# Patient Record
Sex: Male | Born: 1950
Health system: Southern US, Community
[De-identification: ages and names within clinical notes are randomized; demographics above are authoritative.]

## PROBLEM LIST (undated history)

## (undated) DIAGNOSIS — K219 Gastro-esophageal reflux disease without esophagitis: Secondary | ICD-10-CM

## (undated) DIAGNOSIS — I82409 Acute embolism and thrombosis of unspecified deep veins of unspecified lower extremity: Secondary | ICD-10-CM

## (undated) DIAGNOSIS — D751 Secondary polycythemia: Secondary | ICD-10-CM

## (undated) DIAGNOSIS — D689 Coagulation defect, unspecified: Secondary | ICD-10-CM

## (undated) DIAGNOSIS — D649 Anemia, unspecified: Secondary | ICD-10-CM

## (undated) DIAGNOSIS — G248 Other dystonia: Secondary | ICD-10-CM

## (undated) DIAGNOSIS — D45 Polycythemia vera: Secondary | ICD-10-CM

## (undated) DIAGNOSIS — K255 Chronic or unspecified gastric ulcer with perforation: Secondary | ICD-10-CM

## (undated) DIAGNOSIS — A419 Sepsis, unspecified organism: Secondary | ICD-10-CM

## (undated) DIAGNOSIS — K635 Polyp of colon: Secondary | ICD-10-CM

## (undated) DIAGNOSIS — K631 Perforation of intestine (nontraumatic): Secondary | ICD-10-CM

## (undated) HISTORY — DX: Perforation of intestine (nontraumatic): K63.1

## (undated) HISTORY — PX: HERNIA REPAIR: SHX51

## (undated) HISTORY — DX: Polyp of colon: K63.5

## (undated) HISTORY — DX: Coagulation defect, unspecified: D68.9

## (undated) HISTORY — DX: Gastro-esophageal reflux disease without esophagitis: K21.9

## (undated) HISTORY — DX: Other dystonia: G24.8

## (undated) HISTORY — PX: COLONOSCOPY: SHX174

## (undated) HISTORY — DX: Chronic or unspecified gastric ulcer with perforation: K25.5

## (undated) HISTORY — PX: WISDOM TOOTH EXTRACTION: SHX21

## (undated) HISTORY — DX: Polycythemia vera: D45

## (undated) HISTORY — DX: Sepsis, unspecified organism: A41.9

---

## 1996-08-24 HISTORY — PX: FINGER ARTHROPLASTY: SHX5017

## 1998-01-24 ENCOUNTER — Other Ambulatory Visit: Admission: RE | Admit: 1998-01-24 | Discharge: 1998-01-24 | Payer: Self-pay | Admitting: Specialist

## 2004-07-24 ENCOUNTER — Ambulatory Visit: Payer: Self-pay | Admitting: Hematology & Oncology

## 2004-09-25 ENCOUNTER — Ambulatory Visit: Payer: Self-pay | Admitting: Hematology & Oncology

## 2004-11-27 ENCOUNTER — Ambulatory Visit: Payer: Self-pay | Admitting: Hematology & Oncology

## 2005-04-02 ENCOUNTER — Ambulatory Visit: Payer: Self-pay | Admitting: Hematology & Oncology

## 2005-05-18 ENCOUNTER — Ambulatory Visit: Payer: Self-pay | Admitting: Hematology & Oncology

## 2005-06-19 ENCOUNTER — Ambulatory Visit: Payer: Self-pay | Admitting: Internal Medicine

## 2005-07-14 ENCOUNTER — Encounter (INDEPENDENT_AMBULATORY_CARE_PROVIDER_SITE_OTHER): Payer: Self-pay | Admitting: *Deleted

## 2005-07-14 ENCOUNTER — Ambulatory Visit: Payer: Self-pay | Admitting: Internal Medicine

## 2005-07-21 ENCOUNTER — Ambulatory Visit: Payer: Self-pay | Admitting: Hematology & Oncology

## 2005-08-24 HISTORY — PX: INGUINAL HERNIA REPAIR: SHX194

## 2005-10-14 ENCOUNTER — Ambulatory Visit: Payer: Self-pay | Admitting: Hematology & Oncology

## 2005-12-01 ENCOUNTER — Ambulatory Visit: Payer: Self-pay | Admitting: Hematology & Oncology

## 2005-12-01 LAB — CBC WITH DIFFERENTIAL/PLATELET
BASO%: 0.6 % (ref 0.0–2.0)
Eosinophils Absolute: 0.3 10*3/uL (ref 0.0–0.5)
HCT: 44.3 % (ref 38.7–49.9)
LYMPH%: 8.6 % — ABNORMAL LOW (ref 14.0–48.0)
MONO#: 0.4 10*3/uL (ref 0.1–0.9)
NEUT#: 18.8 10*3/uL — ABNORMAL HIGH (ref 1.5–6.5)
NEUT%: 87.6 % — ABNORMAL HIGH (ref 40.0–75.0)
Platelets: 420 10*3/uL — ABNORMAL HIGH (ref 145–400)
WBC: 21.5 10*3/uL — ABNORMAL HIGH (ref 4.0–10.0)
lymph#: 1.8 10*3/uL (ref 0.9–3.3)

## 2006-01-14 ENCOUNTER — Ambulatory Visit: Payer: Self-pay | Admitting: Hematology & Oncology

## 2006-01-14 LAB — CBC WITH DIFFERENTIAL/PLATELET
Basophils Absolute: 0.1 10*3/uL (ref 0.0–0.1)
Eosinophils Absolute: 0.4 10*3/uL (ref 0.0–0.5)
HCT: 45.7 % (ref 38.7–49.9)
HGB: 14.1 g/dL (ref 13.0–17.1)
LYMPH%: 11.3 % — ABNORMAL LOW (ref 14.0–48.0)
MONO#: 0.4 10*3/uL (ref 0.1–0.9)
NEUT#: 14.3 10*3/uL — ABNORMAL HIGH (ref 1.5–6.5)
Platelets: 401 10*3/uL — ABNORMAL HIGH (ref 145–400)
RBC: 7.39 10*6/uL — ABNORMAL HIGH (ref 4.20–5.71)
WBC: 17.2 10*3/uL — ABNORMAL HIGH (ref 4.0–10.0)

## 2006-01-14 LAB — CHCC SMEAR

## 2006-01-22 LAB — CBC WITH DIFFERENTIAL/PLATELET
BASO%: 0.1 % (ref 0.0–2.0)
EOS%: 2 % (ref 0.0–7.0)
HGB: 14.3 g/dL (ref 13.0–17.1)
MCH: 19 pg — ABNORMAL LOW (ref 28.0–33.4)
MCHC: 30.7 g/dL — ABNORMAL LOW (ref 32.0–35.9)
MONO#: 0.4 10*3/uL (ref 0.1–0.9)
RDW: 20.3 % — ABNORMAL HIGH (ref 11.2–14.6)
WBC: 18 10*3/uL — ABNORMAL HIGH (ref 4.0–10.0)
lymph#: 1.7 10*3/uL (ref 0.9–3.3)

## 2006-01-28 LAB — CBC WITH DIFFERENTIAL/PLATELET
Basophils Absolute: 0.1 10*3/uL (ref 0.0–0.1)
EOS%: 1.7 % (ref 0.0–7.0)
Eosinophils Absolute: 0.4 10*3/uL (ref 0.0–0.5)
HCT: 41.5 % (ref 38.7–49.9)
HGB: 12.9 g/dL — ABNORMAL LOW (ref 13.0–17.1)
MONO#: 0.5 10*3/uL (ref 0.1–0.9)
NEUT#: 17.7 10*3/uL — ABNORMAL HIGH (ref 1.5–6.5)
RDW: 20.2 % — ABNORMAL HIGH (ref 11.2–14.6)
WBC: 20.4 10*3/uL — ABNORMAL HIGH (ref 4.0–10.0)
lymph#: 1.7 10*3/uL (ref 0.9–3.3)

## 2006-04-06 ENCOUNTER — Ambulatory Visit: Payer: Self-pay | Admitting: Hematology & Oncology

## 2006-04-08 LAB — CBC WITH DIFFERENTIAL/PLATELET
BASO%: 0.8 % (ref 0.0–2.0)
EOS%: 2.6 % (ref 0.0–7.0)
LYMPH%: 10.3 % — ABNORMAL LOW (ref 14.0–48.0)
MCH: 17.9 pg — ABNORMAL LOW (ref 28.0–33.4)
MCHC: 28.6 g/dL — ABNORMAL LOW (ref 32.0–35.9)
MCV: 62.8 fL — ABNORMAL LOW (ref 81.6–98.0)
MONO%: 3.9 % (ref 0.0–13.0)
Platelets: 455 10*3/uL — ABNORMAL HIGH (ref 145–400)
RBC: 7.26 10*6/uL — ABNORMAL HIGH (ref 4.20–5.71)
RDW: 15.5 % — ABNORMAL HIGH (ref 11.2–14.6)

## 2006-04-08 LAB — TECHNOLOGIST REVIEW

## 2006-04-16 LAB — CBC WITH DIFFERENTIAL/PLATELET
BASO%: 0.9 % (ref 0.0–2.0)
Basophils Absolute: 0.2 10*3/uL — ABNORMAL HIGH (ref 0.0–0.1)
EOS%: 2.5 % (ref 0.0–7.0)
HGB: 12.6 g/dL — ABNORMAL LOW (ref 13.0–17.1)
MCH: 18.1 pg — ABNORMAL LOW (ref 28.0–33.4)
RDW: 15.4 % — ABNORMAL HIGH (ref 11.2–14.6)
WBC: 19.1 10*3/uL — ABNORMAL HIGH (ref 4.0–10.0)
lymph#: 2.2 10*3/uL (ref 0.9–3.3)

## 2006-04-22 LAB — CBC WITH DIFFERENTIAL/PLATELET
Basophils Absolute: 0.2 10*3/uL — ABNORMAL HIGH (ref 0.0–0.1)
Eosinophils Absolute: 0.5 10*3/uL (ref 0.0–0.5)
HGB: 12 g/dL — ABNORMAL LOW (ref 13.0–17.1)
MCV: 61.7 fL — ABNORMAL LOW (ref 81.6–98.0)
NEUT#: 17.4 10*3/uL — ABNORMAL HIGH (ref 1.5–6.5)
RDW: 15.5 % — ABNORMAL HIGH (ref 11.2–14.6)
lymph#: 2.4 10*3/uL (ref 0.9–3.3)

## 2006-04-29 ENCOUNTER — Ambulatory Visit (HOSPITAL_COMMUNITY): Admission: RE | Admit: 2006-04-29 | Discharge: 2006-04-29 | Payer: Self-pay | Admitting: General Surgery

## 2006-04-30 ENCOUNTER — Encounter: Admission: RE | Admit: 2006-04-30 | Discharge: 2006-04-30 | Payer: Self-pay | Admitting: General Surgery

## 2006-05-20 LAB — CBC WITH DIFFERENTIAL/PLATELET
Basophils Absolute: 0 10*3/uL (ref 0.0–0.1)
Eosinophils Absolute: 0.4 10*3/uL (ref 0.0–0.5)
HGB: 12.2 g/dL — ABNORMAL LOW (ref 13.0–17.1)
LYMPH%: 8.9 % — ABNORMAL LOW (ref 14.0–48.0)
MCV: 60.8 fL — ABNORMAL LOW (ref 81.6–98.0)
MONO%: 2.6 % (ref 0.0–13.0)
NEUT#: 16.4 10*3/uL — ABNORMAL HIGH (ref 1.5–6.5)
Platelets: 465 10*3/uL — ABNORMAL HIGH (ref 145–400)

## 2006-05-20 LAB — FERRITIN: Ferritin: 4 ng/mL — ABNORMAL LOW (ref 22–322)

## 2006-06-23 ENCOUNTER — Ambulatory Visit (HOSPITAL_COMMUNITY): Admission: RE | Admit: 2006-06-23 | Discharge: 2006-06-23 | Payer: Self-pay | Admitting: General Surgery

## 2006-06-29 ENCOUNTER — Ambulatory Visit: Payer: Self-pay | Admitting: Hematology & Oncology

## 2006-07-01 LAB — CBC WITH DIFFERENTIAL/PLATELET
BASO%: 0.9 % (ref 0.0–2.0)
Basophils Absolute: 0.3 10*3/uL — ABNORMAL HIGH (ref 0.0–0.1)
EOS%: 2.3 % (ref 0.0–7.0)
Eosinophils Absolute: 0.6 10*3/uL — ABNORMAL HIGH (ref 0.0–0.5)
HCT: 45.4 % (ref 38.7–49.9)
HGB: 13.1 g/dL (ref 13.0–17.1)
LYMPH%: 8.6 % — ABNORMAL LOW (ref 14.0–48.0)
MCH: 17.7 pg — ABNORMAL LOW (ref 28.0–33.4)
MCHC: 28.8 g/dL — ABNORMAL LOW (ref 32.0–35.9)
MCV: 61.3 fL — ABNORMAL LOW (ref 81.6–98.0)
MONO#: 0.9 10*3/uL (ref 0.1–0.9)
MONO%: 3.1 % (ref 0.0–13.0)
NEUT#: 23.5 10*3/uL — ABNORMAL HIGH (ref 1.5–6.5)
NEUT%: 85.2 % — ABNORMAL HIGH (ref 40.0–75.0)
Platelets: 656 10*3/uL — ABNORMAL HIGH (ref 145–400)
RBC: 7.4 10*6/uL — ABNORMAL HIGH (ref 4.20–5.71)
RDW: 15.6 % — ABNORMAL HIGH (ref 11.2–14.6)
WBC: 27.6 10*3/uL — ABNORMAL HIGH (ref 4.0–10.0)
lymph#: 2.4 10*3/uL (ref 0.9–3.3)

## 2006-08-12 LAB — CBC WITH DIFFERENTIAL/PLATELET
Basophils Absolute: 0 10*3/uL (ref 0.0–0.1)
EOS%: 1.9 % (ref 0.0–7.0)
Eosinophils Absolute: 0.4 10*3/uL (ref 0.0–0.5)
HCT: 43.1 % (ref 38.7–49.9)
HGB: 12.8 g/dL — ABNORMAL LOW (ref 13.0–17.1)
MCH: 18.1 pg — ABNORMAL LOW (ref 28.0–33.4)
MCV: 60.9 fL — ABNORMAL LOW (ref 81.6–98.0)
MONO%: 2.3 % (ref 0.0–13.0)
NEUT#: 16.2 10*3/uL — ABNORMAL HIGH (ref 1.5–6.5)
NEUT%: 85.3 % — ABNORMAL HIGH (ref 40.0–75.0)
RDW: 20.2 % — ABNORMAL HIGH (ref 11.2–14.6)

## 2006-09-20 ENCOUNTER — Ambulatory Visit: Payer: Self-pay | Admitting: Hematology & Oncology

## 2006-09-23 LAB — CBC WITH DIFFERENTIAL/PLATELET
Basophils Absolute: 0 10*3/uL (ref 0.0–0.1)
EOS%: 1.4 % (ref 0.0–7.0)
Eosinophils Absolute: 0.2 10*3/uL (ref 0.0–0.5)
HCT: 43.4 % (ref 38.7–49.9)
HGB: 13.3 g/dL (ref 13.0–17.1)
MCH: 18.9 pg — ABNORMAL LOW (ref 28.0–33.4)
MCV: 61.5 fL — ABNORMAL LOW (ref 81.6–98.0)
NEUT#: 15.6 10*3/uL — ABNORMAL HIGH (ref 1.5–6.5)
NEUT%: 85.9 % — ABNORMAL HIGH (ref 40.0–75.0)
RDW: 19.9 % — ABNORMAL HIGH (ref 11.2–14.6)
lymph#: 1.6 10*3/uL (ref 0.9–3.3)

## 2006-09-23 LAB — CHCC SMEAR

## 2006-10-28 LAB — CBC WITH DIFFERENTIAL/PLATELET
Basophils Absolute: 0.1 10*3/uL (ref 0.0–0.1)
Eosinophils Absolute: 0.9 10*3/uL — ABNORMAL HIGH (ref 0.0–0.5)
HCT: 42.7 % (ref 38.7–49.9)
LYMPH%: 9.3 % — ABNORMAL LOW (ref 14.0–48.0)
MCV: 61.4 fL — ABNORMAL LOW (ref 81.6–98.0)
MONO#: 0.5 10*3/uL (ref 0.1–0.9)
MONO%: 2.5 % (ref 0.0–13.0)
NEUT#: 16.6 10*3/uL — ABNORMAL HIGH (ref 1.5–6.5)
NEUT%: 83.1 % — ABNORMAL HIGH (ref 40.0–75.0)
Platelets: 455 10*3/uL — ABNORMAL HIGH (ref 145–400)
WBC: 20 10*3/uL — ABNORMAL HIGH (ref 4.0–10.0)

## 2006-12-06 ENCOUNTER — Ambulatory Visit: Payer: Self-pay | Admitting: Hematology & Oncology

## 2006-12-09 LAB — CBC WITH DIFFERENTIAL/PLATELET
Basophils Absolute: 0 10*3/uL (ref 0.0–0.1)
HCT: 44.4 % (ref 38.7–49.9)
HGB: 14 g/dL (ref 13.0–17.1)
MCH: 19.4 pg — ABNORMAL LOW (ref 28.0–33.4)
MONO#: 0.6 10*3/uL (ref 0.1–0.9)
NEUT%: 85.8 % — ABNORMAL HIGH (ref 40.0–75.0)
WBC: 20.7 10*3/uL — ABNORMAL HIGH (ref 4.0–10.0)
lymph#: 1.9 10*3/uL (ref 0.9–3.3)

## 2007-01-14 ENCOUNTER — Ambulatory Visit: Payer: Self-pay | Admitting: Hematology & Oncology

## 2007-01-26 LAB — CBC WITH DIFFERENTIAL/PLATELET
Basophils Absolute: 0 10*3/uL (ref 0.0–0.1)
EOS%: 2.5 % (ref 0.0–7.0)
HCT: 47.8 % (ref 38.7–49.9)
HGB: 14.8 g/dL (ref 13.0–17.1)
LYMPH%: 9.4 % — ABNORMAL LOW (ref 14.0–48.0)
MCH: 19.3 pg — ABNORMAL LOW (ref 28.0–33.4)
MCV: 62.5 fL — ABNORMAL LOW (ref 81.6–98.0)
MONO%: 3.4 % (ref 0.0–13.0)
NEUT%: 84.5 % — ABNORMAL HIGH (ref 40.0–75.0)

## 2007-01-28 LAB — CBC WITH DIFFERENTIAL/PLATELET
BASO%: 0.3 % (ref 0.0–2.0)
Eosinophils Absolute: 0.5 10*3/uL (ref 0.0–0.5)
LYMPH%: 10.3 % — ABNORMAL LOW (ref 14.0–48.0)
MCHC: 31.4 g/dL — ABNORMAL LOW (ref 32.0–35.9)
MCV: 62.1 fL — ABNORMAL LOW (ref 81.6–98.0)
MONO%: 1 % (ref 0.0–13.0)
Platelets: 397 10*3/uL (ref 145–400)
RBC: 7.67 10*6/uL — ABNORMAL HIGH (ref 4.20–5.71)

## 2007-02-02 LAB — CBC WITH DIFFERENTIAL/PLATELET
BASO%: 0.4 % (ref 0.0–2.0)
EOS%: 3.2 % (ref 0.0–7.0)
LYMPH%: 8.6 % — ABNORMAL LOW (ref 14.0–48.0)
MCHC: 31 g/dL — ABNORMAL LOW (ref 32.0–35.9)
MONO#: 0.4 10*3/uL (ref 0.1–0.9)
MONO%: 2 % (ref 0.0–13.0)
Platelets: 402 10*3/uL — ABNORMAL HIGH (ref 145–400)
RBC: 6.83 10*6/uL — ABNORMAL HIGH (ref 4.20–5.71)
WBC: 17.9 10*3/uL — ABNORMAL HIGH (ref 4.0–10.0)

## 2007-03-11 ENCOUNTER — Ambulatory Visit: Payer: Self-pay | Admitting: Hematology & Oncology

## 2007-03-16 LAB — CBC WITH DIFFERENTIAL/PLATELET
BASO%: 0.9 % (ref 0.0–2.0)
HCT: 46.7 % (ref 38.7–49.9)
MCHC: 28.2 g/dL — ABNORMAL LOW (ref 32.0–35.9)
MONO#: 0.7 10*3/uL (ref 0.1–0.9)
RBC: 7.2 10*6/uL — ABNORMAL HIGH (ref 4.20–5.71)
RDW: 13.9 % (ref 11.2–14.6)
WBC: 21.1 10*3/uL — ABNORMAL HIGH (ref 4.0–10.0)
lymph#: 2 10*3/uL (ref 0.9–3.3)

## 2007-03-16 LAB — TECHNOLOGIST REVIEW

## 2007-03-31 LAB — CBC WITH DIFFERENTIAL/PLATELET
BASO%: 0.3 % (ref 0.0–2.0)
Basophils Absolute: 0.1 10e3/uL (ref 0.0–0.1)
EOS%: 2.7 % (ref 0.0–7.0)
Eosinophils Absolute: 0.5 10e3/uL (ref 0.0–0.5)
HCT: 44 % (ref 38.7–49.9)
HGB: 13.4 g/dL (ref 13.0–17.1)
LYMPH%: 10.4 % — ABNORMAL LOW (ref 14.0–48.0)
MCH: 19 pg — ABNORMAL LOW (ref 28.0–33.4)
MCHC: 30.5 g/dL — ABNORMAL LOW (ref 32.0–35.9)
MCV: 62.2 fL — ABNORMAL LOW (ref 81.6–98.0)
MONO#: 0.5 10e3/uL (ref 0.1–0.9)
MONO%: 2.6 % (ref 0.0–13.0)
NEUT#: 16.5 10e3/uL — ABNORMAL HIGH (ref 1.5–6.5)
NEUT%: 84 % — ABNORMAL HIGH (ref 40.0–75.0)
Platelets: 419 10e3/uL — ABNORMAL HIGH (ref 145–400)
RBC: 7.08 10e6/uL — ABNORMAL HIGH (ref 4.20–5.71)
RDW: 19.2 % — ABNORMAL HIGH (ref 11.2–14.6)
WBC: 19.6 10e3/uL — ABNORMAL HIGH (ref 4.0–10.0)
lymph#: 2 10e3/uL (ref 0.9–3.3)

## 2007-04-27 ENCOUNTER — Ambulatory Visit: Payer: Self-pay | Admitting: Hematology & Oncology

## 2007-04-27 LAB — CBC WITH DIFFERENTIAL/PLATELET
BASO%: 0.6 % (ref 0.0–2.0)
Basophils Absolute: 0.1 10*3/uL (ref 0.0–0.1)
Eosinophils Absolute: 0.4 10*3/uL (ref 0.0–0.5)
HCT: 44.8 % (ref 38.7–49.9)
HGB: 14 g/dL (ref 13.0–17.1)
LYMPH%: 8.2 % — ABNORMAL LOW (ref 14.0–48.0)
MCHC: 31.3 g/dL — ABNORMAL LOW (ref 32.0–35.9)
MONO#: 0.5 10*3/uL (ref 0.1–0.9)
NEUT#: 16.8 10*3/uL — ABNORMAL HIGH (ref 1.5–6.5)
NEUT%: 86.7 % — ABNORMAL HIGH (ref 40.0–75.0)
Platelets: 385 10*3/uL (ref 145–400)
WBC: 19.3 10*3/uL — ABNORMAL HIGH (ref 4.0–10.0)
lymph#: 1.6 10*3/uL (ref 0.9–3.3)

## 2007-06-06 LAB — CBC WITH DIFFERENTIAL/PLATELET
BASO%: 0.3 % (ref 0.0–2.0)
Basophils Absolute: 0.1 10*3/uL (ref 0.0–0.1)
EOS%: 1.9 % (ref 0.0–7.0)
HCT: 45.6 % (ref 38.7–49.9)
HGB: 14.2 g/dL (ref 13.0–17.1)
LYMPH%: 9 % — ABNORMAL LOW (ref 14.0–48.0)
MCH: 19.2 pg — ABNORMAL LOW (ref 28.0–33.4)
MCHC: 31.1 g/dL — ABNORMAL LOW (ref 32.0–35.9)
MCV: 61.7 fL — ABNORMAL LOW (ref 81.6–98.0)
NEUT%: 86.3 % — ABNORMAL HIGH (ref 40.0–75.0)
Platelets: 416 10*3/uL — ABNORMAL HIGH (ref 145–400)
lymph#: 1.9 10*3/uL (ref 0.9–3.3)

## 2007-06-06 LAB — FERRITIN: Ferritin: 7 ng/mL — ABNORMAL LOW (ref 22–322)

## 2007-07-15 ENCOUNTER — Ambulatory Visit: Payer: Self-pay | Admitting: Hematology & Oncology

## 2007-07-19 LAB — CBC WITH DIFFERENTIAL/PLATELET
EOS%: 2.3 % (ref 0.0–7.0)
Eosinophils Absolute: 0.5 10*3/uL (ref 0.0–0.5)
MCH: 19.1 pg — ABNORMAL LOW (ref 28.0–33.4)
MCV: 61.6 fL — ABNORMAL LOW (ref 81.6–98.0)
MONO%: 2.8 % (ref 0.0–13.0)
NEUT#: 18 10*3/uL — ABNORMAL HIGH (ref 1.5–6.5)
RBC: 7.55 10*6/uL — ABNORMAL HIGH (ref 4.20–5.71)
RDW: 20 % — ABNORMAL HIGH (ref 11.2–14.6)

## 2007-08-11 LAB — CBC WITH DIFFERENTIAL/PLATELET
Eosinophils Absolute: 0.5 10*3/uL (ref 0.0–0.5)
MONO#: 0.6 10*3/uL (ref 0.1–0.9)
NEUT#: 16.3 10*3/uL — ABNORMAL HIGH (ref 1.5–6.5)
RBC: 6.86 10*6/uL — ABNORMAL HIGH (ref 4.20–5.71)
RDW: 14.2 % (ref 11.2–14.6)
WBC: 19.4 10*3/uL — ABNORMAL HIGH (ref 4.0–10.0)
lymph#: 1.9 10*3/uL (ref 0.9–3.3)

## 2007-08-11 LAB — CHCC SMEAR

## 2007-08-25 HISTORY — PX: INGUINAL HERNIA REPAIR: SHX194

## 2007-09-05 ENCOUNTER — Ambulatory Visit: Payer: Self-pay | Admitting: Hematology & Oncology

## 2007-09-07 LAB — CBC WITH DIFFERENTIAL/PLATELET
BASO%: 0.3 % (ref 0.0–2.0)
Basophils Absolute: 0.1 10*3/uL (ref 0.0–0.1)
HCT: 45 % (ref 38.7–49.9)
HGB: 12.7 g/dL — ABNORMAL LOW (ref 13.0–17.1)
MCHC: 28.2 g/dL — ABNORMAL LOW (ref 32.0–35.9)
MONO#: 0.8 10*3/uL (ref 0.1–0.9)
NEUT%: 83.7 % — ABNORMAL HIGH (ref 40.0–75.0)
WBC: 21.3 10*3/uL — ABNORMAL HIGH (ref 4.0–10.0)
lymph#: 2.2 10*3/uL (ref 0.9–3.3)

## 2007-09-07 LAB — CHCC SMEAR

## 2007-10-27 ENCOUNTER — Ambulatory Visit: Payer: Self-pay | Admitting: Hematology & Oncology

## 2007-10-31 LAB — CBC WITH DIFFERENTIAL/PLATELET
Basophils Absolute: 0.1 10*3/uL (ref 0.0–0.1)
Eosinophils Absolute: 0.4 10*3/uL (ref 0.0–0.5)
HCT: 39.5 % (ref 38.7–49.9)
HGB: 11.8 g/dL — ABNORMAL LOW (ref 13.0–17.1)
MONO#: 0.4 10*3/uL (ref 0.1–0.9)
NEUT#: 18.3 10*3/uL — ABNORMAL HIGH (ref 1.5–6.5)
NEUT%: 85.5 % — ABNORMAL HIGH (ref 40.0–75.0)
RDW: 16.6 % — ABNORMAL HIGH (ref 11.2–14.6)
WBC: 21.4 10*3/uL — ABNORMAL HIGH (ref 4.0–10.0)
lymph#: 2.1 10*3/uL (ref 0.9–3.3)

## 2007-12-12 ENCOUNTER — Ambulatory Visit: Payer: Self-pay | Admitting: Hematology & Oncology

## 2007-12-14 LAB — CBC WITH DIFFERENTIAL/PLATELET
BASO%: 0.1 % (ref 0.0–2.0)
Eosinophils Absolute: 0.3 10*3/uL (ref 0.0–0.5)
LYMPH%: 8.6 % — ABNORMAL LOW (ref 14.0–48.0)
MONO#: 0.5 10*3/uL (ref 0.1–0.9)
NEUT#: 16.6 10*3/uL — ABNORMAL HIGH (ref 1.5–6.5)
Platelets: 402 10*3/uL — ABNORMAL HIGH (ref 145–400)
RBC: 6.54 10*6/uL — ABNORMAL HIGH (ref 4.20–5.71)
RDW: 21.1 % — ABNORMAL HIGH (ref 11.2–14.6)
WBC: 19.1 10*3/uL — ABNORMAL HIGH (ref 4.0–10.0)
lymph#: 1.7 10*3/uL (ref 0.9–3.3)

## 2007-12-14 LAB — CHCC SMEAR

## 2008-02-09 ENCOUNTER — Ambulatory Visit: Payer: Self-pay | Admitting: Hematology & Oncology

## 2008-02-14 LAB — CBC WITH DIFFERENTIAL/PLATELET
Basophils Absolute: 0.1 10*3/uL (ref 0.0–0.1)
Eosinophils Absolute: 0.3 10*3/uL (ref 0.0–0.5)
HGB: 12.5 g/dL — ABNORMAL LOW (ref 13.0–17.1)
LYMPH%: 8.1 % — ABNORMAL LOW (ref 14.0–48.0)
MCV: 59.6 fL — ABNORMAL LOW (ref 81.6–98.0)
MONO%: 3 % (ref 0.0–13.0)
NEUT#: 17 10*3/uL — ABNORMAL HIGH (ref 1.5–6.5)
Platelets: 367 10*3/uL (ref 145–400)
RBC: 6.81 10*6/uL — ABNORMAL HIGH (ref 4.20–5.71)

## 2008-04-06 ENCOUNTER — Ambulatory Visit: Payer: Self-pay | Admitting: Hematology & Oncology

## 2008-04-11 LAB — CBC WITH DIFFERENTIAL (CANCER CENTER ONLY)
BASO%: 0.8 % (ref 0.0–2.0)
LYMPH%: 10.7 % — ABNORMAL LOW (ref 14.0–48.0)
MCH: 18.5 pg — ABNORMAL LOW (ref 28.0–33.4)
MCV: 60 fL — ABNORMAL LOW (ref 82–98)
MONO#: 0.9 10*3/uL (ref 0.1–0.9)
MONO%: 4.1 % (ref 0.0–13.0)
Platelets: 349 10*3/uL (ref 145–400)
RDW: 18.7 % — ABNORMAL HIGH (ref 10.5–14.6)
WBC: 20.6 10*3/uL — ABNORMAL HIGH (ref 4.0–10.0)

## 2008-06-25 ENCOUNTER — Ambulatory Visit: Payer: Self-pay | Admitting: Hematology & Oncology

## 2008-06-27 LAB — CBC WITH DIFFERENTIAL/PLATELET
BASO%: 0.9 % (ref 0.0–2.0)
HCT: 44.9 % (ref 38.7–49.9)
LYMPH%: 9.6 % — ABNORMAL LOW (ref 14.0–48.0)
MCHC: 29.7 g/dL — ABNORMAL LOW (ref 32.0–35.9)
MCV: 61.2 fL — ABNORMAL LOW (ref 81.6–98.0)
MONO#: 0.7 10*3/uL (ref 0.1–0.9)
NEUT%: 84.2 % — ABNORMAL HIGH (ref 40.0–75.0)
Platelets: 374 10*3/uL (ref 145–400)
WBC: 20.3 10*3/uL — ABNORMAL HIGH (ref 4.0–10.0)

## 2008-07-11 ENCOUNTER — Ambulatory Visit: Payer: Self-pay | Admitting: Hematology & Oncology

## 2008-09-12 ENCOUNTER — Ambulatory Visit: Payer: Self-pay | Admitting: Hematology & Oncology

## 2008-09-13 LAB — CBC WITH DIFFERENTIAL (CANCER CENTER ONLY)
EOS%: 2.6 % (ref 0.0–7.0)
Eosinophils Absolute: 0.4 10*3/uL (ref 0.0–0.5)
LYMPH%: 11.1 % — ABNORMAL LOW (ref 14.0–48.0)
MCH: 18.5 pg — ABNORMAL LOW (ref 28.0–33.4)
MCHC: 30.6 g/dL — ABNORMAL LOW (ref 32.0–35.9)
MCV: 60 fL — ABNORMAL LOW (ref 82–98)
MONO%: 4.1 % (ref 0.0–13.0)
Platelets: 333 10*3/uL (ref 145–400)
RBC: 6.77 10*6/uL — ABNORMAL HIGH (ref 4.20–5.70)

## 2008-09-13 LAB — TECHNOLOGIST REVIEW CHCC SATELLITE

## 2008-11-07 ENCOUNTER — Ambulatory Visit: Payer: Self-pay | Admitting: Hematology & Oncology

## 2008-11-07 LAB — CBC WITH DIFFERENTIAL (CANCER CENTER ONLY)
BASO#: 0.2 10*3/uL (ref 0.0–0.2)
EOS%: 3.4 % (ref 0.0–7.0)
Eosinophils Absolute: 0.6 10*3/uL — ABNORMAL HIGH (ref 0.0–0.5)
HCT: 47 % (ref 38.7–49.9)
HGB: 13.8 g/dL (ref 13.0–17.1)
LYMPH#: 1.6 10*3/uL (ref 0.9–3.3)
MCHC: 29.3 g/dL — ABNORMAL LOW (ref 32.0–35.9)
MONO#: 0.6 10*3/uL (ref 0.1–0.9)
NEUT%: 83.6 % — ABNORMAL HIGH (ref 40.0–80.0)
RBC: 7.41 10*6/uL — ABNORMAL HIGH (ref 4.20–5.70)

## 2008-11-07 LAB — TECHNOLOGIST REVIEW CHCC SATELLITE

## 2009-01-11 ENCOUNTER — Ambulatory Visit: Payer: Self-pay | Admitting: Hematology & Oncology

## 2009-01-16 LAB — CBC WITH DIFFERENTIAL (CANCER CENTER ONLY)
MCV: 62 fL — ABNORMAL LOW (ref 82–98)
Platelets: 321 10*3/uL (ref 145–400)
RBC: 6.87 10*6/uL — ABNORMAL HIGH (ref 4.20–5.70)
WBC: 19.7 10*3/uL — ABNORMAL HIGH (ref 4.0–10.0)

## 2009-01-16 LAB — MANUAL DIFFERENTIAL (CHCC SATELLITE)
ALC: 2.4 10*3/uL (ref 0.9–3.3)
BASO: 1 % (ref 0–2)
Eos: 1 % (ref 0–7)
LYMPH: 12 % — ABNORMAL LOW (ref 14–48)
SEG: 83 % — ABNORMAL HIGH (ref 40–75)

## 2009-01-16 LAB — FERRITIN: Ferritin: 6 ng/mL — ABNORMAL LOW (ref 22–322)

## 2009-03-08 ENCOUNTER — Ambulatory Visit: Payer: Self-pay | Admitting: Hematology & Oncology

## 2009-03-15 LAB — CBC WITH DIFFERENTIAL (CANCER CENTER ONLY)
BASO%: 0.8 % (ref 0.0–2.0)
Eosinophils Absolute: 0.4 10*3/uL (ref 0.0–0.5)
MONO#: 0.6 10*3/uL (ref 0.1–0.9)
MONO%: 5.2 % (ref 0.0–13.0)
NEUT#: 9 10*3/uL — ABNORMAL HIGH (ref 1.5–6.5)
Platelets: 298 10*3/uL (ref 145–400)
RBC: 7.15 10*6/uL — ABNORMAL HIGH (ref 4.20–5.70)
WBC: 12 10*3/uL — ABNORMAL HIGH (ref 4.0–10.0)

## 2009-05-10 ENCOUNTER — Ambulatory Visit: Payer: Self-pay | Admitting: Hematology & Oncology

## 2009-05-13 LAB — CBC WITH DIFFERENTIAL (CANCER CENTER ONLY)
EOS%: 2.4 % (ref 0.0–7.0)
MCH: 19.3 pg — ABNORMAL LOW (ref 28.0–33.4)
MCHC: 31 g/dL — ABNORMAL LOW (ref 32.0–35.9)
MONO%: 4.2 % (ref 0.0–13.0)
NEUT#: 14 10*3/uL — ABNORMAL HIGH (ref 1.5–6.5)
Platelets: 292 10*3/uL (ref 145–400)
RDW: 18.4 % — ABNORMAL HIGH (ref 10.5–14.6)

## 2009-05-13 LAB — CHCC SATELLITE - SMEAR

## 2009-07-05 ENCOUNTER — Ambulatory Visit: Payer: Self-pay | Admitting: Hematology & Oncology

## 2009-07-08 LAB — CBC WITH DIFFERENTIAL (CANCER CENTER ONLY)
BASO#: 0.2 10*3/uL (ref 0.0–0.2)
EOS%: 2.8 % (ref 0.0–7.0)
HCT: 42.9 % (ref 38.7–49.9)
HGB: 13.5 g/dL (ref 13.0–17.1)
MCH: 19.5 pg — ABNORMAL LOW (ref 28.0–33.4)
MCHC: 31.5 g/dL — ABNORMAL LOW (ref 32.0–35.9)
MONO%: 4 % (ref 0.0–13.0)
NEUT#: 13.8 10*3/uL — ABNORMAL HIGH (ref 1.5–6.5)
NEUT%: 81.3 % — ABNORMAL HIGH (ref 40.0–80.0)

## 2009-07-08 LAB — TECHNOLOGIST REVIEW CHCC SATELLITE

## 2009-09-06 ENCOUNTER — Ambulatory Visit: Payer: Self-pay | Admitting: Hematology & Oncology

## 2009-09-09 LAB — CBC WITH DIFFERENTIAL (CANCER CENTER ONLY)
BASO#: 0.1 10*3/uL (ref 0.0–0.2)
BASO%: 0.7 % (ref 0.0–2.0)
EOS%: 2.4 % (ref 0.0–7.0)
Eosinophils Absolute: 0.4 10*3/uL (ref 0.0–0.5)
HCT: 49.7 % (ref 38.7–49.9)
HGB: 15.1 g/dL (ref 13.0–17.1)
LYMPH#: 1.7 10*3/uL (ref 0.9–3.3)
LYMPH%: 10.3 % — ABNORMAL LOW (ref 14.0–48.0)
MCH: 19.6 pg — ABNORMAL LOW (ref 28.0–33.4)
MCHC: 30.4 g/dL — ABNORMAL LOW (ref 32.0–35.9)
MCV: 64 fL — ABNORMAL LOW (ref 82–98)
MONO#: 0.5 10*3/uL (ref 0.1–0.9)
MONO%: 3.3 % (ref 0.0–13.0)
NEUT#: 13.5 10*3/uL — ABNORMAL HIGH (ref 1.5–6.5)
NEUT%: 83.3 % — ABNORMAL HIGH (ref 40.0–80.0)
Platelets: 330 10*3/uL (ref 145–400)
RBC: 7.71 10*6/uL — ABNORMAL HIGH (ref 4.20–5.70)
RDW: 17.9 % — ABNORMAL HIGH (ref 10.5–14.6)
WBC: 16.2 10*3/uL — ABNORMAL HIGH (ref 4.0–10.0)

## 2009-09-09 LAB — CHCC SATELLITE - SMEAR

## 2009-09-09 LAB — TECHNOLOGIST REVIEW CHCC SATELLITE

## 2009-09-13 LAB — FERRITIN: Ferritin: 12 ng/mL — ABNORMAL LOW (ref 22–322)

## 2009-09-13 LAB — JAK2 GENOTYPR: JAK2 GenotypR: DETECTED

## 2009-11-21 ENCOUNTER — Ambulatory Visit: Payer: Self-pay | Admitting: Hematology & Oncology

## 2009-11-22 LAB — FERRITIN: Ferritin: 10 ng/mL — ABNORMAL LOW (ref 22–322)

## 2009-11-22 LAB — CBC WITH DIFFERENTIAL (CANCER CENTER ONLY)
BASO#: 0.1 10*3/uL (ref 0.0–0.2)
Eosinophils Absolute: 0.4 10*3/uL (ref 0.0–0.5)
HCT: 49.1 % (ref 38.7–49.9)
HGB: 14.6 g/dL (ref 13.0–17.1)
LYMPH#: 1.7 10*3/uL (ref 0.9–3.3)
MCH: 19.3 pg — ABNORMAL LOW (ref 28.0–33.4)
NEUT#: 13.9 10*3/uL — ABNORMAL HIGH (ref 1.5–6.5)
NEUT%: 83.1 % — ABNORMAL HIGH (ref 40.0–80.0)
RBC: 7.58 10*6/uL — ABNORMAL HIGH (ref 4.20–5.70)

## 2009-11-22 LAB — TECHNOLOGIST REVIEW CHCC SATELLITE

## 2010-01-02 ENCOUNTER — Ambulatory Visit: Payer: Self-pay | Admitting: Hematology & Oncology

## 2010-01-03 LAB — FERRITIN: Ferritin: 21 ng/mL — ABNORMAL LOW (ref 22–322)

## 2010-01-03 LAB — CBC WITH DIFFERENTIAL (CANCER CENTER ONLY)
BASO#: 0.2 10*3/uL (ref 0.0–0.2)
Eosinophils Absolute: 0.6 10*3/uL — ABNORMAL HIGH (ref 0.0–0.5)
HCT: 49.6 % (ref 38.7–49.9)
HGB: 14.9 g/dL (ref 13.0–17.1)
MCH: 19.5 pg — ABNORMAL LOW (ref 28.0–33.4)
MCHC: 30 g/dL — ABNORMAL LOW (ref 32.0–35.9)
MONO%: 4 % (ref 0.0–13.0)
NEUT#: 16.2 10*3/uL — ABNORMAL HIGH (ref 1.5–6.5)
NEUT%: 83 % — ABNORMAL HIGH (ref 40.0–80.0)
RBC: 7.62 10*6/uL — ABNORMAL HIGH (ref 4.20–5.70)

## 2010-01-03 LAB — TECHNOLOGIST REVIEW CHCC SATELLITE

## 2010-02-13 ENCOUNTER — Ambulatory Visit: Payer: Self-pay | Admitting: Hematology & Oncology

## 2010-02-14 LAB — CBC WITH DIFFERENTIAL (CANCER CENTER ONLY)
BASO#: 0.2 10*3/uL (ref 0.0–0.2)
Eosinophils Absolute: 0.5 10*3/uL (ref 0.0–0.5)
LYMPH%: 9.6 % — ABNORMAL LOW (ref 14.0–48.0)
MCH: 20.1 pg — ABNORMAL LOW (ref 28.0–33.4)
MCHC: 30.6 g/dL — ABNORMAL LOW (ref 32.0–35.9)
MCV: 66 fL — ABNORMAL LOW (ref 82–98)
MONO%: 4.2 % (ref 0.0–13.0)
Platelets: 325 10*3/uL (ref 145–400)
RBC: 7.53 10*6/uL — ABNORMAL HIGH (ref 4.20–5.70)

## 2010-02-14 LAB — CHCC SATELLITE - SMEAR

## 2010-02-14 LAB — TECHNOLOGIST REVIEW CHCC SATELLITE

## 2010-04-03 ENCOUNTER — Ambulatory Visit: Payer: Self-pay | Admitting: Hematology & Oncology

## 2010-04-04 LAB — CBC WITH DIFFERENTIAL (CANCER CENTER ONLY)
EOS%: 2.5 % (ref 0.0–7.0)
Eosinophils Absolute: 0.4 10*3/uL (ref 0.0–0.5)
LYMPH%: 10.3 % — ABNORMAL LOW (ref 14.0–48.0)
MCH: 20 pg — ABNORMAL LOW (ref 28.0–33.4)
MCHC: 30.4 g/dL — ABNORMAL LOW (ref 32.0–35.9)
MCV: 66 fL — ABNORMAL LOW (ref 82–98)
MONO%: 4 % (ref 0.0–13.0)
Platelets: 319 10*3/uL (ref 145–400)
RBC: 7.35 10*6/uL — ABNORMAL HIGH (ref 4.20–5.70)
RDW: 17.2 % — ABNORMAL HIGH (ref 10.5–14.6)

## 2010-04-04 LAB — CHCC SATELLITE - SMEAR

## 2010-05-22 ENCOUNTER — Ambulatory Visit: Payer: Self-pay | Admitting: Hematology & Oncology

## 2010-05-23 LAB — CBC WITH DIFFERENTIAL (CANCER CENTER ONLY)
BASO#: 0.1 10*3/uL (ref 0.0–0.2)
BASO%: 0.8 % (ref 0.0–2.0)
EOS%: 2.8 % (ref 0.0–7.0)
HCT: 44 % (ref 38.7–49.9)
HGB: 13.3 g/dL (ref 13.0–17.1)
LYMPH%: 11 % — ABNORMAL LOW (ref 14.0–48.0)
MCH: 19.5 pg — ABNORMAL LOW (ref 28.0–33.4)
MCHC: 30.4 g/dL — ABNORMAL LOW (ref 32.0–35.9)
MCV: 64 fL — ABNORMAL LOW (ref 82–98)
MONO%: 4.6 % (ref 0.0–13.0)
NEUT%: 80.8 % — ABNORMAL HIGH (ref 40.0–80.0)
RDW: 16.1 % — ABNORMAL HIGH (ref 10.5–14.6)

## 2010-06-05 ENCOUNTER — Encounter: Payer: Self-pay | Admitting: Internal Medicine

## 2010-06-27 ENCOUNTER — Ambulatory Visit: Payer: Self-pay | Admitting: Hematology & Oncology

## 2010-06-27 LAB — CBC WITH DIFFERENTIAL (CANCER CENTER ONLY)
BASO#: 0.1 10*3/uL (ref 0.0–0.2)
Eosinophils Absolute: 0.4 10*3/uL (ref 0.0–0.5)
HGB: 14.1 g/dL (ref 13.0–17.1)
LYMPH#: 1.7 10*3/uL (ref 0.9–3.3)
MONO#: 0.8 10*3/uL (ref 0.1–0.9)
NEUT#: 14 10*3/uL — ABNORMAL HIGH (ref 1.5–6.5)
RBC: 7.26 10*6/uL — ABNORMAL HIGH (ref 4.20–5.70)

## 2010-06-27 LAB — TECHNOLOGIST REVIEW CHCC SATELLITE

## 2010-06-27 LAB — CHCC SATELLITE - SMEAR

## 2010-06-27 LAB — FERRITIN: Ferritin: 12 ng/mL — ABNORMAL LOW (ref 22–322)

## 2010-06-30 LAB — CBC WITH DIFFERENTIAL (CANCER CENTER ONLY)
BASO%: 0.9 % (ref 0.0–2.0)
EOS%: 2.3 % (ref 0.0–7.0)
LYMPH#: 1.8 10*3/uL (ref 0.9–3.3)
MCHC: 29.6 g/dL — ABNORMAL LOW (ref 32.0–35.9)
NEUT#: 15.7 10*3/uL — ABNORMAL HIGH (ref 1.5–6.5)
NEUT%: 82.9 % — ABNORMAL HIGH (ref 40.0–80.0)
RDW: 17.9 % — ABNORMAL HIGH (ref 10.5–14.6)

## 2010-08-29 ENCOUNTER — Ambulatory Visit: Payer: Self-pay | Admitting: Hematology & Oncology

## 2010-09-01 LAB — CBC WITH DIFFERENTIAL (CANCER CENTER ONLY)
BASO#: 0.2 10*3/uL (ref 0.0–0.2)
BASO%: 1 % (ref 0.0–2.0)
EOS%: 2.5 % (ref 0.0–7.0)
Eosinophils Absolute: 0.5 10*3/uL (ref 0.0–0.5)
HCT: 46.8 % (ref 38.7–49.9)
HGB: 13.9 g/dL (ref 13.0–17.1)
LYMPH#: 1.9 10*3/uL (ref 0.9–3.3)
LYMPH%: 9 % — ABNORMAL LOW (ref 14.0–48.0)
MCH: 19.2 pg — ABNORMAL LOW (ref 28.0–33.4)
MCHC: 29.8 g/dL — ABNORMAL LOW (ref 32.0–35.9)
MCV: 64 fL — ABNORMAL LOW (ref 82–98)
MONO#: 0.8 10*3/uL (ref 0.1–0.9)
MONO%: 3.9 % (ref 0.0–13.0)
NEUT#: 17.4 10*3/uL — ABNORMAL HIGH (ref 1.5–6.5)
NEUT%: 83.6 % — ABNORMAL HIGH (ref 40.0–80.0)
Platelets: 349 10*3/uL (ref 145–400)
RBC: 7.27 10*6/uL — ABNORMAL HIGH (ref 4.20–5.70)
RDW: 16.7 % — ABNORMAL HIGH (ref 10.5–14.6)
WBC: 20.9 10*3/uL — ABNORMAL HIGH (ref 4.0–10.0)

## 2010-09-01 LAB — CHCC SATELLITE - SMEAR

## 2010-09-01 LAB — TECHNOLOGIST REVIEW CHCC SATELLITE

## 2010-09-01 LAB — FERRITIN: Ferritin: 10 ng/mL — ABNORMAL LOW (ref 22–322)

## 2010-09-23 NOTE — Letter (Signed)
Summary: Colonoscopy Letter  [facility]  [address] [place], [place] [address]   Phone: [phone]  Fax: [phone]      June 05, 2010 MRN: [id number]   DURWIN DAVISSON [address] [place], [place]  [address]   Dear Mr. Hackmann,   According to your medical record, it is time for you to schedule a Colonoscopy. The American Cancer Society recommends this procedure as a method to detect early colon cancer. Patients with a family history of colon cancer, or a personal history of colon polyps or inflammatory bowel disease are at increased risk.  This letter has been generated based on the recommendations made at the time of your procedure. If you feel that in your particular situation this may no longer apply, please contact our office.  Please call our office at [phone] to schedule this appointment or to update your records at your earliest convenience.  Thank you for cooperating with [place] to provide you with the very best care possible.   Sincerely,  Wilhemina Bonito. Marina Goodell, M.D.  [facility] [phone]

## 2010-10-13 ENCOUNTER — Other Ambulatory Visit: Payer: Self-pay | Admitting: Hematology & Oncology

## 2010-10-13 ENCOUNTER — Encounter (HOSPITAL_BASED_OUTPATIENT_CLINIC_OR_DEPARTMENT_OTHER): Payer: BC Managed Care – PPO | Admitting: Hematology & Oncology

## 2010-10-13 DIAGNOSIS — D45 Polycythemia vera: Secondary | ICD-10-CM

## 2010-10-13 LAB — CBC WITH DIFFERENTIAL (CANCER CENTER ONLY)
BASO#: 0.2 10*3/uL (ref 0.0–0.2)
EOS%: 2.3 % (ref 0.0–7.0)
Eosinophils Absolute: 0.5 10*3/uL (ref 0.0–0.5)
HGB: 14.1 g/dL (ref 13.0–17.1)
LYMPH%: 9.3 % — ABNORMAL LOW (ref 14.0–48.0)
MCH: 19.6 pg — ABNORMAL LOW (ref 28.0–33.4)
MCHC: 30.3 g/dL — ABNORMAL LOW (ref 32.0–35.9)
MCV: 65 fL — ABNORMAL LOW (ref 82–98)
MONO%: 4.8 % (ref 0.0–13.0)
NEUT#: 17.5 10*3/uL — ABNORMAL HIGH (ref 1.5–6.5)
RBC: 7.18 10*6/uL — ABNORMAL HIGH (ref 4.20–5.70)

## 2010-10-16 ENCOUNTER — Encounter (HOSPITAL_BASED_OUTPATIENT_CLINIC_OR_DEPARTMENT_OTHER): Payer: BC Managed Care – PPO | Admitting: Hematology & Oncology

## 2010-10-16 ENCOUNTER — Other Ambulatory Visit: Payer: Self-pay | Admitting: Hematology & Oncology

## 2010-10-16 DIAGNOSIS — D45 Polycythemia vera: Secondary | ICD-10-CM

## 2010-10-16 LAB — CBC WITH DIFFERENTIAL (CANCER CENTER ONLY)
BASO%: 0.7 % (ref 0.0–2.0)
EOS%: 2.2 % (ref 0.0–7.0)
LYMPH%: 8.9 % — ABNORMAL LOW (ref 14.0–48.0)
MCV: 65 fL — ABNORMAL LOW (ref 82–98)
MONO#: 0.8 10*3/uL (ref 0.1–0.9)
MONO%: 4.3 % (ref 0.0–13.0)
Platelets: 335 10*3/uL (ref 145–400)
RDW: 17.1 % — ABNORMAL HIGH (ref 10.5–14.6)
WBC: 19.3 10*3/uL — ABNORMAL HIGH (ref 4.0–10.0)

## 2010-11-24 ENCOUNTER — Other Ambulatory Visit: Payer: Self-pay | Admitting: Hematology & Oncology

## 2010-11-24 ENCOUNTER — Encounter (HOSPITAL_BASED_OUTPATIENT_CLINIC_OR_DEPARTMENT_OTHER): Payer: BC Managed Care – PPO | Admitting: Hematology & Oncology

## 2010-11-24 DIAGNOSIS — Z7982 Long term (current) use of aspirin: Secondary | ICD-10-CM

## 2010-11-24 DIAGNOSIS — D45 Polycythemia vera: Secondary | ICD-10-CM

## 2010-11-24 LAB — MANUAL DIFFERENTIAL (CHCC SATELLITE)
ALC: 2.1 10*3/uL (ref 0.9–3.3)
ANC (CHCC HP manual diff): 14.6 10*3/uL — ABNORMAL HIGH (ref 1.5–6.5)
PLT EST ~~LOC~~: ADEQUATE

## 2010-11-24 LAB — CBC WITH DIFFERENTIAL (CANCER CENTER ONLY)
HGB: 12.7 g/dL — ABNORMAL LOW (ref 13.0–17.1)
MCHC: 30.8 g/dL — ABNORMAL LOW (ref 32.0–35.9)
Platelets: 369 10*3/uL (ref 145–400)
RDW: 21.4 % — ABNORMAL HIGH (ref 11.1–15.7)

## 2010-11-24 LAB — CHCC SATELLITE - SMEAR

## 2010-11-24 LAB — FERRITIN: Ferritin: 9 ng/mL — ABNORMAL LOW (ref 22–322)

## 2011-01-09 NOTE — Op Note (Signed)
NAME:  Benjamin [name], Benjamin [name] NO.:  [id number]   MEDICAL RECORD NO.:  [id number]          PATIENT TYPE:  AMB   LOCATION:  DAY                          FACILITY:  [facility]   PHYSICIAN:  Ollen Gross. Vernell Morgans, M.D. DATE OF BIRTH:  Nov 28, 1950   DATE OF PROCEDURE:  06/23/2006  DATE OF DISCHARGE:                               OPERATIVE REPORT   PREOPERATIVE DIAGNOSIS:  Left inguinal hernia.   POSTOPERATIVE DIAGNOSIS:  Left indirect inguinal hernia.   PROCEDURE:  Left inguinal hernia repair with mesh.   SURGEON:  Ollen Gross. Carolynne Edouard, M.D.   ANESTHESIA:  General via LMA.   PROCEDURE:  After informed consent was obtained, the patient was brought  to the operating room and placed in the supine position on the operating  room table.  After adequate induction of general anesthesia, the  patient's left abdomen and groin were prepped with Betadine and draped  in usual sterile manner.  The left groin was then infiltrated 0.25%  Marcaine with epinephrine.  A small incision was made from the edge of  the pubic tubercle on the left towards the anterior superior iliac spine  for a distance of about 5-6 cm.  This incision was carried down through  the skin and subcutaneous tissue sharply with electrocautery.  Small  bridging vein was encountered, it was clamped with hemostats, divided  and ligated 3-0 silk ties.  The rest of the dissection was carried  sharply through the subcutaneous tissue with electrocautery until the  fascia of the external oblique was encountered.  The fascia of the  external oblique was opened along its fibers towards apex of the  external ring with a 15 blade knife and Metzenbaum scissors.  A  Weitlaner retractor was then deployed.  Blunt dissection was carried out  of the cord structures at the edge of the pubic tubercle until the cord  could be surrounded between two fingers.  A 1/2-inch Penrose drain was  then placed around the cord structures for retraction purposes.   The  cord was gently skeletonized by a combination of blunt hemostat  dissection and sharp dissection with the electrocautery.  A hernia sac  was identified and was carefully dissected in similar manner away from  the rest of the cord structures.  The hernia sac was then opened.  There  were no visceral contents within the sac.  The sac was then ligated near  its base with a 2-0 silk suture ligature.  The distal sac was excised  and the stump of the sac was allowed to retract back beneath the floor  of the canal.  The floor the canal was then reapproximated with  interrupted #1 Vicryl stitches.  The tails on the Vicryl stitch next to  the cord were then left long.  A piece of 3 x 6 ULTRAPRO mesh was chosen  and cut to fit.  The mesh was sewed inferiorly to the shelving edge of  the inguinal ligament with a running 2-0 Prolene stitch.  The portion of  the mesh that approximated the edge of the cord was then identified and  the tails of the previous Vicryl stitch were brought through the mesh at  this point and tied.  Tails were cut in the mesh lateral to this and the  tails were wrapped around the cord structures superiorly.  The mesh was  sewed to the muscular aponeurotic strength layer of the transversalis  with interrupted 2-0 Prolene vertical mattress stitches and the tails of  mesh were anchored lateral to the cord to the shelving edge of the  inguinal ligament with interrupted 2-0 Prolene stitch.  Once this was  accomplished, the mesh was in good position without any tension.  The  wound was irrigated with copious amounts of saline.  The fascia of the  external oblique was then reapproximated with a running 2-0 Vicryl  stitch.  Prior to this, during the dissection, the ilioinguinal nerve  was identified along with the cord and was clamped both proximally and  distally, divided and ligated with 3-0 silk ties.  The wound was then  infiltrated with the rest of 0.25% Marcaine.  The  subcutaneous fascia  was then closed with running 3-0 Vicryl stitch and the skin was closed a  running 4-0 Monocryl  subcuticular stitch.  Benzoin, Steri-Strips and sterile dressings were  applied.  The patient tolerated the procedure well.  At the end of the  case, all sponge, needle and instrument counts were correct.  The  patient was then awakened and taken to recovery in stable condition.      Ollen Gross. Vernell Morgans, M.D.  Electronically Signed     PST/MEDQ  D:  06/24/2006  T:  06/24/2006  Job:  [id number]

## 2011-01-14 ENCOUNTER — Encounter (HOSPITAL_BASED_OUTPATIENT_CLINIC_OR_DEPARTMENT_OTHER): Payer: BC Managed Care – PPO | Admitting: Hematology & Oncology

## 2011-01-14 ENCOUNTER — Other Ambulatory Visit: Payer: Self-pay | Admitting: Hematology & Oncology

## 2011-01-14 DIAGNOSIS — D45 Polycythemia vera: Secondary | ICD-10-CM

## 2011-01-14 LAB — CBC WITH DIFFERENTIAL (CANCER CENTER ONLY)
Eosinophils Absolute: 0.3 10*3/uL (ref 0.0–0.5)
HGB: 13.5 g/dL (ref 13.0–17.1)
MCV: 63 fL — ABNORMAL LOW (ref 82–98)
MONO#: 0.7 10*3/uL (ref 0.1–0.9)
NEUT#: 17.6 10*3/uL — ABNORMAL HIGH (ref 1.5–6.5)
Platelets: 362 10*3/uL (ref 145–400)
RBC: 7.09 10*6/uL — ABNORMAL HIGH (ref 4.20–5.70)
WBC: 20.2 10*3/uL — ABNORMAL HIGH (ref 4.0–10.0)

## 2011-01-14 LAB — CHCC SATELLITE - SMEAR

## 2011-01-14 LAB — TECHNOLOGIST REVIEW CHCC SATELLITE

## 2011-01-15 LAB — RETICULOCYTES (CHCC)
ABS Retic: 155.1 10*3/uL (ref 19.0–186.0)
RBC.: 7.05 MIL/uL — ABNORMAL HIGH (ref 4.22–5.81)

## 2011-02-18 ENCOUNTER — Other Ambulatory Visit: Payer: Self-pay | Admitting: Hematology & Oncology

## 2011-02-18 ENCOUNTER — Encounter (HOSPITAL_BASED_OUTPATIENT_CLINIC_OR_DEPARTMENT_OTHER): Payer: BC Managed Care – PPO | Admitting: Hematology & Oncology

## 2011-02-18 DIAGNOSIS — D45 Polycythemia vera: Secondary | ICD-10-CM

## 2011-02-18 DIAGNOSIS — Z7982 Long term (current) use of aspirin: Secondary | ICD-10-CM

## 2011-02-18 LAB — CBC WITH DIFFERENTIAL (CANCER CENTER ONLY)
Eosinophils Absolute: 0.3 10*3/uL (ref 0.0–0.5)
HGB: 14.1 g/dL (ref 13.0–17.1)
MCV: 63 fL — ABNORMAL LOW (ref 82–98)
MONO#: 0.8 10*3/uL (ref 0.1–0.9)
NEUT#: 18.6 10*3/uL — ABNORMAL HIGH (ref 1.5–6.5)
Platelets: 372 10*3/uL (ref 145–400)
RBC: 7.12 10*6/uL — ABNORMAL HIGH (ref 4.20–5.70)
WBC: 21.6 10*3/uL — ABNORMAL HIGH (ref 4.0–10.0)

## 2011-02-18 LAB — CHCC SATELLITE - SMEAR

## 2011-02-18 LAB — TECHNOLOGIST REVIEW CHCC SATELLITE

## 2011-04-03 ENCOUNTER — Other Ambulatory Visit: Payer: Self-pay | Admitting: Hematology & Oncology

## 2011-04-03 ENCOUNTER — Encounter (HOSPITAL_BASED_OUTPATIENT_CLINIC_OR_DEPARTMENT_OTHER): Payer: BC Managed Care – PPO | Admitting: Hematology & Oncology

## 2011-04-03 DIAGNOSIS — Z7982 Long term (current) use of aspirin: Secondary | ICD-10-CM

## 2011-04-03 DIAGNOSIS — D45 Polycythemia vera: Secondary | ICD-10-CM

## 2011-04-03 LAB — CBC WITH DIFFERENTIAL (CANCER CENTER ONLY)
BASO#: 0.1 10*3/uL (ref 0.0–0.2)
EOS%: 1.4 % (ref 0.0–7.0)
HCT: 45.3 % (ref 38.7–49.9)
HGB: 14.1 g/dL (ref 13.0–17.1)
LYMPH#: 1.7 10*3/uL (ref 0.9–3.3)
MCH: 19.9 pg — ABNORMAL LOW (ref 28.0–33.4)
MCHC: 31.1 g/dL — ABNORMAL LOW (ref 32.0–35.9)
NEUT%: 86.8 % — ABNORMAL HIGH (ref 40.0–80.0)

## 2011-04-03 LAB — LACTATE DEHYDROGENASE: LDH: 465 U/L — ABNORMAL HIGH (ref 94–250)

## 2011-05-15 ENCOUNTER — Encounter (HOSPITAL_BASED_OUTPATIENT_CLINIC_OR_DEPARTMENT_OTHER): Payer: BC Managed Care – PPO | Admitting: Hematology & Oncology

## 2011-05-15 ENCOUNTER — Other Ambulatory Visit: Payer: Self-pay | Admitting: Hematology & Oncology

## 2011-05-15 DIAGNOSIS — D45 Polycythemia vera: Secondary | ICD-10-CM

## 2011-05-15 LAB — RETICULOCYTES (CHCC): Retic Ct Pct: 2.4 % — ABNORMAL HIGH (ref 0.4–2.3)

## 2011-05-15 LAB — CHCC SATELLITE - SMEAR

## 2011-05-15 LAB — CBC WITH DIFFERENTIAL (CANCER CENTER ONLY)
BASO%: 0.5 % (ref 0.0–2.0)
LYMPH#: 1.2 10*3/uL (ref 0.9–3.3)
MONO#: 0.7 10*3/uL (ref 0.1–0.9)
Platelets: 359 10*3/uL (ref 145–400)
RDW: 22.8 % — ABNORMAL HIGH (ref 11.1–15.7)
WBC: 21.2 10*3/uL — ABNORMAL HIGH (ref 4.0–10.0)

## 2011-05-15 LAB — FERRITIN: Ferritin: 10 ng/mL — ABNORMAL LOW (ref 22–322)

## 2011-05-18 ENCOUNTER — Encounter (HOSPITAL_BASED_OUTPATIENT_CLINIC_OR_DEPARTMENT_OTHER): Payer: BC Managed Care – PPO | Admitting: Hematology & Oncology

## 2011-05-18 DIAGNOSIS — D45 Polycythemia vera: Secondary | ICD-10-CM

## 2011-07-20 ENCOUNTER — Telehealth: Payer: Self-pay | Admitting: *Deleted

## 2011-07-20 NOTE — Telephone Encounter (Signed)
Pt called scheduled 12-26 appointment from missed 06-24-11

## 2011-08-19 ENCOUNTER — Ambulatory Visit (HOSPITAL_BASED_OUTPATIENT_CLINIC_OR_DEPARTMENT_OTHER): Payer: BC Managed Care – PPO

## 2011-08-19 ENCOUNTER — Encounter: Payer: Self-pay | Admitting: Hematology & Oncology

## 2011-08-19 ENCOUNTER — Other Ambulatory Visit: Payer: Self-pay | Admitting: Hematology & Oncology

## 2011-08-19 ENCOUNTER — Ambulatory Visit (HOSPITAL_BASED_OUTPATIENT_CLINIC_OR_DEPARTMENT_OTHER): Payer: BC Managed Care – PPO | Admitting: Lab

## 2011-08-19 ENCOUNTER — Ambulatory Visit (HOSPITAL_BASED_OUTPATIENT_CLINIC_OR_DEPARTMENT_OTHER): Payer: BC Managed Care – PPO | Admitting: Hematology & Oncology

## 2011-08-19 VITALS — BP 135/71 | HR 82 | Temp 96.1°F | Ht 73.0 in | Wt 176.0 lb

## 2011-08-19 DIAGNOSIS — D45 Polycythemia vera: Secondary | ICD-10-CM

## 2011-08-19 DIAGNOSIS — Z7982 Long term (current) use of aspirin: Secondary | ICD-10-CM

## 2011-08-19 HISTORY — DX: Polycythemia vera: D45

## 2011-08-19 LAB — CBC WITH DIFFERENTIAL (CANCER CENTER ONLY)
BASO%: 0.4 % (ref 0.0–2.0)
EOS%: 1.2 % (ref 0.0–7.0)
HCT: 48 % (ref 38.7–49.9)
LYMPH#: 1 10*3/uL (ref 0.9–3.3)
LYMPH%: 5.6 % — ABNORMAL LOW (ref 14.0–48.0)
MCHC: 30.2 g/dL — ABNORMAL LOW (ref 32.0–35.9)
MCV: 67 fL — ABNORMAL LOW (ref 82–98)
MONO#: 0.5 10*3/uL (ref 0.1–0.9)
NEUT%: 90.1 % — ABNORMAL HIGH (ref 40.0–80.0)
Platelets: 316 10*3/uL (ref 145–400)
RDW: 23.3 % — ABNORMAL HIGH (ref 11.1–15.7)
WBC: 18.3 10*3/uL — ABNORMAL HIGH (ref 4.0–10.0)

## 2011-08-19 LAB — CHCC SATELLITE - SMEAR

## 2011-08-19 NOTE — Progress Notes (Signed)
This office note has been dictated.

## 2011-08-19 NOTE — Progress Notes (Signed)
CC:   [name], M.D.  DIAGNOSIS:  Polycythemia vera.  CURRENT THERAPY: 1. Phlebotomy to maintain hematocrit of less than 45%. 2. Aspirin 81 mg p.o. daily.  INTERIM HISTORY:  Mr. Hlavaty comes in for his followup.  He is doing fairly well.  He now is on testosterone cream.  Dr. Jacky Kindle put him on some testosterone cream as Mr. Sam was having some issues with respect to libido and just a low level of energy.  Since he started the testosterone cream, he is feeling better.  Mr. Vassell has had no problems with headaches.  He has had no rashes. He has had no nausea or vomiting.  There has been no change in bowel or bladder habits.  He had a nice Christmas and Thanksgiving.  His last phlebotomy was back in September.  We have made him iron deficient.  When we last saw him, his ferritin was 10.  PHYSICAL EXAMINATION:  General:  This is a well-developed, well- nourished white gentleman in no obvious distress.  Vital Signs: Temperature 96.1, pulse 82, respiratory rate 20, blood pressure 155/71. Weight is 176.  Head/Neck:  Exam shows a normocephalic and atraumatic skull.  There are no ocular or oral lesions.  He has no facial plethora. There is no adenopathy in the neck.  Thyroid is not palpable.  Lungs: Clear bilaterally.  Cardiac:  Regular rate and rhythm with a normal S1 and S2.  There are no murmurs, rubs or bruits.  Abdomen:  Soft with good bowel sounds.  There is no palpable abdominal mass.  There is no fluid wave.  There is no palpable hepatosplenomegaly.  Back:  No tenderness over the spine, ribs or hips.  Extremities:  No clubbing, cyanosis or edema.  Neurologic: Exam shows no focal neurologic deficits.  LABORATORY STUDIES:  White cell count 18, hemoglobin 14.5, hematocrit 48, platelet count 316.  MCV is 67.  IMPRESSION:  Mr. Marney is a 60 year old gentleman with polycythemia vera.  He is JAK2 POSITIVE.  We will go ahead and phlebotomize him today.  I think this  is reasonable.  I suppose that the testosterone cream that he is on could be somewhat stimulating his marrow.  However, he is feeling better overall with the cream, and quality of life is very important for him, so we just may need to be more aggressive with phlebotomizing him and getting him in more often.  I want to see him back in a month's time.  I want to make sure that we do maintain an aggressive posture with his phlebotomy program.    ______________________________ Josph Macho, M.D. PRE/MEDQ  D:  08/19/2011  T:  08/19/2011  Job:  [id number]

## 2011-08-20 NOTE — Progress Notes (Signed)
Records faxed to [facility] as requested.

## 2011-09-21 ENCOUNTER — Other Ambulatory Visit (HOSPITAL_BASED_OUTPATIENT_CLINIC_OR_DEPARTMENT_OTHER): Payer: BC Managed Care – PPO | Admitting: Lab

## 2011-09-21 ENCOUNTER — Ambulatory Visit (HOSPITAL_BASED_OUTPATIENT_CLINIC_OR_DEPARTMENT_OTHER): Payer: BC Managed Care – PPO | Admitting: Hematology & Oncology

## 2011-09-21 DIAGNOSIS — D45 Polycythemia vera: Secondary | ICD-10-CM

## 2011-09-21 LAB — CBC WITH DIFFERENTIAL (CANCER CENTER ONLY)
BASO#: 0.1 10*3/uL (ref 0.0–0.2)
EOS%: 1.3 % (ref 0.0–7.0)
Eosinophils Absolute: 0.2 10*3/uL (ref 0.0–0.5)
HGB: 13.1 g/dL (ref 13.0–17.1)
LYMPH%: 6.6 % — ABNORMAL LOW (ref 14.0–48.0)
MCH: 20 pg — ABNORMAL LOW (ref 28.0–33.4)
MCHC: 30.3 g/dL — ABNORMAL LOW (ref 32.0–35.9)
MCV: 66 fL — ABNORMAL LOW (ref 82–98)
MONO%: 3.4 % (ref 0.0–13.0)
NEUT%: 88.2 % — ABNORMAL HIGH (ref 40.0–80.0)
RBC: 6.56 10*6/uL — ABNORMAL HIGH (ref 4.20–5.70)

## 2011-09-21 NOTE — Progress Notes (Signed)
This office note has been dictated.

## 2011-09-22 NOTE — Progress Notes (Signed)
DIAGNOSIS:  Polycythemia vera-JAK2 positive.  CURRENT THERAPY: 1. Phlebotomy to maintain hematocrit less than 45%. 2. Aspirin 81 mg p.o. daily.  INTERIM HISTORY:  Benjamin [name] comes in for his followup.  He is doing well.  He continues on his testosterone replacement.  He says he feels a little better with this.  When we last saw him in late December, his hematocrit was 48.  He did get phlebotomized the 1st week in January.  He is having a problem with headaches.  He has no nausea or vomiting. He has no abdominal pain.  There has been no change in bowel or bladder habits.  We certainly have kept him iron deficient.  His last ferritin was 12 back in late December.  PHYSICAL EXAM:  General: This is a well-developed, well-nourished, white gentleman in no obvious distress.  Vital signs: 97.2, pulse 71, respiratory 18, blood pressure 120/70, and weight is 181.  Head and neck exam shows a normocephalic, atraumatic skull.  There are no ocular or oral lesions.  No palpable cervical or supraclavicular lymph nodes. Lungs are clear bilaterally.  Cardiac examination:  Regular rhythm with a normal S1 and S2.  There are no murmurs, rubs, or bruits.  Abdominal exam: Soft with good bowel sounds.  There is no palpable abdominal mass. There is no fluid wave.  There is no palpable hepatosplenomegaly. Back exam:  Shows no tenderness over the spine, ribs, or hips.  Extremities: Shows no clubbing, cyanosis or edema.  Neurological exam: Shows no focal neurological deficits.  LABORATORY STUDIES:  Show a white cell count of 16.8, hemoglobin 13.1, hematocrit  43.3, and platelet count 303.  MCV is 66.  IMPRESSION:  Benjamin [name] is a 61 year old gentleman with polycythemia vera.  We phlebotomized him to keep his hematocrit below 45%.  PLAN:  We will plan to get him back in another 6 weeks.  ADDENDUM:  I am glad to see that his on testosterone replacement has not affected his blood count as of yet.   Hopefully, it will not affect his hemoglobin and cause erythrocytosis.   ______________________________ Benjamin [name], M.D. PRE/MEDQ  D:  09/21/2011  T:  09/22/2011  Job:  [id number]

## 2011-11-06 ENCOUNTER — Other Ambulatory Visit (HOSPITAL_BASED_OUTPATIENT_CLINIC_OR_DEPARTMENT_OTHER): Payer: BC Managed Care – PPO | Admitting: Lab

## 2011-11-06 ENCOUNTER — Ambulatory Visit (HOSPITAL_BASED_OUTPATIENT_CLINIC_OR_DEPARTMENT_OTHER): Payer: BC Managed Care – PPO | Admitting: Hematology & Oncology

## 2011-11-06 VITALS — BP 144/77 | HR 86 | Temp 97.0°F | Ht 73.0 in | Wt 181.0 lb

## 2011-11-06 DIAGNOSIS — D45 Polycythemia vera: Secondary | ICD-10-CM

## 2011-11-06 LAB — TECHNOLOGIST REVIEW CHCC SATELLITE

## 2011-11-06 LAB — CBC WITH DIFFERENTIAL (CANCER CENTER ONLY)
BASO#: 0.1 10*3/uL (ref 0.0–0.2)
BASO%: 0.4 % (ref 0.0–2.0)
Eosinophils Absolute: 0.1 10*3/uL (ref 0.0–0.5)
HCT: 44.6 % (ref 38.7–49.9)
HGB: 13.6 g/dL (ref 13.0–17.1)
LYMPH#: 0.9 10*3/uL (ref 0.9–3.3)
LYMPH%: 4.6 % — ABNORMAL LOW (ref 14.0–48.0)
MCV: 66 fL — ABNORMAL LOW (ref 82–98)
MONO#: 0.6 10*3/uL (ref 0.1–0.9)
NEUT%: 91.2 % — ABNORMAL HIGH (ref 40.0–80.0)
RBC: 6.81 10*6/uL — ABNORMAL HIGH (ref 4.20–5.70)
RDW: 22.1 % — ABNORMAL HIGH (ref 11.1–15.7)
WBC: 19.1 10*3/uL — ABNORMAL HIGH (ref 4.0–10.0)

## 2011-11-06 NOTE — Progress Notes (Signed)
This office note has been dictated.

## 2011-11-09 NOTE — Progress Notes (Signed)
CC:   [name], M.D.  DIAGNOSIS:  Polycythemia vera, JAK2 positive.  CURRENT THERAPY: 1. Phlebotomy to maintain hematocrit less than 45%. 2. Aspirin 81 mg p.o. daily.  INTERVAL HISTORY:  [name] comes in for his followup.  We last saw him back in January.  Since then, he has been doing quite well.  He typically gets phlebotomized every 3-4 months.  He and his wife went up to [place] for an anniversary trip.  They had a great time.  He is having no issues health-wise.  There is no nausea or vomiting.  He has had no headache.  He has had no change in bowel or bladder habits. He has not noticed any problems with leg swelling.  There have been no rashes.  His last ferritin back in December was 12.  PHYSICAL EXAMINATION:  General Appearance:  This is a well-developed, well-nourished, white gentleman in no obvious distress.  Vital Signs: 97, pulse 86, respiratory rate 16, blood pressure 144/77.  Weight is 181.  Head and Neck Exam:  A normocephalic, atraumatic skull.  There are no ocular or oral lesions.  There are no palpable cervical or supraclavicular lymph nodes.  He has no conjunctival inflammation. Lungs:  Clear bilaterally.  Cardiac Exam:  Regular rate and rhythm with a normal S1 and S2.  There are no murmurs, rubs, or bruits.  Abdominal Exam:  Soft with good bowel sounds.  There is no palpable abdominal mass.  There is no fluid wave.  There is no palpable hepatosplenomegaly. Back Exam:  No tenderness over the spine, ribs, or hips.  Extremities: No clubbing, cyanosis, or edema.  Neurological Exam:  No focal neurological deficits.  LABORATORY STUDIES:  White cell count is 19, hemoglobin 13.6, hematocrit 44.6, platelet count 319.  MCV is 66.  IMPRESSION:  [name] is a 61 year old gentleman with polycythemia vera.  He is JAK2 positive.  He gets phlebotomized every couple months or so.  I will go ahead and plan to get him back in another month.  By then, he  likely will need to be phlebotomized.    ______________________________ Josph Macho, M.D. PRE/MEDQ  D:  11/06/2011  T:  11/06/2011  Job:  [id number]

## 2011-12-11 ENCOUNTER — Other Ambulatory Visit (HOSPITAL_BASED_OUTPATIENT_CLINIC_OR_DEPARTMENT_OTHER): Payer: BC Managed Care – PPO | Admitting: Lab

## 2011-12-11 ENCOUNTER — Ambulatory Visit (HOSPITAL_BASED_OUTPATIENT_CLINIC_OR_DEPARTMENT_OTHER): Payer: BC Managed Care – PPO | Admitting: Hematology & Oncology

## 2011-12-11 ENCOUNTER — Ambulatory Visit (HOSPITAL_BASED_OUTPATIENT_CLINIC_OR_DEPARTMENT_OTHER): Payer: BC Managed Care – PPO

## 2011-12-11 VITALS — BP 142/80 | HR 74 | Temp 97.9°F | Ht 73.0 in | Wt 178.0 lb

## 2011-12-11 VITALS — BP 136/68 | HR 81 | Temp 97.1°F

## 2011-12-11 DIAGNOSIS — D45 Polycythemia vera: Secondary | ICD-10-CM

## 2011-12-11 DIAGNOSIS — N529 Male erectile dysfunction, unspecified: Secondary | ICD-10-CM

## 2011-12-11 LAB — CBC WITH DIFFERENTIAL (CANCER CENTER ONLY)
BASO%: 0.6 % (ref 0.0–2.0)
Eosinophils Absolute: 0.2 10*3/uL (ref 0.0–0.5)
HCT: 46.5 % (ref 38.7–49.9)
HGB: 14.2 g/dL (ref 13.0–17.1)
LYMPH#: 1.1 10*3/uL (ref 0.9–3.3)
LYMPH%: 6.1 % — ABNORMAL LOW (ref 14.0–48.0)
MCV: 66 fL — ABNORMAL LOW (ref 82–98)
MONO#: 0.6 10*3/uL (ref 0.1–0.9)
NEUT%: 89 % — ABNORMAL HIGH (ref 40.0–80.0)
RBC: 7 10*6/uL — ABNORMAL HIGH (ref 4.20–5.70)
RDW: 22.6 % — ABNORMAL HIGH (ref 11.1–15.7)
WBC: 17.4 10*3/uL — ABNORMAL HIGH (ref 4.0–10.0)

## 2011-12-11 LAB — TECHNOLOGIST REVIEW CHCC SATELLITE

## 2011-12-11 MED ORDER — TADALAFIL 10 MG PO TABS: 10.0000 mg | ORAL_TABLET | Freq: Every day | ORAL | Status: DC | PRN

## 2011-12-11 NOTE — Progress Notes (Signed)
This office note has been dictated.

## 2011-12-11 NOTE — Patient Instructions (Signed)
Therapeutic Phlebotomy Therapeutic phlebotomy is the controlled removal of blood from your body for the purpose of treating a medical condition. It is similar to donating blood. Usually, about a pint (470 mL) of blood is removed. The average adult has 9 to 12 pints (4.3 to 5.7 L) of blood. Therapeutic phlebotomy may be used to treat the following medical conditions:  Hemochromatosis. This is a condition in which there is too much iron in the blood.   Polycythemia vera. This is a condition in which there are too many red cells in the blood.   Porphyria cutanea tarda. This is a disease usually passed from one generation to the next (inherited). It is a condition in which an important part of hemoglobin is not made properly. This results in the build up of abnormal amounts of porphyrins in the body.   Sickle cell disease. This is an inherited disease. It is a condition in which the red blood cells form an abnormal crescent shape rather than a round shape.  LET YOUR CAREGIVER KNOW ABOUT:  Allergies.   Medicines taken including herbs, eyedrops, over-the-counter medicines, and creams.   Use of steroids (by mouth or creams).   Previous problems with anesthetics or numbing medicine.   History of blood clots.   History of bleeding or blood problems.   Previous surgery.   Possibility of pregnancy, if this applies.  RISKS AND COMPLICATIONS This is a simple and safe procedure. Problems are unlikely. However, problems can occur and may include:  Nausea or lightheadedness.   Low blood pressure.   Soreness, bleeding, swelling, or bruising at the needle insertion site.   Infection.  BEFORE THE PROCEDURE  This is a procedure that can be done as an outpatient. Confirm the time that you need to arrive for your procedure. Confirm whether there is a need to fast or withhold any medications. It is helpful to wear clothing with sleeves that can be raised above the elbow. A blood sample may be done  to determine the amount of red blood cells or iron in your blood. Plan ahead of time to have someone drive you home after the procedure. PROCEDURE The entire procedure from preparation through recovery takes about 1 hour. The actual collection takes about 10 to 15 minutes.  A needle will be inserted into your vein.   Tubing and a collection bag will be attached to that needle.   Blood will flow through the needle and tubing into the collection bag.   You may be asked to open and close your hand slowly and continuously during the entire collection.   Once the specified amount of blood has been removed from your body, the collection bag and tubing will be clamped.   The needle will be removed.   Pressure will be held on the site of the needle insertion to stop the bleeding. Then a bandage will be placed over the needle insertion site.  AFTER THE PROCEDURE  Your recovery will be assessed and monitored. If there are no problems, as an outpatient, you should be able to go home shortly after the procedure.  Document Released: 01/12/2011 Document Revised: 07/30/2011 Document Reviewed: 01/12/2011 [employer] Patient Information 2012 [employer]. 

## 2011-12-11 NOTE — Progress Notes (Signed)
Benjamin [name] presents today for phlebotomy per MD orders. Phlebotomy procedure started at 1242 and ended at 1252 Approximately removed. Patient observed for 30 minutes after procedure without any incident. Patient tolerated procedure well. IV needle removed intact. Teola Bradley, [name]

## 2011-12-14 NOTE — Progress Notes (Signed)
CC:   Benjamin [name], M.D.  DIAGNOSIS:  Polycythemia vera, JAK2 positive.  CURRENT THERAPY: 1. Phlebotomy to maintain hematocrit less than 45%. 2. Aspirin 81 mg p.o. daily.  INTERIM HISTORY:  Benjamin [name] comes in for followup.  He is doing okay. He has really done well with phlebotomies.  He has not been phlebotomized since December.  His last iron showed a ferritin of 11.  He is still having issues with his libido.  He says testosterone is not helping him.  He is wondering if he could get one of the ED medications. I do not think that would be a problem.  I will go ahead and prescribe some Cialis for him.  He has had no headache.  There has been no double vision or blurred vision.  He has had no leg swelling.  There have been no rashes.  PHYSICAL EXAMINATION:  General:  This is a well-developed, well- nourished white gentleman in no obvious distress.  Vital signs:  97.9, pulse 74, respiratory rate 20, blood pressure 142/80.  Weight is 178. Head and neck:  Exam shows a normocephalic, atraumatic skull.  There are no ocular or oral lesions.  There are no palpable cervical or supraclavicular lymph nodes.  Lungs:  Clear bilaterally.  Cardiac: Regular rate and rhythm with a normal S1, S2.  There are no murmurs, rubs or bruits.  Abdomen:  Soft with good bowel sounds.  There is no palpable abdominal mass.  There is no fluid wave.  There is no palpable hepatosplenomegaly.  Back:  No tenderness over the spine, ribs or hips. Extremities:  Shows no clubbing, cyanosis or edema.  LABORATORY STUDIES:  White cell count is 17.4, hemoglobin 14.2, hematocrit 46.5, platelet count 283.  IMPRESSION:  Benjamin [name] is a 61 year old gentleman with polycythemia vera.  Again, he is JAK2 positive.  We will go ahead and phlebotomize him today.  He has really done quite well being this is I think 4 months.  We will plan to get him back to see me in about 6 weeks or so.  I do not see that we need any  blood work in-between visits.   ______________________________ Benjamin [name], M.D. PRE/MEDQ  D:  12/11/2011  T:  12/11/2011  Job:  [id number]

## 2012-01-22 ENCOUNTER — Ambulatory Visit (HOSPITAL_BASED_OUTPATIENT_CLINIC_OR_DEPARTMENT_OTHER): Payer: BC Managed Care – PPO | Admitting: Hematology & Oncology

## 2012-01-22 ENCOUNTER — Other Ambulatory Visit (HOSPITAL_BASED_OUTPATIENT_CLINIC_OR_DEPARTMENT_OTHER): Payer: BC Managed Care – PPO | Admitting: Lab

## 2012-01-22 VITALS — BP 123/69 | HR 71 | Temp 97.6°F | Ht 72.0 in | Wt 179.0 lb

## 2012-01-22 DIAGNOSIS — D45 Polycythemia vera: Secondary | ICD-10-CM

## 2012-01-22 DIAGNOSIS — D509 Iron deficiency anemia, unspecified: Secondary | ICD-10-CM

## 2012-01-22 LAB — CBC WITH DIFFERENTIAL (CANCER CENTER ONLY)
BASO%: 0.5 % (ref 0.0–2.0)
EOS%: 1.1 % (ref 0.0–7.0)
HCT: 43 % (ref 38.7–49.9)
LYMPH#: 1.1 10*3/uL (ref 0.9–3.3)
LYMPH%: 7.3 % — ABNORMAL LOW (ref 14.0–48.0)
MCHC: 30.2 g/dL — ABNORMAL LOW (ref 32.0–35.9)
MCV: 67 fL — ABNORMAL LOW (ref 82–98)
MONO#: 0.5 10*3/uL (ref 0.1–0.9)
NEUT%: 88.1 % — ABNORMAL HIGH (ref 40.0–80.0)
Platelets: 277 10*3/uL (ref 145–400)
RDW: 21.6 % — ABNORMAL HIGH (ref 11.1–15.7)
WBC: 14.9 10*3/uL — ABNORMAL HIGH (ref 4.0–10.0)

## 2012-01-22 LAB — TECHNOLOGIST REVIEW CHCC SATELLITE

## 2012-01-22 LAB — CHCC SATELLITE - SMEAR

## 2012-01-22 NOTE — Progress Notes (Signed)
This office note has been dictated.

## 2012-01-25 NOTE — Progress Notes (Signed)
CC:   [name], M.D.  DIAGNOSIS:  Polycythemia vera, JAK2 positive.  CURRENT THERAPY: 1. Phlebotomy to maintain hematocrit less than 45%. 2. Aspirin 81 mg p.o. daily.  INTERIM HISTORY:  Mr. Bodkin comes in for his followup.  He is doing fairly well.  He I think was phlebotomized back in April.  His hemoglobin was 46.5.  He is on Cialis right now.  He feels like the Cialis might actually be helping his polycythemia.  I told him I was not aware of any obvious studies that looked at Cialis but one never knows secondary effects from these medications.  He clearly is iron deficient.  His last ferritin was 11 back in March.  He has had no fatigue.  He has had no weakness.  He has had no nausea, vomiting.  He has had no pruritus.  He has had no leg swelling.  He does have rosacea on his face.  I think he takes doxycycline for this.  This does seem to help a little bit.  PHYSICAL EXAMINATION:  General:  This is a well-developed, well- nourished white gentleman in no obvious distress.  Vital signs:  Show temperature of 97.6, pulse 71, respiratory rate 18, blood pressure 123/69.  Weight is 179.  Head and neck:  Exam shows a normocephalic, atraumatic skull.  There are no ocular or oral lesions.  There are no palpable cervical or supraclavicular lymph nodes.  Lungs:  Clear bilaterally.  Cardiac:  Regular rate and rhythm with a normal S1, S2. There are no murmurs, rubs or bruits.  Abdomen:  Soft with good bowel sounds.  There is no palpable abdominal mass.  There is no fluid wave. There is no palpable hepatosplenomegaly.  Back:  No tenderness over the spine, ribs or hips.  Extremities:  Shows no clubbing, cyanosis or edema.  Neurological:  Shows no focal neurological deficits.  LABORATORY STUDIES:  White cell count is 14.9, hemoglobin 15, hematocrit 43, platelet count 277.  MCV is 67.  IMPRESSION:  Mr. Hauk is a 61 year old gentleman with polycythemia vera.  He is on aspirin.   We are being aggressive with his phlebotomy program.  We will plan to get him back to see [place] now in 2 months.  I do not feel that we need any lab work in-between visits.    ______________________________ Josph Macho, M.D. PRE/MEDQ  D:  01/22/2012  T:  01/22/2012  Job:  [id number]

## 2012-03-23 ENCOUNTER — Ambulatory Visit: Payer: BC Managed Care – PPO

## 2012-03-23 ENCOUNTER — Other Ambulatory Visit (HOSPITAL_BASED_OUTPATIENT_CLINIC_OR_DEPARTMENT_OTHER): Payer: BC Managed Care – PPO | Admitting: Lab

## 2012-03-23 ENCOUNTER — Ambulatory Visit (HOSPITAL_BASED_OUTPATIENT_CLINIC_OR_DEPARTMENT_OTHER): Payer: BC Managed Care – PPO | Admitting: Hematology & Oncology

## 2012-03-23 VITALS — BP 127/70 | HR 76 | Temp 97.1°F | Ht 72.0 in | Wt 180.0 lb

## 2012-03-23 DIAGNOSIS — D45 Polycythemia vera: Secondary | ICD-10-CM

## 2012-03-23 DIAGNOSIS — D509 Iron deficiency anemia, unspecified: Secondary | ICD-10-CM

## 2012-03-23 LAB — CBC WITH DIFFERENTIAL (CANCER CENTER ONLY)
BASO#: 0.1 10*3/uL (ref 0.0–0.2)
BASO%: 0.6 % (ref 0.0–2.0)
EOS%: 1.4 % (ref 0.0–7.0)
HGB: 14.3 g/dL (ref 13.0–17.1)
LYMPH#: 1.3 10*3/uL (ref 0.9–3.3)
MCH: 20.4 pg — ABNORMAL LOW (ref 28.0–33.4)
MCHC: 30.6 g/dL — ABNORMAL LOW (ref 32.0–35.9)
MONO%: 3.2 % (ref 0.0–13.0)
NEUT#: 17.6 10*3/uL — ABNORMAL HIGH (ref 1.5–6.5)
Platelets: 278 10*3/uL (ref 145–400)
RDW: 22.7 % — ABNORMAL HIGH (ref 11.1–15.7)

## 2012-03-23 LAB — TECHNOLOGIST REVIEW CHCC SATELLITE

## 2012-03-23 LAB — FERRITIN: Ferritin: 8 ng/mL — ABNORMAL LOW (ref 22–322)

## 2012-03-23 NOTE — Progress Notes (Signed)
Seen by Dr. Myna Hidalgo, for phlebotomy next week per patient's request. Benjamin [name]

## 2012-03-23 NOTE — Progress Notes (Signed)
This office note has been dictated.

## 2012-03-24 NOTE — Progress Notes (Signed)
CC:   Benjamin [name], M.D.  DIAGNOSIS:  Polycythemia vera, JAK2 positive  CURRENT THERAPY: 1. Phlebotomy to maintain hematocrit less than 45%. 2. Aspirin 81 mg p.o. daily.  INTERVAL HISTORY:  Benjamin [name] comes in for his followup.  He is doing okay.  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  He has had no problems with nausea or vomiting.  He has had no headache. He has had no cough or shortness of breath.  He has had no leg swelling. He  has not noticed any kind of rashes.  We are definitely keeping him iron deficient.  His last ferritin was 8 back in late May.  PHYSICAL EXAMINATION:  This is a well-developed, well-nourished white gentleman in no obvious distress.  Vital signs:  Temperature of 97.1, pulse 76, respiratory rate 20, blood pressure 127/70.  Weight is 180. Head and neck:  Normocephalic, atraumatic skull.  There are no ocular or oral lesions.  There are no palpable cervical or supraclavicular lymph nodes.  Lungs:  Clear bilaterally.  Cardiac:  Regular rate and rhythm with a normal S1 and S2.  There are no murmurs, rubs or bruits. Abdomen:  Soft with good bowel sounds.  There is no palpable abdominal mass.  There is no fluid wave.  There is no palpable hepatosplenomegaly. Back:  No tenderness over the spine, ribs, or hips.  Extremities:  No clubbing, cyanosis or edema.  Neurological:  Shows no focal neurological deficits.  LABORATORY STUDIES:  White cell count is 19.9, hemoglobin 14.3 and 46.8, platelet count 278.  MCV is 67.  IMPRESSION:  Benjamin [name] is a 61 year old gentleman with polycythemia vera.  So far, we have not had any complications with this.  We are being aggressive in trying to keep his hematocrit below 45%.  Will go ahead and get this set up now.  He wants to have the phlebotomy next week.  We will set this up for the 8th of August.  I will plan see him back myself in another 6 weeks or so.  Typically, he needs to be  phlebotomized every 3 months.   ______________________________ Josph Macho, M.D. PRE/MEDQ  D:  03/23/2012  T:  03/24/2012  Job:  [id number]

## 2012-03-31 ENCOUNTER — Ambulatory Visit (HOSPITAL_BASED_OUTPATIENT_CLINIC_OR_DEPARTMENT_OTHER): Payer: BC Managed Care – PPO

## 2012-03-31 VITALS — BP 126/70 | HR 71 | Temp 97.1°F

## 2012-03-31 DIAGNOSIS — D45 Polycythemia vera: Secondary | ICD-10-CM

## 2012-03-31 NOTE — Progress Notes (Signed)
Benjamin [name] presents today for phlebotomy per MD orders. Phlebotomy procedure started at 1340 and ended at1350  500 cc removed. Patient tolerated procedure well. IV needle removed intact.

## 2012-03-31 NOTE — Patient Instructions (Signed)
Therapeutic Phlebotomy Therapeutic phlebotomy is the controlled removal of blood from your body for the purpose of treating a medical condition. It is similar to donating blood. Usually, about a pint (470 mL) of blood is removed. The average adult has 9 to 12 pints (4.3 to 5.7 L) of blood. Therapeutic phlebotomy may be used to treat the following medical conditions:  Hemochromatosis. This is a condition in which there is too much iron in the blood.   Polycythemia vera. This is a condition in which there are too many red cells in the blood.   Porphyria cutanea tarda. This is a disease usually passed from one generation to the next (inherited). It is a condition in which an important part of hemoglobin is not made properly. This results in the build up of abnormal amounts of porphyrins in the body.   Sickle cell disease. This is an inherited disease. It is a condition in which the red blood cells form an abnormal crescent shape rather than a round shape.  LET YOUR CAREGIVER KNOW ABOUT:  Allergies.   Medicines taken including herbs, eyedrops, over-the-counter medicines, and creams.   Use of steroids (by mouth or creams).   Previous problems with anesthetics or numbing medicine.   History of blood clots.   History of bleeding or blood problems.   Previous surgery.   Possibility of pregnancy, if this applies.  RISKS AND COMPLICATIONS This is a simple and safe procedure. Problems are unlikely. However, problems can occur and may include:  Nausea or lightheadedness.   Low blood pressure.   Soreness, bleeding, swelling, or bruising at the needle insertion site.   Infection.  BEFORE THE PROCEDURE  This is a procedure that can be done as an outpatient. Confirm the time that you need to arrive for your procedure. Confirm whether there is a need to fast or withhold any medications. It is helpful to wear clothing with sleeves that can be raised above the elbow. A blood sample may be done  to determine the amount of red blood cells or iron in your blood. Plan ahead of time to have someone drive you home after the procedure. PROCEDURE The entire procedure from preparation through recovery takes about 1 hour. The actual collection takes about 10 to 15 minutes.  A needle will be inserted into your vein.   Tubing and a collection bag will be attached to that needle.   Blood will flow through the needle and tubing into the collection bag.   You may be asked to open and close your hand slowly and continuously during the entire collection.   Once the specified amount of blood has been removed from your body, the collection bag and tubing will be clamped.   The needle will be removed.   Pressure will be held on the site of the needle insertion to stop the bleeding. Then a bandage will be placed over the needle insertion site.  AFTER THE PROCEDURE  Your recovery will be assessed and monitored. If there are no problems, as an outpatient, you should be able to go home shortly after the procedure.  Document Released: 01/12/2011 Document Revised: 07/30/2011 Document Reviewed: 01/12/2011 [employer] Patient Information 2012 [employer]. 

## 2012-05-03 NOTE — Progress Notes (Deleted)
Benjamin [name] presents today for phlebotomy per MD orders. Phlebotomy procedure started at 1300 and ended at 1310. 500 ml removed. Patient observed for 30 minutes after procedure without any incident. Patient tolerated procedure well. IV needle removed intact.

## 2012-05-13 ENCOUNTER — Encounter: Payer: Self-pay | Admitting: Internal Medicine

## 2012-05-16 ENCOUNTER — Encounter: Payer: Self-pay | Admitting: Internal Medicine

## 2012-06-02 ENCOUNTER — Ambulatory Visit (HOSPITAL_BASED_OUTPATIENT_CLINIC_OR_DEPARTMENT_OTHER): Payer: BC Managed Care – PPO | Admitting: Hematology & Oncology

## 2012-06-02 ENCOUNTER — Other Ambulatory Visit (HOSPITAL_BASED_OUTPATIENT_CLINIC_OR_DEPARTMENT_OTHER): Payer: BC Managed Care – PPO | Admitting: Lab

## 2012-06-02 VITALS — BP 128/59 | HR 73 | Temp 97.2°F | Resp 73 | Ht 72.0 in | Wt 175.0 lb

## 2012-06-02 DIAGNOSIS — D45 Polycythemia vera: Secondary | ICD-10-CM

## 2012-06-02 LAB — CBC WITH DIFFERENTIAL (CANCER CENTER ONLY)
BASO#: 0.1 10*3/uL (ref 0.0–0.2)
Eosinophils Absolute: 0.2 10*3/uL (ref 0.0–0.5)
HCT: 45.2 % (ref 38.7–49.9)
HGB: 13.8 g/dL (ref 13.0–17.1)
LYMPH#: 0.9 10*3/uL (ref 0.9–3.3)
LYMPH%: 5.9 % — ABNORMAL LOW (ref 14.0–48.0)
MCV: 67 fL — ABNORMAL LOW (ref 82–98)
MONO#: 0.4 10*3/uL (ref 0.1–0.9)
NEUT%: 89.4 % — ABNORMAL HIGH (ref 40.0–80.0)
WBC: 14.4 10*3/uL — ABNORMAL HIGH (ref 4.0–10.0)

## 2012-06-02 NOTE — Progress Notes (Signed)
This office note has been dictated.

## 2012-06-02 NOTE — Patient Instructions (Signed)
Call idf problems

## 2012-06-03 NOTE — Progress Notes (Signed)
CC:   Benjamin [name], M.D.  DIAGNOSIS:  Polycythemia vera, JAK2 positive.  CURRENT THERAPY: 1. Phlebotomy to maintain hematocrit less than 45%. 2. Aspirin 81 mg p.o. daily.  INTERIM HISTORY:  Benjamin [name] comes in for his followup.  He is really doing well.  He has had no problems since we last saw him.  I think he was last phlebotomized back in July.  He has had no issues with iron. His ferritin has been quite low.  His ferritin back in July was 8.  He has had some problems with rosacea.  He is on minocycline for this.  He has had no headache.  There has been no pain in his hands or feet. He has had no rashes, outside of the rosacea.  PHYSICAL EXAMINATION:  This is a well-developed, well-nourished white gentleman in no obvious distress.  Vital signs:  Temperature of 97.2, pulse 73, respiratory rate 18, blood pressure 128/59.  Weight is 175. Head and neck:  Normocephalic, atraumatic skull.  There are no ocular or oral lesions.  There are no palpable cervical or supraclavicular lymph nodes.  Lungs:  Clear bilaterally.  Cardiac:  Regular rate and rhythm with a normal S1 and S2.  There are no murmurs, rubs or bruits. Abdomen:  Soft with good bowel sounds.  There is no palpable abdominal mass.  There is no palpable hepatosplenomegaly.  Back:  No tenderness over the spine, ribs, or hips.  Neurologic:  No focal neurological deficits.  LABORATORY STUDIES:  White cell count is 14.4, hemoglobin 13.8, hematocrit 45, platelet count 251.  MCV is 67.  IMPRESSION:  Benjamin [name] is a 61 year old gentleman with polycythemia vera.  He has actually done quite well.  He has had no complications from this.  We will go ahead and plan to get him back in about a month.  By then, I think we probably will need to phlebotomize him and then get him through the holidays.    ______________________________ Josph Macho, M.D. PRE/MEDQ  D:  06/02/2012  T:  06/03/2012  Job:  [id number]

## 2012-07-07 ENCOUNTER — Ambulatory Visit: Payer: BC Managed Care – PPO

## 2012-07-07 ENCOUNTER — Other Ambulatory Visit (HOSPITAL_BASED_OUTPATIENT_CLINIC_OR_DEPARTMENT_OTHER): Payer: BC Managed Care – PPO | Admitting: Lab

## 2012-07-07 ENCOUNTER — Ambulatory Visit (HOSPITAL_BASED_OUTPATIENT_CLINIC_OR_DEPARTMENT_OTHER): Payer: BC Managed Care – PPO | Admitting: Hematology & Oncology

## 2012-07-07 VITALS — BP 132/62 | HR 72 | Temp 97.7°F | Resp 18 | Ht 72.0 in | Wt 175.0 lb

## 2012-07-07 DIAGNOSIS — L719 Rosacea, unspecified: Secondary | ICD-10-CM

## 2012-07-07 DIAGNOSIS — D45 Polycythemia vera: Secondary | ICD-10-CM

## 2012-07-07 LAB — CBC WITH DIFFERENTIAL (CANCER CENTER ONLY)
BASO#: 0.1 10*3/uL (ref 0.0–0.2)
EOS%: 1.4 % (ref 0.0–7.0)
HCT: 48 % (ref 38.7–49.9)
HGB: 14.5 g/dL (ref 13.0–17.1)
LYMPH#: 1.2 10*3/uL (ref 0.9–3.3)
LYMPH%: 6.7 % — ABNORMAL LOW (ref 14.0–48.0)
MCH: 20.1 pg — ABNORMAL LOW (ref 28.0–33.4)
MCHC: 30.2 g/dL — ABNORMAL LOW (ref 32.0–35.9)
MCV: 67 fL — ABNORMAL LOW (ref 82–98)
MONO%: 3.5 % (ref 0.0–13.0)
NEUT%: 87.7 % — ABNORMAL HIGH (ref 40.0–80.0)

## 2012-07-07 LAB — FERRITIN: Ferritin: 10 ng/mL — ABNORMAL LOW (ref 22–322)

## 2012-07-07 NOTE — Progress Notes (Signed)
Patient didn't have time to get phlebotomy today so rescheduled for tuesday

## 2012-07-07 NOTE — Patient Instructions (Signed)
Therapeutic Phlebotomy Therapeutic phlebotomy is the controlled removal of blood from your body for the purpose of treating a medical condition. It is similar to donating blood. Usually, about a pint (470 mL) of blood is removed. The average adult has 9 to 12 pints (4.3 to 5.7 L) of blood. Therapeutic phlebotomy may be used to treat the following medical conditions:  Hemochromatosis. This is a condition in which there is too much iron in the blood.  Polycythemia vera. This is a condition in which there are too many red cells in the blood.  Porphyria cutanea tarda. This is a disease usually passed from one generation to the next (inherited). It is a condition in which an important part of hemoglobin is not made properly. This results in the build up of abnormal amounts of porphyrins in the body.  Sickle cell disease. This is an inherited disease. It is a condition in which the red blood cells form an abnormal crescent shape rather than a round shape. LET YOUR CAREGIVER KNOW ABOUT:  Allergies.  Medicines taken including herbs, eyedrops, over-the-counter medicines, and creams.  Use of steroids (by mouth or creams).  Previous problems with anesthetics or numbing medicine.  History of blood clots.  History of bleeding or blood problems.  Previous surgery.  Possibility of pregnancy, if this applies. RISKS AND COMPLICATIONS This is a simple and safe procedure. Problems are unlikely. However, problems can occur and may include:  Nausea or lightheadedness.  Low blood pressure.  Soreness, bleeding, swelling, or bruising at the needle insertion site.  Infection. BEFORE THE PROCEDURE  This is a procedure that can be done as an outpatient. Confirm the time that you need to arrive for your procedure. Confirm whether there is a need to fast or withhold any medications. It is helpful to wear clothing with sleeves that can be raised above the elbow. A blood sample may be done to determine the  amount of red blood cells or iron in your blood. Plan ahead of time to have someone drive you home after the procedure. PROCEDURE The entire procedure from preparation through recovery takes about 1 hour. The actual collection takes about 10 to 15 minutes.  A needle will be inserted into your vein.  Tubing and a collection bag will be attached to that needle.  Blood will flow through the needle and tubing into the collection bag.  You may be asked to open and close your hand slowly and continuously during the entire collection.  Once the specified amount of blood has been removed from your body, the collection bag and tubing will be clamped.  The needle will be removed.  Pressure will be held on the site of the needle insertion to stop the bleeding. Then a bandage will be placed over the needle insertion site. AFTER THE PROCEDURE  Your recovery will be assessed and monitored. If there are no problems, as an outpatient, you should be able to go home shortly after the procedure.  Document Released: 01/12/2011 Document Revised: 11/02/2011 Document Reviewed: 01/12/2011 [employer] Patient Information 2013 [employer].  

## 2012-07-07 NOTE — Progress Notes (Signed)
This office note has been dictated.

## 2012-07-08 NOTE — Progress Notes (Signed)
CC:   Benjamin [name], M.D.  DIAGNOSIS:  Polycythemia, JAK2 positive.  CURRENT THERAPY: 1. Phlebotomy to maintain hematocrit less than 45%. 2. Aspirin 81 mg p.o. daily.  INTERIM HISTORY:  Benjamin [name] comes in for his first followup.  He is doing well.  Unfortunately, his father passed away a couple of weeks ago.  He had a heart attack.  He was 61 years old.  Benjamin [name] has been doing well overall.  His last phlebotomy was back in July.  His ferritin has been kept nice and low with phlebotomies.  His last ferritin was 8 back in July.  There has been no headache.  There has been no itching.  He does have rosacea on his face.  He puts on a topical cream for this.  There has been no change in bowel or bladder habits.  He has had no rashes outside of the rosacea.  PHYSICAL EXAMINATION:  This is a well-developed, well-nourished, white gentleman in no obvious distress.  Vital Signs:  Temperature of 97.7, pulse 72, respiratory rate 18, blood pressure 132/62, weight 175.  Head and Neck:  Normocephalic, atraumatic skull.  There are no ocular or oral lesions.  There are no palpable cervical or supraclavicular lymph nodes. Lungs:  Clear to percussion and auscultation bilaterally.  Cardiac: Regular rate and rhythm with a normal S1 and S2.  There are no murmurs, rubs or bruits.  Abdomen:  Soft with good bowel sounds.  There is no palpable abdominal mass.  There is no palpable hepatosplenomegaly. Extremities:  No clubbing, cyanosis or edema.  Neurological:  No focal neurological deficits.  LABORATORY STUDIES:  White cell count is 18.3, hemoglobin 14.5, hematocrit 48, platelet count 273.  MCV is 67.  IMPRESSION:  Benjamin [name] is a 61 year old gentleman with polycythemia vera.  Again, he is JAK2 positive.  We are doing a good job in maintaining his hematocrit less than 45%, and preventing complications.  We will go ahead and phlebotomize him next week.  I think we can get him through the  holidays.  I will see him back in January 2014.    ______________________________ Josph Macho, M.D. PRE/MEDQ  D:  07/07/2012  T:  07/08/2012  Job:  [id number]

## 2012-07-12 ENCOUNTER — Ambulatory Visit (HOSPITAL_BASED_OUTPATIENT_CLINIC_OR_DEPARTMENT_OTHER): Payer: BC Managed Care – PPO

## 2012-07-12 VITALS — BP 125/69 | HR 83 | Temp 97.0°F | Resp 18

## 2012-07-12 DIAGNOSIS — D45 Polycythemia vera: Secondary | ICD-10-CM

## 2012-07-12 NOTE — Progress Notes (Signed)
Benjamin [name] presents today for phlebotomy per MD orders. Phlebotomy procedure started at 1425 and ended at 1442. 500 ml removed. Patient observed for 30 minutes after procedure without any incident. Patient tolerated procedure well. IV needle removed intact.

## 2012-07-12 NOTE — Patient Instructions (Signed)
Therapeutic Phlebotomy Therapeutic phlebotomy is the controlled removal of blood from your body for the purpose of treating a medical condition. It is similar to donating blood. Usually, about a pint (470 mL) of blood is removed. The average adult has 9 to 12 pints (4.3 to 5.7 L) of blood. Therapeutic phlebotomy may be used to treat the following medical conditions:  Hemochromatosis. This is a condition in which there is too much iron in the blood.  Polycythemia vera. This is a condition in which there are too many red cells in the blood.  Porphyria cutanea tarda. This is a disease usually passed from one generation to the next (inherited). It is a condition in which an important part of hemoglobin is not made properly. This results in the build up of abnormal amounts of porphyrins in the body.  Sickle cell disease. This is an inherited disease. It is a condition in which the red blood cells form an abnormal crescent shape rather than a round shape. LET YOUR CAREGIVER KNOW ABOUT:  Allergies.  Medicines taken including herbs, eyedrops, over-the-counter medicines, and creams.  Use of steroids (by mouth or creams).  Previous problems with anesthetics or numbing medicine.  History of blood clots.  History of bleeding or blood problems.  Previous surgery.  Possibility of pregnancy, if this applies. RISKS AND COMPLICATIONS This is a simple and safe procedure. Problems are unlikely. However, problems can occur and may include:  Nausea or lightheadedness.  Low blood pressure.  Soreness, bleeding, swelling, or bruising at the needle insertion site.  Infection. BEFORE THE PROCEDURE  This is a procedure that can be done as an outpatient. Confirm the time that you need to arrive for your procedure. Confirm whether there is a need to fast or withhold any medications. It is helpful to wear clothing with sleeves that can be raised above the elbow. A blood sample may be done to determine the  amount of red blood cells or iron in your blood. Plan ahead of time to have someone drive you home after the procedure. PROCEDURE The entire procedure from preparation through recovery takes about 1 hour. The actual collection takes about 10 to 15 minutes.  A needle will be inserted into your vein.  Tubing and a collection bag will be attached to that needle.  Blood will flow through the needle and tubing into the collection bag.  You may be asked to open and close your hand slowly and continuously during the entire collection.  Once the specified amount of blood has been removed from your body, the collection bag and tubing will be clamped.  The needle will be removed.  Pressure will be held on the site of the needle insertion to stop the bleeding. Then a bandage will be placed over the needle insertion site. AFTER THE PROCEDURE  Your recovery will be assessed and monitored. If there are no problems, as an outpatient, you should be able to go home shortly after the procedure.  Document Released: 01/12/2011 Document Revised: 11/02/2011 Document Reviewed: 01/12/2011 [employer] Patient Information 2013 [employer].  

## 2012-09-06 ENCOUNTER — Ambulatory Visit: Payer: BC Managed Care – PPO

## 2012-09-06 ENCOUNTER — Ambulatory Visit (HOSPITAL_BASED_OUTPATIENT_CLINIC_OR_DEPARTMENT_OTHER): Payer: BC Managed Care – PPO | Admitting: Hematology & Oncology

## 2012-09-06 ENCOUNTER — Other Ambulatory Visit (HOSPITAL_BASED_OUTPATIENT_CLINIC_OR_DEPARTMENT_OTHER): Payer: BC Managed Care – PPO | Admitting: Lab

## 2012-09-06 VITALS — BP 123/61 | HR 71 | Temp 97.5°F | Resp 18 | Ht 70.0 in | Wt 175.0 lb

## 2012-09-06 DIAGNOSIS — D45 Polycythemia vera: Secondary | ICD-10-CM

## 2012-09-06 LAB — CBC WITH DIFFERENTIAL (CANCER CENTER ONLY)
EOS%: 1.1 % (ref 0.0–7.0)
MCH: 20.1 pg — ABNORMAL LOW (ref 28.0–33.4)
MCHC: 29.9 g/dL — ABNORMAL LOW (ref 32.0–35.9)
MONO%: 2.7 % (ref 0.0–13.0)
NEUT#: 15.4 10*3/uL — ABNORMAL HIGH (ref 1.5–6.5)
Platelets: 262 10*3/uL (ref 145–400)
RBC: 7.06 10*6/uL — ABNORMAL HIGH (ref 4.20–5.70)

## 2012-09-06 LAB — FERRITIN: Ferritin: 10 ng/mL — ABNORMAL LOW (ref 22–322)

## 2012-09-06 NOTE — Progress Notes (Signed)
Pt did not want to do phlebotomy today. Dr. Myna Hidalgo aware of the situation.

## 2012-09-06 NOTE — Progress Notes (Signed)
This office note has been dictated.

## 2012-09-07 NOTE — Progress Notes (Signed)
CC:   [name], M.D.  DIAGNOSIS:  Polycythemia vera-JAK2 positive.  CURRENT THERAPY: 1. Phlebotomy to maintain hematocrit less than 45%. 2. Aspirin 81 mg p.o. daily.  INTERIM HISTORY:  Mr. Gassman comes in for followup.  He is doing okay. Unfortunately, his mom had a furnace break last week when it was 5 degrees.  He had to go pick her up and have her come stay with him.  He is going back today to take her back to her house.  His father passed away I think back in 09-Jun-2023.  They are getting through this.  They did okay over the holidays.  He is trying to stay busy.  He plays in a band.  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  He has had no headache.  He has had no pruritus.  There has been no change in bowel or bladder habits.  He has had no leg swelling.  There have been no arthralgias or myalgias.  He does have rosacea.  He uses a cream for this.  PHYSICAL EXAMINATION:  General:  This is a well-developed, well- nourished white gentleman in no obvious distress.  Vital signs: Temperature of 97.5, pulse 71, respiratory rate 18, blood pressure 123/61.  Weight is 175.  Head and neck:  Normocephalic, atraumatic skull.  There are no ocular or oral lesions.  There are no palpable cervical or supraclavicular lymph nodes.  Lungs:  Clear bilaterally. Cardiac:  Regular rate and rhythm with a normal S1 and S2.  There are no murmurs, rubs, or bruits.  Abdomen:  Soft with good bowel sounds.  There is no palpable abdominal mass.  There is no fluid wave.  There is no palpable hepatosplenomegaly.  Back:  No tenderness over the spine, ribs, or hips.  Extremities:  No clubbing, cyanosis, or edema.  Skin:  Does show the rosacea on his face.  LABORATORY STUDIES:  White cell count is 17.4, hemoglobin 14.2, hematocrit 47.5, platelet count 262.  IMPRESSION:  Mr. Yom is a 62 year old gentleman with polycythemia. Of note, his last ferritin back in November was 10.  He  does need to be phlebotomized.  Again I am going to try to be aggressive in keeping his hematocrit below 45%.  He will come back later on this week or next week for his phlebotomy.  I will get him back in 6 weeks' time.    ______________________________ Josph Macho, M.D. PRE/MEDQ  D:  09/06/2012  T:  09/07/2012  Job:  [id number]

## 2012-09-13 ENCOUNTER — Ambulatory Visit (HOSPITAL_BASED_OUTPATIENT_CLINIC_OR_DEPARTMENT_OTHER): Payer: BC Managed Care – PPO

## 2012-09-13 VITALS — BP 134/71 | HR 80 | Temp 97.6°F | Resp 18 | Ht 70.0 in | Wt 175.0 lb

## 2012-09-13 DIAGNOSIS — D45 Polycythemia vera: Secondary | ICD-10-CM

## 2012-09-13 NOTE — Progress Notes (Signed)
Benjamin [name] presents today for phlebotomy per MD orders. Phlebotomy procedure started at 1345 and ended at 1400. 500 ml  removed. Patient observed for 30 minutes after procedure without any incident. Patient tolerated procedure well. IV needle removed intact.

## 2012-09-13 NOTE — Patient Instructions (Signed)

## 2012-10-14 ENCOUNTER — Telehealth: Payer: Self-pay | Admitting: Hematology & Oncology

## 2012-10-14 NOTE — Telephone Encounter (Signed)
Patient called and cx 10/18/12 apt and resch for 10/31/12

## 2012-10-18 ENCOUNTER — Other Ambulatory Visit: Payer: BC Managed Care – PPO | Admitting: Lab

## 2012-10-18 ENCOUNTER — Ambulatory Visit: Payer: BC Managed Care – PPO | Admitting: Medical

## 2012-10-31 ENCOUNTER — Ambulatory Visit (HOSPITAL_BASED_OUTPATIENT_CLINIC_OR_DEPARTMENT_OTHER): Payer: BC Managed Care – PPO | Admitting: Medical

## 2012-10-31 ENCOUNTER — Ambulatory Visit (HOSPITAL_BASED_OUTPATIENT_CLINIC_OR_DEPARTMENT_OTHER): Payer: BC Managed Care – PPO

## 2012-10-31 ENCOUNTER — Other Ambulatory Visit (HOSPITAL_BASED_OUTPATIENT_CLINIC_OR_DEPARTMENT_OTHER): Payer: BC Managed Care – PPO | Admitting: Lab

## 2012-10-31 VITALS — BP 127/64 | HR 81 | Resp 18

## 2012-10-31 VITALS — BP 126/57 | HR 81 | Temp 97.4°F | Resp 18 | Ht 70.0 in | Wt 177.0 lb

## 2012-10-31 DIAGNOSIS — D45 Polycythemia vera: Secondary | ICD-10-CM

## 2012-10-31 LAB — CBC WITH DIFFERENTIAL (CANCER CENTER ONLY)
Eosinophils Absolute: 0.1 10*3/uL (ref 0.0–0.5)
MCH: 20.2 pg — ABNORMAL LOW (ref 28.0–33.4)
MONO#: 0.6 10*3/uL (ref 0.1–0.9)
MONO%: 3.7 % (ref 0.0–13.0)
NEUT#: 15.1 10*3/uL — ABNORMAL HIGH (ref 1.5–6.5)
Platelets: 336 10*3/uL (ref 145–400)
RBC: 6.87 10*6/uL — ABNORMAL HIGH (ref 4.20–5.70)
WBC: 16.9 10*3/uL — ABNORMAL HIGH (ref 4.0–10.0)

## 2012-10-31 NOTE — Progress Notes (Signed)
e

## 2012-10-31 NOTE — Progress Notes (Signed)
Benjamin [name] presents today for phlebotomy per MD orders. Phlebotomy procedure started at 1450 and ended at 1500. 500 ml removed. Patient observed for 30 minutes after procedure without any incident. Patient tolerated procedure well. IV needle removed intact.

## 2012-10-31 NOTE — Progress Notes (Signed)
DIAGNOSIS:  Polycythemia vera-JAK2 positive.  CURRENT THERAPY: 1. Phlebotomy to maintain hematocrit less than 45%. 2. Aspirin 81 mg p.o. daily.  INTERIM HISTORY: [name] presents today for an office followup visit.  Overall, he, reports, that he feels great.  He does not report any new problems.  She's not reporting any headaches, pruritus or visual changes.  He remains on an aspirin daily.  We like to keep his hematocrit below 45%.  His hematocrit is 46.  Today, as, such, we will go ahead and phlebotomize him.  He, reports, a good appetite.  He denies any nausea, vomiting, diarrhea, constipation, chest pain, shortness breath, or cough.  He denies any fevers, chills, or night sweats.  He denies any type of lower leg swelling.  He denies any odynophagia or dysphasia.  He's not reported any arthralgias or myalgias.  Of note, he does have rosacea and uses a cream for this.  Review of Systems: Constitutional:Negative for malaise/fatigue, fever, chills, weight loss, diaphoresis, activity change, appetite change, and unexpected weight change.  HEENT: Negative for double vision, blurred vision, visual loss, ear pain, tinnitus, congestion, rhinorrhea, epistaxis sore throat or sinus disease, oral pain/lesion, tongue soreness Respiratory: Negative for cough, chest tightness, shortness of breath, wheezing and stridor.  Cardiovascular: Negative for chest pain, palpitations, leg swelling, orthopnea, PND, DOE or claudication Gastrointestinal: Negative for nausea, vomiting, abdominal pain, diarrhea, constipation, blood in stool, melena, hematochezia, abdominal distention, anal bleeding, rectal pain, anorexia and hematemesis.  Genitourinary: Negative for dysuria, frequency, hematuria,  Musculoskeletal: Negative for myalgias, back pain, joint swelling, arthralgias and gait problem.  Skin: Negative for rash, color change, pallor and wound.  Neurological:. Negative for dizziness/light-headedness, tremors,  seizures, syncope, facial asymmetry, speech difficulty, weakness, numbness, headaches and paresthesias.  Hematological: Negative for adenopathy. Does not bruise/bleed easily.  Psychiatric/Behavioral:  Negative for depression, no loss of interest in normal activity or change in sleep pattern.   Physical Exam: This is a pleasant, 62 year old, well-developed, well-nourished, white gentleman, in no obvious distress Vitals: Temperature 97.4 degrees, pulse 81, respirations 18, blood pressure 126/57, weight 177 pounds HEENT reveals a normocephalic, atraumatic skull, no scleral icterus, no oral lesions  Neck is supple without any cervical or supraclavicular adenopathy.  Lungs are clear to auscultation bilaterally. There are no wheezes, rales or rhonci Cardiac is regular rate and rhythm with a normal S1 and S2. There are no murmurs, rubs, or bruits.  Abdomen is soft with good bowel sounds, there is no palpable mass. There is no palpable hepatosplenomegaly. There is no palpable fluid wave.  Musculoskeletal no tenderness of the spine, ribs, or hips.  Extremities there are no clubbing, cyanosis, or edema.  Skin no petechia, purpura or ecchymosis Neurologic is nonfocal.  Laboratory Data: White count 16.9, hemoglobin 13.9, hematocrit 46.0, platelets 336,000  Current Outpatient Prescriptions on File Prior to Visit  Medication Sig Dispense Refill  . Ascorbic Acid (VITAMIN C) 1000 MG tablet Take 1,000 mg by mouth daily.       Marland Kitchen aspirin 325 MG tablet Take 325 mg by mouth daily.        Marland Kitchen CIALIS 10 MG tablet Take 5 mg by mouth as needed.       Marland Kitchen METROGEL 1 % gel 1 application daily.       . minocycline (MINOCIN,DYNACIN) 100 MG capsule Take by mouth 2 (two) times daily.       . NON FORMULARY as needed. Horse Chestnut tab      . Omega-3 Fatty Acids (FISH  OIL) 1000 MG CAPS Take by mouth every morning.      . Sulfacetamide Sodium 10 % SUSP Apply topically every morning.        No current facility-administered  medications on file prior to visit.   Assessment/Plan: This is a pleasant, 62 year old, white, with the following issues:  #1.  Polycythemia vera.  We like to keep his hematocrit below 45%.  His hematocrit is 46%, today.  We will go ahead and phlebotomize him.  He will remain on an aspirin.  #2.  Followup.  We'll follow back up with Benjamin [name] in 6 weeks, but before then should there be questions or concerns.

## 2012-10-31 NOTE — Patient Instructions (Signed)

## 2012-11-01 LAB — IRON AND TIBC
Iron: 14 ug/dL — ABNORMAL LOW (ref 42–165)
UIBC: 310 ug/dL (ref 125–400)

## 2012-11-01 LAB — FERRITIN: Ferritin: 10 ng/mL — ABNORMAL LOW (ref 22–322)

## 2012-12-12 ENCOUNTER — Other Ambulatory Visit (HOSPITAL_BASED_OUTPATIENT_CLINIC_OR_DEPARTMENT_OTHER): Payer: BC Managed Care – PPO | Admitting: Lab

## 2012-12-12 ENCOUNTER — Ambulatory Visit: Payer: BC Managed Care – PPO

## 2012-12-12 ENCOUNTER — Ambulatory Visit (HOSPITAL_BASED_OUTPATIENT_CLINIC_OR_DEPARTMENT_OTHER): Payer: BC Managed Care – PPO | Admitting: Hematology & Oncology

## 2012-12-12 VITALS — BP 123/74 | HR 74 | Temp 97.6°F | Resp 18 | Wt 173.0 lb

## 2012-12-12 DIAGNOSIS — D45 Polycythemia vera: Secondary | ICD-10-CM

## 2012-12-12 LAB — CBC WITH DIFFERENTIAL (CANCER CENTER ONLY)
BASO#: 0.1 10*3/uL (ref 0.0–0.2)
EOS%: 0.9 % (ref 0.0–7.0)
HGB: 12.4 g/dL — ABNORMAL LOW (ref 13.0–17.1)
LYMPH%: 6.5 % — ABNORMAL LOW (ref 14.0–48.0)
MCH: 19.8 pg — ABNORMAL LOW (ref 28.0–33.4)
MCHC: 29.8 g/dL — ABNORMAL LOW (ref 32.0–35.9)
MCV: 67 fL — ABNORMAL LOW (ref 82–98)
MONO%: 3.5 % (ref 0.0–13.0)
NEUT#: 18.1 10*3/uL — ABNORMAL HIGH (ref 1.5–6.5)
NEUT%: 88.5 % — ABNORMAL HIGH (ref 40.0–80.0)

## 2012-12-12 LAB — IRON AND TIBC
TIBC: 363 ug/dL (ref 215–435)
UIBC: 342 ug/dL (ref 125–400)

## 2012-12-12 LAB — FERRITIN: Ferritin: 4 ng/mL — ABNORMAL LOW (ref 22–322)

## 2012-12-12 NOTE — Progress Notes (Signed)
No phlebotomy today per Dr Ennever 

## 2012-12-12 NOTE — Progress Notes (Signed)
This office note has been dictated.

## 2012-12-13 NOTE — Progress Notes (Signed)
CC:   Benjamin [name], M.D.  DIAGNOSIS:  Polycythemia vera, JAK2 positive.  CURRENT THERAPY: 1. Phlebotomy to maintain hematocrit of less than 45%. 2. Aspirin 81 mg p.o. daily.  INTERIM HISTORY:  Benjamin [name] comes in for his followup.  He is doing quite well.  He was phlebotomized back in March.  At that point in time, his hematocrit was 46%.  He clearly is iron deficient.  His ferritin back in March was 10, with an iron saturation of 4%.  He has had no headache.  There has been no pain in his hands or feet. He has had no pruritus.  PHYSICAL EXAMINATION:  General:  This is a well-developed, well- nourished white gentleman in no obvious distress.  Vital signs: Temperature 97.6, pulse 74, respiratory rate 18, blood pressure 123/74. Weight is 173.  Head and neck:  Normocephalic, atraumatic skull.  There are no ocular or oral lesions.  There are no palpable cervical or supraclavicular lymph nodes.  Lungs:  Clear bilaterally.  Cardiac: Regular rate and rhythm with a normal S1 and S2.  There are no murmurs, rubs, or bruits.  Abdomen:  Soft with good bowel sounds.  There is no palpable abdominal mass.  There is no fluid wave.  There is no palpable hepatosplenomegaly.  Extremities:  Show no clubbing, cyanosis or edema. Skin:  No rashes, ecchymosis, or petechia.  LABORATORY STUDIES:  White cell count is 20.5, hemoglobin 12.4, hematocrit 41.6, platelet count 327.  MCV is 67.  IMPRESSION:  Benjamin [name] is a 62 year old gentleman with polycythemia vera.  Again, we will phlebotomize him and maintain his hematocrit below 45%.  His hematocrit is nice and low today.  Hopefully, this will hold him on for a while.  We will plan to get him back in 2 months.  I think this will be appropriate for him.    ______________________________ Benjamin [name], M.D. PRE/MEDQ  D:  12/12/2012  T:  12/13/2012  Job:  [id number]

## 2013-02-10 ENCOUNTER — Other Ambulatory Visit (HOSPITAL_BASED_OUTPATIENT_CLINIC_OR_DEPARTMENT_OTHER): Payer: BC Managed Care – PPO | Admitting: Lab

## 2013-02-10 ENCOUNTER — Ambulatory Visit (HOSPITAL_BASED_OUTPATIENT_CLINIC_OR_DEPARTMENT_OTHER): Payer: BC Managed Care – PPO | Admitting: Medical

## 2013-02-10 VITALS — BP 127/68 | HR 71 | Temp 97.5°F | Resp 18 | Ht 72.0 in | Wt 173.0 lb

## 2013-02-10 DIAGNOSIS — D45 Polycythemia vera: Secondary | ICD-10-CM

## 2013-02-10 LAB — CBC WITH DIFFERENTIAL (CANCER CENTER ONLY)
Eosinophils Absolute: 0.2 10*3/uL (ref 0.0–0.5)
HCT: 44.5 % (ref 38.7–49.9)
LYMPH%: 6.5 % — ABNORMAL LOW (ref 14.0–48.0)
MCH: 20.3 pg — ABNORMAL LOW (ref 28.0–33.4)
MCV: 68 fL — ABNORMAL LOW (ref 82–98)
MONO#: 0.8 10*3/uL (ref 0.1–0.9)
MONO%: 4 % (ref 0.0–13.0)
NEUT%: 88.1 % — ABNORMAL HIGH (ref 40.0–80.0)
Platelets: 277 10*3/uL (ref 145–400)
RBC: 6.56 10*6/uL — ABNORMAL HIGH (ref 4.20–5.70)

## 2013-02-10 LAB — CHCC SATELLITE - SMEAR

## 2013-02-10 NOTE — Progress Notes (Signed)
DIAGNOSIS:  Polycythemia vera, JAK2 positive.  CURRENT THERAPY: 1. Phlebotomy to maintain hematocrit of less than 45%. 2. Aspirin 81 mg p.o. daily.  INTERIM HISTORY:  Benjamin [name] presents today for an office followup visit.  Overall he reports that he is doing quite well.  He's not reporting any new problems.  We like to keep his hematocrit below 45%.  His last ferritin back in April was 4.  His iron was 21 with 6% saturation.  He is clearly iron deficient.  He's not reporting any headaches he does not report any pain in his hands or feet.  He does have some pruritus after a hot shower.  He has a good appetite.  He denies any nausea, vomiting, diarrhea or constipation.  He denies any chest pain, shortness of breath or cough.  He denies any fevers, chills or night sweats.  He denies any abdominal pain.  He denies any obvious or abnormal bleeding he denies any lower leg swelling.  He denies any headaches, visual changes or rashes.  Review of Systems: Constitutional:Negative for malaise/fatigue, fever, chills, weight loss, diaphoresis, activity change, appetite change, and unexpected weight change.  HEENT: Negative for double vision, blurred vision, visual loss, ear pain, tinnitus, congestion, rhinorrhea, epistaxis sore throat or sinus disease, oral pain/lesion, tongue soreness Respiratory: Negative for cough, chest tightness, shortness of breath, wheezing and stridor.  Cardiovascular: Negative for chest pain, palpitations, leg swelling, orthopnea, PND, DOE or claudication Gastrointestinal: Negative for nausea, vomiting, abdominal pain, diarrhea, constipation, blood in stool, melena, hematochezia, abdominal distention, anal bleeding, rectal pain, anorexia and hematemesis.  Genitourinary: Negative for dysuria, frequency, hematuria,  Musculoskeletal: Negative for myalgias, back pain, joint swelling, arthralgias and gait problem.  Skin: Negative for rash, color change, pallor and wound.  Neurological:.  Negative for dizziness/light-headedness, tremors, seizures, syncope, facial asymmetry, speech difficulty, weakness, numbness, headaches and paresthesias.  Hematological: Negative for adenopathy. Does not bruise/bleed easily.  Psychiatric/Behavioral:  Negative for depression, no loss of interest in normal activity or change in sleep pattern.   Physical Exam: This is a pleasant 62 year old white gentleman who is well-developed well-nourished in no obvious distress Vitals: Temperature 97.5 degrees pulse 71 respirations 18 blood pressure 127/68 weight 173 pounds HEENT reveals a normocephalic, atraumatic skull, no scleral icterus, no oral lesions  Neck is supple without any cervical or supraclavicular adenopathy.  Lungs are clear to auscultation bilaterally. There are no wheezes, rales or rhonci Cardiac is regular rate and rhythm with a normal S1 and S2. There are no murmurs, rubs, or bruits.  Abdomen is soft with good bowel sounds, there is no palpable mass. There is no palpable hepatosplenomegaly. There is no palpable fluid wave.  Musculoskeletal no tenderness of the spine, ribs, or hips.  Extremities there are no clubbing, cyanosis, or edema.  Skin no petechia, purpura or ecchymosis Neurologic is nonfocal.  Laboratory Data: White count 20.1 hemoglobin 13.3 hematocrit 44.5 platelets 277,000  Current Outpatient Prescriptions on File Prior to Visit  Medication Sig Dispense Refill  . Ascorbic Acid (VITAMIN C) 1000 MG tablet Take 1,000 mg by mouth daily.       Marland Kitchen aspirin 325 MG tablet Take 325 mg by mouth daily.        Marland Kitchen CIALIS 10 MG tablet Take 5 mg by mouth as needed.       Marland Kitchen METROGEL 1 % gel 1 application daily.       . minocycline (MINOCIN,DYNACIN) 100 MG capsule Take by mouth 2 (two) times daily.       Marland Kitchen  NON FORMULARY as needed. Horse Chestnut tab      . Omega-3 Fatty Acids (FISH OIL) 1000 MG CAPS Take by mouth every morning.      . Sulfacetamide Sodium 10 % SUSP Apply topically every  morning.       . Vitamin E 400 UNITS TABS Take by mouth every morning.       No current facility-administered medications on file prior to visit.   Assessment/Plan: This is a pleasant 62 year old white gentleman with the following issues:  #1.  Polycythemia vera.  His hematocrit is 44%.  We will hold off on a phlebotomy today.  #2.  Followup.  We will follow back up with [name] in 2 months but before then should there be questions or concerns.

## 2013-04-10 ENCOUNTER — Ambulatory Visit (HOSPITAL_BASED_OUTPATIENT_CLINIC_OR_DEPARTMENT_OTHER): Payer: BC Managed Care – PPO | Admitting: Hematology & Oncology

## 2013-04-10 ENCOUNTER — Other Ambulatory Visit (HOSPITAL_BASED_OUTPATIENT_CLINIC_OR_DEPARTMENT_OTHER): Payer: BC Managed Care – PPO | Admitting: Lab

## 2013-04-10 ENCOUNTER — Ambulatory Visit: Payer: BC Managed Care – PPO

## 2013-04-10 VITALS — BP 134/62 | HR 74 | Temp 98.1°F | Resp 18 | Ht 72.0 in | Wt 172.0 lb

## 2013-04-10 DIAGNOSIS — N529 Male erectile dysfunction, unspecified: Secondary | ICD-10-CM

## 2013-04-10 DIAGNOSIS — D45 Polycythemia vera: Secondary | ICD-10-CM

## 2013-04-10 LAB — CBC WITH DIFFERENTIAL (CANCER CENTER ONLY)
BASO#: 0.1 10*3/uL (ref 0.0–0.2)
EOS%: 1.1 % (ref 0.0–7.0)
Eosinophils Absolute: 0.2 10*3/uL (ref 0.0–0.5)
HCT: 46.8 % (ref 38.7–49.9)
HGB: 14.2 g/dL (ref 13.0–17.1)
LYMPH%: 5.9 % — ABNORMAL LOW (ref 14.0–48.0)
MCH: 20.9 pg — ABNORMAL LOW (ref 28.0–33.4)
MCHC: 30.3 g/dL — ABNORMAL LOW (ref 32.0–35.9)
MCV: 69 fL — ABNORMAL LOW (ref 82–98)
MONO%: 3.3 % (ref 0.0–13.0)
NEUT#: 15.8 10*3/uL — ABNORMAL HIGH (ref 1.5–6.5)
NEUT%: 89.2 % — ABNORMAL HIGH (ref 40.0–80.0)
RBC: 6.79 10*6/uL — ABNORMAL HIGH (ref 4.20–5.70)

## 2013-04-10 LAB — CHCC SATELLITE - SMEAR

## 2013-04-10 MED ORDER — TADALAFIL 10 MG PO TABS: 5.0000 mg | ORAL_TABLET | ORAL | Status: DC | PRN

## 2013-04-10 NOTE — Progress Notes (Signed)
Will rescheduled per Dr. Myna Hidalgo

## 2013-04-10 NOTE — Progress Notes (Signed)
This office note has been dictated.

## 2013-04-11 NOTE — Progress Notes (Signed)
CC:   Benjamin [name], M.D.  DIAGNOSIS:  Polycythemia vera, JAK2 positive.  CURRENT THERAPY: 1. Phlebotomy to maintain hematocrit less than 45%. 2. Aspirin 81 mg p.o. daily.  INTERIM HISTORY:  Benjamin [name] comes in for followup.  He is doing quite well.  He has had no problems since we last saw him.  He did have a little bit of a sore throat last week.  I did look at his throat, and it looked like he had an aphthous ulcer on the soft palate.  This seems to be improving.  He clearly is iron deficient.  When we last checked his ferritin back in April, the ferritin was 4.  He has had no headaches.  He has had no nausea or vomiting.  There has been no fever, sweats or chills.  There has been no pain in his hands or feet.  PHYSICAL EXAMINATION:  General:  This is a well-developed, well- nourished white gentleman in no obvious distress.  Vital signs: Temperature of 98.1, pulse 74, respiratory rate 18, blood pressure 134/62.  Weight is 172.  Head and neck:  Normocephalic, atraumatic skull.  There are no ocular or oral lesions.  There are no palpable cervical or supraclavicular lymph nodes.  Lungs:  Clear bilaterally. Cardiac:  Regular rate and rhythm with a normal S1 and S2.  There are no murmurs, rubs or bruits.  Abdomen:  Soft.  He has good bowel sounds. There is no fluid wave.  There is no palpable hepatosplenomegaly. Extremities:  No clubbing, cyanosis or edema.  Neurological:  No focal neurological deficits.  LABORATORY STUDIES:  White cell count is 17.7, hemoglobin 14.5, hematocrit 46.8, platelet count 275.  MCV is 69.  IMPRESSION:  Benjamin [name] is a 62 year old gentleman with polycythemia. We will go ahead and phlebotomize him.  We will do it next week.  This will be according to his schedule.  I will plan to get him back to see me in another 2 months.    ______________________________ Benjamin [name], M.D. PRE/MEDQ  D:  04/10/2013  T:  04/11/2013  Job:  [id number]

## 2013-04-14 ENCOUNTER — Telehealth: Payer: Self-pay | Admitting: Hematology & Oncology

## 2013-04-14 NOTE — Telephone Encounter (Signed)
Pt moved 8-25 phlebotomy to 8-28

## 2013-04-20 ENCOUNTER — Ambulatory Visit (HOSPITAL_BASED_OUTPATIENT_CLINIC_OR_DEPARTMENT_OTHER): Payer: BC Managed Care – PPO

## 2013-04-20 VITALS — BP 120/64 | HR 79 | Temp 97.2°F | Resp 20

## 2013-04-20 DIAGNOSIS — D45 Polycythemia vera: Secondary | ICD-10-CM

## 2013-04-20 NOTE — Patient Instructions (Signed)

## 2013-04-20 NOTE — Progress Notes (Signed)
Benjamin [name] presents today for phlebotomy per MD orders. Phlebotomy procedure started at 1425 and ended at 1432. Approximately 500 mls removed. Patient observed for 30 minutes after procedure without any incident. Patient tolerated procedure well. IV needle removed intact.

## 2013-06-19 ENCOUNTER — Other Ambulatory Visit (HOSPITAL_BASED_OUTPATIENT_CLINIC_OR_DEPARTMENT_OTHER): Payer: BC Managed Care – PPO | Admitting: Lab

## 2013-06-19 ENCOUNTER — Ambulatory Visit (HOSPITAL_BASED_OUTPATIENT_CLINIC_OR_DEPARTMENT_OTHER): Payer: BC Managed Care – PPO | Admitting: Hematology & Oncology

## 2013-06-19 VITALS — BP 136/61 | HR 74 | Temp 98.1°F | Resp 18 | Ht 72.0 in | Wt 173.0 lb

## 2013-06-19 DIAGNOSIS — N529 Male erectile dysfunction, unspecified: Secondary | ICD-10-CM

## 2013-06-19 DIAGNOSIS — D45 Polycythemia vera: Secondary | ICD-10-CM

## 2013-06-19 LAB — CBC WITH DIFFERENTIAL (CANCER CENTER ONLY)
BASO%: 0.5 % (ref 0.0–2.0)
EOS%: 1 % (ref 0.0–7.0)
LYMPH#: 1.1 10*3/uL (ref 0.9–3.3)
MCHC: 29.9 g/dL — ABNORMAL LOW (ref 32.0–35.9)
NEUT#: 17.1 10*3/uL — ABNORMAL HIGH (ref 1.5–6.5)
Platelets: 275 10*3/uL (ref 145–400)
RDW: 22.3 % — ABNORMAL HIGH (ref 11.1–15.7)
WBC: 19.2 10*3/uL — ABNORMAL HIGH (ref 4.0–10.0)

## 2013-06-19 LAB — IRON AND TIBC CHCC
Iron: 27 ug/dL — ABNORMAL LOW (ref 42–163)
TIBC: 365 ug/dL (ref 202–409)

## 2013-06-19 LAB — FERRITIN CHCC: Ferritin: 11 ng/ml — ABNORMAL LOW (ref 22–316)

## 2013-06-19 NOTE — Progress Notes (Signed)
This office note has been dictated.

## 2013-06-20 NOTE — Progress Notes (Signed)
CC:   [name], M.D.  DIAGNOSIS:  Polycythemia vera - JAK2 positive.  CURRENT THERAPY: 1. Phlebotomy to maintain hematocrit less than 45%. 2. Aspirin 81 mg p.o. daily.  INTERIM HISTORY:  [name] comes in for his followup.  We last saw him back in August.  We phlebotomized him at that point in time.  His ferritin was down to 4 going back to April.  He feels well.  He is having no problems with headaches.  There is no blurred vision.  He has no pain in his hands or feet.  There has been no leg swelling.  He has had no rashes.  He has had no change in medications.  He does have the rosacea, which he uses some topical gels with.  PHYSICAL EXAMINATION:  General:  This is a well-developed, well- nourished, white gentleman in no obvious distress.  Vital signs: Temperature 98.1, pulse 74, respiratory rate 18, blood pressure 136/61. Weight is 173 pounds.  Head and neck:  Normocephalic, atraumatic skull. There are no ocular or oral lesions.  He has no conjunctival inflammation.  There is no facial plethora.  There is no adenopathy in the neck.  Lungs:  Clear bilaterally.  Cardiac:  Regular rate and rhythm with a normal S1 and S2.  There are no murmurs, rubs or bruits. Abdomen:  Soft.  He has good bowel sounds.  There is no fluid wave. There is no palpable abdominal mass.  There is no palpable hepatosplenomegaly.  Extremities:  Show no clubbing, cyanosis or edema. He has good range of motion of his joints.  He has good muscle strength in upper and lower extremities.  Neurological:  Shows no focal neurological deficits.  Skin:  Shows no ruddy complexion of the skin. He does have some rosacea changes on his face.  LABORATORY STUDIES:  White cell count is 19.2, hemoglobin 14, hematocrit 47, platelet count 275.  MCV is 68.  IMPRESSION:  [name] is a nice 62 year old gentleman with polycythemia vera.  Again, he is JAK2 positive.  We are controlling his blood counts pretty  well.  White cell count does tend to go up and down a little bit.  We will go ahead and phlebotomize him next week.  I think we can probably get him through the holidays if we do phlebotomize him next week.   ______________________________ Josph Macho, M.D. PRE/MEDQ  D:  06/19/2013  T:  06/20/2013  Job:  [id number]

## 2013-06-30 ENCOUNTER — Ambulatory Visit (HOSPITAL_BASED_OUTPATIENT_CLINIC_OR_DEPARTMENT_OTHER): Payer: BC Managed Care – PPO

## 2013-06-30 VITALS — BP 143/70 | HR 76 | Temp 96.7°F | Resp 16

## 2013-06-30 DIAGNOSIS — D45 Polycythemia vera: Secondary | ICD-10-CM

## 2013-06-30 NOTE — Progress Notes (Signed)
Benjamin [name] presents today for phlebotomy per MD orders. Phlebotomy procedure started at 1525 and ended at 1535. 500 ml removed. Patient observed for 30 minutes after procedure without any incident. Patient tolerated procedure well. IV needle removed intact.

## 2013-08-03 ENCOUNTER — Encounter (HOSPITAL_BASED_OUTPATIENT_CLINIC_OR_DEPARTMENT_OTHER): Payer: Self-pay | Admitting: *Deleted

## 2013-08-03 NOTE — Progress Notes (Signed)
Hx polycythemia-has freq phlebotomies

## 2013-08-07 ENCOUNTER — Other Ambulatory Visit: Payer: Self-pay | Admitting: Orthopedic Surgery

## 2013-08-07 NOTE — H&P (Signed)
  Benjamin [name] is an 62 y.o. male.   Chief Complaint: c/o progressive contracture left hand HPI:. 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 He now has a 65 degree flexion contracture of the left small finger PIP joint with a large ulnar palmar cord. He has no MP contractures. He has a strong family history of Dupuytren's palmar fibromatosis. He has a brother who was treated at [facility] with needle aponeurotomy and has had a significant recurrence of his fibromatosis.     Past Medical History  Diagnosis Date  . P. vera 08/19/2011  . Polycythemia   . Anemia   . DVT (deep venous thrombosis)     hx -lt leg-dx polycythemia    Past Surgical History  Procedure Laterality Date  . Wisdom tooth extraction    . Finger arthroplasty  1998    rt index  . Inguinal hernia repair  2007    left  . Inguinal hernia repair  2009    right  . Colonoscopy      History reviewed. No pertinent family history. Social History:  reports that he has never smoked. He does not have any smokeless tobacco history on file. He reports that he drinks alcohol. He reports that he does not use illicit drugs.  Allergies: No Known Allergies  No prescriptions prior to admission    No results found for this or any previous visit (from the past 48 hour(s)).  No results found.   Pertinent items are noted in HPI.  Height 6' (1.829 m), weight 78.472 kg (173 lb).  General appearance: alert Head: Normocephalic, without obvious abnormality Neck: supple, symmetrical, trachea midline Resp: clear to auscultation bilaterally Cardio: regular rate and rhythm GI: normal findings: bowel sounds normal Extremities: He has no Heberden's or Bouchard's nodes. He has a 60  degree fixed flexion contracture of his left small finger PIP joint and rests at 65 degrees flexion. He can further flex beyond 95 degrees. He can close his fingers tight to the palm. He has compensatory hyperextension of the MP joint that allows his palm to be flat on the table. He has a thick nodule at the lateral fascial sheath and a retrovascular cord. He does not have fixed extension contracture of the DIP joint.  X-ray of his finger 4 views demonstrates minor degenerative change at the IP joints. His PIP is congruous. Pulses: 2+ and symmetric Skin: normal Neurologic: Grossly normal    Assessment/Plan Impression: Dupuytren's contracture left hand involving the palm and small finger.  Plan: To the OR for excision Dupuytren's contracture left hand.The procedure, risks,benefits and post-op course were discussed with the patient at length and they were in agreement with the plan.  DASNOIT,Benjamin [name] 08/07/2013, 3:27 PM   H&P documentation: 08/08/2013  -History and Physical Reviewed  -Patient has been re-examined  -No change in the plan of care  Benjamin Forster, MD

## 2013-08-08 ENCOUNTER — Ambulatory Visit (HOSPITAL_BASED_OUTPATIENT_CLINIC_OR_DEPARTMENT_OTHER)
Admission: RE | Admit: 2013-08-08 | Discharge: 2013-08-08 | Disposition: A | Discharge status: Home / Self Care | Payer: BC Managed Care – PPO | Source: Ambulatory Visit | Attending: Orthopedic Surgery | Admitting: Orthopedic Surgery

## 2013-08-08 ENCOUNTER — Encounter (HOSPITAL_BASED_OUTPATIENT_CLINIC_OR_DEPARTMENT_OTHER): Payer: Self-pay | Admitting: Certified Registered"

## 2013-08-08 ENCOUNTER — Ambulatory Visit (HOSPITAL_BASED_OUTPATIENT_CLINIC_OR_DEPARTMENT_OTHER): Payer: BC Managed Care – PPO | Admitting: Anesthesiology

## 2013-08-08 ENCOUNTER — Encounter (HOSPITAL_BASED_OUTPATIENT_CLINIC_OR_DEPARTMENT_OTHER)
Admission: RE | Disposition: A | Discharge status: Home / Self Care | Payer: Self-pay | Source: Ambulatory Visit | Attending: Orthopedic Surgery

## 2013-08-08 ENCOUNTER — Encounter (HOSPITAL_BASED_OUTPATIENT_CLINIC_OR_DEPARTMENT_OTHER): Payer: BC Managed Care – PPO | Admitting: Anesthesiology

## 2013-08-08 DIAGNOSIS — M720 Palmar fascial fibromatosis [Dupuytren]: Secondary | ICD-10-CM | POA: Insufficient documentation

## 2013-08-08 DIAGNOSIS — D45 Polycythemia vera: Secondary | ICD-10-CM | POA: Insufficient documentation

## 2013-08-08 DIAGNOSIS — Z86718 Personal history of other venous thrombosis and embolism: Secondary | ICD-10-CM | POA: Insufficient documentation

## 2013-08-08 DIAGNOSIS — D649 Anemia, unspecified: Secondary | ICD-10-CM | POA: Insufficient documentation

## 2013-08-08 HISTORY — DX: Acute embolism and thrombosis of unspecified deep veins of unspecified lower extremity: I82.409

## 2013-08-08 HISTORY — DX: Anemia, unspecified: D64.9

## 2013-08-08 HISTORY — DX: Secondary polycythemia: D75.1

## 2013-08-08 HISTORY — PX: DUPUYTREN CONTRACTURE RELEASE: SHX1478

## 2013-08-08 SURGERY — RELEASE, DUPUYTREN CONTRACTURE
Anesthesia: Regional | Site: Hand | Laterality: Left

## 2013-08-08 MED ORDER — PROPOFOL 10 MG/ML IV BOLUS
INTRAVENOUS | Status: DC | PRN
  Administered 2013-08-08: 150 mg via INTRAVENOUS

## 2013-08-08 MED ORDER — LIDOCAINE HCL (CARDIAC) 20 MG/ML IV SOLN
INTRAVENOUS | Status: DC | PRN
  Administered 2013-08-08: 50 mg via INTRAVENOUS

## 2013-08-08 MED ORDER — MIDAZOLAM HCL 2 MG/2ML IJ SOLN
INTRAMUSCULAR | Status: AC
  Filled 2013-08-08: qty 2

## 2013-08-08 MED ORDER — HYDROMORPHONE HCL PF 1 MG/ML IJ SOLN: 0.2500 mg | INTRAMUSCULAR | Status: DC | PRN

## 2013-08-08 MED ORDER — CHLORHEXIDINE GLUCONATE 4 % EX LIQD: 60.0000 mL | Freq: Once | CUTANEOUS | Status: DC

## 2013-08-08 MED ORDER — ROPIVACAINE HCL 5 MG/ML IJ SOLN
INTRAMUSCULAR | Status: DC | PRN
  Administered 2013-08-08: 40 mL via PERINEURAL

## 2013-08-08 MED ORDER — FENTANYL CITRATE 0.05 MG/ML IJ SOLN
INTRAMUSCULAR | Status: DC | PRN
  Administered 2013-08-08: 50 ug via INTRAVENOUS

## 2013-08-08 MED ORDER — OXYCODONE-ACETAMINOPHEN 5-325 MG PO TABS: 1.0000 | ORAL_TABLET | ORAL | Status: DC | PRN

## 2013-08-08 MED ORDER — DEXAMETHASONE SODIUM PHOSPHATE 10 MG/ML IJ SOLN
INTRAMUSCULAR | Status: DC | PRN
  Administered 2013-08-08: 10 mg via INTRAVENOUS

## 2013-08-08 MED ORDER — MIDAZOLAM HCL 2 MG/2ML IJ SOLN
1.0000 mg | INTRAMUSCULAR | Status: DC | PRN
  Administered 2013-08-08: 2 mg via INTRAVENOUS

## 2013-08-08 MED ORDER — CEFAZOLIN SODIUM-DEXTROSE 2-3 GM-% IV SOLR: 2.0000 g | Freq: Once | INTRAVENOUS | Status: DC

## 2013-08-08 MED ORDER — OXYCODONE HCL 5 MG PO TABS: 5.0000 mg | ORAL_TABLET | Freq: Once | ORAL | Status: DC | PRN

## 2013-08-08 MED ORDER — FENTANYL CITRATE 0.05 MG/ML IJ SOLN
50.0000 ug | INTRAMUSCULAR | Status: DC | PRN
  Administered 2013-08-08: 100 ug via INTRAVENOUS

## 2013-08-08 MED ORDER — SILVER SULFADIAZINE 1 % EX CREA
TOPICAL_CREAM | CUTANEOUS | Status: AC
  Filled 2013-08-08: qty 85

## 2013-08-08 MED ORDER — CEPHALEXIN 500 MG PO CAPS: 500.0000 mg | ORAL_CAPSULE | Freq: Three times a day (TID) | ORAL | Status: DC

## 2013-08-08 MED ORDER — SILVER SULFADIAZINE 1 % EX CREA
TOPICAL_CREAM | CUTANEOUS | Status: DC | PRN
  Administered 2013-08-08: 1 via TOPICAL

## 2013-08-08 MED ORDER — FENTANYL CITRATE 0.05 MG/ML IJ SOLN
INTRAMUSCULAR | Status: AC
  Filled 2013-08-08: qty 2

## 2013-08-08 MED ORDER — LACTATED RINGERS IV SOLN
INTRAVENOUS | Status: DC
  Administered 2013-08-08 (×2): via INTRAVENOUS

## 2013-08-08 MED ORDER — OXYCODONE HCL 5 MG/5ML PO SOLN
5.0000 mg | Freq: Once | ORAL | Status: DC | PRN
Start: 2013-08-08 — End: 2013-08-08

## 2013-08-08 MED ORDER — ONDANSETRON HCL 4 MG/2ML IJ SOLN
INTRAMUSCULAR | Status: DC | PRN
  Administered 2013-08-08: 4 mg via INTRAVENOUS

## 2013-08-08 SURGICAL SUPPLY — 51 items
BANDAGE ADH SHEER 1  50/CT (GAUZE/BANDAGES/DRESSINGS) IMPLANT
BANDAGE ELASTIC 3 VELCRO ST LF (GAUZE/BANDAGES/DRESSINGS) IMPLANT
BLADE MINI RND TIP GREEN BEAV (BLADE) ×2 IMPLANT
BLADE SURG 15 STRL LF DISP TIS (BLADE) ×1 IMPLANT
BLADE SURG 15 STRL SS (BLADE) ×2
BNDG CMPR 9X4 STRL LF SNTH (GAUZE/BANDAGES/DRESSINGS) ×1
BNDG COHESIVE 3X5 TAN STRL LF (GAUZE/BANDAGES/DRESSINGS) IMPLANT
BNDG CONFORM 3 STRL LF (GAUZE/BANDAGES/DRESSINGS) IMPLANT
BNDG ESMARK 4X9 LF (GAUZE/BANDAGES/DRESSINGS) ×2 IMPLANT
BNDG GAUZE ELAST 4 BULKY (GAUZE/BANDAGES/DRESSINGS) ×4 IMPLANT
BRUSH SCRUB EZ PLAIN DRY (MISCELLANEOUS) ×2 IMPLANT
CORDS BIPOLAR (ELECTRODE) ×2 IMPLANT
COVER MAYO STAND STRL (DRAPES) ×2 IMPLANT
COVER TABLE BACK 60X90 (DRAPES) ×2 IMPLANT
CUFF TOURNIQUET SINGLE 18IN (TOURNIQUET CUFF) ×1 IMPLANT
DECANTER SPIKE VIAL GLASS SM (MISCELLANEOUS) IMPLANT
DRAPE EXTREMITY T 121X128X90 (DRAPE) ×2 IMPLANT
DRAPE SURG 17X23 STRL (DRAPES) ×2 IMPLANT
DRSG EMULSION OIL 3X3 NADH (GAUZE/BANDAGES/DRESSINGS) ×2 IMPLANT
GLOVE BIOGEL M STRL SZ7.5 (GLOVE) ×2 IMPLANT
GLOVE BIOGEL PI IND STRL 7.0 (GLOVE) IMPLANT
GLOVE BIOGEL PI INDICATOR 7.0 (GLOVE) ×2
GLOVE ECLIPSE 6.5 STRL STRAW (GLOVE) ×1 IMPLANT
GLOVE ORTHO TXT STRL SZ7.5 (GLOVE) ×2 IMPLANT
GOWN BRE IMP PREV XXLGXLNG (GOWN DISPOSABLE) ×4 IMPLANT
GOWN PREVENTION PLUS XLARGE (GOWN DISPOSABLE) ×2 IMPLANT
LOOP VESSEL MAXI BLUE (MISCELLANEOUS) IMPLANT
NDL HYPO 25X1 1.5 SAFETY (NEEDLE) IMPLANT
NEEDLE 27GAX1X1/2 (NEEDLE) IMPLANT
NEEDLE HYPO 25X1 1.5 SAFETY (NEEDLE) IMPLANT
NS IRRIG 1000ML POUR BTL (IV SOLUTION) ×2 IMPLANT
PACK BASIN DAY SURGERY FS (CUSTOM PROCEDURE TRAY) ×2 IMPLANT
PAD CAST 3X4 CTTN HI CHSV (CAST SUPPLIES) ×1 IMPLANT
PADDING CAST ABS 4INX4YD NS (CAST SUPPLIES)
PADDING CAST ABS COTTON 4X4 ST (CAST SUPPLIES) IMPLANT
PADDING CAST COTTON 3X4 STRL (CAST SUPPLIES) ×2
SPLINT PLASTER CAST XFAST 3X15 (CAST SUPPLIES) ×8 IMPLANT
SPLINT PLASTER XTRA FASTSET 3X (CAST SUPPLIES) ×8
SPONGE GAUZE 4X4 12PLY (GAUZE/BANDAGES/DRESSINGS) ×2 IMPLANT
STOCKINETTE 4X48 STRL (DRAPES) ×2 IMPLANT
STRIP CLOSURE SKIN 1/2X4 (GAUZE/BANDAGES/DRESSINGS) IMPLANT
SUT ETHILON 5 0 P 3 18 (SUTURE) ×2
SUT NYLON ETHILON 5-0 P-3 1X18 (SUTURE) ×2 IMPLANT
SUT SILK 4 0 PS 2 (SUTURE) ×2 IMPLANT
SUT VIC AB 4-0 P2 18 (SUTURE) IMPLANT
SYR 3ML 23GX1 SAFETY (SYRINGE) IMPLANT
SYR BULB 3OZ (MISCELLANEOUS) ×2 IMPLANT
SYR CONTROL 10ML LL (SYRINGE) IMPLANT
TOWEL OR 17X24 6PK STRL BLUE (TOWEL DISPOSABLE) ×4 IMPLANT
TRAY DSU PREP LF (CUSTOM PROCEDURE TRAY) ×2 IMPLANT
UNDERPAD 30X30 INCONTINENT (UNDERPADS AND DIAPERS) ×2 IMPLANT

## 2013-08-08 NOTE — Anesthesia Postprocedure Evaluation (Signed)
  Anesthesia Post-op Note  Patient: Benjamin [name]  Procedure(s) Performed: Procedure(s): DUPUYTREN CONTRACTURE RELEASE LEFT HAND (Left)  Patient Location: PACU  Anesthesia Type:General and block  Level of Consciousness: awake and alert   Airway and Oxygen Therapy: Patient Spontanous Breathing  Post-op Pain: none  Post-op Assessment: Post-op Vital signs reviewed, Patient's Cardiovascular Status Stable and Respiratory Function Stable  Post-op Vital Signs: Reviewed  Filed Vitals:   08/08/13 1700  BP: 129/70  Pulse: 83  Temp:   Resp: 17    Complications: No apparent anesthesia complications

## 2013-08-08 NOTE — Anesthesia Preprocedure Evaluation (Addendum)
Anesthesia Evaluation  Patient identified by MRN, date of birth, ID band Patient awake    Reviewed: Allergy & Precautions, H&P , NPO status , Patient's Chart, lab work & pertinent test results  Airway Mallampati: I TM Distance: >3 FB Neck ROM: Full    Dental no notable dental hx. (+) Teeth Intact and Dental Advisory Given   Pulmonary neg pulmonary ROS,  breath sounds clear to auscultation  Pulmonary exam normal       Cardiovascular negative cardio ROS  Rhythm:Regular Rate:Normal     Neuro/Psych negative neurological ROS  negative psych ROS   GI/Hepatic negative GI ROS, Neg liver ROS,   Endo/Other  negative endocrine ROS  Renal/GU negative Renal ROS  negative genitourinary   Musculoskeletal   Abdominal   Peds  Hematology negative hematology ROS (+) Blood dyscrasia, anemia , Polycythemia vera   Anesthesia Other Findings   Reproductive/Obstetrics negative OB ROS                          Anesthesia Physical Anesthesia Plan  ASA: II  Anesthesia Plan: General and Regional   Post-op Pain Management:    Induction: Intravenous  Airway Management Planned: LMA  Additional Equipment:   Intra-op Plan:   Post-operative Plan: Extubation in OR  Informed Consent: I have reviewed the patients History and Physical, chart, labs and discussed the procedure including the risks, benefits and alternatives for the proposed anesthesia with the patient or authorized representative who has indicated his/her understanding and acceptance.   Dental advisory given  Plan Discussed with: CRNA and Surgeon  Anesthesia Plan Comments:         Anesthesia Quick Evaluation

## 2013-08-08 NOTE — Transfer of Care (Signed)
Immediate Anesthesia Transfer of Care Note  Patient: Benjamin [name]  Procedure(s) Performed: Procedure(s): DUPUYTREN CONTRACTURE RELEASE LEFT HAND (Left)  Patient Location: PACU  Anesthesia Type:General and GA combined with regional for post-op pain  Level of Consciousness: sedated  Airway & Oxygen Therapy: Patient Spontanous Breathing and Patient connected to face mask oxygen  Post-op Assessment: Report given to PACU RN and Post -op Vital signs reviewed and stable  Post vital signs: Reviewed and stable  Complications: No apparent anesthesia complications

## 2013-08-08 NOTE — Brief Op Note (Signed)
08/08/2013  4:59 PM  PATIENT:  Benjamin [name]  62 y.o. male  PRE-OPERATIVE DIAGNOSIS:  LEFT DUPUYTREN'S   POST-OPERATIVE DIAGNOSIS:  left dupuytrens  PROCEDURE:  Procedure(s): DUPUYTREN CONTRACTURE RELEASE LEFT HAND (Left)  SURGEON:  Surgeon(s) and Role:    * [name]., MD - Primary  PHYSICIAN ASSISTANT:   ASSISTANTS: Mallory Shirk.A-C   ANESTHESIA:   general  EBL:  Total I/O In: 1300 [I.V.:1300] Out: -   BLOOD ADMINISTERED:none  DRAINS: none   LOCAL MEDICATIONS USED:  Ropivacaine plexus block  SPECIMEN:  Biopsy / Limited Resection  DISPOSITION OF SPECIMEN:  PATHOLOGY  COUNTS:  YES  TOURNIQUET:   Total Tourniquet Time Documented: Upper Arm (Left) - 57 minutes Total: Upper Arm (Left) - 57 minutes   DICTATION: .Other Dictation: Dictation Number [id number]  PLAN OF CARE: Discharge to home after PACU  PATIENT DISPOSITION:  PACU - hemodynamically stable.   Delay start of Pharmacological VTE agent (>24hrs) due to surgical blood loss or risk of bleeding: not applicable

## 2013-08-08 NOTE — Anesthesia Procedure Notes (Addendum)
Anesthesia Regional Block:  Supraclavicular block  Pre-Anesthetic Checklist: ,, timeout performed, Correct Patient, Correct Site, Correct Laterality, Correct Procedure, Correct Position, site marked, Risks and benefits discussed, pre-op evaluation, post-op pain management  Laterality: Left  Prep: Maximum Sterile Barrier Precautions used and chloraprep       Needles:  Injection technique: Single-shot  Needle Type: Echogenic Stimulator Needle     Needle Length: 5cm 5 cm Needle Gauge: 22 and 22 G    Additional Needles:  Procedures: ultrasound guided (picture in chart) Supraclavicular block Narrative:  Start time: 08/08/2013 1:20 PM End time: 08/08/2013 1:28 PM Injection made incrementally with aspirations every 5 mL. Anesthesiologist: Fitzgerald,MD  Additional Notes: 2% Lidocaine skin wheel. Intercostobrachial block with 5cc of 0.5% Ropivicaine plain.   Procedure Name: LMA Insertion Date/Time: 08/08/2013 3:41 PM Performed by: Valentin Benney Pre-anesthesia Checklist: Patient identified, Emergency Drugs available, Suction available and Patient being monitored Patient Re-evaluated:Patient Re-evaluated prior to inductionOxygen Delivery Method: Circle System Utilized Preoxygenation: Pre-oxygenation with 100% oxygen Intubation Type: IV induction Ventilation: Mask ventilation without difficulty LMA: LMA inserted LMA Size: 4.0 Number of attempts: 1 Airway Equipment and Method: bite block Placement Confirmation: positive ETCO2 Tube secured with: Tape Dental Injury: Teeth and Oropharynx as per pre-operative assessment

## 2013-08-08 NOTE — Op Note (Signed)
[id number] 

## 2013-08-08 NOTE — Progress Notes (Signed)
Assisted Dr. Fitzgerald with left, ultrasound guided, supraclavicular block. Side rails up, monitors on throughout procedure. See vital signs in flow sheet. Tolerated Procedure well. 

## 2013-08-09 NOTE — Op Note (Signed)
NAME:  Benjamin, [name] NO.:  [id number]  MEDICAL RECORD NO.:  [id number]  LOCATION:                                 FACILITY:  PHYSICIAN:  Katy Fitch. Fahd Galea, M.D.      DATE OF BIRTH:  DATE OF PROCEDURE:  08/08/2013 DATE OF DISCHARGE:                              OPERATIVE REPORT   PREOPERATIVE DIAGNOSIS:  Dupuytren's palmar fibromatosis, left hand involving small finger, proximal middle phalangeal segments with a very large lateral fascial sheet and abductors digiti minimi fascial sheet with extension to the pretendinous fibers in the palm.  POSTOPERATIVE DIAGNOSIS:  Dupuytren's palmar fibromatosis, left hand involving small finger, proximal middle phalangeal segments with a very large lateral fascial sheet and abductors digiti minimi fascial sheet with extension to the pretendinous fibers in the palm.  OPERATION:  Resection of complex Dupuytren's palmar fibromatosis from left small finger and palm.  OPERATING SURGEON:  Katy Fitch. Ashara Lounsbury, MD.  ASSISTANT:  Marveen Reeks Dasnoit, PA-C  ANESTHESIA:  General by LMA supplemented by ropivacaine plexus block for perioperative comfort, supervised anesthesiologist, Dr. Sampson Goon.  INDICATIONS:  Benjamin [name] is a 62 year old gentleman referred through the courtesy of Dr. Erenest Rasher for management of a Dupuytren's palmar fibromatosis.  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  He enjoys playing string instruments and was having quite a bit of difficulty due to progressive flexion contracture developing in the left small finger PIP joint.  He presented for consult.  He was noted to have a very thick nodule/cord on the ulnar aspect of his small finger.  We recommended that he consider excision.  After detailed informed consent during which we advised him that he will get more palmar fibromatosis over time due  to the fact this is a genetically driven predicament and he does have a strong family history, we proceeded this time.  He understands he will likely develop disease in the right hand over time and will have more disease in the left hand requiring possible future intervention.  Preoperatively, he was interviewed by Dr. Sampson Goon who recommended general anesthesia by LMA technique supplemented by a plexus block.  This was placed in the holding area without complication leading to excellent anesthesia of the left arm.  PROCEDURE:  Benjamin [name] was brought to room 2 of the [facility] and placed in supine position on the operating table.  His left arm and hand had been marked as the proper surgical site in the holding area with a marking pen per the surgical site identification protocol.  In room 2 under Dr. Jarrett Ables direct supervision, general anesthesia by LMA technique was induced.  The left hand and arm were prepped with Betadine soap and solution, sterilely draped.  A pneumatic tourniquet was applied to the proximal left brachium.  Following exsanguination of left arm with Esmarch bandage, arterial tourniquet was inflated to 220 mmHg.  Following routine surgical time- out, we planned Brunner zigzag incision extending from the DIP flexion crease to the distal palmar crease.  The skin flaps were elevated sharply and extensive disease was noted along the entire  ulnar aspect of the finger.  Extremely meticulous dissection relieved all of the fascial cords to the dermis along the ulnar aspect of the small finger followed by identification of a very large more than 12 mm wide nodule on the ulnar aspect of the small finger proximal phalanx displaced in the neurovascular structures to the midline.  There was extensive involvement of the periosteum of the middle phalanx, and extreme involvement of the artery and nerve at the level of the middle phalanx.  There was a  large central cord distally.  After meticulous excision of all pathologic fascia, we could hyperextend the MP joint 60 degrees and the PIP joint extended to 0 degrees.  We obtained hemostasis with bipolar cautery and saline.  Thereafter, skin flaps were advanced with the V-Y advancement flaps to decrease the palmar dorsal diameter of the finger and to prevent collection of fluid in any dead space.  The wounds were then repaired with simple suture of 5-0 nylon.  They were dressed with Silvadene, Adaptic, sterile gauze, sterile Kerlix, sterile Webril, and a volar plaster splint maintaining the MP joint and PIP joint of a small finger in full extension.  Upon release of the tourniquet, Benjamin [name] had immediate capillary refill to all fingers and the thumb.  There were no apparent complications.  He was awakened from his general anesthesia and transferred to the recovery room with stable signs.    [name], M.D.    RVS/MEDQ  D:  08/08/2013  T:  08/09/2013  Job:  [id number]  cc:   Benjamin [name], M.D.

## 2013-08-10 ENCOUNTER — Encounter (HOSPITAL_BASED_OUTPATIENT_CLINIC_OR_DEPARTMENT_OTHER): Payer: Self-pay | Admitting: Orthopedic Surgery

## 2013-09-18 ENCOUNTER — Ambulatory Visit (HOSPITAL_BASED_OUTPATIENT_CLINIC_OR_DEPARTMENT_OTHER): Payer: BC Managed Care – PPO | Admitting: Hematology & Oncology

## 2013-09-18 ENCOUNTER — Encounter: Payer: Self-pay | Admitting: Hematology & Oncology

## 2013-09-18 ENCOUNTER — Other Ambulatory Visit (HOSPITAL_BASED_OUTPATIENT_CLINIC_OR_DEPARTMENT_OTHER): Payer: BC Managed Care – PPO | Admitting: Lab

## 2013-09-18 VITALS — BP 137/64 | HR 69 | Temp 97.6°F | Resp 18 | Ht 75.0 in | Wt 179.0 lb

## 2013-09-18 DIAGNOSIS — D45 Polycythemia vera: Secondary | ICD-10-CM

## 2013-09-18 LAB — CBC WITH DIFFERENTIAL (CANCER CENTER ONLY)
BASO#: 0.1 10*3/uL (ref 0.0–0.2)
BASO%: 0.7 % (ref 0.0–2.0)
EOS ABS: 0.2 10*3/uL (ref 0.0–0.5)
EOS%: 0.8 % (ref 0.0–7.0)
HCT: 45.2 % (ref 38.7–49.9)
HEMOGLOBIN: 13.2 g/dL (ref 13.0–17.1)
LYMPH#: 1.1 10*3/uL (ref 0.9–3.3)
LYMPH%: 6.1 % — ABNORMAL LOW (ref 14.0–48.0)
MCH: 19.9 pg — AB (ref 28.0–33.4)
MCHC: 29.2 g/dL — AB (ref 32.0–35.9)
MCV: 68 fL — AB (ref 82–98)
MONO#: 0.6 10*3/uL (ref 0.1–0.9)
MONO%: 3.3 % (ref 0.0–13.0)
NEUT#: 15.9 10*3/uL — ABNORMAL HIGH (ref 1.5–6.5)
NEUT%: 89.1 % — ABNORMAL HIGH (ref 40.0–80.0)
Platelets: 286 10*3/uL (ref 145–400)
RBC: 6.63 10*6/uL — AB (ref 4.20–5.70)
RDW: 22.4 % — ABNORMAL HIGH (ref 11.1–15.7)
WBC: 17.8 10*3/uL — ABNORMAL HIGH (ref 4.0–10.0)

## 2013-09-18 LAB — IRON AND TIBC CHCC
%SAT: 8 % — AB (ref 20–55)
IRON: 25 ug/dL — AB (ref 42–163)
TIBC: 328 ug/dL (ref 202–409)
UIBC: 302 ug/dL (ref 117–376)

## 2013-09-18 LAB — CHCC SATELLITE - SMEAR

## 2013-09-18 LAB — FERRITIN CHCC: Ferritin: 10 ng/ml — ABNORMAL LOW (ref 22–316)

## 2013-09-18 NOTE — Progress Notes (Signed)
This office note has been dictated.

## 2013-09-19 NOTE — Progress Notes (Signed)
CC:   Benjamin [name], M.D.  DIAGNOSIS:  Polycythemia vera -- JAK2 positive.  CURRENT THERAPY: 1. Phlebotomy to maintain hematocrit below 45%. 2. Aspirin 81 mg p.o. daily.  INTERIM HISTORY:  Benjamin [name] comes in for followup.  We last saw him back in October.  He has done quite well.  He was phlebotomized back in early November.  We have made him iron deficient.  When we last saw him in October, his ferritin was 11.  He has done well.  He did have surgery on his right 5th digit.  He had a Dupuytren contracture.  This was taken care of.  He is doing much better.  He is now back to playing the guitar.  He has had no problems with cough or shortness of breath.  He has had no headache.  There has been no leg swelling.  He has had no rashes.  There has been no change in bowel or bladder habits.  PHYSICAL EXAMINATION:  General:  This is a well-developed, well- nourished white gentleman in no obvious distress.  Vital Signs: Temperature of 97.6, pulse 69, respiratory rate 18, blood pressure 137/64, weight is 179 pounds.  Head and Neck:  Normocephalic, atraumatic skull.  There are no ocular or oral lesions.  There are no palpable cervical or supraclavicular lymph nodes.  Lungs:  Clear bilaterally. Cardiac:  Regular rate and rhythm with a normal S1, S2.  There are no murmurs, rubs, or bruits.  Abdomen:  Soft.  He has good bowel sounds. There is no fluid wave.  There is no palpable abdominal mass.  There is no palpable hepatosplenomegaly.  Back:  No tenderness over the spine, ribs, or hips.  Extremities:  Show no clubbing, cyanosis, or edema.  He does have the surgical changes on the right fifth digit.  There is a little swelling of the right fifth digit.  LABORATORY STUDIES:  White cell count is 17.8, hemoglobin 13.2, hematocrit 45.2, platelet count 286.  MCV is 68.  IMPRESSION:  Benjamin [name] is a nice 63 year old gentleman with polycythemia vera.  He is JAK2 positive.  We are doing  well to control his hemoglobin with phlebotomies.  I know the FDA just approved Jakafi for polycythemia.  I do not see that we need to go down that road right now.  I think we can probably hold on phlebotomize him right now.  I want to get him back in 2 months, at which point we probably will have to phlebotomize him.    ______________________________ Volanda Napoleon, M.D. PRE/MEDQ  D:  09/18/2013  T:  09/19/2013  Job:  [id number]

## 2013-11-13 ENCOUNTER — Other Ambulatory Visit (HOSPITAL_BASED_OUTPATIENT_CLINIC_OR_DEPARTMENT_OTHER): Payer: BC Managed Care – PPO | Admitting: Lab

## 2013-11-13 ENCOUNTER — Encounter: Payer: Self-pay | Admitting: Hematology & Oncology

## 2013-11-13 ENCOUNTER — Ambulatory Visit (HOSPITAL_BASED_OUTPATIENT_CLINIC_OR_DEPARTMENT_OTHER): Payer: BC Managed Care – PPO | Admitting: Hematology & Oncology

## 2013-11-13 ENCOUNTER — Other Ambulatory Visit: Payer: Self-pay | Admitting: Nurse Practitioner

## 2013-11-13 VITALS — BP 142/56 | HR 75 | Temp 97.6°F | Resp 18 | Ht 74.0 in | Wt 175.0 lb

## 2013-11-13 DIAGNOSIS — D45 Polycythemia vera: Secondary | ICD-10-CM

## 2013-11-13 DIAGNOSIS — N529 Male erectile dysfunction, unspecified: Secondary | ICD-10-CM

## 2013-11-13 LAB — CBC WITH DIFFERENTIAL (CANCER CENTER ONLY)
BASO#: 0.1 10*3/uL (ref 0.0–0.2)
BASO%: 0.6 % (ref 0.0–2.0)
EOS%: 0.7 % (ref 0.0–7.0)
Eosinophils Absolute: 0.1 10*3/uL (ref 0.0–0.5)
HEMATOCRIT: 46.1 % (ref 38.7–49.9)
HGB: 13.8 g/dL (ref 13.0–17.1)
LYMPH#: 1 10*3/uL (ref 0.9–3.3)
LYMPH%: 6.1 % — ABNORMAL LOW (ref 14.0–48.0)
MCH: 20.7 pg — ABNORMAL LOW (ref 28.0–33.4)
MCHC: 29.9 g/dL — AB (ref 32.0–35.9)
MCV: 69 fL — ABNORMAL LOW (ref 82–98)
MONO#: 0.4 10*3/uL (ref 0.1–0.9)
MONO%: 2.5 % (ref 0.0–13.0)
NEUT%: 90.1 % — ABNORMAL HIGH (ref 40.0–80.0)
NEUTROS ABS: 14.2 10*3/uL — AB (ref 1.5–6.5)
Platelets: 256 10*3/uL (ref 145–400)
RBC: 6.67 10*6/uL — AB (ref 4.20–5.70)
RDW: 22.3 % — ABNORMAL HIGH (ref 11.1–15.7)
WBC: 15.7 10*3/uL — ABNORMAL HIGH (ref 4.0–10.0)

## 2013-11-13 LAB — CHCC SATELLITE - SMEAR

## 2013-11-13 MED ORDER — SILDENAFIL CITRATE 50 MG PO TABS: 50.0000 mg | ORAL_TABLET | Freq: Every day | ORAL | Status: DC | PRN

## 2013-11-13 NOTE — Addendum Note (Signed)
Addended by: Burney Gauze R on: 11/13/2013 03:26 PM   Modules accepted: Orders, Medications

## 2013-11-13 NOTE — Progress Notes (Signed)
CC:   Burnard Bunting, M.D.  DIAGNOSIS:  Polycythemia vera -- JAK2 positive.  CURRENT THERAPY: 1. Phlebotomy to maintain hematocrit below 45%. 2. Aspirin 81 mg p.o. daily.  INTERIM HISTORY:  Mr. [name] comes in for followup.  We last saw him back in January.  He has done quite well.  He was phlebotomized back in early November.  We have made him iron deficient.  When we last saw him in January, his ferritin was 10.  He has done well.  He did have surgery on his right 5th digit.  He had a Dupuytren contracture.  This was taken care of.  He is doing much better.  He is now back to playing the guitar.  He has had no problems with cough or shortness of breath.  He has had no headache.  There has been no leg swelling.  He has had no rashes.  There has been no change in bowel or bladder habits.  PHYSICAL EXAMINATION:  General:  This is a well-developed, well- nourished white gentleman in no obvious distress.  Vital Signs: Temperature of 98.2, pulse 74, respiratory rate 18, blood pressure 142/56, weight is 175 pounds.  Head and Neck:  Normocephalic, atraumatic skull.  There are no ocular or oral lesions.  There are no palpable cervical or supraclavicular lymph nodes.  Lungs:  Clear bilaterally. Cardiac:  Regular rate and rhythm with a normal S1, S2.  There are no murmurs, rubs, or bruits.  Abdomen:  Soft.  He has good bowel sounds. There is no fluid wave.  There is no palpable abdominal mass.  There is no palpable hepatosplenomegaly.  Back:  No tenderness over the spine, ribs, or hips.  Extremities:  Show no clubbing, cyanosis, or edema.  He does have the surgical changes on the right fifth digit.  There is a little swelling of the right fifth digit.  LABORATORY STUDIES:  White cell count is 15.7, hemoglobin 13. 8 hematocrit 46.1, platelet count 256.Marland Kitchen  MCV is 69.  IMPRESSION:  Mr. Word is a nice 63 year old gentleman with polycythemia vera.  He is JAK2 positive.  We are doing  well to control his hemoglobin with phlebotomies.  I know the FDA just approved Jakafi for polycythemia.  I do not see that we need to go down that road right now.  I think we can probably phlebotomize him right now. I want to make sure that we be aggressive and keep his hematocrit below 45. I let see that his white cell count is coming down. This often is a better indicator for thrombotic episodes with polycythemia.  I want to get him back in 2 months, at which point we hopefully can follow him and not have  to phlebotomize him.    _

## 2013-11-14 LAB — IRON AND TIBC CHCC
%SAT: 9 % — AB (ref 20–55)
IRON: 28 ug/dL — AB (ref 42–163)
TIBC: 329 ug/dL (ref 202–409)
UIBC: 301 ug/dL (ref 117–376)

## 2013-11-14 LAB — FERRITIN CHCC: Ferritin: 14 ng/ml — ABNORMAL LOW (ref 22–316)

## 2013-11-17 ENCOUNTER — Ambulatory Visit (HOSPITAL_BASED_OUTPATIENT_CLINIC_OR_DEPARTMENT_OTHER): Payer: BC Managed Care – PPO

## 2013-11-17 DIAGNOSIS — D45 Polycythemia vera: Secondary | ICD-10-CM

## 2013-11-17 NOTE — Patient Instructions (Signed)

## 2013-11-17 NOTE — Progress Notes (Signed)
Benjamin [name] presents today for phlebotomy per MD orders. Phlebotomy procedure started at 1415 and ended at 1430. 540mL removed. Patient observed for 30 minutes after procedure without any incident. Patient tolerated procedure well. IV needle removed intact.

## 2014-01-12 ENCOUNTER — Encounter: Payer: Self-pay | Admitting: Hematology & Oncology

## 2014-01-12 ENCOUNTER — Other Ambulatory Visit (HOSPITAL_BASED_OUTPATIENT_CLINIC_OR_DEPARTMENT_OTHER): Payer: BC Managed Care – PPO | Admitting: Lab

## 2014-01-12 ENCOUNTER — Ambulatory Visit (HOSPITAL_BASED_OUTPATIENT_CLINIC_OR_DEPARTMENT_OTHER): Payer: BC Managed Care – PPO | Admitting: Hematology & Oncology

## 2014-01-12 VITALS — BP 123/62 | HR 78 | Temp 97.8°F | Resp 18 | Ht 74.0 in | Wt 179.0 lb

## 2014-01-12 DIAGNOSIS — D45 Polycythemia vera: Secondary | ICD-10-CM

## 2014-01-12 DIAGNOSIS — N529 Male erectile dysfunction, unspecified: Secondary | ICD-10-CM

## 2014-01-12 LAB — CBC WITH DIFFERENTIAL (CANCER CENTER ONLY)
BASO#: 0.1 10*3/uL (ref 0.0–0.2)
BASO%: 0.5 % (ref 0.0–2.0)
EOS%: 1 % (ref 0.0–7.0)
Eosinophils Absolute: 0.2 10*3/uL (ref 0.0–0.5)
HEMATOCRIT: 43.9 % (ref 38.7–49.9)
HGB: 13.2 g/dL (ref 13.0–17.1)
LYMPH#: 1.2 10*3/uL (ref 0.9–3.3)
LYMPH%: 6.8 % — AB (ref 14.0–48.0)
MCH: 20.5 pg — ABNORMAL LOW (ref 28.0–33.4)
MCHC: 30.1 g/dL — AB (ref 32.0–35.9)
MCV: 68 fL — AB (ref 82–98)
MONO#: 0.5 10*3/uL (ref 0.1–0.9)
MONO%: 3.1 % (ref 0.0–13.0)
NEUT#: 14.9 10*3/uL — ABNORMAL HIGH (ref 1.5–6.5)
NEUT%: 88.6 % — ABNORMAL HIGH (ref 40.0–80.0)
PLATELETS: 288 10*3/uL (ref 145–400)
RBC: 6.43 10*6/uL — ABNORMAL HIGH (ref 4.20–5.70)
RDW: 21.4 % — AB (ref 11.1–15.7)
WBC: 16.8 10*3/uL — ABNORMAL HIGH (ref 4.0–10.0)

## 2014-01-12 LAB — FERRITIN CHCC: Ferritin: 12 ng/ml — ABNORMAL LOW (ref 22–316)

## 2014-01-12 LAB — TECHNOLOGIST REVIEW CHCC SATELLITE

## 2014-01-12 LAB — IRON AND TIBC CHCC
%SAT: 7 % — ABNORMAL LOW (ref 20–55)
Iron: 24 ug/dL — ABNORMAL LOW (ref 42–163)
TIBC: 329 ug/dL (ref 202–409)
UIBC: 305 ug/dL (ref 117–376)

## 2014-01-12 LAB — CHCC SATELLITE - SMEAR

## 2014-01-12 MED ORDER — SILDENAFIL CITRATE 25 MG PO TABS: ORAL_TABLET | ORAL | Status: DC

## 2014-01-12 NOTE — Progress Notes (Signed)
Hematology and Oncology Follow Up Visit  Benjamin [name] [id number] 04/30/1951 63 y.o. 01/12/2014   Principle Diagnosis:  Polycythemia vera -- JAK2 positive.  Current Therapy:   Phlebotomy to maintain hematocrit below 45%. 2. Aspirin 81 mg p.o. daily.     Interim History:  [name] is back for followup. He's done very well. So far, his been no palpitations.  He typically is phlebotomized every 2 months or so.  He's had no headache. He's had no cough or shortness of breath. There's been no abdominal pain. He had no nausea vomiting. There's been no change in bowel or bladder habits. He's had no rashes. He's had no fever.  We are making him iron deficient.  Medications: Current outpatient prescriptions:aspirin 325 MG tablet, Take 325 mg by mouth daily.  , Disp: , Rfl: ;  cholecalciferol (VITAMIN D) 1000 UNITS tablet, Take 1,000 Units by mouth daily., Disp: , Rfl: ;  Magnesium 400 MG CAPS, Take by mouth every morning., Disp: , Rfl: ;  METROGEL 1 % gel, 1 application daily. , Disp: , Rfl: ;  minocycline (MINOCIN,DYNACIN) 100 MG capsule, Take by mouth 2 (two) times daily. , Disp: , Rfl:  NON FORMULARY, as needed. Horse Chestnut tab, Disp: , Rfl: ;  Omega-3 Fatty Acids (FISH OIL) 1000 MG CAPS, Take by mouth every morning., Disp: , Rfl: ;  sildenafil (VIAGRA) 25 MG tablet, Take 2-5 tablets as needed for sexual activity, Disp: 50 tablet, Rfl: 0;  Sulfacetamide Sodium 10 % SUSP, Apply topically every morning. , Disp: , Rfl: ;  Vitamin E 400 UNITS TABS, Take by mouth every morning., Disp: , Rfl:   Allergies: No Known Allergies  Past Medical History, Surgical history, Social history, and Family History were reviewed and updated.  Review of Systems: As above  Physical Exam:  height is 6\' 2"  (1.88 m) and weight is 179 lb (81.194 kg). His oral temperature is 97.8 F (36.6 C). His blood pressure is 123/62 and his pulse is 78. His respiration is 18.   Well-developed and well-nourished white  woman. Head and neck exam shows no ocular or oral lesions. He has no palpable cervical or supraclavicular lymph nodes. There is no conjunctival inflammation. Neck is supple with no adenopathy. Lungs are clear. Cardiac exam regular rate and rhythm with no murmurs rubs or bruits. Abdomen is soft. Has good bowel sounds. There is no fluid wave. There is no palpable liver or spleen tip. Back exam no tenderness over the spine ribs or hips. Extremities shows no clubbing cyanosis or edema. Neurological exam shows no focal neurological deficits. Skin exam shows some actinic keratoses.  Lab Results  Component Value Date   WBC 16.8* 01/12/2014   HGB 13.2 01/12/2014   HCT 43.9 01/12/2014   MCV 68* 01/12/2014   PLT 288 01/12/2014     Chemistry   No results found for this basename: NA,  K,  CL,  CO2,  BUN,  CREATININE,  GLU   No results found for this basename: CALCIUM,  ALKPHOS,  AST,  ALT,  BILITOT         Impression and Plan: Benjamin [name] is 63 year old abdomen with polycythemia. He does not need be phlebotomized today. He's done very well. We are watching his white cell count closely. He did have a much more pronounced leukocytosis. It has been much more pronounced previously. It does tend to go up and down.  I will go ahead and plan to have come back now in about  6 weeks. I think this probably would be a good time for him to come back.   Volanda Napoleon, MD 5/22/20152:17 PM

## 2014-02-21 ENCOUNTER — Encounter: Payer: Self-pay | Admitting: Hematology & Oncology

## 2014-02-21 ENCOUNTER — Other Ambulatory Visit (HOSPITAL_BASED_OUTPATIENT_CLINIC_OR_DEPARTMENT_OTHER): Payer: BC Managed Care – PPO | Admitting: Lab

## 2014-02-21 ENCOUNTER — Ambulatory Visit (HOSPITAL_BASED_OUTPATIENT_CLINIC_OR_DEPARTMENT_OTHER): Payer: BC Managed Care – PPO | Admitting: Hematology & Oncology

## 2014-02-21 VITALS — BP 138/52 | HR 78 | Temp 97.6°F | Resp 78 | Ht 74.0 in | Wt 178.0 lb

## 2014-02-21 DIAGNOSIS — D45 Polycythemia vera: Secondary | ICD-10-CM

## 2014-02-21 LAB — CBC WITH DIFFERENTIAL (CANCER CENTER ONLY)
BASO#: 0.1 10*3/uL (ref 0.0–0.2)
BASO%: 0.8 % (ref 0.0–2.0)
EOS%: 1.1 % (ref 0.0–7.0)
Eosinophils Absolute: 0.2 10*3/uL (ref 0.0–0.5)
HEMATOCRIT: 46.5 % (ref 38.7–49.9)
HEMOGLOBIN: 13.9 g/dL (ref 13.0–17.1)
LYMPH#: 1 10*3/uL (ref 0.9–3.3)
LYMPH%: 5.6 % — ABNORMAL LOW (ref 14.0–48.0)
MCH: 20.4 pg — ABNORMAL LOW (ref 28.0–33.4)
MCHC: 29.9 g/dL — ABNORMAL LOW (ref 32.0–35.9)
MCV: 68 fL — ABNORMAL LOW (ref 82–98)
MONO#: 0.6 10*3/uL (ref 0.1–0.9)
MONO%: 3 % (ref 0.0–13.0)
NEUT#: 16.5 10*3/uL — ABNORMAL HIGH (ref 1.5–6.5)
NEUT%: 89.5 % — AB (ref 40.0–80.0)
Platelets: 257 10*3/uL (ref 145–400)
RBC: 6.83 10*6/uL — AB (ref 4.20–5.70)
RDW: 22.1 % — ABNORMAL HIGH (ref 11.1–15.7)
WBC: 18.5 10*3/uL — AB (ref 4.0–10.0)

## 2014-02-21 LAB — TECHNOLOGIST REVIEW CHCC SATELLITE

## 2014-02-21 NOTE — Progress Notes (Signed)
Hematology and Oncology Follow Up Visit  Benjamin [name] [id number] 09-01-1950 63 y.o. 02/21/2014   Principle Diagnosis:  Polycythemia vera -- JAK2 positive.  Current Therapy:   Phlebotomy to maintain hematocrit below 45%. 2. Aspirin 81 mg p.o. daily.     Interim History:  [name] is back for followup. He's done very well. So far, his been no palpitations.  He typically is phlebotomized every 2 months or so.  He's had no headache. He's had no cough or shortness of breath. There's been no abdominal pain. He had no nausea vomiting. There's been no change in bowel or bladder habits. He's had no rashes. He's had no fever.  We are making him iron deficient.  Medications: Current outpatient prescriptions:aspirin 325 MG tablet, Take 325 mg by mouth daily.  , Disp: , Rfl: ;  cholecalciferol (VITAMIN D) 1000 UNITS tablet, Take 1,000 Units by mouth daily., Disp: , Rfl: ;  Magnesium 400 MG CAPS, Take by mouth every morning., Disp: , Rfl: ;  METROGEL 1 % gel, 1 application daily. , Disp: , Rfl: ;  minocycline (MINOCIN,DYNACIN) 100 MG capsule, Take by mouth 2 (two) times daily. , Disp: , Rfl:  NON FORMULARY, as needed. Horse Chestnut tab, Disp: , Rfl: ;  Omega-3 Fatty Acids (FISH OIL) 1000 MG CAPS, Take by mouth every morning., Disp: , Rfl: ;  sildenafil (VIAGRA) 25 MG tablet, Take 2-5 tablets as needed for sexual activity, Disp: 50 tablet, Rfl: 0;  Sulfacetamide Sodium 10 % SUSP, Apply topically every morning. , Disp: , Rfl: ;  Vitamin E 400 UNITS TABS, Take by mouth every morning., Disp: , Rfl:   Allergies: No Known Allergies  Past Medical History, Surgical history, Social history, and Family History were reviewed and updated.  Review of Systems: As above  Physical Exam:  height is 6\' 2"  (1.88 m) and weight is 178 lb (80.74 kg). His oral temperature is 97.6 F (36.4 C). His blood pressure is 138/52 and his pulse is 78. His respiration is 78.   Well-developed and well-nourished white  woman. Head and neck exam shows no ocular or oral lesions. He has no palpable cervical or supraclavicular lymph nodes. There is no conjunctival inflammation. Neck is supple with no adenopathy. Lungs are clear. Cardiac exam regular rate and rhythm with no murmurs rubs or bruits. Abdomen is soft. Has good bowel sounds. There is no fluid wave. There is no palpable liver or spleen tip. Back exam no tenderness over the spine ribs or hips. Extremities shows no clubbing cyanosis or edema. Neurological exam shows no focal neurological deficits. Skin exam shows some actinic keratoses.  Lab Results  Component Value Date   WBC 18.5* 02/21/2014   HGB 13.9 02/21/2014   HCT 46.5 02/21/2014   MCV 68* 02/21/2014   PLT 257 02/21/2014     Chemistry   No results found for this basename: NA,  K,  CL,  CO2,  BUN,  CREATININE,  GLU   No results found for this basename: CALCIUM,  ALKPHOS,  AST,  ALT,  BILITOT         Impression and Plan: Benjamin [name] is 63 year old white gentleman with polycythemia. This is JAK2 positive.  We will go ahead and set him up for a phlebotomy. I we will go ahead and get this done next week. It's about time. His been about 4 months.  His white cell count is upper lobe at. We will continue to watch this. He really has not been  on any therapy for the polycythemia. I still do not think we have to do anything for this.  I will go ahead and plan to have come back now in about 6 weeks. I think this probably would be a good time for him to come back.   Volanda Napoleon, MD 7/1/20154:05 PM

## 2014-02-26 ENCOUNTER — Ambulatory Visit (HOSPITAL_BASED_OUTPATIENT_CLINIC_OR_DEPARTMENT_OTHER): Payer: BC Managed Care – PPO

## 2014-02-26 VITALS — BP 121/74 | HR 73 | Temp 97.0°F | Resp 16

## 2014-02-26 DIAGNOSIS — D45 Polycythemia vera: Secondary | ICD-10-CM

## 2014-02-26 NOTE — Progress Notes (Signed)
Benjamin [name] presents today for phlebotomy per MD orders. Phlebotomy procedure started at 1325 and ended at 1330. Line clotted. 100 mL removed. (Left arm) Phlebotomy procedure started at 1335 and ended at 1340. (Right arm) 472mL removed. Patient observed for 30 minutes after procedure without any incident. Patient tolerated procedure well. IV needle removed intact.

## 2014-02-26 NOTE — Patient Instructions (Signed)
Therapeutic Phlebotomy Care After Refer to this sheet in the next few weeks. These instructions provide you with information on caring for yourself after your procedure. Your caregiver may also give you more specific instructions. Your treatment has been planned according to current medical practices, but problems sometimes occur. Call your caregiver if you have any problems or questions after your procedure. HOME CARE INSTRUCTIONS Most people can go back to their normal activities right away. Before you leave, be sure to ask if there is anything you should or should not do. In general, it would be wise to:  Keep the bandage dry. You can remove the bandage after about 5 hours.  Eat well-balanced meals for the next 24 hours.  Drink enough fluids to keep your urine clear or pale yellow.  Avoid drinking alcohol minimally until after eating.  Avoid smoking for at least 30 minutes after the procedure.  Avoid strenous physical activity or heavy lifting or pulling for about 5 hours after the procedure.  Athletes should avoid strenous exercise for 12 hours or more.  Change positions slowly for the remainder of the day to prevent lightheadedness or fainting.  If you feel lightheaded, lie down until the feeling subsides.  If you have bleeding from the needle insertion site, elevate your arm and press firmly on the site until the bleeding stops.  If bruising or bleeding appears under the skin, apply ice to the area for 15 to 20 minutes, 3 to 4 times per day. Put the ice in a plastic bag and place a towel between the bag of ice and your skin. Do this while you are awake for the first 24 hours. The ice packs can be stopped before 24 hours if the swelling goes away. If swelling persists after 24 hours, a warm, moist washcloth can be applied to the area for 15 to 20 minutes, 3 to 4 times per day. The warm, moist treatments can be stopped when the swelling goes away.  It is important to continue further  therapeutic phlebotomy as directed by your caregiver. SEEK MEDICAL CARE IF:  There is bleeding or fluid leaking from the needle insertion site.  The needle insertion site becomes swollen, red, or sore.  You feel lightheaded, dizzy or nauseated, and the feeling does not go away.  You notice new bruising at the needle insertion site.  You feel more weak or tired than normal.  You develop a fever. SEEK IMMEDIATE MEDICAL CARE IF:   There is increased bleeding, pain, or swelling from the needle insertion site.  You have severe nausea or vomiting.  You have chest pain.  You have trouble breathing. MAKE SURE YOU:  Understand these instructions.  Will watch your condition.  Will get help right away if you are not doing well or get worse. Document Released: 01/12/2011 Document Revised: 11/02/2011 Document Reviewed: 01/12/2011 Exit[employer] Patient Information 2015 [employer]. This information is not intended to replace advice given to you by your health care provider. Make sure you discuss any questions you have with your health care provider.  

## 2014-04-04 ENCOUNTER — Ambulatory Visit (HOSPITAL_BASED_OUTPATIENT_CLINIC_OR_DEPARTMENT_OTHER): Payer: BC Managed Care – PPO | Admitting: Hematology & Oncology

## 2014-04-04 ENCOUNTER — Other Ambulatory Visit (HOSPITAL_BASED_OUTPATIENT_CLINIC_OR_DEPARTMENT_OTHER): Payer: BC Managed Care – PPO | Admitting: Lab

## 2014-04-04 ENCOUNTER — Encounter: Payer: Self-pay | Admitting: Hematology & Oncology

## 2014-04-04 VITALS — BP 128/67 | HR 76 | Temp 98.2°F | Resp 76 | Ht 74.0 in | Wt 178.0 lb

## 2014-04-04 DIAGNOSIS — D45 Polycythemia vera: Secondary | ICD-10-CM

## 2014-04-04 LAB — CBC WITH DIFFERENTIAL (CANCER CENTER ONLY)
BASO#: 0.2 10*3/uL (ref 0.0–0.2)
BASO%: 0.8 % (ref 0.0–2.0)
EOS ABS: 0.2 10*3/uL (ref 0.0–0.5)
EOS%: 0.8 % (ref 0.0–7.0)
HCT: 42.7 % (ref 38.7–49.9)
HGB: 12.9 g/dL — ABNORMAL LOW (ref 13.0–17.1)
LYMPH#: 1.5 10*3/uL (ref 0.9–3.3)
LYMPH%: 7.7 % — ABNORMAL LOW (ref 14.0–48.0)
MCH: 20.7 pg — AB (ref 28.0–33.4)
MCHC: 30.2 g/dL — ABNORMAL LOW (ref 32.0–35.9)
MCV: 69 fL — AB (ref 82–98)
MONO#: 0.6 10*3/uL (ref 0.1–0.9)
MONO%: 3.4 % (ref 0.0–13.0)
NEUT%: 87.3 % — ABNORMAL HIGH (ref 40.0–80.0)
NEUTROS ABS: 16.7 10*3/uL — AB (ref 1.5–6.5)
Platelets: 286 10*3/uL (ref 145–400)
RBC: 6.23 10*6/uL — AB (ref 4.20–5.70)
RDW: 21.6 % — ABNORMAL HIGH (ref 11.1–15.7)
WBC: 19.1 10*3/uL — ABNORMAL HIGH (ref 4.0–10.0)

## 2014-04-04 LAB — COMPREHENSIVE METABOLIC PANEL
ALBUMIN: 4.1 g/dL (ref 3.5–5.2)
ALK PHOS: 691 U/L — AB (ref 39–117)
ALT: 28 U/L (ref 0–53)
AST: 29 U/L (ref 0–37)
BUN: 18 mg/dL (ref 6–23)
CALCIUM: 8.6 mg/dL (ref 8.4–10.5)
CO2: 22 mEq/L (ref 19–32)
Chloride: 103 mEq/L (ref 96–112)
Creatinine, Ser: 1.07 mg/dL (ref 0.50–1.35)
Glucose, Bld: 73 mg/dL (ref 70–99)
POTASSIUM: 4.4 meq/L (ref 3.5–5.3)
Sodium: 136 mEq/L (ref 135–145)
Total Bilirubin: 1 mg/dL (ref 0.2–1.2)
Total Protein: 6 g/dL (ref 6.0–8.3)

## 2014-04-04 LAB — TECHNOLOGIST REVIEW CHCC SATELLITE

## 2014-04-04 LAB — FERRITIN: FERRITIN: 11 ng/mL — AB (ref 22–322)

## 2014-04-04 LAB — IRON AND TIBC
%SAT: 7 % — AB (ref 20–55)
IRON: 26 ug/dL — AB (ref 42–165)
TIBC: 373 ug/dL (ref 215–435)
UIBC: 347 ug/dL (ref 125–400)

## 2014-04-04 LAB — LACTATE DEHYDROGENASE: LDH: 490 U/L — AB (ref 94–250)

## 2014-04-05 NOTE — Progress Notes (Signed)
Hematology and Oncology Follow Up Visit  Benjamin [name] [id number] Jan 03, 1951 63 y.o. 04/05/2014   Principle Diagnosis:  Polycythemia vera -- JAK2 positive.  Current Therapy:   1. Phlebotomy to maintain hematocrit below 45%. 2. Aspirin 81 mg p.o. daily.     Interim History:  Benjamin [name] is back for followup. He is doing well. He's had no complaints. The last phlebotomize him back in July.  He has had no headache. He's had no rashes. He says that he sometimes itches after a shower. There's been no change in bowel or bladder habits. He's had no cough. He's had no fever. There's been no chest pain.  Medications: Current outpatient prescriptions:aspirin 325 MG tablet, Take 325 mg by mouth daily.  , Disp: , Rfl: ;  cholecalciferol (VITAMIN D) 1000 UNITS tablet, Take 1,000 Units by mouth daily., Disp: , Rfl: ;  Magnesium 400 MG CAPS, Take by mouth every morning., Disp: , Rfl: ;  NON FORMULARY, as needed. Horse Chestnut tab, Disp: , Rfl: ;  NON FORMULARY, Apply topically every morning. MOTOQ-- IT IS A CO-Q 10 CREAM THAT HE USES FOR ROSACEA., Disp: , Rfl:  Omega-3 Fatty Acids (FISH OIL) 1000 MG CAPS, Take by mouth every morning., Disp: , Rfl: ;  sildenafil (VIAGRA) 25 MG tablet, Take 2-5 tablets as needed for sexual activity, Disp: 50 tablet, Rfl: 0;  Sulfacetamide Sodium 10 % SUSP, Apply topically every morning. , Disp: , Rfl: ;  Vitamin E 400 UNITS TABS, Take by mouth every morning., Disp: , Rfl:   Allergies: No Known Allergies  Past Medical History, Surgical history, Social history, and Family History were reviewed and updated.  Review of Systems: As above  Physical Exam:  height is 6\' 2"  (1.88 m) and weight is 178 lb (80.74 kg). His oral temperature is 98.2 F (36.8 C). His blood pressure is 128/67 and his pulse is 76. His respiration is 76.   Well-developed and well-nourished white done. Head and neck exam shows no ocular or oral lesions. He has no palpable cervical or  supra-clavicular this. Lungs are clear. Cardiac exam regular in rhythm. Abdomen soft. Has good bowel sounds. There is no fluid wave. There is no palpable liver or spleen tip. Extremities shows no clubbing cyanosis or edema. Has good range motion of his joints. Skin exam no rashes, ecchymosis or petechia. Neurological exam is nonfocal.  Lab Results  Component Value Date   WBC 19.1* 04/04/2014   HGB 12.9* 04/04/2014   HCT 42.7 04/04/2014   MCV 69* 04/04/2014   PLT 286 04/04/2014     Chemistry      Component Value Date/Time   NA 136 04/04/2014 1520   K 4.4 04/04/2014 1520   CL 103 04/04/2014 1520   CO2 22 04/04/2014 1520   BUN 18 04/04/2014 1520   CREATININE 1.07 04/04/2014 1520      Component Value Date/Time   CALCIUM 8.6 04/04/2014 1520   ALKPHOS 691* 04/04/2014 1520   AST 29 04/04/2014 1520   ALT 28 04/04/2014 1520   BILITOT 1.0 04/04/2014 1520         Impression and Plan: Benjamin [name] is 63 year old done with polycythemia vera. He does not need to be phlebotomized this visit. He generally gets phlebotomized every 2 or 3 months.  He clearly is iron deficient.  I'll plan to get him back in about 2 months time.    Volanda Napoleon, MD 8/13/20156:09 AM

## 2014-06-20 ENCOUNTER — Encounter: Payer: Self-pay | Admitting: Hematology & Oncology

## 2014-06-20 ENCOUNTER — Other Ambulatory Visit (HOSPITAL_BASED_OUTPATIENT_CLINIC_OR_DEPARTMENT_OTHER): Payer: PRIVATE HEALTH INSURANCE | Admitting: Lab

## 2014-06-20 ENCOUNTER — Ambulatory Visit (HOSPITAL_BASED_OUTPATIENT_CLINIC_OR_DEPARTMENT_OTHER): Payer: PRIVATE HEALTH INSURANCE | Admitting: Hematology & Oncology

## 2014-06-20 VITALS — BP 132/70 | HR 75 | Temp 97.7°F | Resp 18 | Ht 74.0 in | Wt 177.0 lb

## 2014-06-20 DIAGNOSIS — D45 Polycythemia vera: Secondary | ICD-10-CM

## 2014-06-20 LAB — CBC WITH DIFFERENTIAL (CANCER CENTER ONLY)
BASO#: 0.1 10*3/uL (ref 0.0–0.2)
BASO%: 0.6 % (ref 0.0–2.0)
EOS%: 1 % (ref 0.0–7.0)
Eosinophils Absolute: 0.2 10*3/uL (ref 0.0–0.5)
HCT: 46.2 % (ref 38.7–49.9)
HEMOGLOBIN: 14.1 g/dL (ref 13.0–17.1)
LYMPH#: 1 10*3/uL (ref 0.9–3.3)
LYMPH%: 5.9 % — ABNORMAL LOW (ref 14.0–48.0)
MCH: 21.2 pg — ABNORMAL LOW (ref 28.0–33.4)
MCHC: 30.5 g/dL — ABNORMAL LOW (ref 32.0–35.9)
MCV: 70 fL — ABNORMAL LOW (ref 82–98)
MONO#: 0.6 10*3/uL (ref 0.1–0.9)
MONO%: 3.6 % (ref 0.0–13.0)
NEUT%: 88.9 % — ABNORMAL HIGH (ref 40.0–80.0)
NEUTROS ABS: 14.4 10*3/uL — AB (ref 1.5–6.5)
Platelets: 229 10*3/uL (ref 145–400)
RBC: 6.65 10*6/uL — ABNORMAL HIGH (ref 4.20–5.70)
RDW: 21.8 % — ABNORMAL HIGH (ref 11.1–15.7)
WBC: 16.2 10*3/uL — ABNORMAL HIGH (ref 4.0–10.0)

## 2014-06-20 LAB — COMPREHENSIVE METABOLIC PANEL
ALT: 49 U/L (ref 0–53)
AST: 32 U/L (ref 0–37)
Albumin: 4 g/dL (ref 3.5–5.2)
Alkaline Phosphatase: 750 U/L — ABNORMAL HIGH (ref 39–117)
BILIRUBIN TOTAL: 0.6 mg/dL (ref 0.2–1.2)
BUN: 13 mg/dL (ref 6–23)
CO2: 26 mEq/L (ref 19–32)
CREATININE: 1.2 mg/dL (ref 0.50–1.35)
Calcium: 8.9 mg/dL (ref 8.4–10.5)
Chloride: 104 mEq/L (ref 96–112)
Glucose, Bld: 87 mg/dL (ref 70–99)
Potassium: 4.7 mEq/L (ref 3.5–5.3)
Sodium: 140 mEq/L (ref 135–145)
Total Protein: 6 g/dL (ref 6.0–8.3)

## 2014-06-20 LAB — CHCC SATELLITE - SMEAR

## 2014-06-20 NOTE — Progress Notes (Signed)
Hematology and Oncology Follow Up Visit  Benjamin [name] [id number] [birth year] 63 y.o. 06/20/2014   Principle Diagnosis:  Polycythemia vera -- JAK2 positive.  Current Therapy:   1. Phlebotomy to maintain hematocrit below 45%. 2. Aspirin 81 mg p.o. daily.     Interim History:  Mr.  Benjamin [name] is back for followup. He is doing well. He's had no complaints. We last phlebotomized him back in July.  Back in August, his last ferritin was 11.  He has a party that he is planning that this weekend. 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  He has rosacea. He found that Bactrim has helped most of all. He is on a probiotic. I have no problems with this.  He has had no headache. He's had no rashes. He says that he sometimes itches after a shower. There's been no change in bowel or bladder habits. He's had no cough. He's had no fever. There's been no chest pain.  His appetite has been good. He's had no nausea or vomiting.  Medications: Current outpatient prescriptions:aspirin 325 MG tablet, Take 325 mg by mouth daily.  , Disp: , Rfl: ;  cholecalciferol (VITAMIN D) 1000 UNITS tablet, Take 1,000 Units by mouth daily., Disp: , Rfl: ;  Magnesium 400 MG CAPS, Take by mouth every morning., Disp: , Rfl: ;  NON FORMULARY, as needed. Horse Chestnut tab, Disp: , Rfl: ;  NON FORMULARY, Apply topically every morning. MOTOQ-- IT IS A CO-Q 10 CREAM THAT HE USES FOR ROSACEA., Disp: , Rfl:  Omega-3 Fatty Acids (FISH OIL) 1000 MG CAPS, Take by mouth every morning., Disp: , Rfl: ;  sildenafil (VIAGRA) 25 MG tablet, Take 2-5 tablets as needed for sexual activity, Disp: 50 tablet, Rfl: 0;  sulfamethoxazole-trimethoprim (BACTRIM DS) 800-160 MG per tablet, Take 1 tablet by mouth 2 (two) times daily., Disp: , Rfl: ;  Vitamin E 400 UNITS TABS, Take by mouth every morning., Disp: , Rfl:   Allergies: No Known Allergies  Past Medical History, Surgical history, Social history, and Family  History were reviewed and updated.  Review of Systems: As above  Physical Exam:  height is 6\' 2"  (1.88 m) and weight is 177 lb (80.287 kg). His oral temperature is 97.7 F (36.5 C). His blood pressure is 132/70 and his pulse is 75. His respiration is 18.   Well-developed and well-nourished white done. Head and neck exam shows no ocular or oral lesions. He has no palpable cervical or supra-clavicular this. Lungs are clear. Cardiac exam regular in rhythm. Abdomen soft. Has good bowel sounds. There is no fluid wave. There is no palpable liver or spleen tip. Extremities shows no clubbing cyanosis or edema. Has good range motion of his joints. Skin exam no rashes, ecchymosis or petechia. Neurological exam is nonfocal.  Lab Results  Component Value Date   WBC 16.2* 06/20/2014   HGB 14.1 06/20/2014   HCT 46.2 06/20/2014   MCV 70* 06/20/2014   PLT 229 06/20/2014     Chemistry      Component Value Date/Time   NA 136 04/04/2014 1520   K 4.4 04/04/2014 1520   CL 103 04/04/2014 1520   CO2 22 04/04/2014 1520   BUN 18 04/04/2014 1520   CREATININE 1.07 04/04/2014 1520      Component Value Date/Time   CALCIUM 8.6 04/04/2014 1520   ALKPHOS 691* 04/04/2014 1520   AST 29 04/04/2014 1520   ALT 28 04/04/2014 1520  BILITOT 1.0 04/04/2014 1520         Impression and Plan: [name] is 63 year old done with polycythemia vera. He does  need to be phlebotomized this visit. I will set him up for next week. He is in very well. He generally gets phlebotomized every 2 or 3 months.  He clearly is iron deficient. He is not affected by this.  I'll plan to get him back in about 2 months time.    Volanda Napoleon, MD 10/28/20151:01 PM

## 2014-06-25 ENCOUNTER — Telehealth: Payer: Self-pay | Admitting: Hematology & Oncology

## 2014-06-25 ENCOUNTER — Ambulatory Visit (HOSPITAL_BASED_OUTPATIENT_CLINIC_OR_DEPARTMENT_OTHER): Payer: PRIVATE HEALTH INSURANCE

## 2014-06-25 DIAGNOSIS — D45 Polycythemia vera: Secondary | ICD-10-CM

## 2014-06-25 NOTE — Telephone Encounter (Signed)
Pt called to change appt time from 12p to 3ish for phlebotomy.

## 2014-06-25 NOTE — Progress Notes (Signed)
Benjamin [name] presents today for phlebotomy per MD orders. Phlebotomy procedure started at 1540 and ended at 1545. 548mL removed. Patient observed for 30 minutes after procedure without any incident. Patient tolerated procedure well. IV needle removed intact.

## 2014-06-25 NOTE — Patient Instructions (Signed)

## 2014-08-03 ENCOUNTER — Encounter: Payer: Self-pay | Admitting: Internal Medicine

## 2014-08-20 ENCOUNTER — Encounter: Payer: Self-pay | Admitting: Hematology & Oncology

## 2014-08-20 ENCOUNTER — Ambulatory Visit (HOSPITAL_BASED_OUTPATIENT_CLINIC_OR_DEPARTMENT_OTHER): Payer: PRIVATE HEALTH INSURANCE | Admitting: Hematology & Oncology

## 2014-08-20 ENCOUNTER — Other Ambulatory Visit (HOSPITAL_BASED_OUTPATIENT_CLINIC_OR_DEPARTMENT_OTHER): Payer: PRIVATE HEALTH INSURANCE | Admitting: Lab

## 2014-08-20 VITALS — BP 127/57 | HR 79 | Temp 98.0°F | Resp 18 | Ht 72.0 in | Wt 175.0 lb

## 2014-08-20 DIAGNOSIS — R945 Abnormal results of liver function studies: Secondary | ICD-10-CM

## 2014-08-20 DIAGNOSIS — D45 Polycythemia vera: Secondary | ICD-10-CM

## 2014-08-20 DIAGNOSIS — R7989 Other specified abnormal findings of blood chemistry: Secondary | ICD-10-CM

## 2014-08-20 DIAGNOSIS — E038 Other specified hypothyroidism: Secondary | ICD-10-CM

## 2014-08-20 LAB — FERRITIN CHCC: Ferritin: 15 ng/ml — ABNORMAL LOW (ref 22–316)

## 2014-08-20 LAB — CMP (CANCER CENTER ONLY)
ALK PHOS: 501 U/L — AB (ref 26–84)
ALT: 20 U/L (ref 10–47)
AST: 25 U/L (ref 11–38)
Albumin: 3.3 g/dL (ref 3.3–5.5)
BILIRUBIN TOTAL: 0.7 mg/dL (ref 0.20–1.60)
BUN, Bld: 13 mg/dL (ref 7–22)
CHLORIDE: 103 meq/L (ref 98–108)
CO2: 28 mEq/L (ref 18–33)
CREATININE: 1.3 mg/dL — AB (ref 0.6–1.2)
Calcium: 9 mg/dL (ref 8.0–10.3)
GLUCOSE: 188 mg/dL — AB (ref 73–118)
Potassium: 4 mEq/L (ref 3.3–4.7)
Sodium: 142 mEq/L (ref 128–145)
TOTAL PROTEIN: 6 g/dL — AB (ref 6.4–8.1)

## 2014-08-20 LAB — CBC WITH DIFFERENTIAL (CANCER CENTER ONLY)
BASO#: 0.1 10*3/uL (ref 0.0–0.2)
BASO%: 0.7 % (ref 0.0–2.0)
EOS%: 1.3 % (ref 0.0–7.0)
Eosinophils Absolute: 0.2 10*3/uL (ref 0.0–0.5)
HCT: 42.2 % (ref 38.7–49.9)
HGB: 12.4 g/dL — ABNORMAL LOW (ref 13.0–17.1)
LYMPH#: 1 10*3/uL (ref 0.9–3.3)
LYMPH%: 7.9 % — ABNORMAL LOW (ref 14.0–48.0)
MCH: 21.3 pg — ABNORMAL LOW (ref 28.0–33.4)
MCHC: 29.4 g/dL — ABNORMAL LOW (ref 32.0–35.9)
MCV: 72 fL — AB (ref 82–98)
MONO#: 0.4 10*3/uL (ref 0.1–0.9)
MONO%: 2.9 % (ref 0.0–13.0)
NEUT#: 10.6 10*3/uL — ABNORMAL HIGH (ref 1.5–6.5)
NEUT%: 87.2 % — ABNORMAL HIGH (ref 40.0–80.0)
Platelets: 241 10*3/uL (ref 145–400)
RBC: 5.83 10*6/uL — ABNORMAL HIGH (ref 4.20–5.70)
RDW: 20.3 % — AB (ref 11.1–15.7)
WBC: 12.2 10*3/uL — AB (ref 4.0–10.0)

## 2014-08-20 LAB — IRON AND TIBC CHCC
%SAT: 6 % — AB (ref 20–55)
Iron: 20 ug/dL — ABNORMAL LOW (ref 42–163)
TIBC: 331 ug/dL (ref 202–409)
UIBC: 311 ug/dL (ref 117–376)

## 2014-08-20 LAB — TECHNOLOGIST REVIEW CHCC SATELLITE

## 2014-08-20 NOTE — Addendum Note (Signed)
Addended by: Burney Gauze R on: 08/20/2014 11:11 AM   Modules accepted: Orders

## 2014-08-20 NOTE — Progress Notes (Signed)
Hematology and Oncology Follow Up Visit  Benjamin [name] [id number] October 01, 1950 63 y.o. 06/20/2014   Principle Diagnosis:  Polycythemia vera -- JAK2 positive.  Current Therapy:   1. Phlebotomy to maintain hematocrit below 45%. 2. Aspirin 81 mg p.o. daily.     Interim History:  Benjamin [name] is back for followup. He is doing well. He's had no complaints. We last phlebotomized him back in November.  Back in October his last ferritin was 11.  He had a good Thanksgiving and Christmas. 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  He has rosacea. He found that Bactrim has helped most of all. He is on a probiotic. I have no problems with this.  He had labwork done by his family doctor. His alkaline phosphatase has been elevated. This probably is from the polycythemia. However, I think an ultrasound of the abdomen would not be about idea to rule out steatohepatitis.   His appetite has been good. He's had no nausea or vomiting.  Medications: Current outpatient prescriptions:aspirin 325 MG tablet, Take 325 mg by mouth daily.  , Disp: , Rfl: ;  cholecalciferol (VITAMIN D) 1000 UNITS tablet, Take 1,000 Units by mouth daily., Disp: , Rfl: ;  Magnesium 400 MG CAPS, Take by mouth every morning., Disp: , Rfl: ;  NON FORMULARY, as needed. Horse Chestnut tab, Disp: , Rfl: ;  NON FORMULARY, Apply topically every morning. MOTOQ-- IT IS A CO-Q 10 CREAM THAT HE USES FOR ROSACEA., Disp: , Rfl:  Omega-3 Fatty Acids (FISH OIL) 1000 MG CAPS, Take by mouth every morning., Disp: , Rfl: ;  sildenafil (VIAGRA) 25 MG tablet, Take 2-5 tablets as needed for sexual activity, Disp: 50 tablet, Rfl: 0;  sulfamethoxazole-trimethoprim (BACTRIM DS) 800-160 MG per tablet, Take 1 tablet by mouth 2 (two) times daily., Disp: , Rfl: ;  Vitamin E 400 UNITS TABS, Take by mouth every morning., Disp: , Rfl:   Allergies: No Known Allergies  Past Medical History, Surgical history, Social history, and Family History were reviewed and  updated.  Review of Systems: As above  Physical Exam:  height is 6\' 2"  (1.88 m) and weight is 177 lb (80.287 kg). His oral temperature is 97.7 F (36.5 C). His blood pressure is 132/70 and his pulse is 75. His respiration is 18.   Well-developed and well-nourished white done. Head and neck exam shows no ocular or oral lesions. He has no palpable cervical or supra-clavicular this. Lungs are clear. Cardiac exam regular rate and rhythm. There are no murmurs, rubs or bruits Abdomen soft. Has good bowel sounds. There is no fluid wave. There is no palpable liver or spleen tip. Extremities shows no clubbing cyanosis or edema. Has good range motion of his joints. Skin exam no rashes, ecchymosis or petechia. Neurological exam is nonfocal.  Lab Results  Component Value Date   WBC 16.2* 06/20/2014   HGB 14.1 06/20/2014   HCT 46.2 06/20/2014   MCV 70* 06/20/2014   PLT 229 06/20/2014     Chemistry      Component Value Date/Time   NA 136 04/04/2014 1520   K 4.4 04/04/2014 1520   CL 103 04/04/2014 1520   CO2 22 04/04/2014 1520   BUN 18 04/04/2014 1520   CREATININE 1.07 04/04/2014 1520      Component Value Date/Time   CALCIUM 8.6 04/04/2014 1520   ALKPHOS 691* 04/04/2014 1520   AST 29 04/04/2014 1520   ALT 28 04/04/2014 1520   BILITOT 1.0 04/04/2014 1520  Impression and Plan: Benjamin [name] is 63 year old white male with polycythemia vera. He does not need to be phlebotomized this visit.   I will set him up for an ultrasound this week.   He has done very well this year.Marland Kitchen He generally gets phlebotomized every 2 or 3 months.  He clearly is iron deficient. He is not affected by this.  I'll plan to get him back in about 2 months time.    Volanda Napoleon, MD 10/28/20151:01 PM

## 2014-08-22 ENCOUNTER — Ambulatory Visit (HOSPITAL_COMMUNITY)
Admission: RE | Admit: 2014-08-22 | Discharge: 2014-08-22 | Disposition: A | Discharge status: Home / Self Care | Payer: PRIVATE HEALTH INSURANCE | Source: Ambulatory Visit | Attending: Hematology & Oncology | Admitting: Hematology & Oncology

## 2014-08-22 DIAGNOSIS — R161 Splenomegaly, not elsewhere classified: Secondary | ICD-10-CM | POA: Diagnosis not present

## 2014-08-22 DIAGNOSIS — R7989 Other specified abnormal findings of blood chemistry: Secondary | ICD-10-CM | POA: Insufficient documentation

## 2014-08-22 DIAGNOSIS — D45 Polycythemia vera: Secondary | ICD-10-CM | POA: Insufficient documentation

## 2014-08-22 DIAGNOSIS — R945 Abnormal results of liver function studies: Secondary | ICD-10-CM

## 2014-08-23 ENCOUNTER — Telehealth: Payer: Self-pay | Admitting: Nurse Practitioner

## 2014-08-23 NOTE — Telephone Encounter (Addendum)
-----   Message from Volanda Napoleon, MD sent at 08/22/2014  5:17 PM EST ----- Please call and let him know that the liver looks fine. Spleen is a little bit enlarged on but this is consistent with his polycythemia. Pete  LVM on pt's personal machine. Instructed him to contact us with any further questions or concerns.

## 2014-09-28 ENCOUNTER — Ambulatory Visit (AMBULATORY_SURGERY_CENTER): Payer: Self-pay | Admitting: *Deleted

## 2014-09-28 VITALS — Ht 72.0 in | Wt 175.0 lb

## 2014-09-28 DIAGNOSIS — Z86010 Personal history of colonic polyps: Secondary | ICD-10-CM

## 2014-09-28 MED ORDER — MOVIPREP 100 G PO SOLR: 1.0000 | Freq: Once | ORAL | Status: DC

## 2014-09-28 NOTE — Progress Notes (Signed)
No egg or soy allergy. No anesthesia problems.  No home O2.  No diet meds.  Pt refused emmi.

## 2014-10-12 ENCOUNTER — Encounter: Payer: Self-pay | Admitting: Internal Medicine

## 2014-10-12 ENCOUNTER — Ambulatory Visit (AMBULATORY_SURGERY_CENTER): Payer: Medicare Other | Admitting: Internal Medicine

## 2014-10-12 VITALS — BP 117/67 | HR 60 | Temp 97.0°F | Resp 26 | Ht 72.0 in | Wt 175.0 lb

## 2014-10-12 DIAGNOSIS — D123 Benign neoplasm of transverse colon: Secondary | ICD-10-CM

## 2014-10-12 DIAGNOSIS — D120 Benign neoplasm of cecum: Secondary | ICD-10-CM

## 2014-10-12 DIAGNOSIS — D124 Benign neoplasm of descending colon: Secondary | ICD-10-CM

## 2014-10-12 DIAGNOSIS — Z86010 Personal history of colonic polyps: Secondary | ICD-10-CM

## 2014-10-12 DIAGNOSIS — D122 Benign neoplasm of ascending colon: Secondary | ICD-10-CM

## 2014-10-12 DIAGNOSIS — D126 Benign neoplasm of colon, unspecified: Secondary | ICD-10-CM

## 2014-10-12 MED ORDER — SODIUM CHLORIDE 0.9 % IV SOLN: 500.0000 mL | INTRAVENOUS | Status: DC

## 2014-10-12 NOTE — Progress Notes (Signed)
Report to PACU, RN, vss, BBS= Clear.  

## 2014-10-12 NOTE — Progress Notes (Signed)
Called to room to assist during endoscopic procedure.  Patient ID and intended procedure confirmed with present staff. Received instructions for my participation in the procedure from the performing physician.  

## 2014-10-12 NOTE — Patient Instructions (Signed)
Discharge instructions given. Handouts on polyps. Resume previous medications. YOU HAD AN ENDOSCOPIC PROCEDURE TODAY AT [facility]: Refer to the procedure report that was given to you for any specific questions about what was found during the examination.  If the procedure report does not answer your questions, please call your gastroenterologist to clarify.  If you requested that your care partner not be given the details of your procedure findings, then the procedure report has been included in a sealed envelope for you to review at your convenience later.  YOU SHOULD EXPECT: Some feelings of bloating in the abdomen. Passage of more gas than usual.  Walking can help get rid of the air that was put into your GI tract during the procedure and reduce the bloating. If you had a lower endoscopy (such as a colonoscopy or flexible sigmoidoscopy) you may notice spotting of blood in your stool or on the toilet paper. If you underwent a bowel prep for your procedure, then you may not have a normal bowel movement for a few days.  DIET: Your first meal following the procedure should be a light meal and then it is ok to progress to your normal diet.  A half-sandwich or bowl of soup is an example of a good first meal.  Heavy or fried foods are harder to digest and may make you feel nauseous or bloated.  Likewise meals heavy in dairy and vegetables can cause extra gas to form and this can also increase the bloating.  Drink plenty of fluids but you should avoid alcoholic beverages for 24 hours.  ACTIVITY: Your care partner should take you home directly after the procedure.  You should plan to take it easy, moving slowly for the rest of the day.  You can resume normal activity the day after the procedure however you should NOT DRIVE or use heavy machinery for 24 hours (because of the sedation medicines used during the test).    SYMPTOMS TO REPORT IMMEDIATELY: A gastroenterologist can be reached at any  hour.  During normal business hours, 8:30 AM to 5:00 PM Monday through Friday, call [phone].  After hours and on weekends, please call the GI answering service at [phone] who will take a message and have the physician on call contact you.   Following lower endoscopy (colonoscopy or flexible sigmoidoscopy):  Excessive amounts of blood in the stool  Significant tenderness or worsening of abdominal pains  Swelling of the abdomen that is new, acute  Fever of 100F or higher  FOLLOW UP: If any biopsies were taken you will be contacted by phone or by letter within the next 1-3 weeks.  Call your gastroenterologist if you have not heard about the biopsies in 3 weeks.  Our staff will call the home number listed on your records the next business day following your procedure to check on you and address any questions or concerns that you may have at that time regarding the information given to you following your procedure. This is a courtesy call and so if there is no answer at the home number and we have not heard from you through the emergency physician on call, we will assume that you have returned to your regular daily activities without incident.  SIGNATURES/CONFIDENTIALITY: You and/or your care partner have signed paperwork which will be entered into your electronic medical record.  These signatures attest to the fact that that the information above on your After Visit Summary has been reviewed and is  understood.  Full responsibility of the confidentiality of this discharge information lies with you and/or your care-partner. 

## 2014-10-12 NOTE — Op Note (Signed)
[facility]  [address] [place], [id number]   COLONOSCOPY PROCEDURE REPORT  PATIENT: Benjamin [name], Benjamin [name]  MR#: [id number] BIRTHDATE: [birth year] , 63  yrs. old GENDER: male ENDOSCOPIST: Eustace Quail, MD REFERRED CB:[id number] Program Recall PROCEDURE DATE:  10/12/2014 PROCEDURE:   Colonoscopy with snare polypectomy x 12 . Extended time (>30 min) and service (multiple polyps) First Screening Colonoscopy - Avg.  risk and is 50 yrs.  old or older - No.  Prior Negative Screening - Now for repeat screening. N/A  History of Adenoma - Now for follow-up colonoscopy & has been > or = to 3 yrs.  Yes hx of adenoma.  Has been 3 or more years since last colonoscopy.  Polyps Removed Today? Yes. ASA CLASS:   Class II INDICATIONS:follow up of adenomatous colonic polyp(s).  Index exam 06-2005 w/ TAs - overdue for follow up MEDICATIONS: Monitored anesthesia care and Propofol 400 mg IV  DESCRIPTION OF PROCEDURE:   After the risks benefits and alternatives of the procedure were thoroughly explained, informed consent was obtained.  The digital rectal exam revealed no abnormalities of the rectum.   The LB [id number]  endoscope was introduced through the anus and advanced to the cecum, which was identified by both the appendix and ileocecal valve. No adverse events experienced.   The quality of the prep was excellent, using MoviPrep  The instrument was then slowly withdrawn as the colon was fully examined.  COLON FINDINGS: Twelve polyps ranging from 4 to 30mm in size were found in the transverse (1), descending (4),  cecum (3), and  the ascending colon (4).  A polypectomy was performed with a cold snare.  The resection was complete, the polyp tissue was completely retrieved and sent to histology.   The examination was otherwise normal.  Retroflexed views revealed internal hemorrhoids. The time to cecum=4 min 33 sec.  Withdrawal time=29 min 01 sec.  The scope was withdrawn  and the procedure completed. COMPLICATIONS: There were no immediate complications.  ENDOSCOPIC IMPRESSION: 1.   Twelve polyps were found in the colon; polypectomy was performed with a cold snare 2.   The examination was otherwise normal  RECOMMENDATIONS: 1. Repeat Colonoscopy in 1 year.  eSigned:  Eustace Quail, MD 10/12/2014 10:08 AM   cc: Burnard Bunting, MD and The Patient   PATIENT NAME:  Benjamin [name], Benjamin [name] MR#: [id number]

## 2014-10-15 ENCOUNTER — Telehealth: Payer: Self-pay | Admitting: *Deleted

## 2014-10-15 NOTE — Telephone Encounter (Signed)
  Follow up Call-  Call back number 10/12/2014  Post procedure Call Back phone  # [phone]  Permission to leave phone message Yes     No answer, left message on machine.

## 2014-10-23 ENCOUNTER — Encounter: Payer: Self-pay | Admitting: Internal Medicine

## 2014-10-24 ENCOUNTER — Other Ambulatory Visit (HOSPITAL_BASED_OUTPATIENT_CLINIC_OR_DEPARTMENT_OTHER): Payer: Medicare Other | Admitting: Lab

## 2014-10-24 ENCOUNTER — Ambulatory Visit (HOSPITAL_BASED_OUTPATIENT_CLINIC_OR_DEPARTMENT_OTHER): Payer: Medicare Other | Admitting: Hematology & Oncology

## 2014-10-24 ENCOUNTER — Ambulatory Visit (HOSPITAL_BASED_OUTPATIENT_CLINIC_OR_DEPARTMENT_OTHER): Payer: Medicare Other

## 2014-10-24 ENCOUNTER — Encounter: Payer: Self-pay | Admitting: Hematology & Oncology

## 2014-10-24 VITALS — BP 136/62 | HR 79 | Temp 97.6°F | Resp 18 | Ht 72.0 in | Wt 171.0 lb

## 2014-10-24 DIAGNOSIS — D45 Polycythemia vera: Secondary | ICD-10-CM

## 2014-10-24 DIAGNOSIS — R7989 Other specified abnormal findings of blood chemistry: Secondary | ICD-10-CM

## 2014-10-24 DIAGNOSIS — E038 Other specified hypothyroidism: Secondary | ICD-10-CM

## 2014-10-24 DIAGNOSIS — R945 Abnormal results of liver function studies: Secondary | ICD-10-CM

## 2014-10-24 LAB — CMP (CANCER CENTER ONLY)
ALK PHOS: 734 U/L — AB (ref 26–84)
ALT(SGPT): 21 U/L (ref 10–47)
AST: 26 U/L (ref 11–38)
Albumin: 3.7 g/dL (ref 3.3–5.5)
BILIRUBIN TOTAL: 0.9 mg/dL (ref 0.20–1.60)
BUN, Bld: 17 mg/dL (ref 7–22)
CO2: 30 mEq/L (ref 18–33)
Calcium: 9.3 mg/dL (ref 8.0–10.3)
Chloride: 102 mEq/L (ref 98–108)
Creat: 1.7 mg/dl — ABNORMAL HIGH (ref 0.6–1.2)
GLUCOSE: 102 mg/dL (ref 73–118)
POTASSIUM: 4.5 meq/L (ref 3.3–4.7)
Sodium: 142 mEq/L (ref 128–145)
TOTAL PROTEIN: 6.6 g/dL (ref 6.4–8.1)

## 2014-10-24 LAB — CBC WITH DIFFERENTIAL (CANCER CENTER ONLY)
BASO#: 0.1 10*3/uL (ref 0.0–0.2)
BASO%: 0.6 % (ref 0.0–2.0)
EOS ABS: 0.1 10*3/uL (ref 0.0–0.5)
EOS%: 0.8 % (ref 0.0–7.0)
HCT: 47.5 % (ref 38.7–49.9)
HGB: 14.2 g/dL (ref 13.0–17.1)
LYMPH#: 1 10*3/uL (ref 0.9–3.3)
LYMPH%: 5.8 % — AB (ref 14.0–48.0)
MCH: 20.7 pg — AB (ref 28.0–33.4)
MCHC: 29.9 g/dL — ABNORMAL LOW (ref 32.0–35.9)
MCV: 69 fL — AB (ref 82–98)
MONO#: 0.6 10*3/uL (ref 0.1–0.9)
MONO%: 3.3 % (ref 0.0–13.0)
NEUT#: 15.1 10*3/uL — ABNORMAL HIGH (ref 1.5–6.5)
NEUT%: 89.5 % — ABNORMAL HIGH (ref 40.0–80.0)
Platelets: 272 10*3/uL (ref 145–400)
RBC: 6.85 10*6/uL — ABNORMAL HIGH (ref 4.20–5.70)
RDW: 22 % — ABNORMAL HIGH (ref 11.1–15.7)
WBC: 16.9 10*3/uL — ABNORMAL HIGH (ref 4.0–10.0)

## 2014-10-24 LAB — TSH: TSH: 3.792 u[IU]/mL (ref 0.350–4.500)

## 2014-10-24 NOTE — Progress Notes (Signed)
Hematology and Oncology Follow Up Visit  Benjamin [name] [id number] 1950/11/29 64 y.o. 06/20/2014   Principle Diagnosis:  Polycythemia vera -- JAK2 positive.  Current Therapy:   1. Phlebotomy to maintain hematocrit below 45%. 2. Aspirin 81 mg p.o. daily.     Interim History:  Benjamin [name] is back for followup. He is doing well. He's had no complaints. We last phlebotomized him back in November.  We did go ahead and get an abdominal ultrasound on him back in December.. This did show that he had some splenomegaly. His spleen was 18.7 cm. I really cannot feel his spleen on exam. His liver looked okay.  He got through the holidays okay. He is again ready for the springtime. 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  He's had no abdominal pain. He's had no leg swelling. He's had no rashes. There's been no change in bowel or bladder habits.  He's had no cough. There's been no shortness of breath. He's had no fever.  He did have a pneumonia vaccine.  His appetite has been good. He's had no nausea or vomiting.  Medications: Current outpatient prescriptions:aspirin 325 MG tablet, Take 325 mg by mouth daily.  , Disp: , Rfl: ;  cholecalciferol (VITAMIN D) 1000 UNITS tablet, Take 1,000 Units by mouth daily., Disp: , Rfl: ;  Magnesium 400 MG CAPS, Take by mouth every morning., Disp: , Rfl: ;  NON FORMULARY, as needed. Horse Chestnut tab, Disp: , Rfl: ;  NON FORMULARY, Apply topically every morning. MOTOQ-- IT IS A CO-Q 10 CREAM THAT HE USES FOR ROSACEA., Disp: , Rfl:  Omega-3 Fatty Acids (FISH OIL) 1000 MG CAPS, Take by mouth every morning., Disp: , Rfl: ;  sildenafil (VIAGRA) 25 MG tablet, Take 2-5 tablets as needed for sexual activity, Disp: 50 tablet, Rfl: 0;  sulfamethoxazole-trimethoprim (BACTRIM DS) 800-160 MG per tablet, Take 1 tablet by mouth 2 (two) times daily., Disp: , Rfl: ;  Vitamin E 400 UNITS TABS, Take by mouth every morning., Disp: , Rfl:    Allergies: No Known Allergies  Past Medical History, Surgical history, Social history, and Family History were reviewed and updated.  Review of Systems: As above  Physical Exam:  height is 6\' 2"  (1.88 m) and weight is 177 lb (80.287 kg). His oral temperature is 97.7 F (36.5 C). His blood pressure is 132/70 and his pulse is 75. His respiration is 18.   Well-developed and well-nourished white done. Head and neck exam shows no ocular or oral lesions. He has no palpable cervical or supra-clavicular this. Lungs are clear. Cardiac exam regular rate and rhythm. There are no murmurs, rubs or bruits Abdomen is soft. He has good bowel sounds. There is no fluid wave. There is no palpable liver or spleen tip. Extremities shows no clubbing cyanosis or edema. Has good range motion of his joints. Skin exam no rashes, ecchymosis or petechia. Neurological exam is nonfocal.  Lab Results  Component Value Date   WBC 16.2* 06/20/2014   HGB 14.1 06/20/2014   HCT 46.2 06/20/2014   MCV 70* 06/20/2014   PLT 229 06/20/2014     Chemistry      Component Value Date/Time   NA 136 04/04/2014 1520   K 4.4 04/04/2014 1520   CL 103 04/04/2014 1520   CO2 22 04/04/2014 1520   BUN 18 04/04/2014 1520   CREATININE 1.07 04/04/2014 1520      Component Value Date/Time  CALCIUM 8.6 04/04/2014 1520   ALKPHOS 691* 04/04/2014 1520   AST 29 04/04/2014 1520   ALT 28 04/04/2014 1520   BILITOT 1.0 04/04/2014 1520         Impression and Plan: Benjamin [name] is 64 year old white male with polycythemia vera. He needs to be phlebotomized this visit.   I will set him up for an ultrasound this week.   Again, we will phlebotomize him today.  He typically gets phlebotomized every 2 or 3 months.  I will plan to see him back in May.    Volanda Napoleon, MD 10/28/20151:01 PM

## 2014-10-24 NOTE — Progress Notes (Signed)
Benjamin [name] presents today for phlebotomy per MD orders. Phlebotomy procedure started at 1510 and ended at 1520. 500 grams removed. Patient observed for 30 minutes after procedure without any incident. Patient tolerated procedure well. IV needle removed intact.

## 2014-10-24 NOTE — Patient Instructions (Signed)

## 2014-10-25 ENCOUNTER — Telehealth: Payer: Self-pay | Admitting: *Deleted

## 2014-10-25 NOTE — Telephone Encounter (Signed)
-----   Message from Volanda Napoleon, MD sent at 10/25/2014  7:43 AM EST ----- Call - thyroid level is ok!!  pete

## 2014-12-14 ENCOUNTER — Ambulatory Visit (INDEPENDENT_AMBULATORY_CARE_PROVIDER_SITE_OTHER): Payer: Medicare Other | Admitting: Family Medicine

## 2014-12-14 ENCOUNTER — Ambulatory Visit (HOSPITAL_BASED_OUTPATIENT_CLINIC_OR_DEPARTMENT_OTHER)
Admission: RE | Admit: 2014-12-14 | Discharge: 2014-12-14 | Disposition: A | Discharge status: Home / Self Care | Payer: Medicare Other | Source: Ambulatory Visit | Attending: Family Medicine | Admitting: Family Medicine

## 2014-12-14 VITALS — BP 128/72 | HR 77 | Temp 97.4°F | Resp 20 | Ht 73.0 in | Wt 164.4 lb

## 2014-12-14 DIAGNOSIS — R1011 Right upper quadrant pain: Secondary | ICD-10-CM

## 2014-12-14 DIAGNOSIS — R161 Splenomegaly, not elsewhere classified: Secondary | ICD-10-CM | POA: Diagnosis not present

## 2014-12-14 DIAGNOSIS — D72829 Elevated white blood cell count, unspecified: Secondary | ICD-10-CM | POA: Insufficient documentation

## 2014-12-14 DIAGNOSIS — R112 Nausea with vomiting, unspecified: Secondary | ICD-10-CM | POA: Diagnosis not present

## 2014-12-14 LAB — POCT CBC
Granulocyte percent: 92.7 % — AB (ref 37–80)
HCT, POC: 47.7 % (ref 43.5–53.7)
Hemoglobin: 14.1 g/dL (ref 14.1–18.1)
Lymph, poc: 1.7 (ref 0.6–3.4)
MCH, POC: 20.1 pg — AB (ref 27–31.2)
MCHC: 29.6 g/dL — AB (ref 31.8–35.4)
MCV: 67.8 fL — AB (ref 80–97)
MID (cbc): 1.1 — AB (ref 0–0.9)
MPV: 8.7 fL (ref 0–99.8)
POC Granulocyte: 34.8 — AB (ref 2–6.9)
POC LYMPH PERCENT: 4.4 % — AB (ref 10–50)
POC MID %: 2.9 % (ref 0–12)
Platelet Count, POC: 372 K/uL (ref 142–424)
RBC: 7.03 M/uL — AB (ref 4.69–6.13)
RDW, POC: 21.2 %
WBC: 37.5 K/uL — AB (ref 4.6–10.2)

## 2014-12-14 LAB — COMPREHENSIVE METABOLIC PANEL WITH GFR
ALT: 26 U/L (ref 0–53)
AST: 35 U/L (ref 0–37)
Albumin: 4 g/dL (ref 3.5–5.2)
Alkaline Phosphatase: 623 U/L — ABNORMAL HIGH (ref 39–117)
BUN: 26 mg/dL — ABNORMAL HIGH (ref 6–23)
CO2: 26 meq/L (ref 19–32)
Calcium: 8.7 mg/dL (ref 8.4–10.5)
Chloride: 93 meq/L — ABNORMAL LOW (ref 96–112)
Creat: 1.13 mg/dL (ref 0.50–1.35)
Glucose, Bld: 108 mg/dL — ABNORMAL HIGH (ref 70–99)
Potassium: 5.1 meq/L (ref 3.5–5.3)
Sodium: 130 meq/L — ABNORMAL LOW (ref 135–145)
Total Bilirubin: 1.5 mg/dL — ABNORMAL HIGH (ref 0.2–1.2)
Total Protein: 6 g/dL (ref 6.0–8.3)

## 2014-12-14 MED ORDER — ONDANSETRON 4 MG PO TBDP
8.0000 mg | ORAL_TABLET | Freq: Once | ORAL | Status: AC
  Administered 2014-12-14: 8 mg via ORAL

## 2014-12-14 MED ORDER — ONDANSETRON HCL 8 MG PO TABS: 8.0000 mg | ORAL_TABLET | Freq: Three times a day (TID) | ORAL | Status: DC | PRN

## 2014-12-14 NOTE — Patient Instructions (Addendum)
Please go and get your ultrasound now at the [facility].  Register as an outpatient at the ER.  I will give you a call with the results- stay at the hospital until we talk just in case Assuming your gallbladder looks ok you can go home, use zofran as needed for nausea.    [facility] ?   Address: [address], [place], [place] [address]  [phone]

## 2014-12-14 NOTE — Progress Notes (Addendum)
[facility] [address] [place] [address] [phone]  Date:  12/14/2014   Name:  Benjamin [name]   DOB:  11-09-1950   MRN:  [id number]  PCP:  Geoffery Lyons, MD    Chief Complaint: Emesis; Diarrhea; Rectal Bleeding; Nausea; and Abdominal Pain   History of Present Illness:  Benjamin [name] is a 64 y.o. very pleasant male patient who presents with the following:  Here today as a new pt.  History of polycythemia vera followed by Dr. Marin Olp.  He thinks that he may have had food poisioning.  He made dinner with a group of friends on Wednesday of this week and several people got sick.   Today is Friday- his sx started late Wednesday/early Thursday with frequent vomiting. He also had diarrhea.  The vomiting was pretty constant for about 4 hours, and a couple of times yesterday.  None today.  Diarrhea is now resolved as well.  He did have a stool this am.  No blood in his vomit.  He did have a little red blood in his stool yesterday but thinks it was likely from frequent stools/ trauma  He has felt nauseated since, and has not felt much like eating.  He does not think that he had a fever at all. He is able to drink water, and did have some coffee today. He tried to eat an apple yesterday and vomitited, did keep down a small amount of food today.   He also notes a RUQ pain that was present yesterday, worse today.  This is what brought him in.  He takes aspirin daily for his p vera.   He gives blood a couple of times a year to keep his rbc levels under control  His P vera was discovered due to DVT many years ago  OW he is generally in good health  Very recent normal colonosocpy.    He has high alk phos at baseline No history of abdominal operations  He did have an orange at 11 am. He has had some water during the day- just drank a little water right now.    Patient Active Problem List   Diagnosis Date Noted  . P. vera 08/19/2011    Past Medical  History  Diagnosis Date  . P. vera 08/19/2011  . Polycythemia   . Anemia   . DVT (deep venous thrombosis)     hx -lt leg-dx polycythemia  . Focal dystonia   . Clotting disorder     Past Surgical History  Procedure Laterality Date  . Wisdom tooth extraction    . Finger arthroplasty  1998    rt index  . Inguinal hernia repair  2007    left  . Inguinal hernia repair  2009    right  . Colonoscopy    . Dupuytren contracture release Left 08/08/2013    Procedure: DUPUYTREN CONTRACTURE RELEASE LEFT HAND;  Surgeon: Cammie Sickle., MD;  Location: [facility];  Service: Orthopedics;  Laterality: Left;  . Colonoscopy    . Hernia repair      History  Substance Use Topics  . Smoking status: Never Smoker   . Smokeless tobacco: Never Used     Comment: never used tobacco  . Alcohol Use: 0.0 oz/week    0 Standard drinks or equivalent per week     Comment: almost daily wine    Family History  Problem Relation Age of Onset  . Colon  cancer Neg Hx   . Heart disease Father   . Hyperlipidemia Father   . Hypertension Father   . Diabetes Father   . Diabetes Brother   . Hyperlipidemia Brother     Allergies  Allergen Reactions  . Keflex [Cephalexin] Hives    Medication list has been reviewed and updated.  Current Outpatient Prescriptions on File Prior to Visit  Medication Sig Dispense Refill  . aspirin 325 MG tablet Take 325 mg by mouth daily.      . cholecalciferol (VITAMIN D) 1000 UNITS tablet Take 1,000 Units by mouth daily.    . Magnesium 400 MG CAPS Take by mouth every morning.    . NON FORMULARY as needed. Horse Chestnut tab    . sulfamethoxazole-trimethoprim (BACTRIM DS) 800-160 MG per tablet Take 1 tablet by mouth 2 (two) times daily.    . NON FORMULARY Apply topically every morning. MITOQ-- IT IS A CO-Q 10 CREAM THAT HE USES FOR ROSACEA.    . Omega-3 Fatty Acids (FISH OIL) 1000 MG CAPS Take by mouth every morning.    . sildenafil (VIAGRA) 25 MG tablet  Take 2-5 tablets as needed for sexual activity (Patient not taking: Reported on 12/14/2014) 50 tablet 0  . Vitamin E 400 UNITS TABS Take by mouth every morning.     No current facility-administered medications on file prior to visit.    Review of Systems:  As per HPI- otherwise negative.   Physical Examination: Filed Vitals:   12/14/14 1531  BP: 128/72  Pulse: 77  Temp: 97.4 F (36.3 C)  Resp: 20   Filed Vitals:   12/14/14 1531  Height: _0  (1.854 m)  Weight: 164 lb 6.4 oz (74.571 kg)   Body mass index is 21.69 kg/(m^2). Ideal Body Weight: Weight in (lb) to have BMI = 25: 189.1  GEN: WDWN, NAD, Non-toxic, A & O x 3, looks well HEENT: Atraumatic, Normocephalic. Neck supple. No masses, No LAD. Ears and Nose: No external deformity. CV: RRR, No M/G/R. No JVD. No thrill. No extra heart sounds. PULM: CTA B, no wheezes, crackles, rhonchi. No retractions. No resp. distress. No accessory muscle use. ABD: S, ND, +BS. No rebound. No HSM. He notes minimal, non-specific RUQ tenderness.  Neg murphy's sign.  He does have some tenderness in his right upper posterior ribs.  No rash to suggest chingles EXTR: No c/c/e NEURO Normal gait.  PSYCH: Normally interactive. Conversant. Not depressed or anxious appearing.  Calm demeanor.   Given zofran 53m po once; this did help.  Also seemed to improve his abd pain  Results for orders placed or performed in visit on 12/14/14  Comprehensive metabolic panel  Result Value Ref Range   Sodium 130 (L) 135 - 145 mEq/L   Potassium 5.1 3.5 - 5.3 mEq/L   Chloride 93 (L) 96 - 112 mEq/L   CO2 26 19 - 32 mEq/L   Glucose, Bld 108 (H) 70 - 99 mg/dL   BUN 26 (H) 6 - 23 mg/dL   Creat 1.13 0.50 - 1.35 mg/dL   Total Bilirubin 1.5 (H) 0.2 - 1.2 mg/dL   Alkaline Phosphatase 623 (H) 39 - 117 U/L   AST 35 0 - 37 U/L   ALT 26 0 - 53 U/L   Total Protein 6.0 6.0 - 8.3 g/dL   Albumin 4.0 3.5 - 5.2 g/dL   Calcium 8.7 8.4 - 10.5 mg/dL  POCT CBC  Result Value Ref  Range   WBC 37.5 (A) 4.6 -  10.2 K/uL   Lymph, poc 1.7 0.6 - 3.4   POC LYMPH PERCENT 4.4 (A) 10 - 50 %L   MID (cbc) 1.1 (A) 0 - 0.9   POC MID % 2.9 0 - 12 %M   POC Granulocyte 34.8 (A) 2 - 6.9   Granulocyte percent 92.7 (A) 37 - 80 %G   RBC 7.03 (A) 4.69 - 6.13 M/uL   Hemoglobin 14.1 14.1 - 18.1 g/dL   HCT, POC 47.7 43.5 - 53.7 %   MCV 67.8 (A) 80 - 97 fL   MCH, POC 20.1 (A) 27 - 31.2 pg   MCHC 29.6 (A) 31.8 - 35.4 g/dL   RDW, POC 21.2 %   Platelet Count, POC 372 142 - 424 K/uL   MPV 8.7 0 - 99.8 fL    Received CBC as above.  Called and discussed with Dr. Marin Olp who was on call for hematology.  With this pt's condition, the leukocytosis above may be all reactive.  It does not represent a hematologic emergency Assessment and Plan: Nausea and vomiting, vomiting of unspecified type - Plan: POCT CBC, Comprehensive metabolic panel, ondansetron (ZOFRAN-ODT) disintegrating tablet 8 mg, ondansetron (ZOFRAN) 8 MG tablet  RUQ pain - Plan: POCT CBC, Comprehensive metabolic panel, US Abdomen Complete, CANCELED: US Abdomen Limited RUQ  Discussed with pt in detail.  Certainly his sx may all be due to either viral or food borne illness, and his trunk pain may be related to muscular strain from vomiting.  His WBC count is not normal at baseline, but it is significantly higher now.  Given his RUQ pain would like to do an [place] to r/o cholecystitis.  He is ok with this plan, send to [facility] for [place].  Received result as below.    Signed Lamar Blinks, MD  Received his [place] report:  ULTRASOUND ABDOMEN COMPLETE  COMPARISON: Ultrasound 08/22/2014  FINDINGS: Gallbladder: Negative for gallstones. Gallbladder wall diffusely thickened measuring 6 mm. Negative sonographic Murphy sign. Small amount of pericholecystic fluid. Common bile duct: Diameter: 4.2 mm  Liver: No focal lesion identified. Within normal limits in parenchymal echogenicity. IVC: No abnormality visualized.  Pancreas: Not well  visualized Spleen: Splenomegaly. Splenic length 15.1 cm. Splenic volume 1,431 31 cc  Right Kidney: Length: 10.4 cm. Echogenicity within normal limits. No mass or hydronephrosis visualized. Left Kidney: Length: 9.4 cm. 13 mm left renal cyst. Echogenicity within normal limits. No mass or hydronephrosis visualized. Abdominal aorta: No aneurysm visualized. Other findings: None.  IMPRESSION: Gallbladder wall thickening without gallstones. This could represent acalculous cholecystitis however the patient is not tender over the gallbladder. Gallbladder edema without infection could also have this appearance. Gallbladder wall was normal on the prior ultrasound of 08/22/2014.  Splenomegaly  Discussed with general surgeon on call.  At this time he does not definitely need cholecystectomy unless his LFTs are significantly high.  I called [facility] to have his CMP changed to stat.  Called pt but had to [facility] (cell).  Will contact him pending LFTs   Na 130, cl 93, BUN 26, bili 1.5, creat 1.13- received stat labs.  AST and ALT ok,  His alk phos is high but this is baseline.   Will call pt in the am to check on him  Called pt a few times 4/23- LMOM that his LFTs look ok, as long as he is feeling better we can follow-up his gallbladder sub acutely.  He did call me back and LM that he was indeed feeling better.  Will send letter to him with further details

## 2014-12-16 ENCOUNTER — Encounter: Payer: Self-pay | Admitting: Family Medicine

## 2014-12-19 ENCOUNTER — Ambulatory Visit: Payer: Medicare Other

## 2014-12-19 ENCOUNTER — Ambulatory Visit (HOSPITAL_BASED_OUTPATIENT_CLINIC_OR_DEPARTMENT_OTHER): Payer: Medicare Other | Admitting: Hematology & Oncology

## 2014-12-19 ENCOUNTER — Other Ambulatory Visit (HOSPITAL_BASED_OUTPATIENT_CLINIC_OR_DEPARTMENT_OTHER): Payer: Medicare Other

## 2014-12-19 ENCOUNTER — Encounter: Payer: Self-pay | Admitting: Hematology & Oncology

## 2014-12-19 VITALS — BP 119/55 | HR 74 | Temp 97.6°F | Resp 18 | Ht 71.0 in | Wt 161.0 lb

## 2014-12-19 DIAGNOSIS — D45 Polycythemia vera: Secondary | ICD-10-CM | POA: Diagnosis not present

## 2014-12-19 LAB — CBC WITH DIFFERENTIAL (CANCER CENTER ONLY)
BASO#: 0.1 10*3/uL (ref 0.0–0.2)
BASO%: 0.6 % (ref 0.0–2.0)
EOS%: 1.1 % (ref 0.0–7.0)
Eosinophils Absolute: 0.2 10*3/uL (ref 0.0–0.5)
HCT: 42.3 % (ref 38.7–49.9)
HGB: 12.6 g/dL — ABNORMAL LOW (ref 13.0–17.1)
LYMPH#: 1.3 10*3/uL (ref 0.9–3.3)
LYMPH%: 6 % — ABNORMAL LOW (ref 14.0–48.0)
MCH: 21 pg — AB (ref 28.0–33.4)
MCHC: 29.8 g/dL — ABNORMAL LOW (ref 32.0–35.9)
MCV: 71 fL — AB (ref 82–98)
MONO#: 0.6 10*3/uL (ref 0.1–0.9)
MONO%: 2.6 % (ref 0.0–13.0)
NEUT%: 89.7 % — ABNORMAL HIGH (ref 40.0–80.0)
NEUTROS ABS: 19.2 10*3/uL — AB (ref 1.5–6.5)
RBC: 6 10*6/uL — ABNORMAL HIGH (ref 4.20–5.70)
RDW: 22.4 % — ABNORMAL HIGH (ref 11.1–15.7)
WBC: 21.4 10*3/uL — AB (ref 4.0–10.0)

## 2014-12-19 LAB — CMP (CANCER CENTER ONLY)
ALT(SGPT): 24 U/L (ref 10–47)
AST: 27 U/L (ref 11–38)
Albumin: 3.3 g/dL (ref 3.3–5.5)
Alkaline Phosphatase: 664 U/L — ABNORMAL HIGH (ref 26–84)
BUN: 14 mg/dL (ref 7–22)
CHLORIDE: 100 meq/L (ref 98–108)
CO2: 30 mEq/L (ref 18–33)
CREATININE: 1.2 mg/dL (ref 0.6–1.2)
Calcium: 8.7 mg/dL (ref 8.0–10.3)
Glucose, Bld: 102 mg/dL (ref 73–118)
Potassium: 4.5 mEq/L (ref 3.3–4.7)
Sodium: 139 mEq/L (ref 128–145)
Total Bilirubin: 0.9 mg/dl (ref 0.20–1.60)
Total Protein: 5.9 g/dL — ABNORMAL LOW (ref 6.4–8.1)

## 2014-12-19 LAB — CHCC SATELLITE - SMEAR

## 2014-12-19 NOTE — Progress Notes (Signed)
Hematology and Oncology Follow Up Visit  Benjamin [name] [id number] [birth year] 64 y.o. 06/20/2014   Principle Diagnosis:  Polycythemia vera -- JAK2 positive.  Current Therapy:   1. Phlebotomy to maintain hematocrit below 45%. 2. Aspirin 81 mg p.o. daily.     Interim History:  Benjamin [name] is back for followup. He is recovering from food poisoning. He got this about a week ago. He thinks it may be some that he opened up that was canned. He said that there was some pear preserves that were canned and possible this may have been the source. He was sick for about a day or so.  His doctor call me say that his white cell count was quite high. I reassured her that this was just a natural reaction given is hyperactive bone marrow from the polycythemia.  Otherwise, he seems to be doing okay. He has some events that he will be playing at coming up soon.  He's had no bleeding. He's had no rashes.  He had the diarrhea with the food poisoning. This is better.  Overall, his performance status is ECOG 1.  Medications: Current outpatient prescriptions:aspirin 325 MG tablet, Take 325 mg by mouth daily.  , Disp: , Rfl: ;  cholecalciferol (VITAMIN D) 1000 UNITS tablet, Take 1,000 Units by mouth daily., Disp: , Rfl: ;  Magnesium 400 MG CAPS, Take by mouth every morning., Disp: , Rfl: ;  NON FORMULARY, as needed. Horse Chestnut tab, Disp: , Rfl: ;  NON FORMULARY, Apply topically every morning. MOTOQ-- IT IS A CO-Q 10 CREAM THAT HE USES FOR ROSACEA., Disp: , Rfl:  Omega-3 Fatty Acids (FISH OIL) 1000 MG CAPS, Take by mouth every morning., Disp: , Rfl: ;  sildenafil (VIAGRA) 25 MG tablet, Take 2-5 tablets as needed for sexual activity, Disp: 50 tablet, Rfl: 0;  sulfamethoxazole-trimethoprim (BACTRIM DS) 800-160 MG per tablet, Take 1 tablet by mouth 2 (two) times daily., Disp: , Rfl: ;  Vitamin E 400 UNITS TABS, Take by mouth every morning., Disp: , Rfl:   Allergies: No Known Allergies  Past Medical History,  Surgical history, Social history, and Family History were reviewed and updated.  Review of Systems: As above  Physical Exam:  height is 6\' 2"  (1.88 m) and weight is 177 lb (80.287 kg). His oral temperature is 97.7 F (36.5 C). His blood pressure is 132/70 and his pulse is 75. His respiration is 18.   Well-developed and well-nourished white done. Head and neck exam shows no ocular or oral lesions. He has no palpable cervical or supra-clavicular this. Lungs are clear. Cardiac exam regular rate and rhythm. There are no murmurs, rubs or bruits Abdomen is soft. He has good bowel sounds. There is no fluid wave. There is no palpable liver or spleen tip. Extremities shows no clubbing cyanosis or edema. Has good range motion of his joints. Skin exam no rashes, ecchymosis or petechia. Neurological exam is nonfocal.  Lab Results  Component Value Date   WBC 16.2* 06/20/2014   HGB 14.1 06/20/2014   HCT 46.2 06/20/2014   MCV 70* 06/20/2014   PLT 229 06/20/2014     Chemistry      Component Value Date/Time   NA 136 04/04/2014 1520   K 4.4 04/04/2014 1520   CL 103 04/04/2014 1520   CO2 22 04/04/2014 1520   BUN 18 04/04/2014 1520   CREATININE 1.07 04/04/2014 1520      Component Value Date/Time   CALCIUM 8.6 04/04/2014 1520  ALKPHOS 691* 04/04/2014 1520   AST 29 04/04/2014 1520   ALT 28 04/04/2014 1520   BILITOT 1.0 04/04/2014 1520         Impression and Plan: Benjamin [name] is 64 year old white male with polycythemia vera. His blood counts look okay. He does not need to be phlebotomized. His white cell count has come back to his baseline level.  I think we by let him go 3 months now. I think we get them back after July 4 weekend. I just don't think that his hemoglobin will go up that quickly.Volanda Napoleon, MD 10/28/20151:01 PM

## 2014-12-20 LAB — FERRITIN CHCC: Ferritin: 51 ng/ml (ref 22–316)

## 2014-12-20 LAB — IRON AND TIBC CHCC
%SAT: 5 % — AB (ref 20–55)
Iron: 18 ug/dL — ABNORMAL LOW (ref 42–163)
TIBC: 339 ug/dL (ref 202–409)
UIBC: 321 ug/dL (ref 117–376)

## 2014-12-28 ENCOUNTER — Other Ambulatory Visit: Payer: Self-pay

## 2015-02-27 ENCOUNTER — Ambulatory Visit: Payer: Medicare Other

## 2015-02-27 ENCOUNTER — Other Ambulatory Visit (HOSPITAL_BASED_OUTPATIENT_CLINIC_OR_DEPARTMENT_OTHER): Payer: Medicare Other

## 2015-02-27 ENCOUNTER — Encounter: Payer: Self-pay | Admitting: Hematology & Oncology

## 2015-02-27 ENCOUNTER — Ambulatory Visit (HOSPITAL_BASED_OUTPATIENT_CLINIC_OR_DEPARTMENT_OTHER): Payer: Medicare Other | Admitting: Hematology & Oncology

## 2015-02-27 VITALS — BP 122/65 | HR 74 | Temp 97.4°F | Resp 18 | Ht 70.0 in | Wt 167.0 lb

## 2015-02-27 DIAGNOSIS — D45 Polycythemia vera: Secondary | ICD-10-CM | POA: Diagnosis not present

## 2015-02-27 LAB — CBC WITH DIFFERENTIAL (CANCER CENTER ONLY)
BASO#: 0.1 10*3/uL (ref 0.0–0.2)
BASO%: 0.7 % (ref 0.0–2.0)
EOS%: 0.8 % (ref 0.0–7.0)
Eosinophils Absolute: 0.2 10*3/uL (ref 0.0–0.5)
HCT: 43.7 % (ref 38.7–49.9)
HGB: 13.1 g/dL (ref 13.0–17.1)
LYMPH#: 1.3 10*3/uL (ref 0.9–3.3)
LYMPH%: 7.3 % — ABNORMAL LOW (ref 14.0–48.0)
MCH: 20.8 pg — AB (ref 28.0–33.4)
MCHC: 30 g/dL — AB (ref 32.0–35.9)
MCV: 69 fL — ABNORMAL LOW (ref 82–98)
MONO#: 0.6 10*3/uL (ref 0.1–0.9)
MONO%: 3.3 % (ref 0.0–13.0)
NEUT%: 87.9 % — ABNORMAL HIGH (ref 40.0–80.0)
NEUTROS ABS: 15.5 10*3/uL — AB (ref 1.5–6.5)
Platelets: 275 10*3/uL (ref 145–400)
RBC: 6.31 10*6/uL — AB (ref 4.20–5.70)
RDW: 21.6 % — ABNORMAL HIGH (ref 11.1–15.7)
WBC: 17.7 10*3/uL — AB (ref 4.0–10.0)

## 2015-02-27 LAB — RETICULOCYTES (CHCC)
ABS Retic: 173.6 10*3/uL (ref 19.0–186.0)
RBC.: 6.43 MIL/uL — ABNORMAL HIGH (ref 4.22–5.81)
RETIC CT PCT: 2.7 % — AB (ref 0.4–2.3)

## 2015-02-27 LAB — CHCC SATELLITE - SMEAR

## 2015-02-27 LAB — LACTATE DEHYDROGENASE: LDH: 521 U/L — ABNORMAL HIGH (ref 94–250)

## 2015-02-27 NOTE — Progress Notes (Signed)
No phlebotomy today per dr. ennever 

## 2015-02-27 NOTE — Progress Notes (Signed)
Hematology and Oncology Follow Up Visit  Benjamin [name] [id number] [birth year] 64 y.o. 06/20/2014   Principle Diagnosis:  Polycythemia vera -- JAK2 positive.  Current Therapy:   1. Phlebotomy to maintain hematocrit below 45%. 2. Aspirin 81 mg p.o. daily.     Interim History:  Benjamin [name] is back for followup. He is looking good. He has had no proms over the summer so far. He had a good July 4 weekend.  He's had no headache. There's been no pain in his hands or feet. He's had no nausea or vomiting. He had a bout of food poisoning back in the spring time.  He has had no rashes. He does have rosacea but this does not appear to be all that bad right now. Per graph he's had no cough or shortness of breath. He's had no fever. He's had no mouth sores.. He's had no dysphagia.   Overall, his performance status is ECOG 1.  Medications: Current outpatient prescriptions:aspirin 325 MG tablet, Take 325 mg by mouth daily.  , Disp: , Rfl: ;  cholecalciferol (VITAMIN D) 1000 UNITS tablet, Take 1,000 Units by mouth daily., Disp: , Rfl: ;  Magnesium 400 MG CAPS, Take by mouth every morning., Disp: , Rfl: ;  NON FORMULARY, as needed. Horse Chestnut tab, Disp: , Rfl: ;  NON FORMULARY, Apply topically every morning. MOTOQ-- IT IS A CO-Q 10 CREAM THAT HE USES FOR ROSACEA., Disp: , Rfl:  Omega-3 Fatty Acids (FISH OIL) 1000 MG CAPS, Take by mouth every morning., Disp: , Rfl: ;  sildenafil (VIAGRA) 25 MG tablet, Take 2-5 tablets as needed for sexual activity, Disp: 50 tablet, Rfl: 0;  sulfamethoxazole-trimethoprim (BACTRIM DS) 800-160 MG per tablet, Take 1 tablet by mouth 2 (two) times daily., Disp: , Rfl: ;  Vitamin E 400 UNITS TABS, Take by mouth every morning., Disp: , Rfl:   Allergies: No Known Allergies  Past Medical History, Surgical history, Social history, and Family History were reviewed and updated.  Review of Systems: As above  Physical Exam:  height is 6\' 2"  (1.88 m) and weight is 177 lb  (80.287 kg). His oral temperature is 97.7 F (36.5 C). His blood pressure is 132/70 and his pulse is 75. His respiration is 18.   Well-developed and well-nourished white male.  Head and neck exam shows no ocular or oral lesions. He has no palpable cervical or supra-clavicular lymph nodes. Lungs are clear. Cardiac exam regular rate and rhythm. There are no murmurs, rubs or bruits.  Abdomen is soft. He has good bowel sounds. There is no fluid wave. There is no palpable liver or spleen tip. Extremities shows no clubbing cyanosis or edema. Has good range motion of his joints. Skin exam no rashes, ecchymosis or petechia. He does have rosacea on his face. Neurological exam is nonfocal.  Lab Results  Component Value Date   WBC 16.2* 06/20/2014   HGB 14.1 06/20/2014   HCT 46.2 06/20/2014   MCV 70* 06/20/2014   PLT 229 06/20/2014     Chemistry      Component Value Date/Time   NA 136 04/04/2014 1520   K 4.4 04/04/2014 1520   CL 103 04/04/2014 1520   CO2 22 04/04/2014 1520   BUN 18 04/04/2014 1520   CREATININE 1.07 04/04/2014 1520      Component Value Date/Time   CALCIUM 8.6 04/04/2014 1520   ALKPHOS 691* 04/04/2014 1520   AST 29 04/04/2014 1520   ALT 28 04/04/2014 1520  BILITOT 1.0 04/04/2014 1520         Impression and Plan: Benjamin [name] is 64 year old white male with polycythemia vera. His blood counts look okay. He does not need to be phlebotomized. His white cell count has come back to his baseline level.  I think we can plan to get him back in 3 more months. I think this is a good duration for follow-up.    Volanda Napoleon, MD 10/28/20151:01 PM

## 2015-02-28 LAB — FERRITIN CHCC: FERRITIN: 16 ng/mL — AB (ref 22–316)

## 2015-02-28 LAB — IRON AND TIBC CHCC
%SAT: 6 % — ABNORMAL LOW (ref 20–55)
Iron: 20 ug/dL — ABNORMAL LOW (ref 42–163)
TIBC: 358 ug/dL (ref 202–409)
UIBC: 338 ug/dL (ref 117–376)

## 2015-05-30 ENCOUNTER — Ambulatory Visit: Payer: Medicare Other | Admitting: Hematology & Oncology

## 2015-05-30 ENCOUNTER — Other Ambulatory Visit: Payer: Medicare Other

## 2015-07-25 ENCOUNTER — Ambulatory Visit (HOSPITAL_BASED_OUTPATIENT_CLINIC_OR_DEPARTMENT_OTHER): Payer: Medicare Other | Admitting: Hematology & Oncology

## 2015-07-25 ENCOUNTER — Other Ambulatory Visit (HOSPITAL_BASED_OUTPATIENT_CLINIC_OR_DEPARTMENT_OTHER): Payer: Medicare Other

## 2015-07-25 VITALS — BP 117/102 | HR 78 | Temp 97.7°F | Resp 18

## 2015-07-25 DIAGNOSIS — D45 Polycythemia vera: Secondary | ICD-10-CM | POA: Diagnosis not present

## 2015-07-25 LAB — CBC WITH DIFFERENTIAL (CANCER CENTER ONLY)
BASO#: 0.1 10*3/uL (ref 0.0–0.2)
BASO%: 0.7 % (ref 0.0–2.0)
EOS%: 1.2 % (ref 0.0–7.0)
Eosinophils Absolute: 0.2 10*3/uL (ref 0.0–0.5)
HEMATOCRIT: 46.8 % (ref 38.7–49.9)
HGB: 13.9 g/dL (ref 13.0–17.1)
LYMPH#: 1.3 10*3/uL (ref 0.9–3.3)
LYMPH%: 6.9 % — ABNORMAL LOW (ref 14.0–48.0)
MCH: 20.7 pg — ABNORMAL LOW (ref 28.0–33.4)
MCHC: 29.7 g/dL — ABNORMAL LOW (ref 32.0–35.9)
MCV: 70 fL — ABNORMAL LOW (ref 82–98)
MONO#: 0.7 10*3/uL (ref 0.1–0.9)
MONO%: 3.5 % (ref 0.0–13.0)
NEUT#: 16.9 10*3/uL — ABNORMAL HIGH (ref 1.5–6.5)
NEUT%: 87.7 % — ABNORMAL HIGH (ref 40.0–80.0)
Platelets: 268 10*3/uL (ref 145–400)
RBC: 6.73 10*6/uL — ABNORMAL HIGH (ref 4.20–5.70)
RDW: 23 % — ABNORMAL HIGH (ref 11.1–15.7)
WBC: 19.3 10*3/uL — ABNORMAL HIGH (ref 4.0–10.0)

## 2015-07-25 LAB — COMPREHENSIVE METABOLIC PANEL
ALT: 57 U/L — AB (ref 9–46)
AST: 39 U/L — ABNORMAL HIGH (ref 10–35)
Albumin: 3.8 g/dL (ref 3.6–5.1)
Alkaline Phosphatase: 767 U/L — ABNORMAL HIGH (ref 40–115)
BUN: 19 mg/dL (ref 7–25)
CO2: 27 mmol/L (ref 20–31)
Calcium: 8.8 mg/dL (ref 8.6–10.3)
Chloride: 104 mmol/L (ref 98–110)
Creatinine, Ser: 1.09 mg/dL (ref 0.70–1.25)
Glucose, Bld: 90 mg/dL (ref 65–99)
Potassium: 4.6 mmol/L (ref 3.5–5.3)
Sodium: 139 mmol/L (ref 135–146)
Total Bilirubin: 0.9 mg/dL (ref 0.2–1.2)
Total Protein: 5.9 g/dL — ABNORMAL LOW (ref 6.1–8.1)

## 2015-07-25 LAB — TECHNOLOGIST REVIEW CHCC SATELLITE

## 2015-07-25 NOTE — Progress Notes (Signed)
Hematology and Oncology Follow Up Visit  Benjamin [name] [id number] 11/20/1950 64 y.o. 06/20/2014   Principle Diagnosis:  Polycythemia vera -- JAK2 positive.  Current Therapy:   1. Phlebotomy to maintain hematocrit below 45%. 2. Aspirin 81 mg p.o. daily.     Interim History:  Mr.  Benjamin [name] is back for followup. He missed his last appointment with his. The last one was back in July.  He's had a good summer. He's had a good fall. 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  Had a good Thanksgiving.  He's been eating well.  He goes to see his family doctor in a couple weeks for his annual checkup.  He's had no headache. He's had no cough. He's had no fever. He's had no rashes. He's had no leg swelling.  Overall, his performance status is ECOG 1.  Medications: Current outpatient prescriptions:aspirin 325 MG tablet, Take 325 mg by mouth daily.  , Disp: , Rfl: ;  cholecalciferol (VITAMIN D) 1000 UNITS tablet, Take 1,000 Units by mouth daily., Disp: , Rfl: ;  Magnesium 400 MG CAPS, Take by mouth every morning., Disp: , Rfl: ;  NON FORMULARY, as needed. Horse Chestnut tab, Disp: , Rfl: ;  NON FORMULARY, Apply topically every morning. MOTOQ-- IT IS A CO-Q 10 CREAM THAT HE USES FOR ROSACEA., Disp: , Rfl:  Omega-3 Fatty Acids (FISH OIL) 1000 MG CAPS, Take by mouth every morning., Disp: , Rfl: ;  sildenafil (VIAGRA) 25 MG tablet, Take 2-5 tablets as needed for sexual activity, Disp: 50 tablet, Rfl: 0;  sulfamethoxazole-trimethoprim (BACTRIM DS) 800-160 MG per tablet, Take 1 tablet by mouth 2 (two) times daily., Disp: , Rfl: ;  Vitamin E 400 UNITS TABS, Take by mouth every morning., Disp: , Rfl:   Allergies: No Known Allergies  Past Medical History, Surgical history, Social history, and Family History were reviewed and updated.  Review of Systems: As above  Physical Exam:  height is 6\' 2"  (1.88 m) and weight is 177 lb (80.287 kg). His oral temperature is 97.7 F (36.5 C).  His blood pressure is 132/70 and his pulse is 75. His respiration is 18.   Well-developed and well-nourished white male.  Head and neck exam shows no ocular or oral lesions. He has no palpable cervical or supra-clavicular lymph nodes. Lungs are clear. Cardiac exam regular rate and rhythm. There are no murmurs, rubs or bruits.  Abdomen is soft. He has good bowel sounds. There is no fluid wave. There is no palpable liver or spleen tip. Extremities shows no clubbing cyanosis or edema. Has good range motion of his joints. Skin exam no rashes, ecchymosis or petechia. He does have rosacea on his face. Neurological exam is nonfocal.  Lab Results  Component Value Date   WBC 16.2* 06/20/2014   HGB 14.1 06/20/2014   HCT 46.2 06/20/2014   MCV 70* 06/20/2014   PLT 229 06/20/2014     Chemistry      Component Value Date/Time   NA 136 04/04/2014 1520   K 4.4 04/04/2014 1520   CL 103 04/04/2014 1520   CO2 22 04/04/2014 1520   BUN 18 04/04/2014 1520   CREATININE 1.07 04/04/2014 1520      Component Value Date/Time   CALCIUM 8.6 04/04/2014 1520   ALKPHOS 691* 04/04/2014 1520   AST 29 04/04/2014 1520   ALT 28 04/04/2014 1520   BILITOT 1.0 04/04/2014 1520         Impression and Plan:  [name] is 64 year old white male with polycythemia vera. It has been a while since we last saw him. As such, it is no surprise that his blood count is up a little bit. We will go ahead and plan to phlebotomize him. He will be phlebotomized next week.  I did recheck his blood pressure. I got 118/78. I this is much more reflective of what his blood pressure typically is.  We will plan to he him back now in 3 months.   duration for follow-up.    Volanda Napoleon, MD 10/28/20151:01 PM

## 2015-07-26 LAB — IRON AND TIBC CHCC
%SAT: 6 % — AB (ref 20–55)
IRON: 20 ug/dL — AB (ref 42–163)
TIBC: 338 ug/dL (ref 202–409)
UIBC: 318 ug/dL (ref 117–376)

## 2015-07-26 LAB — FERRITIN CHCC: Ferritin: 22 ng/ml (ref 22–316)

## 2015-07-31 ENCOUNTER — Ambulatory Visit (HOSPITAL_BASED_OUTPATIENT_CLINIC_OR_DEPARTMENT_OTHER): Payer: Medicare Other

## 2015-07-31 VITALS — BP 130/54 | HR 93 | Temp 97.5°F | Resp 20

## 2015-07-31 DIAGNOSIS — D45 Polycythemia vera: Secondary | ICD-10-CM

## 2015-07-31 NOTE — Patient Instructions (Signed)
Therapeutic Phlebotomy, Care After  Refer to this sheet in the next few weeks. These instructions provide you with information about caring for yourself after your procedure. Your health care provider may also give you more specific instructions. Your treatment has been planned according to current medical practices, but problems sometimes occur. Call your health care provider if you have any problems or questions after your procedure.  WHAT TO EXPECT AFTER THE PROCEDURE  After your procedure, it is common to have:   Light-headedness or dizziness. You may feel faint.   Nausea.   Tiredness.  HOME CARE INSTRUCTIONS  Activities   Return to your normal activities as directed by your health care provider. Most people can go back to their normal activities right away.   Avoid strenuous physical activity and heavy lifting or pulling for about 5 hours after the procedure. Do not lift anything that is heavier than 10 lb (4.5 kg).   Athletes should avoid strenuous exercise for at least 12 hours.   Change positions slowly for the remainder of the day. This will help to prevent light-headedness or fainting.   If you feel light-headed, lie down until the feeling goes away.  Eating and Drinking   Be sure to eat well-balanced meals for the next 24 hours.   Drink enough fluid to keep your urine clear or pale yellow.   Avoid drinking alcohol on the day that you had the procedure.  Care of the Needle Insertion Site   Keep your bandage dry. You can remove the bandage after about 5 hours or as directed by your health care provider.   If you have bleeding from the needle insertion site, elevate your arm and press firmly on the site until the bleeding stops.   If you have bruising at the site, apply ice to the area:   Put ice in a plastic bag.   Place a towel between your skin and the bag.   Leave the ice on for 20 minutes, 2-3 times a day for the first 24 hours.   If the swelling does not go away after 24 hours, apply  a warm, moist washcloth to the area for 20 minutes, 2-3 times a day.  General Instructions   Avoid smoking for at least 30 minutes after the procedure.   Keep all follow-up visits as directed by your health care provider. It is important to continue with further therapeutic phlebotomy treatments as directed.  SEEK MEDICAL CARE IF:   You have redness, swelling, or pain at the needle insertion site.   You have fluid, blood, or pus coming from the needle insertion site.   You feel light-headed, dizzy, or nauseated, and the feeling does not go away.   You notice new bruising at the needle insertion site.   You feel weaker than normal.   You have a fever or chills.  SEEK IMMEDIATE MEDICAL CARE IF:   You have severe nausea or vomiting.   You have chest pain.   You have trouble breathing.    This information is not intended to replace advice given to you by your health care provider. Make sure you discuss any questions you have with your health care provider.    Document Released: 01/12/2011 Document Revised: 12/25/2014 Document Reviewed: 08/06/2014  Elsevier Interactive Patient Education 2016 [employer]

## 2015-07-31 NOTE — Progress Notes (Signed)
Benjamin [name] presents today for phlebotomy per MD orders. Phlebotomy procedure started at 1525 and ended at 1530. 500 ml removed. Patient observed for 30 minutes after procedure without any incident. Patient tolerated procedure well. IV needle removed intact.

## 2015-10-23 ENCOUNTER — Telehealth: Payer: Self-pay | Admitting: *Deleted

## 2015-10-23 NOTE — Telephone Encounter (Signed)
Returned patient phone message. Left message on answering machine to reschedule appt

## 2015-10-25 ENCOUNTER — Ambulatory Visit: Payer: Medicare Other | Admitting: Hematology & Oncology

## 2015-10-25 ENCOUNTER — Other Ambulatory Visit: Payer: Medicare Other

## 2015-10-28 ENCOUNTER — Ambulatory Visit (HOSPITAL_BASED_OUTPATIENT_CLINIC_OR_DEPARTMENT_OTHER): Payer: Medicare Other | Admitting: Hematology & Oncology

## 2015-10-28 ENCOUNTER — Encounter: Payer: Self-pay | Admitting: Hematology & Oncology

## 2015-10-28 ENCOUNTER — Other Ambulatory Visit (HOSPITAL_BASED_OUTPATIENT_CLINIC_OR_DEPARTMENT_OTHER): Payer: Medicare Other

## 2015-10-28 VITALS — BP 134/60 | HR 68 | Temp 97.7°F | Resp 16 | Ht 70.0 in | Wt 167.0 lb

## 2015-10-28 DIAGNOSIS — D45 Polycythemia vera: Secondary | ICD-10-CM

## 2015-10-28 LAB — CBC WITH DIFFERENTIAL (CANCER CENTER ONLY)
BASO#: 0.1 10*3/uL (ref 0.0–0.2)
BASO%: 0.7 % (ref 0.0–2.0)
EOS ABS: 0.2 10*3/uL (ref 0.0–0.5)
EOS%: 0.9 % (ref 0.0–7.0)
HCT: 44.8 % (ref 38.7–49.9)
HGB: 13.4 g/dL (ref 13.0–17.1)
LYMPH#: 1.3 10*3/uL (ref 0.9–3.3)
LYMPH%: 6.8 % — AB (ref 14.0–48.0)
MCH: 20.4 pg — ABNORMAL LOW (ref 28.0–33.4)
MCHC: 29.9 g/dL — AB (ref 32.0–35.9)
MCV: 68 fL — ABNORMAL LOW (ref 82–98)
MONO#: 0.5 10*3/uL (ref 0.1–0.9)
MONO%: 2.7 % (ref 0.0–13.0)
NEUT#: 17 10*3/uL — ABNORMAL HIGH (ref 1.5–6.5)
NEUT%: 88.9 % — ABNORMAL HIGH (ref 40.0–80.0)
RBC: 6.58 10*6/uL — ABNORMAL HIGH (ref 4.20–5.70)
RDW: 21.9 % — ABNORMAL HIGH (ref 11.1–15.7)
WBC: 19.2 10*3/uL — AB (ref 4.0–10.0)

## 2015-10-28 LAB — TECHNOLOGIST REVIEW CHCC SATELLITE

## 2015-10-28 LAB — CHCC SATELLITE - SMEAR

## 2015-10-28 NOTE — Progress Notes (Signed)
Hematology and Oncology Follow Up Visit  Benjamin [name] [id number] 1951/07/09 65 y.o. 06/20/2014   Principle Diagnosis:  Polycythemia vera -- JAK2 positive.  Current Therapy:   1. Phlebotomy to maintain hematocrit below 45%. 2. Aspirin 81 mg p.o. daily.     Interim History:  [name] is back for followup. He looks great. We last saw him back in December. He was phlebotomized at that time.  He and his wife she's got back from [place]. They were on the [facility]. He had a great time. He wore his sunscreen.  Otherwise, he is doing quite well. He's had no problems with cough. He's had no shortness of breath. He's had no nausea or vomiting. He's had no diarrhea. He's had no joint issues.  We checked his iron studies back in December.  His ferritin was 22 with an iron saturation of 6%.   Overall, his performance status is ECOG 0.  Medications: Current outpatient prescriptions:aspirin 325 MG tablet, Take 325 mg by mouth daily.  , Disp: , Rfl: ;  cholecalciferol (VITAMIN D) 1000 UNITS tablet, Take 1,000 Units by mouth daily., Disp: , Rfl: ;  Magnesium 400 MG CAPS, Take by mouth every morning., Disp: , Rfl: ;  NON FORMULARY, as needed. Horse Chestnut tab, Disp: , Rfl: ;  NON FORMULARY, Apply topically every morning. MOTOQ-- IT IS A CO-Q 10 CREAM THAT HE USES FOR ROSACEA., Disp: , Rfl:  Omega-3 Fatty Acids (FISH OIL) 1000 MG CAPS, Take by mouth every morning., Disp: , Rfl: ;  sildenafil (VIAGRA) 25 MG tablet, Take 2-5 tablets as needed for sexual activity, Disp: 50 tablet, Rfl: 0;  sulfamethoxazole-trimethoprim (BACTRIM DS) 800-160 MG per tablet, Take 1 tablet by mouth 2 (two) times daily., Disp: , Rfl: ;  Vitamin E 400 UNITS TABS, Take by mouth every morning., Disp: , Rfl:   Allergies: No Known Allergies  Past Medical History, Surgical history, Social history, and Family History were reviewed and updated.  Review of Systems: As above  Physical Exam:  height is 6\' 2"  (1.88 m) and  weight is 177 lb (80.287 kg). His oral temperature is 97.7 F (36.5 C). His blood pressure is 132/70 and his pulse is 75. His respiration is 18.   Well-developed and well-nourished white male.  Head and neck exam shows no ocular or oral lesions. He has no palpable cervical or supra-clavicular lymph nodes. Lungs are clear. Cardiac exam regular rate and rhythm. There are no murmurs, rubs or bruits.  Abdomen is soft. He has good bowel sounds. There is no fluid wave. There is no palpable liver or spleen tip. Extremities shows no clubbing cyanosis or edema. Has good range motion of his joints. Skin exam no rashes, ecchymosis or petechia. He does have rosacea on his face. Neurological exam is nonfocal.  Lab Results  Component Value Date   WBC 16.2* 06/20/2014   HGB 14.1 06/20/2014   HCT 46.2 06/20/2014   MCV 70* 06/20/2014   PLT 229 06/20/2014     Chemistry      Component Value Date/Time   NA 136 04/04/2014 1520   K 4.4 04/04/2014 1520   CL 103 04/04/2014 1520   CO2 22 04/04/2014 1520   BUN 18 04/04/2014 1520   CREATININE 1.07 04/04/2014 1520      Component Value Date/Time   CALCIUM 8.6 04/04/2014 1520   ALKPHOS 691* 04/04/2014 1520   AST 29 04/04/2014 1520   ALT 28 04/04/2014 1520   BILITOT 1.0 04/04/2014  1520         Impression and Plan: Mr. [name] is 65 year old white male with polycythemia vera.   He is doing quite well. I'm glad that he and his wife had a great time down in [place] on vacation.  He does not need to be phlebotomized. I'm sure that when we see him back, we probably will have to phlebotomize him.  I'll plan to get him back in about 2 months.   Volanda Napoleon, MD 10/28/20151:01 PM

## 2015-10-29 ENCOUNTER — Encounter: Payer: Self-pay | Admitting: Internal Medicine

## 2015-11-28 ENCOUNTER — Encounter: Payer: Self-pay | Admitting: Internal Medicine

## 2015-12-09 DIAGNOSIS — M720 Palmar fascial fibromatosis [Dupuytren]: Secondary | ICD-10-CM | POA: Insufficient documentation

## 2015-12-14 ENCOUNTER — Emergency Department (HOSPITAL_COMMUNITY): Payer: Medicare Other

## 2015-12-14 ENCOUNTER — Inpatient Hospital Stay (HOSPITAL_COMMUNITY)
Admission: EM | Admit: 2015-12-14 | Discharge: 2015-12-20 | DRG: 330 | Disposition: A | Discharge status: Home Health | Payer: Medicare Other | Attending: General Surgery | Admitting: General Surgery

## 2015-12-14 ENCOUNTER — Encounter (HOSPITAL_COMMUNITY): Payer: Self-pay | Admitting: Emergency Medicine

## 2015-12-14 DIAGNOSIS — D45 Polycythemia vera: Secondary | ICD-10-CM | POA: Diagnosis present

## 2015-12-14 DIAGNOSIS — Z86718 Personal history of other venous thrombosis and embolism: Secondary | ICD-10-CM

## 2015-12-14 DIAGNOSIS — Z8249 Family history of ischemic heart disease and other diseases of the circulatory system: Secondary | ICD-10-CM

## 2015-12-14 DIAGNOSIS — R161 Splenomegaly, not elsewhere classified: Secondary | ICD-10-CM | POA: Diagnosis present

## 2015-12-14 DIAGNOSIS — K255 Chronic or unspecified gastric ulcer with perforation: Principal | ICD-10-CM | POA: Diagnosis present

## 2015-12-14 DIAGNOSIS — E860 Dehydration: Secondary | ICD-10-CM | POA: Diagnosis present

## 2015-12-14 DIAGNOSIS — K631 Perforation of intestine (nontraumatic): Secondary | ICD-10-CM

## 2015-12-14 DIAGNOSIS — R1011 Right upper quadrant pain: Secondary | ICD-10-CM | POA: Diagnosis not present

## 2015-12-14 DIAGNOSIS — N179 Acute kidney failure, unspecified: Secondary | ICD-10-CM | POA: Diagnosis present

## 2015-12-14 DIAGNOSIS — R109 Unspecified abdominal pain: Secondary | ICD-10-CM

## 2015-12-14 DIAGNOSIS — Z7982 Long term (current) use of aspirin: Secondary | ICD-10-CM

## 2015-12-14 DIAGNOSIS — Z833 Family history of diabetes mellitus: Secondary | ICD-10-CM

## 2015-12-14 DIAGNOSIS — B9681 Helicobacter pylori [H. pylori] as the cause of diseases classified elsewhere: Secondary | ICD-10-CM | POA: Diagnosis present

## 2015-12-14 DIAGNOSIS — K567 Ileus, unspecified: Secondary | ICD-10-CM | POA: Diagnosis not present

## 2015-12-14 LAB — CBC
HCT: 55 % — ABNORMAL HIGH (ref 39.0–52.0)
Hemoglobin: 16.7 g/dL (ref 13.0–17.0)
MCH: 20.5 pg — AB (ref 26.0–34.0)
MCHC: 30.4 g/dL (ref 30.0–36.0)
MCV: 67.5 fL — AB (ref 78.0–100.0)
PLATELETS: 360 10*3/uL (ref 150–400)
RBC: 8.14 MIL/uL — ABNORMAL HIGH (ref 4.22–5.81)
RDW: 19.9 % — ABNORMAL HIGH (ref 11.5–15.5)
WBC: 26 10*3/uL — ABNORMAL HIGH (ref 4.0–10.5)

## 2015-12-14 LAB — URINE MICROSCOPIC-ADD ON

## 2015-12-14 LAB — URINALYSIS, ROUTINE W REFLEX MICROSCOPIC
GLUCOSE, UA: NEGATIVE mg/dL
Hgb urine dipstick: NEGATIVE
KETONES UR: 15 mg/dL — AB
NITRITE: NEGATIVE
PROTEIN: NEGATIVE mg/dL
Specific Gravity, Urine: 1.022 (ref 1.005–1.030)
pH: 5.5 (ref 5.0–8.0)

## 2015-12-14 LAB — COMPREHENSIVE METABOLIC PANEL
ALBUMIN: 4.6 g/dL (ref 3.5–5.0)
ALK PHOS: 999 U/L — AB (ref 38–126)
ALT: 24 U/L (ref 17–63)
AST: 25 U/L (ref 15–41)
Anion gap: 13 (ref 5–15)
BUN: 16 mg/dL (ref 6–20)
CALCIUM: 9.7 mg/dL (ref 8.9–10.3)
CHLORIDE: 100 mmol/L — AB (ref 101–111)
CO2: 23 mmol/L (ref 22–32)
CREATININE: 0.97 mg/dL (ref 0.61–1.24)
GFR calc Af Amer: >60 mL/min (ref >60)
GFR calc non Af Amer: >60 mL/min (ref >60)
GLUCOSE: 181 mg/dL — AB (ref 65–99)
Potassium: 4 mmol/L (ref 3.5–5.1)
SODIUM: 136 mmol/L (ref 135–145)
Total Bilirubin: 1.7 mg/dL — ABNORMAL HIGH (ref 0.3–1.2)
Total Protein: 7.2 g/dL (ref 6.5–8.1)

## 2015-12-14 LAB — I-STAT CG4 LACTIC ACID, ED: LACTIC ACID, VENOUS: 1.97 mmol/L (ref 0.5–2.0)

## 2015-12-14 LAB — LIPASE, BLOOD: LIPASE: 19 U/L (ref 11–51)

## 2015-12-14 MED ORDER — ONDANSETRON 4 MG PO TBDP
4.0000 mg | ORAL_TABLET | Freq: Once | ORAL | Status: AC | PRN
  Administered 2015-12-14: 4 mg via ORAL
  Filled 2015-12-14: qty 1

## 2015-12-14 MED ORDER — OXYCODONE-ACETAMINOPHEN 5-325 MG PO TABS
1.0000 | ORAL_TABLET | ORAL | Status: DC | PRN
  Administered 2015-12-14: 1 via ORAL
  Filled 2015-12-14: qty 1

## 2015-12-14 MED ORDER — MORPHINE SULFATE (PF) 4 MG/ML IV SOLN: 4.0000 mg | Freq: Once | INTRAVENOUS | Status: DC

## 2015-12-14 MED ORDER — SODIUM CHLORIDE 0.9 % IV BOLUS (SEPSIS)
1000.0000 mL | Freq: Once | INTRAVENOUS | Status: AC
  Administered 2015-12-14: 1000 mL via INTRAVENOUS

## 2015-12-14 MED ORDER — HYDROMORPHONE HCL 1 MG/ML IJ SOLN
1.0000 mg | Freq: Once | INTRAMUSCULAR | Status: AC
  Administered 2015-12-14: 1 mg via INTRAVENOUS
  Filled 2015-12-14: qty 1

## 2015-12-14 MED ORDER — MORPHINE SULFATE (PF) 2 MG/ML IV SOLN
2.0000 mg | Freq: Once | INTRAVENOUS | Status: DC
  Administered 2015-12-14: 2 mg via INTRAVENOUS
  Filled 2015-12-14: qty 1

## 2015-12-14 NOTE — ED Notes (Signed)
Accidentally clicked off order to maintain IV access

## 2015-12-14 NOTE — ED Provider Notes (Signed)
Pt seen and evaluated.  D/W Jackson Latino. Pa-C.  fisher reports abdominal pain starting partially 3:00 this afternoon. Not immediately after a meal. Started mid right abdomen rating to lower abdomen shoulder. 1 past similar episode a year ago she was evaluated for an urgent care center. At times that he had "food poisoning". States had gallbladder wall thickening. Follow-up with his primary care physician. Did not require any further intervention for this because symptoms have not recurred. Has history of polycythemia. Chronically elevated hemoglobin, WBC, and alkaline phosphatase. On his labs today these are elevated somewhat above his baseline. I agree with pain control and ultrasound. May need additional studies of ultrasound is unrevealing.  Exam shows midabdominal tenderness. No peritoneal irritation. Normal lactate.    Tanna Furry, MD 12/14/15 [phone]

## 2015-12-14 NOTE — ED Provider Notes (Signed)
CSN: [id number]     Arrival date & time 12/14/15  1636 History   First MD Initiated Contact with Patient 12/14/15 1950     Chief Complaint  Patient presents with  . Abdominal Pain     (Consider location/radiation/quality/duration/timing/severity/associated sxs/prior Treatment) Patient is a 65 y.o. male presenting with abdominal pain. The history is provided by the patient and the spouse.  Abdominal Pain Associated symptoms: nausea and vomiting   Associated symptoms: no chest pain, no chills, no constipation, no cough, no diarrhea, no dysuria, no fatigue, no fever, no hematuria and no shortness of breath    Patient is a 65 y/o with a history of polycythemia vera and DVT who presents to the ED with abdominal pain since 1500 today. Pain started in his right flank and RUQ and radiates up to his right shoulder and down to his RLQ. Pain is sharp, 9/10, constant with associated vomiting x 3 without blood and diaphoresis. Was given a percocet in triage and states his pain is now a 8/10. He last ate at 1330. He denies diarrhea, constipation, blood in his stool, recent illness, dizziness, headache, weakness, numbness.   Pt is followed by Burney Gauze, MD oncologist for his polycythemia vera and states he has not had to have phlebotomy in some time. Dr. Marin Olp likes to keep his HCT below 41. He was last seen by Dr. Marin Olp on 10/28/2015.   Past Medical History  Diagnosis Date  . P. vera ([place]) 08/19/2011  . Polycythemia   . Anemia   . DVT (deep venous thrombosis) ([name])     hx -lt leg-dx polycythemia  . Focal dystonia   . Clotting disorder [facility])    Past Surgical History  Procedure Laterality Date  . Wisdom tooth extraction    . Finger arthroplasty  1998    rt index  . Inguinal hernia repair  2007    left  . Inguinal hernia repair  2009    right  . Colonoscopy    . Dupuytren contracture release Left 08/08/2013    Procedure: DUPUYTREN CONTRACTURE RELEASE LEFT HAND;  Surgeon: Cammie Sickle., MD;  Location: [facility];  Service: Orthopedics;  Laterality: Left;  . Colonoscopy    . Hernia repair     Family History  Problem Relation Age of Onset  . Colon cancer Neg Hx   . Heart disease Father   . Hyperlipidemia Father   . Hypertension Father   . Diabetes Father   . Diabetes Brother   . Hyperlipidemia Brother    Social History  Substance Use Topics  . Smoking status: Never Smoker   . Smokeless tobacco: Never Used     Comment: never used tobacco  . Alcohol Use: 0.0 oz/week    0 Standard drinks or equivalent per week     Comment: almost daily wine    Review of Systems  Constitutional: Positive for diaphoresis. Negative for fever, chills and fatigue.  HENT: Negative for hearing loss and voice change.   Eyes: Negative for visual disturbance.  Respiratory: Negative for cough, chest tightness and shortness of breath.   Cardiovascular: Negative for chest pain and leg swelling.  Gastrointestinal: Positive for nausea, vomiting and abdominal pain. Negative for diarrhea, constipation, blood in stool and abdominal distention.  Genitourinary: Negative for dysuria and hematuria.  Musculoskeletal: Negative for myalgias and back pain.  Skin: Negative for rash.  Neurological: Negative for dizziness, syncope, weakness, light-headedness, numbness and headaches.  Psychiatric/Behavioral: Negative for confusion.  Allergies  Keflex  Home Medications   Prior to Admission medications   Medication Sig Start Date End Date Taking? Authorizing Provider  aspirin 325 MG tablet Take 325 mg by mouth daily.     Yes Historical Provider, MD  famotidine (PEPCID AC) 10 MG chewable tablet Chew 10 mg by mouth daily as needed for heartburn.   Yes Historical Provider, MD  Magnesium 400 MG CAPS Take by mouth every morning.   Yes Historical Provider, MD  naproxen sodium (ANAPROX) 220 MG tablet Take 440 mg by mouth 2 (two) times daily as needed (pain).   Yes Historical Provider,  MD  Omega-3 Fatty Acids (FISH OIL) 1000 MG CAPS Take by mouth every morning.   Yes Historical Provider, MD  sildenafil (VIAGRA) 25 MG tablet Take 2-5 tablets as needed for sexual activity 01/12/14  Yes Volanda Napoleon, MD  sulfamethoxazole-trimethoprim (BACTRIM DS) 800-160 MG per tablet Take 1 tablet by mouth daily.    Yes Historical Provider, MD  Vitamin E 400 UNITS TABS Take by mouth every morning.   Yes Historical Provider, MD   BP 119/62 mmHg  Pulse 93  Temp(Src) 97.7 F (36.5 C)  Resp 16  SpO2 93% Physical Exam  Constitutional: He is oriented to person, place, and time. He appears well-developed and well-nourished. No distress.  HENT:  Head: Normocephalic and atraumatic.  Eyes: Conjunctivae are normal.  Neck: Normal range of motion.  Cardiovascular: Normal rate, regular rhythm and normal heart sounds.   Pulses:      Dorsalis pedis pulses are 2+ on the right side, and 2+ on the left side.  Pulmonary/Chest: Effort normal and breath sounds normal. No respiratory distress.  Abdominal: Soft. Bowel sounds are normal. He exhibits no distension.  No deformities or incisional scars, TTP of all quadrants, exquisitely tender of the epigastric and RUQ region, positive Murphy's sign  Neurological: He is alert and oriented to person, place, and time. No sensory deficit. Coordination normal.  Skin: Skin is warm and dry. No rash noted. He is not diaphoretic.  Psychiatric: He has a normal mood and affect. His behavior is normal.  Nursing note and vitals reviewed.   ED Course  Procedures (including critical care time) Labs Review Labs Reviewed  COMPREHENSIVE METABOLIC PANEL - Abnormal; Notable for the following:    Chloride 100 (*)    Glucose, Bld 181 (*)    Alkaline Phosphatase 999 (*)    Total Bilirubin 1.7 (*)    All other components within normal limits  CBC - Abnormal; Notable for the following:    WBC 26.0 (*)    RBC 8.14 (*)    HCT 55.0 (*)    MCV 67.5 (*)    MCH 20.5 (*)     RDW 19.9 (*)    All other components within normal limits  URINALYSIS, ROUTINE W REFLEX MICROSCOPIC (NOT AT [facility]) - Abnormal; Notable for the following:    Color, Urine ORANGE (*)    Bilirubin Urine MODERATE (*)    Ketones, ur 15 (*)    Leukocytes, UA SMALL (*)    All other components within normal limits  URINE MICROSCOPIC-ADD ON - Abnormal; Notable for the following:    Squamous Epithelial / LPF 0-5 (*)    Bacteria, UA FEW (*)    All other components within normal limits  LIPASE, BLOOD  I-STAT CG4 LACTIC ACID, ED    Imaging Review US Abdomen Complete  12/15/2015  CLINICAL DATA:  Right upper quadrant pain for 1  day. EXAM: ABDOMEN ULTRASOUND COMPLETE COMPARISON:  Ultrasound 12/14/2014 FINDINGS: Gallbladder: Physiologically distended. The previous edematous gallbladder wall has resolved. Wall thickness currently normal at 3 mm. No sonographic Murphy sign noted by sonographer. Common bile duct: Diameter: 5.4 mm. Liver: No focal lesion identified. Within normal limits in parenchymal echogenicity. Normal directional flow in the main portal vein. IVC: No abnormality visualized. Pancreas: Visualized portion unremarkable. Spleen: Spleen is enlarged measuring 12.7 x 17.3 x 9.9 cm for a volume of 1137 cc. Right Kidney: Length: 11.2 cm. Echogenicity within normal limits. No mass or hydronephrosis visualized. Left Kidney: Length: 10.3 cm. Echogenicity within normal limits. No mass or hydronephrosis visualized. Abdominal aorta: No aneurysm visualized. Other findings: Small volume of ascites. IMPRESSION: 1. Splenomegaly. 2. Small volume intra-abdominal ascites. 3. Resolution of previous gallbladder wall thickening. Gallbladder appears normal. Electronically Signed   By: Jeb Levering M.D.   On: 12/15/2015 00:38   I have personally reviewed and evaluated these images and lab results as part of my medical decision-making.    MDM   Final diagnoses:  Abdominal pain, acute  Polycythemia vera ([place])   History of DVT (deep vein thrombosis)    Patient is a 65 y/o with a history of polycythemia vera and DVT who presents to the ED with abdominal pain. Afebrile, well appearing pt with elevated bilirubin, RUQ pain and symptoms concerning for acute cholecystitis or bilary colic. WBC elevated above baseline suggesting an infection. Made pt NPO, gave fluids, gave pain meds. Ordered a abdominal ultrasound. If it is negative will order a CTA abdomin to rule out ischemic bowel with history of DVT and polycythemia vera. Lactate was normal. No urinary symptoms. Alk phos noted to be high above even baseline but likely 2/2 his polycythemia vera.  Dr. Jeneen Rinks saw the patient and agrees with the plan.  Reassessed patient at [facility] and informed him his ultrasound was negative for acute cholecystitis. Discussed the plan to get a CTA to rule out ischemic bowel. Patient agrees with the plan. He states his pain is now 5/10 after 32m of dilaudid.   Patient care [facility] of [facility] pending [facility]A who will follow imaging, reassess and determine disposition.  JKalman Drape PA 12/15/15 0153  MTanna Furry MD 12/27/15 [phone]

## 2015-12-14 NOTE — ED Notes (Signed)
Pt reports he began to have R flank pian that then radiated to RLQ before radiating up into RUQ and shoulder. Pain consistent in RUQ. Did throw up this am.

## 2015-12-15 ENCOUNTER — Encounter (HOSPITAL_COMMUNITY): Admission: EM | Disposition: A | Discharge status: Home Health | Payer: Self-pay | Source: Home / Self Care

## 2015-12-15 ENCOUNTER — Encounter (HOSPITAL_COMMUNITY): Payer: Self-pay | Admitting: *Deleted

## 2015-12-15 ENCOUNTER — Emergency Department (HOSPITAL_COMMUNITY): Payer: Medicare Other

## 2015-12-15 ENCOUNTER — Emergency Department (HOSPITAL_COMMUNITY): Payer: Medicare Other | Admitting: Anesthesiology

## 2015-12-15 DIAGNOSIS — Z833 Family history of diabetes mellitus: Secondary | ICD-10-CM | POA: Diagnosis not present

## 2015-12-15 DIAGNOSIS — R1011 Right upper quadrant pain: Secondary | ICD-10-CM | POA: Diagnosis present

## 2015-12-15 DIAGNOSIS — Z7982 Long term (current) use of aspirin: Secondary | ICD-10-CM

## 2015-12-15 DIAGNOSIS — E860 Dehydration: Secondary | ICD-10-CM | POA: Diagnosis present

## 2015-12-15 DIAGNOSIS — Z8249 Family history of ischemic heart disease and other diseases of the circulatory system: Secondary | ICD-10-CM | POA: Diagnosis not present

## 2015-12-15 DIAGNOSIS — B9681 Helicobacter pylori [H. pylori] as the cause of diseases classified elsewhere: Secondary | ICD-10-CM | POA: Diagnosis present

## 2015-12-15 DIAGNOSIS — R161 Splenomegaly, not elsewhere classified: Secondary | ICD-10-CM

## 2015-12-15 DIAGNOSIS — N179 Acute kidney failure, unspecified: Secondary | ICD-10-CM | POA: Diagnosis not present

## 2015-12-15 DIAGNOSIS — D45 Polycythemia vera: Secondary | ICD-10-CM

## 2015-12-15 DIAGNOSIS — K255 Chronic or unspecified gastric ulcer with perforation: Secondary | ICD-10-CM | POA: Diagnosis not present

## 2015-12-15 DIAGNOSIS — K567 Ileus, unspecified: Secondary | ICD-10-CM | POA: Diagnosis not present

## 2015-12-15 HISTORY — PX: LAPAROTOMY: SHX154

## 2015-12-15 LAB — ABO/RH: ABO/RH(D): B POS

## 2015-12-15 SURGERY — LAPAROTOMY, EXPLORATORY
Anesthesia: General | Site: Abdomen

## 2015-12-15 MED ORDER — CHLORHEXIDINE GLUCONATE 0.12 % MT SOLN
15.0000 mL | Freq: Two times a day (BID) | OROMUCOSAL | Status: DC
  Administered 2015-12-16 – 2015-12-20 (×9): 15 mL via OROMUCOSAL
  Filled 2015-12-15 (×11): qty 15

## 2015-12-15 MED ORDER — CETYLPYRIDINIUM CHLORIDE 0.05 % MT LIQD
7.0000 mL | Freq: Two times a day (BID) | OROMUCOSAL | Status: DC
  Administered 2015-12-15 – 2015-12-20 (×7): 7 mL via OROMUCOSAL

## 2015-12-15 MED ORDER — ENOXAPARIN SODIUM 40 MG/0.4ML ~~LOC~~ SOLN
40.0000 mg | Freq: Two times a day (BID) | SUBCUTANEOUS | Status: DC
  Filled 2015-12-15: qty 0.4

## 2015-12-15 MED ORDER — MORPHINE SULFATE (PF) 2 MG/ML IV SOLN
2.0000 mg | INTRAVENOUS | Status: DC | PRN
  Administered 2015-12-15: 2 mg via INTRAVENOUS
  Administered 2015-12-15 – 2015-12-16 (×2): 4 mg via INTRAVENOUS
  Administered 2015-12-16: 2 mg via INTRAVENOUS
  Filled 2015-12-15: qty 2
  Filled 2015-12-15: qty 1
  Filled 2015-12-15 (×2): qty 2

## 2015-12-15 MED ORDER — SODIUM CHLORIDE 0.9 % IV SOLN
80.0000 mg | Freq: Two times a day (BID) | INTRAVENOUS | Status: DC
  Administered 2015-12-15 – 2015-12-20 (×11): 80 mg via INTRAVENOUS
  Filled 2015-12-15 (×18): qty 80

## 2015-12-15 MED ORDER — ONDANSETRON HCL 4 MG/2ML IJ SOLN
4.0000 mg | Freq: Four times a day (QID) | INTRAMUSCULAR | Status: DC | PRN
  Administered 2015-12-16: 4 mg via INTRAVENOUS
  Filled 2015-12-15: qty 2

## 2015-12-15 MED ORDER — SUCCINYLCHOLINE CHLORIDE 20 MG/ML IJ SOLN
INTRAMUSCULAR | Status: DC | PRN
  Administered 2015-12-15: 100 mg via INTRAVENOUS

## 2015-12-15 MED ORDER — DEXAMETHASONE SODIUM PHOSPHATE 10 MG/ML IJ SOLN
INTRAMUSCULAR | Status: DC | PRN
  Administered 2015-12-15: 10 mg via INTRAVENOUS

## 2015-12-15 MED ORDER — ENOXAPARIN SODIUM 40 MG/0.4ML ~~LOC~~ SOLN
40.0000 mg | SUBCUTANEOUS | Status: DC
  Administered 2015-12-15 – 2015-12-19 (×5): 40 mg via SUBCUTANEOUS
  Filled 2015-12-15 (×6): qty 0.4

## 2015-12-15 MED ORDER — ONDANSETRON HCL 4 MG/2ML IJ SOLN: 4.0000 mg | Freq: Once | INTRAMUSCULAR | Status: DC | PRN

## 2015-12-15 MED ORDER — ROCURONIUM BROMIDE 100 MG/10ML IV SOLN
INTRAVENOUS | Status: DC | PRN
  Administered 2015-12-15: 50 mg via INTRAVENOUS

## 2015-12-15 MED ORDER — ONDANSETRON 4 MG PO TBDP: 4.0000 mg | ORAL_TABLET | Freq: Four times a day (QID) | ORAL | Status: DC | PRN

## 2015-12-15 MED ORDER — PHENYLEPHRINE HCL 10 MG/ML IJ SOLN
INTRAMUSCULAR | Status: DC | PRN
  Administered 2015-12-15 (×3): 120 ug via INTRAVENOUS

## 2015-12-15 MED ORDER — LIDOCAINE HCL (CARDIAC) 20 MG/ML IV SOLN
INTRAVENOUS | Status: AC
  Filled 2015-12-15: qty 5

## 2015-12-15 MED ORDER — PROPOFOL 10 MG/ML IV BOLUS
INTRAVENOUS | Status: DC | PRN
  Administered 2015-12-15: 140 mg via INTRAVENOUS

## 2015-12-15 MED ORDER — LACTATED RINGERS IV SOLN
INTRAVENOUS | Status: DC | PRN
  Administered 2015-12-15 (×2): via INTRAVENOUS

## 2015-12-15 MED ORDER — LIP MEDEX EX OINT
TOPICAL_OINTMENT | CUTANEOUS | Status: AC
  Administered 2015-12-15: 1
  Filled 2015-12-15: qty 7

## 2015-12-15 MED ORDER — 0.9 % SODIUM CHLORIDE (POUR BTL) OPTIME
TOPICAL | Status: DC | PRN
  Administered 2015-12-15: 6000 mL

## 2015-12-15 MED ORDER — IOPAMIDOL (ISOVUE-370) INJECTION 76%
100.0000 mL | Freq: Once | INTRAVENOUS | Status: AC | PRN
  Administered 2015-12-15: 100 mL via INTRAVENOUS

## 2015-12-15 MED ORDER — HYDROMORPHONE HCL 1 MG/ML IJ SOLN
1.0000 mg | Freq: Once | INTRAMUSCULAR | Status: AC
  Administered 2015-12-15: 1 mg via INTRAVENOUS
  Filled 2015-12-15: qty 1

## 2015-12-15 MED ORDER — IMIPENEM-CILASTATIN 500 MG IV SOLR
500.0000 mg | Freq: Four times a day (QID) | INTRAVENOUS | Status: DC
  Administered 2015-12-15 – 2015-12-16 (×6): 500 mg via INTRAVENOUS
  Filled 2015-12-15 (×7): qty 500

## 2015-12-15 MED ORDER — MIDAZOLAM HCL 5 MG/5ML IJ SOLN
INTRAMUSCULAR | Status: DC | PRN
  Administered 2015-12-15: 2 mg via INTRAVENOUS

## 2015-12-15 MED ORDER — FENTANYL CITRATE (PF) 100 MCG/2ML IJ SOLN
INTRAMUSCULAR | Status: DC | PRN
  Administered 2015-12-15: 50 ug via INTRAVENOUS
  Administered 2015-12-15: 100 ug via INTRAVENOUS
  Administered 2015-12-15 (×2): 50 ug via INTRAVENOUS

## 2015-12-15 MED ORDER — MIDAZOLAM HCL 2 MG/2ML IJ SOLN
INTRAMUSCULAR | Status: AC
  Filled 2015-12-15: qty 2

## 2015-12-15 MED ORDER — FENTANYL CITRATE (PF) 250 MCG/5ML IJ SOLN
INTRAMUSCULAR | Status: AC
  Filled 2015-12-15: qty 5

## 2015-12-15 MED ORDER — SODIUM CHLORIDE 0.9 % IV SOLN: 500.0000 mg | Freq: Three times a day (TID) | INTRAVENOUS | Status: DC

## 2015-12-15 MED ORDER — ONDANSETRON HCL 4 MG/2ML IJ SOLN
INTRAMUSCULAR | Status: DC | PRN
  Administered 2015-12-15: 4 mg via INTRAVENOUS

## 2015-12-15 MED ORDER — ARTIFICIAL TEARS OP OINT
TOPICAL_OINTMENT | OPHTHALMIC | Status: AC
  Filled 2015-12-15: qty 3.5

## 2015-12-15 MED ORDER — LIDOCAINE HCL (CARDIAC) 20 MG/ML IV SOLN
INTRAVENOUS | Status: DC | PRN
  Administered 2015-12-15: 25 mg via INTRATRACHEAL
  Administered 2015-12-15: 100 mg via INTRAVENOUS

## 2015-12-15 MED ORDER — HYDROMORPHONE HCL 1 MG/ML IJ SOLN
0.2500 mg | INTRAMUSCULAR | Status: DC | PRN
Start: 2015-12-15 — End: 2015-12-15

## 2015-12-15 MED ORDER — LACTATED RINGERS IV SOLN
INTRAVENOUS | Status: DC
  Administered 2015-12-15: 125 mL/h via INTRAVENOUS
  Administered 2015-12-15 – 2015-12-16 (×2): via INTRAVENOUS

## 2015-12-15 MED ORDER — PROPOFOL 10 MG/ML IV BOLUS
INTRAVENOUS | Status: AC
  Filled 2015-12-15: qty 20

## 2015-12-15 MED ORDER — SODIUM CHLORIDE 0.9 % IV SOLN
Freq: Once | INTRAVENOUS | Status: AC
  Administered 2015-12-15: 05:00:00 via INTRAVENOUS

## 2015-12-15 MED ORDER — SODIUM CHLORIDE 0.9 % IV SOLN
INTRAVENOUS | Status: DC | PRN
  Administered 2015-12-15: 07:00:00 via INTRAVENOUS

## 2015-12-15 SURGICAL SUPPLY — 49 items
BLADE EXTENDED COATED 6.5IN (ELECTRODE) IMPLANT
BLADE HEX COATED 2.75 (ELECTRODE) ×3 IMPLANT
BLADE SURG SZ10 CARB STEEL (BLADE) ×3 IMPLANT
CHLORAPREP W/TINT 26ML (MISCELLANEOUS) ×3 IMPLANT
CLIP TI LARGE 6 (CLIP) IMPLANT
COVER MAYO STAND STRL (DRAPES) ×3 IMPLANT
COVER SURGICAL LIGHT HANDLE (MISCELLANEOUS) ×3 IMPLANT
DRAPE LAPAROSCOPIC ABDOMINAL (DRAPES) ×3 IMPLANT
DRAPE SHEET LG 3/4 BI-LAMINATE (DRAPES) IMPLANT
DRAPE UTILITY XL STRL (DRAPES) ×3 IMPLANT
DRAPE WARM FLUID 44X44 (DRAPE) ×3 IMPLANT
DRSG PAD ABDOMINAL 8X10 ST (GAUZE/BANDAGES/DRESSINGS) ×2 IMPLANT
ELECT REM PT RETURN 9FT ADLT (ELECTROSURGICAL) ×3
ELECTRODE REM PT RTRN 9FT ADLT (ELECTROSURGICAL) ×1 IMPLANT
GAUZE SPONGE 4X4 12PLY STRL (GAUZE/BANDAGES/DRESSINGS) ×3 IMPLANT
GLOVE BIO SURGEON STRL SZ 6.5 (GLOVE) ×2 IMPLANT
GLOVE BIO SURGEONS STRL SZ 6.5 (GLOVE) ×1
GLOVE BIOGEL PI IND STRL 7.0 (GLOVE) ×1 IMPLANT
GLOVE BIOGEL PI INDICATOR 7.0 (GLOVE) ×2
GOWN L4 XXLG W/PAP TWL (GOWN DISPOSABLE) ×3 IMPLANT
HANDLE SUCTION POOLE (INSTRUMENTS) ×1 IMPLANT
KIT BASIN OR (CUSTOM PROCEDURE TRAY) ×3 IMPLANT
LEGGING LITHOTOMY PAIR STRL (DRAPES) IMPLANT
LIGASURE IMPACT 36 18CM CVD LR (INSTRUMENTS) IMPLANT
NS IRRIG 1000ML POUR BTL (IV SOLUTION) ×6 IMPLANT
PACK GENERAL/GYN (CUSTOM PROCEDURE TRAY) ×3 IMPLANT
SHEARS HARMONIC ACE PLUS 36CM (ENDOMECHANICALS) IMPLANT
STAPLER VISISTAT 35W (STAPLE) ×3 IMPLANT
SUCTION POOLE HANDLE (INSTRUMENTS) ×3
SUT NOV 1 T60/GS (SUTURE) IMPLANT
SUT NOVA NAB GS-21 0 18 T12 DT (SUTURE) ×3 IMPLANT
SUT NOVA NAB GS-22 2 0 T19 (SUTURE) ×3 IMPLANT
SUT PDS AB 1 CTX 36 (SUTURE) IMPLANT
SUT PDS AB 1 TP1 96 (SUTURE) IMPLANT
SUT SILK 2 0 (SUTURE) ×12
SUT SILK 2 0 SH CR/8 (SUTURE) IMPLANT
SUT SILK 2 0SH CR/8 30 (SUTURE) IMPLANT
SUT SILK 2-0 18XBRD TIE 12 (SUTURE) ×2 IMPLANT
SUT SILK 2-0 30XBRD TIE 12 (SUTURE) ×2 IMPLANT
SUT SILK 3 0 (SUTURE)
SUT SILK 3 0 SH CR/8 (SUTURE) IMPLANT
SUT SILK 3-0 18XBRD TIE 12 (SUTURE) IMPLANT
SUT VIC AB 3-0 54XBRD REEL (SUTURE) IMPLANT
SUT VIC AB 3-0 BRD 54 (SUTURE)
TAPE CLOTH SURG 4X10 WHT LF (GAUZE/BANDAGES/DRESSINGS) ×2 IMPLANT
TOWEL OR 17X26 10 PK STRL BLUE (TOWEL DISPOSABLE) ×3 IMPLANT
TRAY FOLEY W/METER SILVER 14FR (SET/KITS/TRAYS/PACK) ×3 IMPLANT
TRAY FOLEY W/METER SILVER 16FR (SET/KITS/TRAYS/PACK) ×3 IMPLANT
YANKAUER SUCT BULB TIP NO VENT (SUCTIONS) ×3 IMPLANT

## 2015-12-15 NOTE — Anesthesia Preprocedure Evaluation (Signed)
Anesthesia Evaluation  Patient identified by MRN, date of birth, ID band Patient awake    Reviewed: Allergy & Precautions, H&P , NPO status , Patient's Chart, lab work & pertinent test results  Airway Mallampati: I  TM Distance: >3 FB Neck ROM: Full    Dental no notable dental hx. (+) Teeth Intact, Dental Advisory Given   Pulmonary neg pulmonary ROS,    Pulmonary exam normal breath sounds clear to auscultation       Cardiovascular negative cardio ROS   Rhythm:Regular Rate:Normal     Neuro/Psych negative neurological ROS  negative psych ROS   GI/Hepatic negative GI ROS, Neg liver ROS,   Endo/Other  negative endocrine ROS  Renal/GU negative Renal ROS  negative genitourinary   Musculoskeletal   Abdominal   Peds  Hematology  (+) Blood dyscrasia, , Polycythemia vera   Anesthesia Other Findings   Reproductive/Obstetrics negative OB ROS                             Anesthesia Physical  Anesthesia Plan  ASA: II and emergent  Anesthesia Plan: General   Post-op Pain Management:    Induction: Intravenous and Rapid sequence  Airway Management Planned: Oral ETT  Additional Equipment:   Intra-op Plan:   Post-operative Plan: Extubation in OR  Informed Consent: I have reviewed the patients History and Physical, chart, labs and discussed the procedure including the risks, benefits and alternatives for the proposed anesthesia with the patient or authorized representative who has indicated his/her understanding and acceptance.   Dental advisory given  Plan Discussed with: CRNA and Surgeon  Anesthesia Plan Comments:         Anesthesia Quick Evaluation

## 2015-12-15 NOTE — ED Notes (Signed)
PA at bedside.

## 2015-12-15 NOTE — Consult Note (Signed)
Referral MD  Reason for Referral: Perforated gastric ulcer secondary to aspirin use. Polycythemia vera.   Chief Complaint  Patient presents with  . Abdominal Pain  : I had surgery this morning.  HPI:  [name] is well-known to me. I probably know him for 10 years. He is a 65 year old gentleman with polycythemia vera. He is JAK2 positive. We have had him on a phlebotomy program to keep his hematocrit below 45% and he's been on low-dose aspirin 81 mg by mouth daily.  He was last phlebotomized back in December. I last saw him back in March.  He recently had surgery for Dupuytren's contracture of his fifth digit on the left hand. He was taking some nonsteroidals for about 3 days.  He had abdominal pain yesterday. He had no nausea or vomiting. The pain did not improve.  He came to the emergency room. He ultimately was found to have free air under the diaphragm. He has some mild splenomegaly.  He then had laparoscopic exploratory surgery. This was done with incredible technical efficiency by Dr. Marcello Moores. He was found have a perforated prepyloric ulcer. It is felt that this was related to aspirin use.  He got through surgery without difficulties.  He actually looks quite good. He is quite conversant this morning. His wife was with [place].  Surprisingly enough, when he came in, he was markedly dehydrated. His hemoglobin was 16.7 with a hematocrit of 55%. His white cell count was 22,000. His alkaline phosphates was quite elevated. I'm sure this was secondary to his polycythemia.  He had no fever. 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 I think this wedding is one that I'm supposed to go to.     Past Medical History  Diagnosis Date  . P. vera ([place]) 08/19/2011  . Polycythemia   . Anemia   . DVT (deep venous thrombosis) ([name])     hx -lt leg-dx polycythemia  . Focal dystonia   . Clotting disorder [facility])   :  Past  Surgical History  Procedure Laterality Date  . Wisdom tooth extraction    . Finger arthroplasty  1998    rt index  . Inguinal hernia repair  2007    left  . Inguinal hernia repair  2009    right  . Colonoscopy    . Dupuytren contracture release Left 08/08/2013    Procedure: DUPUYTREN CONTRACTURE RELEASE LEFT HAND;  Surgeon: Cammie Sickle., MD;  Location: [facility];  Service: Orthopedics;  Laterality: Left;  . Colonoscopy    . Hernia repair    :   Current facility-administered medications:  .  antiseptic oral rinse (CPC / CETYLPYRIDINIUM CHLORIDE 0.05%) solution 7 mL, 7 mL, Mouth Rinse, [id number], Leighton Ruff, MD, 7 mL at 12/15/15 1209 .  chlorhexidine (PERIDEX) 0.12 % solution 15 mL, 15 mL, Mouth Rinse, BID, Leighton Ruff, MD, 15 mL at 12/15/15 1209 .  enoxaparin (LOVENOX) injection 40 mg, 40 mg, Subcutaneous, [id number], Leighton Ruff, MD .  imipenem-cilastatin (PRIMAXIN) 500 mg in sodium chloride 0.9 % 100 mL IVPB, 500 mg, Intravenous, Q6H, Shari [place]still, PA-C, Last Rate: 200 mL/hr at 12/15/15 1209, 500 mg at 12/15/15 1209 .  lactated ringers infusion, , Intravenous, Continuous, Leighton Ruff, MD, Last Rate: 125 mL/hr at 12/15/15 1043, 125 mL/hr at 12/15/15 1043 .  morphine 2 MG/ML injection 2-4 mg, 2-4 mg, Intravenous, [id number] PRN, Leighton Ruff, MD .  ondansetron (ZOFRAN-ODT) disintegrating tablet 4 mg, 4 mg, Oral, Q6H PRN **OR** ondansetron (ZOFRAN) injection 4 mg, 4 mg, Intravenous, [id number] PRN, Leighton Ruff, MD .  pantoprazole (PROTONIX) 80 mg in sodium chloride 0.9 % 100 mL IVPB, 80 mg, Intravenous, [id number], Leighton Ruff, MD, 80 mg at 12/15/15 1041:  . antiseptic oral rinse  7 mL Mouth Rinse q12n4p  . chlorhexidine  15 mL Mouth Rinse BID  . enoxaparin (LOVENOX) injection  40 mg Subcutaneous Q24H  . imipenem-cilastatin  500 mg Intravenous Q6H  . pantoprazole (PROTONIX) IV  80 mg Intravenous Q12H  :  Allergies  Allergen Reactions  . Keflex [Cephalexin] Hives   :  Family History  Problem Relation Age of Onset  . Colon cancer Neg Hx   . Heart disease Father   . Hyperlipidemia Father   . Hypertension Father   . Diabetes Father   . Diabetes Brother   . Hyperlipidemia Brother   :  Social History   Social History  . Marital Status: Married    Spouse Name: N/A  . Number of Children: N/A  . Years of Education: N/A   Occupational History  . Not on file.   Social History Main Topics  . Smoking status: Never Smoker   . Smokeless tobacco: Never Used     Comment: never used tobacco  . Alcohol Use: 0.0 oz/week    0 Standard drinks or equivalent per week     Comment: almost daily wine  . Drug Use: No  . Sexual Activity: Not on file   Other Topics Concern  . Not on file   Social History Narrative  :  Pertinent items are noted in HPI.  Exam: Patient Vitals for the past 24 hrs:  BP Temp Temp src Pulse Resp SpO2 Weight  12/15/15 1239 126/62 mmHg 98.7 F (37.1 C) Axillary 100 20 99 % -  12/15/15 1140 97/77 mmHg 98.6 F (37 C) Axillary 98 20 98 % -  12/15/15 1032 138/62 mmHg 98.6 F (37 C) Axillary (!) 101 20 97 % -  12/15/15 0930 137/65 mmHg 98.2 F (36.8 C) - 97 (!) 22 97 % -  12/15/15 0915 130/63 mmHg - - (!) 102 (!) 28 97 % -  12/15/15 0911 129/72 mmHg - - (!) 105 (!) 22 98 % -  12/15/15 0901 130/62 mmHg - - (!) 101 (!) 29 98 % -  12/15/15 0900 134/63 mmHg 98.9 F (37.2 C) - (!) 103 (!) 23 98 % -  12/15/15 0847 115/61 mmHg - - (!) 101 (!) 24 99 % -  12/15/15 0845 118/61 mmHg - - (!) 103 (!) 26 99 % -  12/15/15 0830 138/68 mmHg - - (!) 104 (!) 31 98 % -  12/15/15 0823 (!) 141/71 mmHg - - - (!) 29 - -  12/15/15 0822 139/72 mmHg - - - (!) 28 - -  12/15/15 0820 (!) 143/72 mmHg 98.9 F (37.2 C) - (!) 106 (!) 28 99 % -  12/15/15 0815 - 98.9 F (37.2 C) - - - - -  12/15/15 0433 - - - - - - 167 lb 1.7 oz (75.8 kg)  12/15/15 0343 121/67 mmHg 98.2 F (36.8 C) Oral 92 17 97 % -  12/15/15 0035 134/71 mmHg 98.4 F (36.9 C) Oral  89 20 92 % -  12/14/15 1945 119/62 mmHg - - 93 16 93 % -  12/14/15 1728 156/76 mmHg 97.7 F (36.5 C) - 88 16 100 % -  Well-developed and well-nourished white male in no obvious distress. He has a nasogastric tube in place. There is no oral lesion. He has no adenopathy in the neck. Lungs are clear bilaterally. Cardiac exam regular rate and rhythm with no murmurs, rubs or bruits. Abdomen is quiet. He has dressings on the laparotomy scars. There is no abdominal distention. Extremities shows no clubbing, cyanosis or edema. Skin exam shows no rashes, ecchymosis or petechia. Neurological exam is nonfocal.   Recent Labs  12/14/15 1739  WBC 26.0*  HGB 16.7  HCT 55.0*  PLT 360    Recent Labs  12/14/15 1739  NA 136  K 4.0  CL 100*  CO2 23  GLUCOSE 181*  BUN 16  CREATININE 0.97  CALCIUM 9.7    Blood smear review:  as above  Pathology: None     Assessment and Plan:  Mr. Ditter is a 65 year old white male. He has long-standing polycythemia. He's had no complications from this. He has been on baby aspirin for 10 years or more.  Unfortunately I don't think we can have him back on aspirin. But I think we will still be okay. The other option that we would would have would be Hydrea. I don't think we have to do this at this point. We'll have to watch his white cell count closely. This may be the indicator as to whether or not we need Hydrea. Studies have shown that the risk of stroke is related to the white cell count. His white cell count has been mildly elevated but again nothing that we really needed to treat.  The splenomegaly is stable. He had an ultrasound done back in December which basically showed the same spleen size.  I'm sure that he will be out of bed in a day or so. Hopefully, the Foley catheter will come out. I suppose the NG tube might take a little bit longer.  I don't see that he is at any greater risk for thrombus. The Lovenox that he is on his adequate. He also has  compression devices on his legs.  We will follow along him along while he is in the hospital.  I very much appreciate the outstanding care that he is gotten on 5 [address].  Pete E.  Isaiah 53:5-6

## 2015-12-15 NOTE — Anesthesia Postprocedure Evaluation (Signed)
Anesthesia Post Note  Patient: Benjamin [name]  Procedure(s) Performed: Procedure(s) (LRB): EXPLORATORY LAPAROTOMY (N/A)  Patient location during evaluation: PACU Anesthesia Type: General Level of consciousness: awake and alert Pain management: pain level controlled Vital Signs Assessment: post-procedure vital signs reviewed and stable Respiratory status: spontaneous breathing, nonlabored ventilation, respiratory function stable and patient connected to nasal cannula oxygen Cardiovascular status: blood pressure returned to baseline and stable Postop Assessment: no signs of nausea or vomiting Anesthetic complications: no    Last Vitals:  Filed Vitals:   12/15/15 0815 12/15/15 0820  BP:  143/72  Pulse:  106  Temp: 37.2 C 37.2 C  Resp:  28    Last Pain:  Filed Vitals:   12/15/15 0831  PainSc: 2                  Valdis Bevill S

## 2015-12-15 NOTE — ED Provider Notes (Addendum)
RUQ radiating down to RLQ and up to right shoulder that started today around 3:00. Nausea with vomiting. Non-bloody. H/O polycythemia vera - elevated studies today [place] - negative. CT pending to r/o ischemic bowel.   Plan:  Review CT and re-evaluate.   CT scan showing perforated bowel, likely at upper GI origin. Re-evaluation: patient is resting without complaint. He has generalized abdominal tenderness without significant guarding and no rigidity. VSS - afebrile, no tachycardia, hypotension. Discussed with Dr. Marcello Moores (gen. Surgery) who will see the patient in the ED. Imipenem ordered after consultation with pharmacy regarding allergy red flag.    Charlann Lange, PA-C 12/15/15 0503  Tanna Furry, MD 12/27/15 1511  Tanna Furry, MD 12/27/15 1512  Tanna Furry, MD 12/27/15 [phone]

## 2015-12-15 NOTE — H&P (Signed)
Benjamin [name] is an 65 y.o. male.   Chief Complaint: Abd pain HPI: 65 y.o. M on chronic aspirin therapy for polycythemia vera presents to the ED with abd pain in the RUQ radiating to the R shoulder.  Associated with nausea and vomiting.  Pt denies any ibuprofen use but does take aspirin daily for P vera.  S/p recent surgery on his hand.  He is a non-smoker.    Past Medical History  Diagnosis Date  . P. vera ([place]) 08/19/2011  . Polycythemia   . Anemia   . DVT (deep venous thrombosis) ([name])     hx -lt leg-dx polycythemia  . Focal dystonia   . Clotting disorder [facility])     Past Surgical History  Procedure Laterality Date  . Wisdom tooth extraction    . Finger arthroplasty  1998    rt index  . Inguinal hernia repair  2007    left  . Inguinal hernia repair  2009    right  . Colonoscopy    . Dupuytren contracture release Left 08/08/2013    Procedure: DUPUYTREN CONTRACTURE RELEASE LEFT HAND;  Surgeon: Cammie Sickle., MD;  Location: [place];  Service: Orthopedics;  Laterality: Left;  . Colonoscopy    . Hernia repair      Family History  Problem Relation Age of Onset  . Colon cancer Neg Hx   . Heart disease Father   . Hyperlipidemia Father   . Hypertension Father   . Diabetes Father   . Diabetes Brother   . Hyperlipidemia Brother    Social History:  reports that he has never smoked. He has never used smokeless tobacco. He reports that he drinks alcohol. He reports that he does not use illicit drugs.  Allergies:  Allergies  Allergen Reactions  . Keflex [Cephalexin] Hives     (Not in a hospital admission)  Results for orders placed or performed during the hospital encounter of 12/14/15 (from the past 48 hour(s))  Lipase, blood     Status: None   Collection Time: 12/14/15  5:39 PM  Result Value Ref Range   Lipase 19 11 - 51 U/L  Comprehensive metabolic panel     Status: Abnormal   Collection Time: 12/14/15  5:39 PM  Result Value Ref Range   Sodium 136 135 - 145 mmol/L   Potassium 4.0 3.5 - 5.1 mmol/L   Chloride 100 (L) 101 - 111 mmol/L   CO2 23 22 - 32 mmol/L   Glucose, Bld 181 (H) 65 - 99 mg/dL   BUN 16 6 - 20 mg/dL   Creatinine, Ser 0.97 0.61 - 1.24 mg/dL   Calcium 9.7 8.9 - 10.3 mg/dL   Total Protein 7.2 6.5 - 8.1 g/dL   Albumin 4.6 3.5 - 5.0 g/dL   AST 25 15 - 41 U/L   ALT 24 17 - 63 U/L   Alkaline Phosphatase 999 (H) 38 - 126 U/L   Total Bilirubin 1.7 (H) 0.3 - 1.2 mg/dL   GFR calc non Af Amer >60 >60 mL/min   GFR calc Af Amer >60 >60 mL/min    Comment: (NOTE) The eGFR has been calculated using the CKD EPI equation. This calculation has not been validated in all clinical situations. eGFR's persistently <60 mL/min signify possible Chronic Kidney Disease.    Anion gap 13 5 - 15  CBC     Status: Abnormal   Collection Time: 12/14/15  5:39 PM  Result Value Ref Range  WBC 26.0 (H) 4.0 - 10.5 K/uL   RBC 8.14 (H) 4.22 - 5.81 MIL/uL    Comment: RESULTS CONFIRMED BY MANUAL DILUTION   Hemoglobin 16.7 13.0 - 17.0 g/dL   HCT 55.0 (H) 39.0 - 52.0 %   MCV 67.5 (L) 78.0 - 100.0 fL   MCH 20.5 (L) 26.0 - 34.0 pg   MCHC 30.4 30.0 - 36.0 g/dL   RDW 19.9 (H) 11.5 - 15.5 %   Platelets 360 150 - 400 K/uL  Urinalysis, Routine w reflex microscopic (not at [facility])     Status: Abnormal   Collection Time: 12/14/15  9:00 PM  Result Value Ref Range   Color, Urine ORANGE (A) YELLOW    Comment: BIOCHEMICALS MAY BE AFFECTED BY COLOR   APPearance CLEAR CLEAR   Specific Gravity, Urine 1.022 1.005 - 1.030   pH 5.5 5.0 - 8.0   Glucose, UA NEGATIVE NEGATIVE mg/dL   Hgb urine dipstick NEGATIVE NEGATIVE   Bilirubin Urine MODERATE (A) NEGATIVE   Ketones, ur 15 (A) NEGATIVE mg/dL   Protein, ur NEGATIVE NEGATIVE mg/dL   Nitrite NEGATIVE NEGATIVE   Leukocytes, UA SMALL (A) NEGATIVE  Urine microscopic-add on     Status: Abnormal   Collection Time: 12/14/15  9:00 PM  Result Value Ref Range   Squamous Epithelial / LPF 0-5 (A) NONE SEEN    WBC, UA 6-30 0 - 5 WBC/hpf   RBC / HPF 0-5 0 - 5 RBC/hpf   Bacteria, UA FEW (A) NONE SEEN  I-Stat CG4 Lactic Acid, ED     Status: None   Collection Time: 12/14/15  9:53 PM  Result Value Ref Range   Lactic Acid, Venous 1.97 0.5 - 2.0 mmol/L   US Abdomen Complete  12/15/2015  CLINICAL DATA:  Right upper quadrant pain for 1 day. EXAM: ABDOMEN ULTRASOUND COMPLETE COMPARISON:  Ultrasound 12/14/2014 FINDINGS: Gallbladder: Physiologically distended. The previous edematous gallbladder wall has resolved. Wall thickness currently normal at 3 mm. No sonographic Murphy sign noted by sonographer. Common bile duct: Diameter: 5.4 mm. Liver: No focal lesion identified. Within normal limits in parenchymal echogenicity. Normal directional flow in the main portal vein. IVC: No abnormality visualized. Pancreas: Visualized portion unremarkable. Spleen: Spleen is enlarged measuring 12.7 x 17.3 x 9.9 cm for a volume of 1137 cc. Right Kidney: Length: 11.2 cm. Echogenicity within normal limits. No mass or hydronephrosis visualized. Left Kidney: Length: 10.3 cm. Echogenicity within normal limits. No mass or hydronephrosis visualized. Abdominal aorta: No aneurysm visualized. Other findings: Small volume of ascites. IMPRESSION: 1. Splenomegaly. 2. Small volume intra-abdominal ascites. 3. Resolution of previous gallbladder wall thickening. Gallbladder appears normal. Electronically Signed   By: Jeb Levering M.D.   On: 12/15/2015 00:38   Ct Angio Abd/pel W/ And/or W/o  12/15/2015  CLINICAL DATA:  Right flank and right upper quadrant pain. Onset yesterday. Vomiting. EXAM: CTA ABDOMEN AND PELVIS wITHOUT AND WITH CONTRAST TECHNIQUE: Multidetector CT imaging of the abdomen and pelvis was performed using the standard protocol during bolus administration of intravenous contrast. Multiplanar reconstructed images and MIPs were obtained and reviewed to evaluate the vascular anatomy. CONTRAST:  100 cc Isovue 370 IV COMPARISON:  Abdominal  ultrasound earlier this day. FINDINGS: Lower chest: Small bilateral pleural effusions, right greater than left with adjacent atelectasis. Liver: No focal lesion. Hepatobiliary: Gallbladder physiologically distended, no calcified stone. No biliary dilatation. Pancreas: Normal. Spleen: Enlarged measuring 19.1 cm craniocaudal. Pre-pyloric gastric wall thickening. Adrenal glands: No nodule. Kidneys: Symmetric renal enhancement.  No  hydronephrosis. Stomach/Bowel: Stomach physiologically distended. There are no dilated or thickened small bowel loops. There is mild wall thickening involving the ascending colon. Small volume of stool throughout the remainder the colon. The appendix is normal. Aorta: Normal in caliber without dissection, aneurysm, or periaortic soft tissue stranding. No significant atherosclerosis. Celiac, superior mesenteric, and inferior mesenteric arteries are patent. Single bilateral renal arteries are patent. Both common and external iliac artery systems are widely patent. Vascular/Lymphatic: There is no mesenteric or portal venous gas. Splenic vein is prominent in size. There is questionably eccentric low-density within left greater than right external iliac and left common iliac veins. No retroperitoneal adenopathy. Abdominal aorta is normal in caliber. Reproductive: Prominent sized prostate gland. Bladder: Physiologically distended without wall thickening. Other: Moderate volume of free air and free fluid in the abdomen. Free air is seen anteriorly in the upper, mid, and lesser extent lower abdomen with air tracking along the undersurface of the liver and spleen. Small volume of free fluid throughout the abdomen and pelvis. No loculated abscess. Fat in the right inguinal canal. Musculoskeletal: There are no acute or suspicious osseous abnormalities. Review of the MIP images confirms the above findings. IMPRESSION: 1. Perforated viscus with free air and free fluid in the abdomen. Upper GI source is  strongly favored with gastric wall thickening in the pre pyloric region. 2. Splenomegaly, patient with history of polcythemia. 3. Questionable thrombus in left greater than right external and left common iliac veins, this appears eccentic. Unclear whether this represents DVT, likely chronic, versus venous mixing secondary to phase of contrast. Correlation with clinical history recommended, duplex evaluation of the lower extremities may be of value based on clinical grounds. Critical Value/emergent results were called by telephone at the time of interpretation on 12/15/2015 at 3:21 am to Dr. Stark Jock, who verbally acknowledged these results. Electronically Signed   By: Jeb Levering M.D.   On: 12/15/2015 03:28    Review of Systems  Constitutional: Negative for fever and chills.  Eyes: Negative for blurred vision.  Respiratory: Negative for cough and shortness of breath.   Cardiovascular: Negative for chest pain.  Gastrointestinal: Positive for nausea, vomiting and abdominal pain. Negative for heartburn, diarrhea, constipation and blood in stool.  Genitourinary: Negative for dysuria, urgency and frequency.  Neurological: Negative for headaches.    Blood pressure 121/67, pulse 92, temperature 98.2 F (36.8 C), temperature source Oral, resp. rate 17, SpO2 97 %. Physical Exam  Constitutional: He is oriented to person, place, and time. He appears well-developed and well-nourished. No distress.  HENT:  Head: Normocephalic.  Eyes: Conjunctivae and EOM are normal. Pupils are equal, round, and reactive to light.  Neck: Normal range of motion. Neck supple.  Cardiovascular: Normal rate and regular rhythm.   Respiratory: Effort normal and breath sounds normal.  GI: Soft. Bowel sounds are normal. He exhibits no distension. There is tenderness.  Upper abd tenderness to palpation  Musculoskeletal: Normal range of motion.  Neurological: He is alert and oriented to person, place, and time.  Skin: Skin is warm  and dry. He is not diaphoretic.     Assessment/Plan 65 y.o. M with perforated viscus.  CT scan appears to point towards an upper GI source.  Most likely perforated ulcer given his chronic NSAID use.  I have recommended an exploratory laparotomy to evaluate the source of this perforation.  If there is a perforated ulcer, we will plan on doing a graham patch.  He is at a higher risk for bleeding due  to his aspirin use.  We will prepare platelets and type and cross him for possible transfusion if needed.  Other risks of surgery include infection, leak of surgical repair, prolonged hospitalization and recurrence.  I believe he understands this and has agreed to proceed with this emergent procedure.    Rosario Adie., MD 0/05/711, 4:27 AM

## 2015-12-15 NOTE — Anesthesia Procedure Notes (Signed)
Procedure Name: Intubation Date/Time: 12/15/2015 7:02 AM Performed by: Lissa Morales Pre-anesthesia Checklist: Patient identified, Emergency Drugs available, Suction available and Patient being monitored Patient Re-evaluated:Patient Re-evaluated prior to inductionOxygen Delivery Method: Circle System Utilized Preoxygenation: Pre-oxygenation with 100% oxygen Intubation Type: IV induction Ventilation: Mask ventilation without difficulty Laryngoscope Size: Mac and 4 Grade View: Grade II Tube type: Oral Tube size: 8.0 mm Number of attempts: 1 Airway Equipment and Method: Stylet and Oral airway (rapid sequence  induction with cricoid) Placement Confirmation: ETT inserted through vocal cords under direct vision,  positive ETCO2 and breath sounds checked- equal and bilateral Secured at: 22 cm Tube secured with: Tape Dental Injury: Teeth and Oropharynx as per pre-operative assessment

## 2015-12-15 NOTE — ED Notes (Signed)
Please keep pt's spouse Prentiss Bells) updated at [phone]

## 2015-12-15 NOTE — Op Note (Addendum)
12/14/2015 - 12/15/2015  8:14 AM  PATIENT:  Benjamin [name]  65 y.o. male  Patient Care Team: Burnard Bunting, MD as PCP - General (Internal Medicine)  PRE-OPERATIVE DIAGNOSIS:  bowel perforation  POST-OPERATIVE DIAGNOSIS:  Perforated pre-pyloric ulcer  PROCEDURE:  Procedure(s): EXPLORATORY LAPAROTOMY with Silvestre Gunner Repair of Pre-pyloric Ulcer   Surgeon(s): Leighton Ruff, MD Alphonsa Overall, MD  ASSISTANT: Dr Lucia Gaskins   ANESTHESIA:   general  EBL:  Total I/O In: 1000 [I.V.:1000] Out: 100 [Blood:100]  DRAINS: (45F) Jackson-Pratt drain(s) with closed bulb suction in the upper abdomen   SPECIMEN:  No Specimen  DISPOSITION OF SPECIMEN:  N/A  COUNTS:  YES  PLAN OF CARE: Admit to inpatient   PATIENT DISPOSITION:  PACU - hemodynamically stable.  INDICATION: 65 year old male with polycythemia vera who presents to the hospital with acute abdominal pain and findings consistent with perforated ulcer. He takes chronic aspirin therapy for his polycythemia.   OR FINDINGS: Perforated prepyloric ulcer, ~49mm  DESCRIPTION: the patient was identified in the preoperative holding area and taken to the OR where they were laid supine on the operating room table.  General anesthesia was induced without difficulty. SCDs were also noted to be in place prior to the initiation of anesthesia.  A Foley catheter was inserted under sterile conditions. The patient was then prepped and draped in the usual sterile fashion.   A surgical timeout was performed indicating the correct patient, procedure, positioning and need for preoperative antibiotics.   A small upper midline incision was made using a scalpel. This was carried down through the subcutaneous tissues using electrocautery. The fascia was incised at midline. Upon bluntly opening the peritoneum, I encountered bile-stained purulence within the abdominal cavity.  This was suctioned out. I identified the gastric wall and was able to find the  perforation rather quickly at the anterior gastric side of the pylorus.  I placed 4 2-0 silk sutures to close the ulcer. A piece of omentum was closed over top of this using the same silk sutures. The abdomen was irrigated with approximately 5 L of warm normal saline. A 19 [place] Blake drain was placed at the area of the perforation. An NG tube was placed from above. The fascia was then close using 2 running PDS non-looped sutures.  The subcutaneous tissue and skin were left open and packed. A sterile dressing was applied. The patient tolerated this well and was sent to the post anesthesia care unit in stable condition.

## 2015-12-15 NOTE — Transfer of Care (Signed)
Immediate Anesthesia Transfer of Care Note  Patient: Benjamin [name]  Procedure(s) Performed: Procedure(s): EXPLORATORY LAPAROTOMY (N/A)  Patient Location: PACU  Anesthesia Type:General  Level of Consciousness: awake, alert , oriented and patient cooperative  Airway & Oxygen Therapy: Patient Spontanous Breathing and Patient connected to face mask oxygen  Post-op Assessment: Report given to RN, Post -op Vital signs reviewed and stable and Patient moving all extremities X 4  Post vital signs: stable  Last Vitals:  Filed Vitals:   12/15/15 0815 12/15/15 0820  BP:  143/72  Pulse:  106  Temp: 37.2 C 37.2 C  Resp:  28    Complications: No apparent anesthesia complications

## 2015-12-16 ENCOUNTER — Encounter (HOSPITAL_COMMUNITY): Payer: Self-pay | Admitting: General Surgery

## 2015-12-16 LAB — BASIC METABOLIC PANEL
Anion gap: 9 (ref 5–15)
BUN: 32 mg/dL — AB (ref 6–20)
CO2: 25 mmol/L (ref 22–32)
CREATININE: 1.28 mg/dL — AB (ref 0.61–1.24)
Calcium: 8.1 mg/dL — ABNORMAL LOW (ref 8.9–10.3)
Chloride: 107 mmol/L (ref 101–111)
GFR, EST NON AFRICAN AMERICAN: 57 mL/min — AB (ref >60)
Glucose, Bld: 129 mg/dL — ABNORMAL HIGH (ref 65–99)
Potassium: 4.5 mmol/L (ref 3.5–5.1)
SODIUM: 141 mmol/L (ref 135–145)

## 2015-12-16 LAB — CBC
HCT: 42 % (ref 39.0–52.0)
Hemoglobin: 12.5 g/dL — ABNORMAL LOW (ref 13.0–17.0)
MCH: 20.2 pg — ABNORMAL LOW (ref 26.0–34.0)
MCHC: 29.8 g/dL — ABNORMAL LOW (ref 30.0–36.0)
MCV: 68 fL — ABNORMAL LOW (ref 78.0–100.0)
PLATELETS: 258 10*3/uL (ref 150–400)
RBC: 6.18 MIL/uL — AB (ref 4.22–5.81)
RDW: 19.4 % — AB (ref 11.5–15.5)
WBC: 28.7 10*3/uL — AB (ref 4.0–10.5)

## 2015-12-16 LAB — PREPARE PLATELET PHERESIS: UNIT DIVISION: 0

## 2015-12-16 MED ORDER — DIPHENHYDRAMINE HCL 50 MG/ML IJ SOLN
25.0000 mg | Freq: Four times a day (QID) | INTRAMUSCULAR | Status: DC | PRN
  Administered 2015-12-16: 25 mg via INTRAVENOUS
  Filled 2015-12-16: qty 1

## 2015-12-16 MED ORDER — SODIUM CHLORIDE 0.9 % IV BOLUS (SEPSIS)
500.0000 mL | Freq: Once | INTRAVENOUS | Status: AC
  Administered 2015-12-16: 500 mL via INTRAVENOUS

## 2015-12-16 MED ORDER — HYDROMORPHONE HCL 1 MG/ML IJ SOLN
0.5000 mg | INTRAMUSCULAR | Status: DC | PRN
  Administered 2015-12-16: 0.5 mg via INTRAVENOUS
  Administered 2015-12-16 – 2015-12-17 (×2): 1 mg via INTRAVENOUS
  Filled 2015-12-16 (×3): qty 1

## 2015-12-16 MED ORDER — SODIUM CHLORIDE 0.9 % IV SOLN
500.0000 mg | Freq: Three times a day (TID) | INTRAVENOUS | Status: DC
  Administered 2015-12-16 – 2015-12-17 (×2): 500 mg via INTRAVENOUS
  Filled 2015-12-16 (×3): qty 500

## 2015-12-16 MED ORDER — DEXTROSE-NACL 5-0.9 % IV SOLN
INTRAVENOUS | Status: DC
  Administered 2015-12-16: 10:00:00 via INTRAVENOUS
  Administered 2015-12-16: 125 mL/h via INTRAVENOUS
  Administered 2015-12-17 – 2015-12-18 (×4): 1000 mL via INTRAVENOUS

## 2015-12-16 NOTE — Progress Notes (Signed)
Pharmacy Antibiotic Note  Benjamin [name] is a 65 y.o. male admitted on 12/14/2015 with perforated pre-pyloric ulcer s/p graham patch repair on 4/23.  Pharmacy has been consulted for Primaxin dosing.  Plan:  Primaxin 500mg  IV q8h.  Follow up renal fxn, culture results, and clinical course.   Height: 6' 0.5" (184.2 cm) Weight: 165 lb (74.844 kg) IBW/kg (Calculated) : 78.75  Temp (24hrs), Avg:98.3 F (36.8 C), Min:97.6 F (36.4 C), Max:98.8 F (37.1 C)   Recent Labs Lab 12/14/15 1739 12/14/15 2153 12/16/15 0417  WBC 26.0*  --  28.7*  CREATININE 0.97  --  1.28*  LATICACIDVEN  --  1.97  --     Estimated Creatinine Clearance: 60.1 mL/min (by C-G formula based on Cr of 1.28).    Allergies  Allergen Reactions  . Keflex [Cephalexin] Hives    Antimicrobials this admission: 4/23 Primaxin >>   Dose adjustments this admission: 4/24 Primaxin changed to 500mg  IV q8h  Microbiology results:  None  Thank you for allowing pharmacy to be a part of this patient's care.  Gretta Arab PharmD, BCPS Pager [phone] 12/16/2015 2:04 PM

## 2015-12-16 NOTE — Progress Notes (Addendum)
Benjamin Pian NP paged after noted patient had large whelps on his arms and legs.  Patient reports no itching at sites.  Patient has received Morphine this am along with Normal saline.  Await response from [phone] patient may have benadryl if needed per NP pt reports no itching at this time

## 2015-12-16 NOTE — Progress Notes (Signed)
Patient ID: Benjamin [name], male   DOB: 03-Feb-1951, 65 y.o.   MRN: [id number]     [facility]      [address] [place], [place] [address]    Phone: [phone] FAX: [phone]     Subjective: No nv.  No flatus. Walked x1 yesterday.  Afebrile.  VSS.  WBC up 28.7k.  H&h down but stable.  sCr 1.28.  263m drain output.   Objective:  Vital signs:  Filed Vitals:   12/15/15 1812 12/15/15 2215 12/16/15 0130 12/16/15 0419  BP: 125/59 130/37 134/63 126/54  Pulse: 98 101 92 95  Temp: 98.8 F (37.1 C) 98.5 F (36.9 C) 98.2 F (36.8 C) 98.6 F (37 C)  TempSrc: Axillary Oral Oral Oral  Resp: '20 18 18 18  ' Height:      Weight:      SpO2: 98% 98% 100% 99%       Intake/Output   Yesterday:  04/23 0701 - 04/24 0700 In: 5226.3 [I.V.:4806.3; NG/GT:20; IV Piggyback:400] Out: 1480 [Urine:775; Emesis/NG output:400; Drains:205; Blood:100] This shift:    Physical Exam: General: Pt awake/alert/oriented x4 in no acute distress Chest: cta.  No chest wall pain w good excursion CV:  Pulses intact.  Regular rhythm MS: Normal AROM mjr joints.  No obvious deformity Abdomen: Soft.  Nondistended. Incision is c/d/i.  jp drain with serosanguinous output.  No bowel sounds.  Mildly tender at incisions only.  No evidence of peritonitis.  No incarcerated hernias. Ext:  SCDs BLE.  No mjr edema.  No cyanosis Skin: No petechiae / purpura   Problem List:   Active Problems:   Perforated gastric ulcer ([place]    Results:   Labs: Results for orders placed or performed during the hospital encounter of 12/14/15 (from the past 48 hour(s))  Lipase, blood     Status: None   Collection Time: 12/14/15  5:39 PM  Result Value Ref Range   Lipase 19 11 - 51 U/L  Comprehensive metabolic panel     Status: Abnormal   Collection Time: 12/14/15  5:39 PM  Result Value Ref Range   Sodium 136 135 - 145 mmol/L   Potassium 4.0 3.5 - 5.1 mmol/L   Chloride 100 (L)  101 - 111 mmol/L   CO2 23 22 - 32 mmol/L   Glucose, Bld 181 (H) 65 - 99 mg/dL   BUN 16 6 - 20 mg/dL   Creatinine, Ser 0.97 0.61 - 1.24 mg/dL   Calcium 9.7 8.9 - 10.3 mg/dL   Total Protein 7.2 6.5 - 8.1 g/dL   Albumin 4.6 3.5 - 5.0 g/dL   AST 25 15 - 41 U/L   ALT 24 17 - 63 U/L   Alkaline Phosphatase 999 (H) 38 - 126 U/L   Total Bilirubin 1.7 (H) 0.3 - 1.2 mg/dL   GFR calc non Af Amer >60 >60 mL/min   GFR calc Af Amer >60 >60 mL/min    Comment: (NOTE) The eGFR has been calculated using the CKD EPI equation. This calculation has not been validated in all clinical situations. eGFR's persistently <60 mL/min signify possible Chronic Kidney Disease.    Anion gap 13 5 - 15  CBC     Status: Abnormal   Collection Time: 12/14/15  5:39 PM  Result Value Ref Range   WBC 26.0 (H) 4.0 - 10.5 K/uL   RBC 8.14 (H) 4.22 - 5.81 MIL/uL    Comment: RESULTS CONFIRMED BY MANUAL DILUTION  Hemoglobin 16.7 13.0 - 17.0 g/dL   HCT 55.0 (H) 39.0 - 52.0 %   MCV 67.5 (L) 78.0 - 100.0 fL   MCH 20.5 (L) 26.0 - 34.0 pg   MCHC 30.4 30.0 - 36.0 g/dL   RDW 19.9 (H) 11.5 - 15.5 %   Platelets 360 150 - 400 K/uL  Urinalysis, Routine w reflex microscopic (not at [facility])     Status: Abnormal   Collection Time: 12/14/15  9:00 PM  Result Value Ref Range   Color, Urine ORANGE (A) YELLOW    Comment: BIOCHEMICALS MAY BE AFFECTED BY COLOR   APPearance CLEAR CLEAR   Specific Gravity, Urine 1.022 1.005 - 1.030   pH 5.5 5.0 - 8.0   Glucose, UA NEGATIVE NEGATIVE mg/dL   Hgb urine dipstick NEGATIVE NEGATIVE   Bilirubin Urine MODERATE (A) NEGATIVE   Ketones, ur 15 (A) NEGATIVE mg/dL   Protein, ur NEGATIVE NEGATIVE mg/dL   Nitrite NEGATIVE NEGATIVE   Leukocytes, UA SMALL (A) NEGATIVE  Urine microscopic-add on     Status: Abnormal   Collection Time: 12/14/15  9:00 PM  Result Value Ref Range   Squamous Epithelial / LPF 0-5 (A) NONE SEEN   WBC, UA 6-30 0 - 5 WBC/hpf   RBC / HPF 0-5 0 - 5 RBC/hpf   Bacteria, UA FEW (A)  NONE SEEN  I-Stat CG4 Lactic Acid, ED     Status: None   Collection Time: 12/14/15  9:53 PM  Result Value Ref Range   Lactic Acid, Venous 1.97 0.5 - 2.0 mmol/L  ABO/Rh     Status: None   Collection Time: 12/15/15  4:32 AM  Result Value Ref Range   ABO/RH(D) B POS   Prepare Pheresed Platelets     Status: None   Collection Time: 12/15/15  4:32 AM  Result Value Ref Range   Unit Number [id number]    Blood Component Type PLTP LR1 PAS    Unit division 00    Status of Unit REL FROM [facility]    Transfusion Status OK TO TRANSFUSE   Basic metabolic panel     Status: Abnormal   Collection Time: 12/16/15  4:17 AM  Result Value Ref Range   Sodium 141 135 - 145 mmol/L   Potassium 4.5 3.5 - 5.1 mmol/L   Chloride 107 101 - 111 mmol/L   CO2 25 22 - 32 mmol/L   Glucose, Bld 129 (H) 65 - 99 mg/dL   BUN 32 (H) 6 - 20 mg/dL   Creatinine, Ser 1.28 (H) 0.61 - 1.24 mg/dL   Calcium 8.1 (L) 8.9 - 10.3 mg/dL   GFR calc non Af Amer 57 (L) >60 mL/min   GFR calc Af Amer >60 >60 mL/min    Comment: (NOTE) The eGFR has been calculated using the CKD EPI equation. This calculation has not been validated in all clinical situations. eGFR's persistently <60 mL/min signify possible Chronic Kidney Disease.    Anion gap 9 5 - 15  CBC     Status: Abnormal   Collection Time: 12/16/15  4:17 AM  Result Value Ref Range   WBC 28.7 (H) 4.0 - 10.5 K/uL   RBC 6.18 (H) 4.22 - 5.81 MIL/uL   Hemoglobin 12.5 (L) 13.0 - 17.0 g/dL    Comment: REPEATED TO VERIFY DELTA CHECK NOTED    HCT 42.0 39.0 - 52.0 %   MCV 68.0 (L) 78.0 - 100.0 fL   MCH 20.2 (L) 26.0 - 34.0 pg   MCHC  29.8 (L) 30.0 - 36.0 g/dL   RDW 19.4 (H) 11.5 - 15.5 %   Platelets 258 150 - 400 K/uL    Imaging / Studies: US Abdomen Complete  12/15/2015  CLINICAL DATA:  Right upper quadrant pain for 1 day. EXAM: ABDOMEN ULTRASOUND COMPLETE COMPARISON:  Ultrasound 12/14/2014 FINDINGS: Gallbladder: Physiologically distended. The previous edematous gallbladder  wall has resolved. Wall thickness currently normal at 3 mm. No sonographic Murphy sign noted by sonographer. Common bile duct: Diameter: 5.4 mm. Liver: No focal lesion identified. Within normal limits in parenchymal echogenicity. Normal directional flow in the main portal vein. IVC: No abnormality visualized. Pancreas: Visualized portion unremarkable. Spleen: Spleen is enlarged measuring 12.7 x 17.3 x 9.9 cm for a volume of 1137 cc. Right Kidney: Length: 11.2 cm. Echogenicity within normal limits. No mass or hydronephrosis visualized. Left Kidney: Length: 10.3 cm. Echogenicity within normal limits. No mass or hydronephrosis visualized. Abdominal aorta: No aneurysm visualized. Other findings: Small volume of ascites. IMPRESSION: 1. Splenomegaly. 2. Small volume intra-abdominal ascites. 3. Resolution of previous gallbladder wall thickening. Gallbladder appears normal. Electronically Signed   By: Jeb Levering M.D.   On: 12/15/2015 00:38   Ct Angio Abd/pel W/ And/or W/o  12/15/2015  CLINICAL DATA:  Right flank and right upper quadrant pain. Onset yesterday. Vomiting. EXAM: CTA ABDOMEN AND PELVIS wITHOUT AND WITH CONTRAST TECHNIQUE: Multidetector CT imaging of the abdomen and pelvis was performed using the standard protocol during bolus administration of intravenous contrast. Multiplanar reconstructed images and MIPs were obtained and reviewed to evaluate the vascular anatomy. CONTRAST:  100 cc Isovue 370 IV COMPARISON:  Abdominal ultrasound earlier this day. FINDINGS: Lower chest: Small bilateral pleural effusions, right greater than left with adjacent atelectasis. Liver: No focal lesion. Hepatobiliary: Gallbladder physiologically distended, no calcified stone. No biliary dilatation. Pancreas: Normal. Spleen: Enlarged measuring 19.1 cm craniocaudal. Pre-pyloric gastric wall thickening. Adrenal glands: No nodule. Kidneys: Symmetric renal enhancement.  No hydronephrosis. Stomach/Bowel: Stomach physiologically  distended. There are no dilated or thickened small bowel loops. There is mild wall thickening involving the ascending colon. Small volume of stool throughout the remainder the colon. The appendix is normal. Aorta: Normal in caliber without dissection, aneurysm, or periaortic soft tissue stranding. No significant atherosclerosis. Celiac, superior mesenteric, and inferior mesenteric arteries are patent. Single bilateral renal arteries are patent. Both common and external iliac artery systems are widely patent. Vascular/Lymphatic: There is no mesenteric or portal venous gas. Splenic vein is prominent in size. There is questionably eccentric low-density within left greater than right external iliac and left common iliac veins. No retroperitoneal adenopathy. Abdominal aorta is normal in caliber. Reproductive: Prominent sized prostate gland. Bladder: Physiologically distended without wall thickening. Other: Moderate volume of free air and free fluid in the abdomen. Free air is seen anteriorly in the upper, mid, and lesser extent lower abdomen with air tracking along the undersurface of the liver and spleen. Small volume of free fluid throughout the abdomen and pelvis. No loculated abscess. Fat in the right inguinal canal. Musculoskeletal: There are no acute or suspicious osseous abnormalities. Review of the MIP images confirms the above findings. IMPRESSION: 1. Perforated viscus with free air and free fluid in the abdomen. Upper GI source is strongly favored with gastric wall thickening in the pre pyloric region. 2. Splenomegaly, patient with history of polcythemia. 3. Questionable thrombus in left greater than right external and left common iliac veins, this appears eccentic. Unclear whether this represents DVT, likely chronic, versus venous mixing secondary to phase  of contrast. Correlation with clinical history recommended, duplex evaluation of the lower extremities may be of value based on clinical grounds. Critical  Value/emergent results were called by telephone at the time of interpretation on 12/15/2015 at 3:21 am to Dr. Stark Jock, who verbally acknowledged these results. Electronically Signed   By: Jeb Levering M.D.   On: 12/15/2015 03:28    Medications / Allergies:  Scheduled Meds: . antiseptic oral rinse  7 mL Mouth Rinse q12n4p  . chlorhexidine  15 mL Mouth Rinse BID  . enoxaparin (LOVENOX) injection  40 mg Subcutaneous Q24H  . imipenem-cilastatin  500 mg Intravenous Q6H  . pantoprazole (PROTONIX) IV  80 mg Intravenous Q12H  . sodium chloride  500 mL Intravenous Once   Continuous Infusions: . dextrose 5 % and 0.9% NaCl     PRN Meds:.morphine injection, ondansetron **OR** ondansetron (ZOFRAN) IV  Antibiotics: Anti-infectives    Start     Dose/Rate Route Frequency Ordered Stop   12/15/15 0600  imipenem-cilastatin (PRIMAXIN) 500 mg in sodium chloride 0.9 % 100 mL IVPB  Status:  Discontinued     500 mg 200 mL/hr over 30 Minutes Intravenous Every 8 hours 12/15/15 0405 12/15/15 0436   12/15/15 0445  imipenem-cilastatin (PRIMAXIN) 500 mg in sodium chloride 0.9 % 100 mL IVPB     500 mg 200 mL/hr over 30 Minutes Intravenous Every 6 hours 12/15/15 0437          Assessment/Plan POD#1 elap with graham patch repair of pre-pylori ulcer---Dr. Marcello Moores  -continue NGT, bowel rest, plan to study prior to NGT removal -mobilize -dc foley -IS -PPI  Mild AKI-give 500cc fluid bolus . AM labs.  VTE prophylaxis-scd, lovenox ID-primaxin D#1/5 PCV-per Dr. Marin Olp.  Hold ASA.  Dispo-ileus   Erby Pian, [facility] Pager [phone](7A-4:30P)  12/16/2015 9:48 AM

## 2015-12-17 LAB — BASIC METABOLIC PANEL
ANION GAP: 9 (ref 5–15)
BUN: 24 mg/dL — ABNORMAL HIGH (ref 6–20)
CALCIUM: 8.2 mg/dL — AB (ref 8.9–10.3)
CO2: 25 mmol/L (ref 22–32)
Chloride: 108 mmol/L (ref 101–111)
Creatinine, Ser: 1 mg/dL (ref 0.61–1.24)
Glucose, Bld: 150 mg/dL — ABNORMAL HIGH (ref 65–99)
POTASSIUM: 4.2 mmol/L (ref 3.5–5.1)
Sodium: 142 mmol/L (ref 135–145)

## 2015-12-17 LAB — CBC
HEMATOCRIT: 40.7 % (ref 39.0–52.0)
HEMOGLOBIN: 12.1 g/dL — AB (ref 13.0–17.0)
MCH: 19.9 pg — AB (ref 26.0–34.0)
MCHC: 29.7 g/dL — ABNORMAL LOW (ref 30.0–36.0)
MCV: 66.9 fL — ABNORMAL LOW (ref 78.0–100.0)
Platelets: 260 10*3/uL (ref 150–400)
RBC: 6.08 MIL/uL — AB (ref 4.22–5.81)
RDW: 19.1 % — ABNORMAL HIGH (ref 11.5–15.5)
WBC: 22.7 10*3/uL — ABNORMAL HIGH (ref 4.0–10.5)

## 2015-12-17 MED ORDER — SODIUM CHLORIDE 0.9 % IV SOLN
500.0000 mg | Freq: Four times a day (QID) | INTRAVENOUS | Status: DC
  Filled 2015-12-17: qty 500

## 2015-12-17 MED ORDER — CIPROFLOXACIN IN D5W 400 MG/200ML IV SOLN
400.0000 mg | Freq: Two times a day (BID) | INTRAVENOUS | Status: DC
  Administered 2015-12-17 – 2015-12-19 (×6): 400 mg via INTRAVENOUS
  Filled 2015-12-17 (×7): qty 200

## 2015-12-17 NOTE — Progress Notes (Signed)
Benjamin [name] is looking better. His full catheter came out. He still has NG tube in. He is out of bed. He walked yesterday.  His hematocrit is 40.7. With IV fluids, and rehydration, his hematocrit normalized.  He has had no fever. He's had no cough. He is using the incentive spirometer.  He's had no rashes. He then had a little bit of reaction to morphine that was given to him yesterday.  He now is on Primaxin.  On his exam, all his vital signs are stable. His temperature 99.5. Pulse is 104. Blood pressure is 133/66. His abdomen is soft. I still do not detect any bowel sounds. He has no fluid wave. Lungs are clear. Cardiac exam slightly tachycardic regular.Marland Kitchen  His white count is coming down nicely. It is 22.7. This probably is asked to close to his baseline white cell count secondary to the polycythemia.  I very much appreciate the great job that has been done by surgery. I still am somewhat dubious as to baby aspirin causing the ulcer but I really cannot think of any other etiology. He did take nonsteroidals for 3 days prior to the perforation. I would not think that this would be enough to cause an ulcer perforation.  We will continue to follow along and help out as necessary.  Laurey Arrow e  Acts 16:31

## 2015-12-17 NOTE — Progress Notes (Signed)
Patient ID: Benjamin [name], male   DOB: [birth year], 65 y.o.   MRN: [id number]     [facility]      [address] [place], [place] [address]    Phone: [phone] FAX: [phone]     Subjective: No flatus. 85m ngt output. Vss.  Afebrile. No dysuria. Wbc is trending down.  H&h are stable.  Pain is well controlled.  Appears he had a allergic reaction to morphine, tolerating dilaudid.   Objective:  Vital signs:  Filed Vitals:   12/16/15 1431 12/16/15 2151 12/17/15 0157 12/17/15 0630  BP: 131/53 139/60 139/56 133/66  Pulse: 86 100 95 104  Temp: 98 F (36.7 C) 98.6 F (37 C) 99.2 F (37.3 C) 99.5 F (37.5 C)  TempSrc: Oral Oral Oral Oral  Resp: _0 Height:      Weight:      SpO2: 100% 99% 99% 99%    Last BM Date: 12/14/15  Intake/Output   Yesterday:  04/24 0701 - 04/25 0700 In: 2037.5 [I.V.:1337.5; IV Piggyback:700] Out: 25701[Urine:1700; Emesis/NG output:800; Drains:240] This shift:  Total I/O In: -  Out: 535 [Emesis/NG output:500; Drains:35]  Physical Exam: General: Pt awake/alert/oriented x4 in no acute distress Chest: cta. No chest wall pain w good excursion CV: Pulses intact. Regular rhythm MS: Normal AROM mjr joints. No obvious deformity Abdomen: Soft. distended. Incision is clean, superficial, repacked.  jp drain with serous output. No bowel sounds. Mildly tender at incisions only. No evidence of peritonitis. No incarcerated hernias. Ext: SCDs BLE. No mjr edema. No cyanosis Skin: No petechiae / purpura    Problem List:   Active Problems:   Perforated gastric ulcer ([place]    Results:   Labs: Results for orders placed or performed during the hospital encounter of 12/14/15 (from the past 48 hour(s))  Basic metabolic panel     Status: Abnormal   Collection Time: 12/16/15  4:17 AM  Result Value Ref Range   Sodium 141 135 - 145 mmol/L   Potassium 4.5 3.5 - 5.1 mmol/L   Chloride  107 101 - 111 mmol/L   CO2 25 22 - 32 mmol/L   Glucose, Bld 129 (H) 65 - 99 mg/dL   BUN 32 (H) 6 - 20 mg/dL   Creatinine, Ser 1.28 (H) 0.61 - 1.24 mg/dL   Calcium 8.1 (L) 8.9 - 10.3 mg/dL   GFR calc non Af Amer 57 (L) >60 mL/min   GFR calc Af Amer >60 >60 mL/min    Comment: (NOTE) The eGFR has been calculated using the CKD EPI equation. This calculation has not been validated in all clinical situations. eGFR's persistently <60 mL/min signify possible Chronic Kidney Disease.    Anion gap 9 5 - 15  CBC     Status: Abnormal   Collection Time: 12/16/15  4:17 AM  Result Value Ref Range   WBC 28.7 (H) 4.0 - 10.5 K/uL   RBC 6.18 (H) 4.22 - 5.81 MIL/uL   Hemoglobin 12.5 (L) 13.0 - 17.0 g/dL    Comment: REPEATED TO VERIFY DELTA CHECK NOTED    HCT 42.0 39.0 - 52.0 %   MCV 68.0 (L) 78.0 - 100.0 fL   MCH 20.2 (L) 26.0 - 34.0 pg   MCHC 29.8 (L) 30.0 - 36.0 g/dL   RDW 19.4 (H) 11.5 - 15.5 %   Platelets 258 150 - 400 K/uL  Basic metabolic panel     Status: Abnormal  Collection Time: 12/17/15  4:25 AM  Result Value Ref Range   Sodium 142 135 - 145 mmol/L   Potassium 4.2 3.5 - 5.1 mmol/L   Chloride 108 101 - 111 mmol/L   CO2 25 22 - 32 mmol/L   Glucose, Bld 150 (H) 65 - 99 mg/dL   BUN 24 (H) 6 - 20 mg/dL   Creatinine, Ser 1.00 0.61 - 1.24 mg/dL   Calcium 8.2 (L) 8.9 - 10.3 mg/dL   GFR calc non Af Amer >60 >60 mL/min   GFR calc Af Amer >60 >60 mL/min    Comment: (NOTE) The eGFR has been calculated using the CKD EPI equation. This calculation has not been validated in all clinical situations. eGFR's persistently <60 mL/min signify possible Chronic Kidney Disease.    Anion gap 9 5 - 15  CBC     Status: Abnormal   Collection Time: 12/17/15  4:25 AM  Result Value Ref Range   WBC 22.7 (H) 4.0 - 10.5 K/uL   RBC 6.08 (H) 4.22 - 5.81 MIL/uL   Hemoglobin 12.1 (L) 13.0 - 17.0 g/dL   HCT 40.7 39.0 - 52.0 %   MCV 66.9 (L) 78.0 - 100.0 fL   MCH 19.9 (L) 26.0 - 34.0 pg   MCHC 29.7 (L)  30.0 - 36.0 g/dL   RDW 19.1 (H) 11.5 - 15.5 %   Platelets 260 150 - 400 K/uL    Comment: REPEATED TO VERIFY    Imaging / Studies: No results found.  Medications / Allergies:  Scheduled Meds: . antiseptic oral rinse  7 mL Mouth Rinse q12n4p  . chlorhexidine  15 mL Mouth Rinse BID  . enoxaparin (LOVENOX) injection  40 mg Subcutaneous Q24H  . imipenem-cilastatin  500 mg Intravenous Q8H  . pantoprazole (PROTONIX) IV  80 mg Intravenous Q12H   Continuous Infusions: . dextrose 5 % and 0.9% NaCl 125 mL/hr (12/16/15 2134)   PRN Meds:.diphenhydrAMINE, HYDROmorphone (DILAUDID) injection, ondansetron **OR** ondansetron (ZOFRAN) IV  Antibiotics: Anti-infectives    Start     Dose/Rate Route Frequency Ordered Stop   12/16/15 2200  imipenem-cilastatin (PRIMAXIN) 500 mg in sodium chloride 0.9 % 100 mL IVPB     500 mg 200 mL/hr over 30 Minutes Intravenous Every 8 hours 12/16/15 1403     12/15/15 0600  imipenem-cilastatin (PRIMAXIN) 500 mg in sodium chloride 0.9 % 100 mL IVPB  Status:  Discontinued     500 mg 200 mL/hr over 30 Minutes Intravenous Every 8 hours 12/15/15 0405 12/15/15 0436   12/15/15 0445  imipenem-cilastatin (PRIMAXIN) 500 mg in sodium chloride 0.9 % 100 mL IVPB  Status:  Discontinued     500 mg 200 mL/hr over 30 Minutes Intravenous Every 6 hours 12/15/15 0437 12/16/15 1403        Assessment/Plan POD#2 elap with graham patch repair of pre-pylori ulcer---Dr. Marcello Moores  -continue NGT, bowel rest and await bowel function.  Will determine clinically if study is needed -BID wet to dry dressing changes -mobilize and IS -PPI  -Dr. Marin Olp does not have a high suspicious the ASA caused the perforation.  Will check h pylori to complete work up.  Mild AKI-resolved   VTE prophylaxis-scd, lovenox ID-primaxin D#2/5 PCV-per Dr. Marin Olp. Hold ASA.  Dispo-ileus   Erby Pian, [facility] Pager [phone](7A-4:30P)   12/17/2015 10:44 AM

## 2015-12-17 NOTE — Progress Notes (Signed)
Pharmacy Antibiotic Note  Benjamin [name] is a 65 y.o. male admitted on 12/14/2015 with perforated pre-pyloric ulcer s/p graham patch repair on 4/23.  Pharmacy has been consulted for Primaxin dosing.  Plan:  Primaxin 500mg  IV back to q6h, SCr bumped up 4/24, now back to 1.0  Follow up renal fxn and clinical course.  Surgery note: plan 5 days abx.   Height: 6' 0.5" (184.2 cm) Weight: 165 lb (74.844 kg) IBW/kg (Calculated) : 78.75  Temp (24hrs), Avg:98.8 F (37.1 C), Min:98 F (36.7 C), Max:99.5 F (37.5 C)   Recent Labs Lab 12/14/15 1739 12/14/15 2153 12/16/15 0417 12/17/15 0425  WBC 26.0*  --  28.7* 22.7*  CREATININE 0.97  --  1.28* 1.00  LATICACIDVEN  --  1.97  --   --     Estimated Creatinine Clearance: 76.9 mL/min (by C-G formula based on Cr of 1).    Allergies  Allergen Reactions  . Keflex [Cephalexin] Hives  . Morphine And Related Hives   Antimicrobials this admission: 4/23 Primaxin >>   Dose adjustments this admission: 4/24 Primaxin changed to 500mg  IV q8h 4/25 Primaxin back to 500mg  q6 (SCr 1.0)  Microbiology results:  None  Thank you for allowing pharmacy to be a part of this patient's care.

## 2015-12-18 LAB — BASIC METABOLIC PANEL
ANION GAP: 8 (ref 5–15)
BUN: 19 mg/dL (ref 6–20)
CALCIUM: 8.2 mg/dL — AB (ref 8.9–10.3)
CO2: 25 mmol/L (ref 22–32)
Chloride: 109 mmol/L (ref 101–111)
Creatinine, Ser: 0.89 mg/dL (ref 0.61–1.24)
GFR calc Af Amer: >60 mL/min (ref >60)
Glucose, Bld: 140 mg/dL — ABNORMAL HIGH (ref 65–99)
POTASSIUM: 3.6 mmol/L (ref 3.5–5.1)
SODIUM: 142 mmol/L (ref 135–145)

## 2015-12-18 LAB — CBC
HEMATOCRIT: 42.4 % (ref 39.0–52.0)
Hemoglobin: 12.4 g/dL — ABNORMAL LOW (ref 13.0–17.0)
MCH: 19.9 pg — ABNORMAL LOW (ref 26.0–34.0)
MCHC: 29.2 g/dL — ABNORMAL LOW (ref 30.0–36.0)
MCV: 68.1 fL — ABNORMAL LOW (ref 78.0–100.0)
Platelets: 277 10*3/uL (ref 150–400)
RBC: 6.23 MIL/uL — ABNORMAL HIGH (ref 4.22–5.81)
RDW: 19.2 % — AB (ref 11.5–15.5)
WBC: 21.1 10*3/uL — AB (ref 4.0–10.5)

## 2015-12-18 LAB — H. PYLORI ANTIBODY, IGG: H PYLORI IGG: 5 U/mL — AB (ref 0.0–0.8)

## 2015-12-18 NOTE — Progress Notes (Signed)
Patient ID: Benjamin [name], male   DOB: May 11, 1951, 65 y.o.   MRN: [id number]     [facility]      [address] [place], [place] [address]    Phone: [phone] FAX: [phone]     Subjective: Had a BM, ambulating, pain controlled. Afebrile.  Normal renal function, h&h are stable.   974m ngt output. 3066mdrain output, serous.   Objective:  Vital signs:  Filed Vitals:   12/17/15 0630 12/17/15 1400 12/17/15 2225 12/18/15 0559  BP: 133/66 135/58 140/65 138/63  Pulse: 104 94 93 90  Temp: 99.5 F (37.5 C) 98 F (36.7 C) 98.2 F (36.8 C) 98.5 F (36.9 C)  TempSrc: Oral Oral Oral Oral  Resp: _0 Height:      Weight:      SpO2: 99% 99% 98% 99%    Last BM Date: 12/18/15  Intake/Output   Yesterday:  04/25 0701 - 04/26 0700 In: 1793.7 [I.V.:1793.7] Out: 1680 [Urine:450; Emesis/NG output:925; Drains:305] This shift: I/O last 3 completed shifts: In: 1793.7 [I.V.:1793.7] Out: 3300 [Urine:1650; Emesis/NG output:1125; Drains:525]    Physical Exam: General: Pt awake/alert/oriented x4 in no acute distress Chest: cta. No chest wall pain w good excursion CV: Pulses intact. Regular rhythm MS: Normal AROM mjr joints. No obvious deformity Abdomen: Soft. Incision is c/d/i.  Mildly tender at incisions only. No evidence of peritonitis. No incarcerated hernias. Drain with serous output.  Ext: SCDs BLE. No mjr edema. No cyanosis Skin: No petechiae / purpura     Problem List:   Active Problems:   Perforated gastric ulcer ([place]   Results:   Labs: Results for orders placed or performed during the hospital encounter of 12/14/15 (from the past 48 hour(s))  Basic metabolic panel     Status: Abnormal   Collection Time: 12/17/15  4:25 AM  Result Value Ref Range   Sodium 142 135 - 145 mmol/L   Potassium 4.2 3.5 - 5.1 mmol/L   Chloride 108 101 - 111 mmol/L   CO2 25 22 - 32 mmol/L   Glucose, Bld 150 (H)  65 - 99 mg/dL   BUN 24 (H) 6 - 20 mg/dL   Creatinine, Ser 1.00 0.61 - 1.24 mg/dL   Calcium 8.2 (L) 8.9 - 10.3 mg/dL   GFR calc non Af Amer >60 >60 mL/min   GFR calc Af Amer >60 >60 mL/min    Comment: (NOTE) The eGFR has been calculated using the CKD EPI equation. This calculation has not been validated in all clinical situations. eGFR's persistently <60 mL/min signify possible Chronic Kidney Disease.    Anion gap 9 5 - 15  CBC     Status: Abnormal   Collection Time: 12/17/15  4:25 AM  Result Value Ref Range   WBC 22.7 (H) 4.0 - 10.5 K/uL   RBC 6.08 (H) 4.22 - 5.81 MIL/uL   Hemoglobin 12.1 (L) 13.0 - 17.0 g/dL   HCT 40.7 39.0 - 52.0 %   MCV 66.9 (L) 78.0 - 100.0 fL   MCH 19.9 (L) 26.0 - 34.0 pg   MCHC 29.7 (L) 30.0 - 36.0 g/dL   RDW 19.1 (H) 11.5 - 15.5 %   Platelets 260 150 - 400 K/uL    Comment: REPEATED TO VERIFY  H. pylori antibody, IgG     Status: Abnormal   Collection Time: 12/17/15 10:50 AM  Result Value Ref Range   H Pylori IgG 5.0 (H)  0.0 - 0.8 U/mL    Comment: (NOTE)                             Negative            <0.9                             Indeterminate  0.9 - 1.0                             Positive            >1.0 Performed At: [facility] [place], [place] [id number] Lindon Romp MD FY:[id number]   Basic metabolic panel     Status: Abnormal   Collection Time: 12/18/15  4:43 AM  Result Value Ref Range   Sodium 142 135 - 145 mmol/L   Potassium 3.6 3.5 - 5.1 mmol/L   Chloride 109 101 - 111 mmol/L   CO2 25 22 - 32 mmol/L   Glucose, Bld 140 (H) 65 - 99 mg/dL   BUN 19 6 - 20 mg/dL   Creatinine, Ser 0.89 0.61 - 1.24 mg/dL   Calcium 8.2 (L) 8.9 - 10.3 mg/dL   GFR calc non Af Amer >60 >60 mL/min   GFR calc Af Amer >60 >60 mL/min    Comment: (NOTE) The eGFR has been calculated using the CKD EPI equation. This calculation has not been validated in all clinical situations. eGFR's persistently <60 mL/min signify possible Chronic  Kidney Disease.    Anion gap 8 5 - 15  CBC     Status: Abnormal   Collection Time: 12/18/15  4:43 AM  Result Value Ref Range   WBC 21.1 (H) 4.0 - 10.5 K/uL   RBC 6.23 (H) 4.22 - 5.81 MIL/uL   Hemoglobin 12.4 (L) 13.0 - 17.0 g/dL   HCT 42.4 39.0 - 52.0 %   MCV 68.1 (L) 78.0 - 100.0 fL   MCH 19.9 (L) 26.0 - 34.0 pg   MCHC 29.2 (L) 30.0 - 36.0 g/dL   RDW 19.2 (H) 11.5 - 15.5 %   Platelets 277 150 - 400 K/uL    Imaging / Studies: No results found.  Medications / Allergies:  Scheduled Meds: . antiseptic oral rinse  7 mL Mouth Rinse q12n4p  . chlorhexidine  15 mL Mouth Rinse BID  . ciprofloxacin  400 mg Intravenous Q12H  . enoxaparin (LOVENOX) injection  40 mg Subcutaneous Q24H  . pantoprazole (PROTONIX) IV  80 mg Intravenous Q12H   Continuous Infusions: . dextrose 5 % and 0.9% NaCl 1,000 mL (12/18/15 0414)   PRN Meds:.diphenhydrAMINE, HYDROmorphone (DILAUDID) injection, ondansetron **OR** ondansetron (ZOFRAN) IV  Antibiotics: Anti-infectives    Start     Dose/Rate Route Frequency Ordered Stop   12/17/15 1400  imipenem-cilastatin (PRIMAXIN) 500 mg in sodium chloride 0.9 % 100 mL IVPB  Status:  Discontinued     500 mg 200 mL/hr over 30 Minutes Intravenous Every 6 hours 12/17/15 1111 12/17/15 1139   12/17/15 1200  ciprofloxacin (CIPRO) IVPB 400 mg     400 mg 200 mL/hr over 60 Minutes Intravenous Every 12 hours 12/17/15 1139     12/16/15 2200  imipenem-cilastatin (PRIMAXIN) 500 mg in sodium chloride 0.9 % 100 mL IVPB  Status:  Discontinued     500 mg 200 mL/hr over  30 Minutes Intravenous Every 8 hours 12/16/15 1403 12/17/15 1111   12/15/15 0600  imipenem-cilastatin (PRIMAXIN) 500 mg in sodium chloride 0.9 % 100 mL IVPB  Status:  Discontinued     500 mg 200 mL/hr over 30 Minutes Intravenous Every 8 hours 12/15/15 0405 12/15/15 0436   12/15/15 0445  imipenem-cilastatin (PRIMAXIN) 500 mg in sodium chloride 0.9 % 100 mL IVPB  Status:  Discontinued     500 mg 200 mL/hr over 30  Minutes Intravenous Every 6 hours 12/15/15 0437 12/16/15 1403       Assessment/Plan POD#3 elap with graham patch repair of pre-pylori ulcer---Dr. Marcello Moores  -clamp ngt and allow for clears.  If tolerates clears, dc ngt later today -continue drain -BID wet to dry dressing changes -mobilize and IS -PPI  H pylori-Rx for clarithomycin/amoxil x14D and PPI at discharge Mild AKI-resolved  VTE prophylaxis-scd, lovenox ID-cipro 3/5 PCV-per Dr. Marin Olp. Hold ASA.  Dispo-ileus  Erby Pian, [facility] Surgery Pager [phone](7A-4:30P)   12/18/2015 10:05 AM

## 2015-12-18 NOTE — Progress Notes (Signed)
Gastric residual amounting to 50cc. Patient denies n/v. Had 2small bowel movements this shift, passing gas. Tolerated clear liquids. Out of bed in the recliner chair. NGT intact, flushed with air,clamped. Will continue to monitor.

## 2015-12-19 MED ORDER — HYDROCODONE-ACETAMINOPHEN 5-325 MG PO TABS
1.0000 | ORAL_TABLET | Freq: Four times a day (QID) | ORAL | Status: DC | PRN
  Administered 2015-12-20: 1 via ORAL
  Filled 2015-12-19: qty 1

## 2015-12-19 NOTE — Progress Notes (Signed)
Patient ID: Benjamin [name], male   DOB: 09/29/1950, 65 y.o.   MRN: [id number]     [facility]      [address] [place], [place] [address]    Phone: [phone] FAX: [phone]     Subjective: 339m serous output from drain.  No pain.  Ambulating.  Tolerated clears.  Afebrile.  VSS.    Objective:  Vital signs:  Filed Vitals:   12/18/15 0559 12/18/15 1400 12/18/15 2111 12/19/15 0518  BP: 138/63 157/63 134/56 156/65  Pulse: 90 93 92 102  Temp: 98.5 F (36.9 C) 98.4 F (36.9 C) 98.4 F (36.9 C) 99 F (37.2 C)  TempSrc: Oral Oral Oral Axillary  Resp: _0 Height:      Weight:      SpO2: 99% 100% 99% 100%    Last BM Date: 12/19/15  Intake/Output   Yesterday:  04/26 0701 - 04/27 0700 In: 3919.5 [P.O.:660; I.V.:2959.5; IV Piggyback:300] Out: 1320 [Urine:850; Emesis/NG output:90; Drains:378; Stool:2] This shift: I/O last 3 completed shifts: In: 4713.2 [P.O.:660; I.V.:3753.2; IV PSUPJSRPRX:458]Out: 2075 [Urine:1000; Emesis/NG output:515; Drains:558; Stool:2]      Physical Exam: General: Pt awake/alert/oriented x4 in no acute distress Abdomen: Soft. Incision is c/d/i.  Mildly tender at incisions only. No evidence of peritonitis. No incarcerated hernias. Drain with serous output.    Problem List:   Active Problems:   Perforated gastric ulcer ([place]    Results:   Labs: Results for orders placed or performed during the hospital encounter of 12/14/15 (from the past 48 hour(s))  H. pylori antibody, IgG     Status: Abnormal   Collection Time: 12/17/15 10:50 AM  Result Value Ref Range   H Pylori IgG 5.0 (H) 0.0 - 0.8 U/mL    Comment: (NOTE)                             Negative            <0.9                             Indeterminate  0.9 - 1.0                             Positive            >1.0 [facility] [id number] Romp[place]  PMM:[id number]  Basic metabolic panel     Status: Abnormal   Collection Time: 12/18/15  4:43 AM  Result Value Ref Range   Sodium 142 135 - 145 mmol/L   Potassium 3.6 3.5 - 5.1 mmol/L   Chloride 109 101 - 111 mmol/L   CO2 25 22 - 32 mmol/L   Glucose, Bld 140 (H) 65 - 99 mg/dL   BUN 19 6 - 20 mg/dL   Creatinine, Ser 0.89 0.61 - 1.24 mg/dL   Calcium 8.2 (L) 8.9 - 10.3 mg/dL   GFR calc non Af Amer >60 >60 mL/min   GFR calc Af Amer >60 >60 mL/min    Comment: (NOTE) The eGFR has been calculated using the CKD EPI equation. This calculation has not been validated in all clinical situations. eGFR's persistently <60 mL/min signify possible Chronic Kidney Disease.    Anion gap 8 5 -  15  CBC     Status: Abnormal   Collection Time: 12/18/15  4:43 AM  Result Value Ref Range   WBC 21.1 (H) 4.0 - 10.5 K/uL   RBC 6.23 (H) 4.22 - 5.81 MIL/uL   Hemoglobin 12.4 (L) 13.0 - 17.0 g/dL   HCT 42.4 39.0 - 52.0 %   MCV 68.1 (L) 78.0 - 100.0 fL   MCH 19.9 (L) 26.0 - 34.0 pg   MCHC 29.2 (L) 30.0 - 36.0 g/dL   RDW 19.2 (H) 11.5 - 15.5 %   Platelets 277 150 - 400 K/uL    Imaging / Studies: No results found.  Medications / Allergies:  Scheduled Meds: . antiseptic oral rinse  7 mL Mouth Rinse q12n4p  . chlorhexidine  15 mL Mouth Rinse BID  . ciprofloxacin  400 mg Intravenous Q12H  . enoxaparin (LOVENOX) injection  40 mg Subcutaneous Q24H  . pantoprazole (PROTONIX) IV  80 mg Intravenous Q12H   Continuous Infusions: . dextrose 5 % and 0.9% NaCl 1,000 mL (12/18/15 2105)   PRN Meds:.diphenhydrAMINE, HYDROcodone-acetaminophen, HYDROmorphone (DILAUDID) injection, ondansetron **OR** ondansetron (ZOFRAN) IV  Antibiotics: Anti-infectives    Start     Dose/Rate Route Frequency Ordered Stop   12/17/15 1400  imipenem-cilastatin (PRIMAXIN) 500 mg in sodium chloride 0.9 % 100 mL IVPB  Status:  Discontinued     500 mg 200 mL/hr over 30 Minutes Intravenous Every 6 hours 12/17/15 1111 12/17/15 1139    12/17/15 1200  ciprofloxacin (CIPRO) IVPB 400 mg     400 mg 200 mL/hr over 60 Minutes Intravenous Every 12 hours 12/17/15 1139     12/16/15 2200  imipenem-cilastatin (PRIMAXIN) 500 mg in sodium chloride 0.9 % 100 mL IVPB  Status:  Discontinued     500 mg 200 mL/hr over 30 Minutes Intravenous Every 8 hours 12/16/15 1403 12/17/15 1111   12/15/15 0600  imipenem-cilastatin (PRIMAXIN) 500 mg in sodium chloride 0.9 % 100 mL IVPB  Status:  Discontinued     500 mg 200 mL/hr over 30 Minutes Intravenous Every 8 hours 12/15/15 0405 12/15/15 0436   12/15/15 0445  imipenem-cilastatin (PRIMAXIN) 500 mg in sodium chloride 0.9 % 100 mL IVPB  Status:  Discontinued     500 mg 200 mL/hr over 30 Minutes Intravenous Every 6 hours 12/15/15 0437 12/16/15 1403       Assessment/Plan POD#3 elap with graham patch repair of pre-pylori ulcer---Dr. Marcello Moores  -DC NGT and advance to fulls, solids this evening if tolerated  -continue drain -BID wet to dry dressing changes -mobilize and IS -PPI  H pylori-Rx for clarithomycin/amoxil x14D and PPI at discharge Mild AKI-resolved  VTE prophylaxis-scd, lovenox ID-cipro 4/5 PCV-per Dr. Marin Olp. Hold ASA.  Dispo-DC in AM.  [facility] for dressing changes    Erby Pian, [facility] Surgery Pager [phone](7A-4:30P)   12/19/2015 9:25 AM

## 2015-12-19 NOTE — Progress Notes (Signed)
Patient had a good amount of bowel movement early tonight,states passing gas  with it.. Tolerating his clear liquids and popsicle,no n/v or feeling of bloatedness. We will continue to monitor

## 2015-12-19 NOTE — Care Management Note (Signed)
Case Management Note  Patient Details  Name: Benjamin [name] MRN: [id number] Date of Birth: Nov 10, 1950  Subjective/Objective:                  bowel perforation Action/Plan: Discharge planning Expected Discharge Date:  12/20/15               Expected Discharge Plan:  [place]  In-House Referral:     Discharge planning Services  CM Consult  Post Acute Care Choice:  Home Health Choice offered to:  Patient  DME Arranged:  3-N-1 DME Agency:  [place]:  RN [facility] Agency:  [place]  Status of Service:  Completed, signed off  Medicare Important Message Given:    Date Medicare IM Given:    Medicare IM give by:    Date Additional Medicare IM Given:    Additional Medicare Important Message give by:     If discussed at [facility] of Stay Meetings, dates discussed:    Additional Comments: CM met with pt in room to offer choice of home health agency.  Pt chooses [facility] to render [facility] for dressing changes.  Referral called to [facility] rep, Manuela Schwartz.  CM called [facility] DME rep, Merry Proud to please deliver the 3n1 to the room prior to discharge.  No other CM needs were communicated. Dellie Catholic, RN 12/19/2015, 1:59 PM

## 2015-12-20 LAB — C DIFFICILE QUICK SCREEN W PCR REFLEX
C DIFFICILE (CDIFF) TOXIN: NEGATIVE
C DIFFICLE (CDIFF) ANTIGEN: NEGATIVE
C Diff interpretation: NEGATIVE

## 2015-12-20 LAB — CBC
HCT: 41.1 % (ref 39.0–52.0)
HEMOGLOBIN: 12.4 g/dL — AB (ref 13.0–17.0)
MCH: 20.1 pg — AB (ref 26.0–34.0)
MCHC: 30.2 g/dL (ref 30.0–36.0)
MCV: 66.7 fL — AB (ref 78.0–100.0)
Platelets: 250 10*3/uL (ref 150–400)
RBC: 6.16 MIL/uL — AB (ref 4.22–5.81)
RDW: 19.1 % — AB (ref 11.5–15.5)
WBC: 24.6 10*3/uL — AB (ref 4.0–10.5)

## 2015-12-20 MED ORDER — PANTOPRAZOLE SODIUM 40 MG PO TBEC: 40.0000 mg | DELAYED_RELEASE_TABLET | Freq: Two times a day (BID) | ORAL | Status: DC

## 2015-12-20 MED ORDER — CLARITHROMYCIN 500 MG PO TABS: 500.0000 mg | ORAL_TABLET | Freq: Two times a day (BID) | ORAL | Status: DC

## 2015-12-20 MED ORDER — METRONIDAZOLE 500 MG PO TABS: 500.0000 mg | ORAL_TABLET | Freq: Two times a day (BID) | ORAL | Status: DC

## 2015-12-20 MED ORDER — SODIUM CHLORIDE 0.9% FLUSH: 3.0000 mL | INTRAVENOUS | Status: DC | PRN

## 2015-12-20 MED ORDER — HYDROCODONE-ACETAMINOPHEN 5-325 MG PO TABS: 1.0000 | ORAL_TABLET | Freq: Four times a day (QID) | ORAL | Status: DC | PRN

## 2015-12-20 MED ORDER — SODIUM CHLORIDE 0.9% FLUSH: 3.0000 mL | Freq: Two times a day (BID) | INTRAVENOUS | Status: DC

## 2015-12-20 NOTE — Progress Notes (Signed)
Patient ID: Benjamin [name], male   DOB: August 12, 1951, 65 y.o.   MRN: [id number]     [facility]      [address] [place], [place] [address]    Phone: [phone] FAX: [phone]     Subjective: 35ml drain output, serous.  WBC 24.6k, normal 18-20k. Afebrile.  Having diarrhea 6 times just overnight.  No pain at all. Voiding.  Ambulating.  Tolerated liquids.   Objective:  Vital signs:  Filed Vitals:   12/19/15 0518 12/19/15 1425 12/19/15 2128 12/20/15 0546  BP: 156/65 146/56 150/58 130/50  Pulse: 102 93 91 85  Temp: 99 F (37.2 C) 98.1 F (36.7 C) 98.7 F (37.1 C) 98.3 F (36.8 C)  TempSrc: Axillary Oral Oral Oral  Resp: 18 18 18 16   Height:      Weight:      SpO2: 100% 100% 98% 98%    Last BM Date: 12/19/15  Intake/Output   Yesterday:  04/27 0701 - 04/28 0700 In: 1000 [I.V.:1000] Out: 395 [Drains:395] This shift:    I/O last 3 completed shifts: In: 3167.5 [P.O.:180; I.V.:2987.5] Out: 765 [Urine:150; Drains:615]    Physical Exam: General: Pt awake/alert/oriented x4 in no acute distress Abdomen: Soft. Incision is c/d/i, fascia intact, packing replaced. Mildly tender at incisions only. No evidence of peritonitis. No incarcerated hernias. Drain with serous output.      Problem List:   Active Problems:   Perforated gastric ulcer ([place])    Results:   Labs: Results for orders placed or performed during the hospital encounter of 12/14/15 (from the past 48 hour(s))  CBC     Status: Abnormal   Collection Time: 12/20/15  3:48 AM  Result Value Ref Range   WBC 24.6 (H) 4.0 - 10.5 K/uL   RBC 6.16 (H) 4.22 - 5.81 MIL/uL   Hemoglobin 12.4 (L) 13.0 - 17.0 g/dL   HCT 41.1 39.0 - 52.0 %   MCV 66.7 (L) 78.0 - 100.0 fL   MCH 20.1 (L) 26.0 - 34.0 pg   MCHC 30.2 30.0 - 36.0 g/dL   RDW 19.1 (H) 11.5 - 15.5 %   Platelets 250 150 - 400 K/uL    Comment: REPEATED TO VERIFY SPECIMEN CHECKED FOR CLOTS     Imaging  / Studies: No results found.  Medications / Allergies:  Scheduled Meds: . antiseptic oral rinse  7 mL Mouth Rinse q12n4p  . chlorhexidine  15 mL Mouth Rinse BID  . ciprofloxacin  400 mg Intravenous Q12H  . enoxaparin (LOVENOX) injection  40 mg Subcutaneous Q24H  . pantoprazole (PROTONIX) IV  80 mg Intravenous Q12H   Continuous Infusions: . dextrose 5 % and 0.9% NaCl 1,000 mL (12/18/15 2105)   PRN Meds:.diphenhydrAMINE, HYDROcodone-acetaminophen, HYDROmorphone (DILAUDID) injection, ondansetron **OR** ondansetron (ZOFRAN) IV  Antibiotics: Anti-infectives    Start     Dose/Rate Route Frequency Ordered Stop   12/17/15 1400  imipenem-cilastatin (PRIMAXIN) 500 mg in sodium chloride 0.9 % 100 mL IVPB  Status:  Discontinued     500 mg 200 mL/hr over 30 Minutes Intravenous Every 6 hours 12/17/15 1111 12/17/15 1139   12/17/15 1200  ciprofloxacin (CIPRO) IVPB 400 mg     400 mg 200 mL/hr over 60 Minutes Intravenous Every 12 hours 12/17/15 1139     12/16/15 2200  imipenem-cilastatin (PRIMAXIN) 500 mg in sodium chloride 0.9 % 100 mL IVPB  Status:  Discontinued     500 mg 200 mL/hr over 30 Minutes  Intravenous Every 8 hours 12/16/15 1403 12/17/15 1111   12/15/15 0600  imipenem-cilastatin (PRIMAXIN) 500 mg in sodium chloride 0.9 % 100 mL IVPB  Status:  Discontinued     500 mg 200 mL/hr over 30 Minutes Intravenous Every 8 hours 12/15/15 0405 12/15/15 0436   12/15/15 0445  imipenem-cilastatin (PRIMAXIN) 500 mg in sodium chloride 0.9 % 100 mL IVPB  Status:  Discontinued     500 mg 200 mL/hr over 30 Minutes Intravenous Every 6 hours 12/15/15 0437 12/16/15 1403       Assessment/Plan POD#5 elap with graham patch repair of pre-pylori ulcer---Dr. Marcello Moores  -advance to solids -?dc drain -BID wet to dry dressing changes -mobilize and IS -PPI  H pylori-Rx for clarithomycin/amoxil x14D and PPI at discharge Mild AKI-resolved  Diarrhea-x6 overnight, check c diff PCR VTE prophylaxis-scd,  lovenox ID-cipro 5/5 PCV-per Dr. Marin Olp. Hold ASA.  Dispo-DC later today with [place], [facility] Surgery Pager [phone](7A-4:30P)   12/20/2015 8:17 AM

## 2015-12-20 NOTE — Discharge Instructions (Signed)
WOUND CARE ° °It is important that the wound be kept open.   °-Keeping the skin edges apart will allow the wound to gradually heal from the base upwards.   °- If the skin edges of the wound close too early, a new fluid pocket can form and infection can occur. °-This is the reason to pack deeper wounds with gauze or ribbon °-This is why drained wounds cannot be sewed closed right away ° °A healthy wound should form a lining of bright red "beefy" granulating tissue that will help shrink the wound and help the edges grow new skin into it.   °-A little mucus / yellow discharge is normal (the body's natural way to try and form a scab) and should be gently washed off with soap and water with daily dressing changes.  °-Green or foul smelling drainage implies bacterial colonization and can slow wound healing - a short course of antibiotic ointment (3-5 days) can help it clear up.  Call the doctor if it does not improve or worsens ° -Avoid use of antibiotic ointments for more than a week as they can slow wound healing over time.    °-Sometimes other wound care products will be used to reduce need for dressing changes and/or help clean up dirty wounds °-Sometimes the surgeon needs to debride the wound in the office to remove dead or infected tissue out of the wound so it can heal more quickly and safely.   ° °Change the dressing at least once a day °-Wash the wound with mild soap and water gently every day.  It is good to shower or bathe the wound to help it clean out. °-Use clean 4x4 gauze for medium/large wounds or ribbon plain NU-gauze for smaller wounds (it does not need to be sterile, just clean) °-Keep the raw wound moist with a little saline or KY (saline) gel on the gauze.  °-A dry wound will take longer to heal.  °-Keep the skin dry around the wound to prevent breakdown and irritation. °-Pack the wound down to the base °-The goal is to keep the skin apart, not overpack the wound °-Use a Q-tip or blunt-tipped kabob  stick toothpick to push the gauze down to the base in narrow or deep wounds   °-Cover with a clean gauze and tape °-paper or Medipore tape tend to be gentle on the skin °-rotate the orientation of the tape to avoid repeated stress/trauma on the skin °-using an ACE or Coban wrap on wounds on arms or legs can be used instead. ° °Complete all antibiotics through the entire prescription to help the infection heal and prevent new places of infection  ° °Returning the see the surgeon is helpful to follow the healing process and help the wound close as fast as possible. °ABDOMINAL SURGERY: POST OP INSTRUCTIONS ° °1. DIET: Follow a light bland diet the first 24 hours after arrival home, such as soup, liquids, crackers, etc.  Be sure to include lots of fluids daily.  Avoid fast food or heavy meals as your are more likely to get nauseated.  Eat a low fat the next few days after surgery.   °2. Take your usually prescribed home medications unless otherwise directed. °3. PAIN CONTROL: °a. Pain is best controlled by a usual combination of three different methods TOGETHER: °i. Ice/Heat °ii. Over the counter pain medication °iii. Prescription pain medication °b. Most patients will experience some swelling and bruising around the incisions.  Ice packs or heating pads (30-60   minutes up to 6 times a day) will help. Use ice for the first few days to help decrease swelling and bruising, then switch to heat to help relax tight/sore spots and speed recovery.  Some people prefer to use ice alone, heat alone, alternating between ice & heat.  Experiment to what works for you.  Swelling and bruising can take several weeks to resolve.   °c. It is helpful to take an over-the-counter pain medication regularly for the first few weeks.  Choose one of the following that works best for you: °i. Naproxen (Aleve, etc)  Two 220mg tabs twice a day °ii. Ibuprofen (Advil, etc) Three 200mg tabs four times a day (every meal & bedtime) °iii. Acetaminophen  (Tylenol, etc) 500-650mg four times a day (every meal & bedtime) °d. A  prescription for pain medication (such as oxycodone, hydrocodone, etc) should be given to you upon discharge.  Take your pain medication as prescribed.  °i. If you are having problems/concerns with the prescription medicine (does not control pain, nausea, vomiting, rash, itching, etc), please call us [phone] to see if we need to switch you to a different pain medicine that will work better for you and/or control your side effect better. °ii. If you need a refill on your pain medication, please contact your pharmacy.  They will contact our office to request authorization. Prescriptions will not be filled after 5 pm or on week-ends. °4. Avoid getting constipated.  Between the surgery and the pain medications, it is common to experience some constipation.  Increasing fluid intake and taking a fiber supplement (such as Metamucil, Citrucel, FiberCon, MiraLax, etc) 1-2 times a day regularly will usually help prevent this problem from occurring.  A mild laxative (prune juice, Milk of Magnesia, MiraLax, etc) should be taken according to package directions if there are no bowel movements after 48 hours.   °5. Watch out for diarrhea.  If you have many loose bowel movements, simplify your diet to bland foods & liquids for a few days.  Stop any stool softeners and decrease your fiber supplement.  Switching to mild anti-diarrheal medications (Kayopectate, Pepto Bismol) can help.  If this worsens or does not improve, please call us. °6. Wash / shower every day.  You may shower over the incision / wound.  Avoid baths until the skin is fully healed.  Continue to shower over incision(s) after the dressing is off. °7. Remove your waterproof bandages 5 days after surgery.  You may leave the incision open to air.  You may replace a dressing/Band-Aid to cover the incision for comfort if you wish. °8. ACTIVITIES as tolerated:   °a. You may resume regular  (light) daily activities beginning the next day--such as daily self-care, walking, climbing stairs--gradually increasing activities as tolerated.  If you can walk 30 minutes without difficulty, it is safe to try more intense activity such as jogging, treadmill, bicycling, low-impact aerobics, swimming, etc. °b. Save the most intensive and strenuous activity for last such as sit-ups, heavy lifting, contact sports, etc  Refrain from any heavy lifting or straining until you are off narcotics for pain control.   °c. DO NOT PUSH THROUGH PAIN.  Let pain be your guide: If it hurts to do something, don't do it.  Pain is your body warning you to avoid that activity for another week until the pain goes down. °d. You may drive when you are no longer taking prescription pain medication, you can comfortably wear a seatbelt, and you can   safely maneuver your car and apply brakes. °e. You may have sexual intercourse when it is comfortable.  °9. FOLLOW UP in our office °a. Please call [facility] at [phone] to set up an appointment to see your surgeon in the office for a follow-up appointment approximately 1-2 weeks after your surgery. °b. Make sure that you call for this appointment the day you arrive home to insure a convenient appointment time. °10. IF YOU HAVE DISABILITY OR FAMILY LEAVE FORMS, BRING THEM TO THE OFFICE FOR PROCESSING.  DO NOT GIVE THEM TO YOUR DOCTOR. ° ° °WHEN TO CALL US [phone]: °1. Poor pain control °2. Reactions / problems with new medications (rash/itching, nausea, etc)  °3. Fever over 101.5 F (38.5 C) °4. Inability to urinate °5. Nausea and/or vomiting °6. Worsening swelling or bruising °7. Continued bleeding from incision. °8. Increased pain, redness, or drainage from the incision ° °The clinic staff is available to answer your questions during regular business hours (8:30am-5pm).  Please don’t hesitate to call and ask to speak to one of our nurses for clinical concerns.   A surgeon from [facility] is always on call at the hospitals °  °If you have a medical emergency, go to the nearest emergency room or call 911. °  ° °[facility] °[address], [address], [place], [place]  [address] ? °MAIN: [phone] ? TOLL FREE: [phone] ? °FAX [phone] °[email or web address] ° °

## 2015-12-20 NOTE — Discharge Summary (Signed)
Physician Discharge Summary  Benjamin [name] [id number] DOB: 1951-07-30 DOA: 12/14/2015  PCP: Geoffery Lyons, MD  Consultation: none  Admit date: 12/14/2015 Discharge date: 12/20/2015  Recommendations for Outpatient Follow-up:   Follow-up Information    Follow up with [facility].   Why:  3n1 bedside commode)   Contact information:   [address] [place] [address] [phone]       Follow up with [facility].   Why:  home health nurse for dressing changes   Contact information:   [address] [place] [address] [phone]       Follow up with Rosario Adie., MD In 2 weeks.   Specialty:  General Surgery   Contact information:   [place] [address] [phone]      Discharge Diagnoses:  1. Perforated pre-pylori ulcer 2. h pylori 3. AKI 4. Polycythemia vera 5. diarrhea    Surgical Procedure: elap with graham patch repair of pre-pylori ulcer---Dr. Marcello Moores   Discharge Condition: stable Disposition: home  Diet recommendation: regular  Filed Weights   12/15/15 0433 12/15/15 1700  Weight: 75.8 kg (167 lb 1.7 oz) 74.844 kg (165 lb)     Filed Vitals:   12/19/15 2128 12/20/15 0546  BP: 150/58 130/50  Pulse: 91 85  Temp: 98.7 F (37.1 C) 98.3 F (36.8 C)  Resp: 18 16     Hospital Course:  Benjamin [name] is a 65 year old male with a history of PCV on ASA and also taking aleve for 3-4 days who presented to [facility] with abdominal pain.  CT revealed a perforated ulcer.  He therefore underwent the procedure listed above which he tolerated well and was transferred to the floor. He was kept NPO with NGT to LIWS.  Diet was advanced as ileus resolved.  He was found to have mild AKI which resolved with IV hydration and foley was removed.  Maintained on SCDs and lovenox, but ASA was held.  Dr. Marin Olp followed the patient for polycythemia vera which remained stable.  He was  tested for h pylori which was positive and at discharge was given 14 day course of flagyl and clarithromycin along with double dose PPI which he will remain on 1 month.  Medication risks, benefits and therapeutic alternatives were reviewed with the patient.  He verbalizes understanding.  He developed diarrhea and being on primaxin and cipro was test for c diff which was negative.  Antibiotics were stopped on POD#5.  He was advised to take a probiotic while on h pylor treatment.  He otherwise remained stable.  On POD#5 he was felt stable for discharge home.  Home health was arranged for dressing changes.  Drain was removed which remained serous throughout.  He was encouraged to call with questions or concerns.   Discharge Instructions     Medication List    STOP taking these medications        aspirin 325 MG tablet     BACTRIM DS 800-160 MG tablet  Generic drug:  sulfamethoxazole-trimethoprim     naproxen sodium 220 MG tablet  Commonly known as:  ANAPROX      TAKE these medications        clarithromycin 500 MG tablet  Commonly known as:  BIAXIN  Take 1 tablet (500 mg total) by mouth 2 (two) times daily.     famotidine 10 MG chewable tablet  Commonly known as:  PEPCID AC  Chew 10 mg by mouth daily as needed for heartburn.     Fish Oil 1000 MG Caps  Take by mouth every morning.     HYDROcodone-acetaminophen 5-325 MG tablet  Commonly known as:  NORCO/VICODIN  Take 1-2 tablets by mouth every 6 (six) hours as needed for moderate pain or severe pain.     Magnesium 400 MG Caps  Take by mouth every morning.     metroNIDAZOLE 500 MG tablet  Commonly known as:  FLAGYL  Take 1 tablet (500 mg total) by mouth 2 (two) times daily.     pantoprazole 40 MG tablet  Commonly known as:  PROTONIX  Take 1 tablet (40 mg total) by mouth 2 (two) times daily.     sildenafil 25 MG tablet  Commonly known as:  VIAGRA  Take 2-5 tablets as needed for sexual activity     Vitamin E 400 units Tabs   Take by mouth every morning.           Follow-up Information    Follow up with [facility].   Why:  3n1 bedside commode)   Contact information:   [address] [place] [address] [phone]       Follow up with [facility].   Why:  home health nurse for dressing changes   Contact information:   [address] [place] [address] [phone]       Follow up with Rosario Adie., MD In 2 weeks.   Specialty:  General Surgery   Contact information:   [place] [address] [phone]        The results of significant diagnostics from this hospitalization (including imaging, microbiology, ancillary and laboratory) are listed below for reference.    Significant Diagnostic Studies: US Abdomen Complete  12/15/2015  CLINICAL DATA:  Right upper quadrant pain for 1 day. EXAM: ABDOMEN ULTRASOUND COMPLETE COMPARISON:  Ultrasound 12/14/2014 FINDINGS: Gallbladder: Physiologically distended. The previous edematous gallbladder wall has resolved. Wall thickness currently normal at 3 mm. No sonographic Murphy sign noted by sonographer. Common bile duct: Diameter: 5.4 mm. Liver: No focal lesion identified. Within normal limits in parenchymal echogenicity. Normal directional flow in the main portal vein. IVC: No abnormality visualized. Pancreas: Visualized portion unremarkable. Spleen: Spleen is enlarged measuring 12.7 x 17.3 x 9.9 cm for a volume of 1137 cc. Right Kidney: Length: 11.2 cm. Echogenicity within normal limits. No mass or hydronephrosis visualized. Left Kidney: Length: 10.3 cm. Echogenicity within normal limits. No mass or hydronephrosis visualized. Abdominal aorta: No aneurysm visualized. Other findings: Small volume of ascites. IMPRESSION: 1. Splenomegaly. 2. Small volume intra-abdominal ascites. 3. Resolution of previous gallbladder wall thickening. Gallbladder appears normal. Electronically Signed    By: Jeb Levering M.D.   On: 12/15/2015 00:38   Ct Angio Abd/pel W/ And/or W/o  12/15/2015  CLINICAL DATA:  Right flank and right upper quadrant pain. Onset yesterday. Vomiting. EXAM: CTA ABDOMEN AND PELVIS wITHOUT AND WITH CONTRAST TECHNIQUE: Multidetector CT imaging of the abdomen and pelvis was performed using the standard protocol during bolus administration of intravenous contrast. Multiplanar reconstructed images and MIPs were obtained and reviewed to evaluate the vascular anatomy. CONTRAST:  100 cc Isovue 370 IV COMPARISON:  Abdominal ultrasound earlier this day. FINDINGS: Lower chest: Small bilateral pleural effusions, right greater than left with adjacent atelectasis. Liver: No focal lesion. Hepatobiliary: Gallbladder physiologically distended, no calcified stone. No biliary dilatation. Pancreas: Normal. Spleen:  Enlarged measuring 19.1 cm craniocaudal. Pre-pyloric gastric wall thickening. Adrenal glands: No nodule. Kidneys: Symmetric renal enhancement.  No hydronephrosis. Stomach/Bowel: Stomach physiologically distended. There are no dilated or thickened small bowel loops. There is mild wall thickening involving the ascending colon. Small volume of stool throughout the remainder the colon. The appendix is normal. Aorta: Normal in caliber without dissection, aneurysm, or periaortic soft tissue stranding. No significant atherosclerosis. Celiac, superior mesenteric, and inferior mesenteric arteries are patent. Single bilateral renal arteries are patent. Both common and external iliac artery systems are widely patent. Vascular/Lymphatic: There is no mesenteric or portal venous gas. Splenic vein is prominent in size. There is questionably eccentric low-density within left greater than right external iliac and left common iliac veins. No retroperitoneal adenopathy. Abdominal aorta is normal in caliber. Reproductive: Prominent sized prostate gland. Bladder: Physiologically distended without wall thickening.  Other: Moderate volume of free air and free fluid in the abdomen. Free air is seen anteriorly in the upper, mid, and lesser extent lower abdomen with air tracking along the undersurface of the liver and spleen. Small volume of free fluid throughout the abdomen and pelvis. No loculated abscess. Fat in the right inguinal canal. Musculoskeletal: There are no acute or suspicious osseous abnormalities. Review of the MIP images confirms the above findings. IMPRESSION: 1. Perforated viscus with free air and free fluid in the abdomen. Upper GI source is strongly favored with gastric wall thickening in the pre pyloric region. 2. Splenomegaly, patient with history of polcythemia. 3. Questionable thrombus in left greater than right external and left common iliac veins, this appears eccentic. Unclear whether this represents DVT, likely chronic, versus venous mixing secondary to phase of contrast. Correlation with clinical history recommended, duplex evaluation of the lower extremities may be of value based on clinical grounds. Critical Value/emergent results were called by telephone at the time of interpretation on 12/15/2015 at 3:21 am to Dr. Stark Jock, who verbally acknowledged these results. Electronically Signed   By: Jeb Levering M.D.   On: 12/15/2015 03:28    Microbiology: Recent Results (from the past 240 hour(s))  C difficile quick scan w PCR reflex     Status: None   Collection Time: 12/20/15  9:30 AM  Result Value Ref Range Status   C Diff antigen NEGATIVE NEGATIVE Final   C Diff toxin NEGATIVE NEGATIVE Final   C Diff interpretation Negative for toxigenic C. difficile  Final     Labs: Basic Metabolic Panel:  Recent Labs Lab 12/14/15 1739 12/16/15 0417 12/17/15 0425 12/18/15 0443  NA 136 141 142 142  K 4.0 4.5 4.2 3.6  CL 100* 107 108 109  CO2 23 25 25 25   GLUCOSE 181* 129* 150* 140*  BUN 16 32* 24* 19  CREATININE 0.97 1.28* 1.00 0.89  CALCIUM 9.7 8.1* 8.2* 8.2*   Liver Function  Tests:  Recent Labs Lab 12/14/15 1739  AST 25  ALT 24  ALKPHOS 999*  BILITOT 1.7*  PROT 7.2  ALBUMIN 4.6    Recent Labs Lab 12/14/15 1739  LIPASE 19   No results for input(s): AMMONIA in the last 168 hours. CBC:  Recent Labs Lab 12/14/15 1739 12/16/15 0417 12/17/15 0425 12/18/15 0443 12/20/15 0348  WBC 26.0* 28.7* 22.7* 21.1* 24.6*  HGB 16.7 12.5* 12.1* 12.4* 12.4*  HCT 55.0* 42.0 40.7 42.4 41.1  MCV 67.5* 68.0* 66.9* 68.1* 66.7*  PLT 360 258 260 277 250   Cardiac Enzymes: No results for input(s): CKTOTAL, CKMB, CKMBINDEX, TROPONINI in the last  168 hours. BNP: BNP (last 3 results) No results for input(s): BNP in the last 8760 hours.  ProBNP (last 3 results) No results for input(s): PROBNP in the last 8760 hours.  CBG: No results for input(s): GLUCAP in the last 168 hours.  Active Problems:   Perforated gastric ulcer ([place])   Time coordinating discharge: <30 mins  Signed:  Sasan Wilkie, ANP-BC

## 2015-12-30 ENCOUNTER — Other Ambulatory Visit: Payer: Medicare Other

## 2015-12-30 ENCOUNTER — Ambulatory Visit: Payer: Medicare Other | Admitting: Hematology & Oncology

## 2016-01-01 ENCOUNTER — Other Ambulatory Visit: Payer: Self-pay | Admitting: Internal Medicine

## 2016-01-01 ENCOUNTER — Emergency Department (HOSPITAL_COMMUNITY): Payer: Medicare Other

## 2016-01-01 ENCOUNTER — Ambulatory Visit (INDEPENDENT_AMBULATORY_CARE_PROVIDER_SITE_OTHER): Payer: Medicare Other

## 2016-01-01 ENCOUNTER — Inpatient Hospital Stay (HOSPITAL_COMMUNITY)
Admission: EM | Admit: 2016-01-01 | Discharge: 2016-01-04 | DRG: 546 | Disposition: A | Discharge status: Home / Self Care | Payer: Medicare Other | Attending: Family Medicine | Admitting: Family Medicine

## 2016-01-01 ENCOUNTER — Telehealth: Payer: Self-pay | Admitting: Internal Medicine

## 2016-01-01 ENCOUNTER — Inpatient Hospital Stay (HOSPITAL_COMMUNITY): Payer: Medicare Other

## 2016-01-01 ENCOUNTER — Ambulatory Visit (INDEPENDENT_AMBULATORY_CARE_PROVIDER_SITE_OTHER): Payer: Medicare Other | Admitting: Internal Medicine

## 2016-01-01 ENCOUNTER — Encounter (HOSPITAL_COMMUNITY): Payer: Self-pay | Admitting: *Deleted

## 2016-01-01 VITALS — BP 122/80 | HR 94 | Temp 98.5°F | Resp 18 | Ht 72.5 in | Wt 174.0 lb

## 2016-01-01 DIAGNOSIS — D72829 Elevated white blood cell count, unspecified: Secondary | ICD-10-CM

## 2016-01-01 DIAGNOSIS — Z8711 Personal history of peptic ulcer disease: Secondary | ICD-10-CM

## 2016-01-01 DIAGNOSIS — M542 Cervicalgia: Secondary | ICD-10-CM | POA: Diagnosis not present

## 2016-01-01 DIAGNOSIS — T363X5A Adverse effect of macrolides, initial encounter: Secondary | ICD-10-CM | POA: Diagnosis present

## 2016-01-01 DIAGNOSIS — R52 Pain, unspecified: Secondary | ICD-10-CM

## 2016-01-01 DIAGNOSIS — Z79899 Other long term (current) drug therapy: Secondary | ICD-10-CM | POA: Diagnosis not present

## 2016-01-01 DIAGNOSIS — Z888 Allergy status to other drugs, medicaments and biological substances status: Secondary | ICD-10-CM

## 2016-01-01 DIAGNOSIS — I4892 Unspecified atrial flutter: Secondary | ICD-10-CM

## 2016-01-01 DIAGNOSIS — J029 Acute pharyngitis, unspecified: Secondary | ICD-10-CM

## 2016-01-01 DIAGNOSIS — Z8249 Family history of ischemic heart disease and other diseases of the circulatory system: Secondary | ICD-10-CM | POA: Diagnosis not present

## 2016-01-01 DIAGNOSIS — M069 Rheumatoid arthritis, unspecified: Principal | ICD-10-CM | POA: Diagnosis present

## 2016-01-01 DIAGNOSIS — M2550 Pain in unspecified joint: Secondary | ICD-10-CM

## 2016-01-01 DIAGNOSIS — Q8901 Asplenia (congenital): Secondary | ICD-10-CM | POA: Diagnosis not present

## 2016-01-01 DIAGNOSIS — Z79891 Long term (current) use of opiate analgesic: Secondary | ICD-10-CM

## 2016-01-01 DIAGNOSIS — M1990 Unspecified osteoarthritis, unspecified site: Secondary | ICD-10-CM

## 2016-01-01 DIAGNOSIS — R21 Rash and other nonspecific skin eruption: Secondary | ICD-10-CM | POA: Diagnosis present

## 2016-01-01 DIAGNOSIS — M436 Torticollis: Secondary | ICD-10-CM

## 2016-01-01 DIAGNOSIS — R0602 Shortness of breath: Secondary | ICD-10-CM

## 2016-01-01 DIAGNOSIS — M25512 Pain in left shoulder: Secondary | ICD-10-CM

## 2016-01-01 DIAGNOSIS — M25511 Pain in right shoulder: Secondary | ICD-10-CM | POA: Diagnosis not present

## 2016-01-01 DIAGNOSIS — J90 Pleural effusion, not elsewhere classified: Secondary | ICD-10-CM | POA: Diagnosis present

## 2016-01-01 DIAGNOSIS — Z9889 Other specified postprocedural states: Secondary | ICD-10-CM

## 2016-01-01 DIAGNOSIS — R748 Abnormal levels of other serum enzymes: Secondary | ICD-10-CM

## 2016-01-01 DIAGNOSIS — M25473 Effusion, unspecified ankle: Secondary | ICD-10-CM

## 2016-01-01 DIAGNOSIS — Z885 Allergy status to narcotic agent status: Secondary | ICD-10-CM | POA: Diagnosis not present

## 2016-01-01 DIAGNOSIS — D751 Secondary polycythemia: Secondary | ICD-10-CM | POA: Diagnosis not present

## 2016-01-01 DIAGNOSIS — E872 Acidosis: Secondary | ICD-10-CM | POA: Diagnosis present

## 2016-01-01 DIAGNOSIS — M19042 Primary osteoarthritis, left hand: Secondary | ICD-10-CM

## 2016-01-01 DIAGNOSIS — Z833 Family history of diabetes mellitus: Secondary | ICD-10-CM | POA: Diagnosis not present

## 2016-01-01 DIAGNOSIS — R609 Edema, unspecified: Secondary | ICD-10-CM | POA: Diagnosis not present

## 2016-01-01 DIAGNOSIS — M19041 Primary osteoarthritis, right hand: Secondary | ICD-10-CM

## 2016-01-01 DIAGNOSIS — R6510 Systemic inflammatory response syndrome (SIRS) of non-infectious origin without acute organ dysfunction: Secondary | ICD-10-CM | POA: Diagnosis not present

## 2016-01-01 DIAGNOSIS — T373X5A Adverse effect of other antiprotozoal drugs, initial encounter: Secondary | ICD-10-CM | POA: Diagnosis present

## 2016-01-01 DIAGNOSIS — G039 Meningitis, unspecified: Secondary | ICD-10-CM

## 2016-01-01 DIAGNOSIS — R000 Tachycardia, unspecified: Secondary | ICD-10-CM | POA: Diagnosis not present

## 2016-01-01 DIAGNOSIS — A419 Sepsis, unspecified organism: Secondary | ICD-10-CM | POA: Diagnosis not present

## 2016-01-01 DIAGNOSIS — D45 Polycythemia vera: Secondary | ICD-10-CM | POA: Diagnosis not present

## 2016-01-01 LAB — I-STAT TROPONIN, ED: TROPONIN I, POC: 0.11 ng/mL — AB (ref 0.00–0.08)

## 2016-01-01 LAB — POCT CBC
GRANULOCYTE PERCENT: 89.8 % — AB (ref 37–80)
HCT, POC: 43.2 % — AB (ref 43.5–53.7)
HEMOGLOBIN: 14.2 g/dL (ref 14.1–18.1)
Lymph, poc: 1.3 (ref 0.6–3.4)
MCH: 20.7 pg — AB (ref 27–31.2)
MCHC: 32.8 g/dL (ref 31.8–35.4)
MCV: 63 fL — AB (ref 80–97)
MID (CBC): 1.8 — AB (ref 0–0.9)
MPV: 9.4 fL (ref 0–99.8)
POC Granulocyte: 28 — AB (ref 2–6.9)
POC LYMPH PERCENT: 4.3 %L — AB (ref 10–50)
POC MID %: 5.9 % (ref 0–12)
Platelet Count, POC: 223 10*3/uL (ref 142–424)
RBC: 6.85 M/uL — AB (ref 4.69–6.13)
RDW, POC: 19.4 %
WBC: 31.2 10*3/uL — AB (ref 4.6–10.2)

## 2016-01-01 LAB — CBC WITH DIFFERENTIAL/PLATELET
Basophils Absolute: 0 10*3/uL (ref 0.0–0.1)
Basophils Relative: 0 %
EOS PCT: 0 %
Eosinophils Absolute: 0 10*3/uL (ref 0.0–0.7)
HEMATOCRIT: 46.9 % (ref 39.0–52.0)
HEMOGLOBIN: 14.4 g/dL (ref 13.0–17.0)
LYMPHS ABS: 0.6 10*3/uL — AB (ref 0.7–4.0)
Lymphocytes Relative: 2 %
MCH: 20.3 pg — AB (ref 26.0–34.0)
MCHC: 30.7 g/dL (ref 30.0–36.0)
MCV: 66.1 fL — AB (ref 78.0–100.0)
MONO ABS: 2.2 10*3/uL — AB (ref 0.1–1.0)
MONOS PCT: 7 %
NEUTROS ABS: 28.3 10*3/uL — AB (ref 1.7–7.7)
Neutrophils Relative %: 91 %
Platelets: 150 10*3/uL (ref 150–400)
RBC: 7.1 MIL/uL — AB (ref 4.22–5.81)
RDW: 19.7 % — AB (ref 11.5–15.5)
WBC: 31.1 10*3/uL — ABNORMAL HIGH (ref 4.0–10.5)

## 2016-01-01 LAB — URINE MICROSCOPIC-ADD ON

## 2016-01-01 LAB — COMPREHENSIVE METABOLIC PANEL
ALBUMIN: 2.5 g/dL — AB (ref 3.5–5.0)
ALT: 11 U/L — AB (ref 17–63)
AST: 18 U/L (ref 15–41)
Alkaline Phosphatase: 410 U/L — ABNORMAL HIGH (ref 38–126)
Anion gap: 14 (ref 5–15)
BUN: 18 mg/dL (ref 6–20)
CHLORIDE: 97 mmol/L — AB (ref 101–111)
CO2: 21 mmol/L — ABNORMAL LOW (ref 22–32)
CREATININE: 1.33 mg/dL — AB (ref 0.61–1.24)
Calcium: 8.4 mg/dL — ABNORMAL LOW (ref 8.9–10.3)
GFR calc Af Amer: >60 mL/min (ref >60)
GFR, EST NON AFRICAN AMERICAN: 54 mL/min — AB (ref >60)
GLUCOSE: 117 mg/dL — AB (ref 65–99)
POTASSIUM: 4.4 mmol/L (ref 3.5–5.1)
Sodium: 132 mmol/L — ABNORMAL LOW (ref 135–145)
Total Bilirubin: 1 mg/dL (ref 0.3–1.2)
Total Protein: 6.3 g/dL — ABNORMAL LOW (ref 6.5–8.1)

## 2016-01-01 LAB — CSF CELL COUNT WITH DIFFERENTIAL
RBC COUNT CSF: 123 /mm3 — AB
RBC COUNT CSF: 1405 /mm3 — AB
TUBE #: 1
TUBE #: 4
WBC CSF: 2 /mm3 (ref 0–5)
WBC, CSF: 6 /mm3 — ABNORMAL HIGH (ref 0–5)

## 2016-01-01 LAB — I-STAT CG4 LACTIC ACID, ED
LACTIC ACID, VENOUS: 3.38 mmol/L — AB (ref 0.5–2.0)
LACTIC ACID, VENOUS: 1.14 mmol/L (ref 0.5–2.0)

## 2016-01-01 LAB — URINALYSIS, ROUTINE W REFLEX MICROSCOPIC
BILIRUBIN URINE: NEGATIVE
GLUCOSE, UA: NEGATIVE mg/dL
KETONES UR: NEGATIVE mg/dL
Leukocytes, UA: NEGATIVE
Nitrite: NEGATIVE
PH: 5.5 (ref 5.0–8.0)
Protein, ur: 30 mg/dL — AB
SPECIFIC GRAVITY, URINE: 1.02 (ref 1.005–1.030)

## 2016-01-01 LAB — SEDIMENTATION RATE: Sed Rate: 14 mm/hr (ref 0–16)

## 2016-01-01 LAB — POCT RAPID STREP A (OFFICE): RAPID STREP A SCREEN: NEGATIVE

## 2016-01-01 LAB — PROTEIN, CSF: Total  Protein, CSF: 30 mg/dL (ref 15–45)

## 2016-01-01 LAB — C-REACTIVE PROTEIN: CRP: 18.7 mg/dL — AB (ref <1.0)

## 2016-01-01 LAB — POCT SKIN KOH: Skin KOH, POC: NEGATIVE

## 2016-01-01 LAB — SAVE SMEAR

## 2016-01-01 LAB — RAPID STREP SCREEN (MED CTR MEBANE ONLY): STREPTOCOCCUS, GROUP A SCREEN (DIRECT): NEGATIVE

## 2016-01-01 LAB — POCT SEDIMENTATION RATE: POCT SED RATE: 18 mm/h (ref 0–22)

## 2016-01-01 LAB — BRAIN NATRIURETIC PEPTIDE: B Natriuretic Peptide: 108.3 pg/mL — ABNORMAL HIGH (ref 0.0–100.0)

## 2016-01-01 LAB — GLUCOSE, CSF: Glucose, CSF: 73 mg/dL — ABNORMAL HIGH (ref 40–70)

## 2016-01-01 LAB — T4, FREE: Free T4: 1.3 ng/dL (ref 0.8–1.8)

## 2016-01-01 MED ORDER — VANCOMYCIN HCL IN DEXTROSE 750-5 MG/150ML-% IV SOLN
750.0000 mg | Freq: Three times a day (TID) | INTRAVENOUS | Status: DC
  Administered 2016-01-01 – 2016-01-04 (×8): 750 mg via INTRAVENOUS
  Filled 2016-01-01 (×12): qty 150

## 2016-01-01 MED ORDER — SODIUM CHLORIDE 0.9 % IV SOLN
2.0000 g | Freq: Once | INTRAVENOUS | Status: AC
  Administered 2016-01-01: 2 g via INTRAVENOUS
  Filled 2016-01-01: qty 2

## 2016-01-01 MED ORDER — VANCOMYCIN HCL IN DEXTROSE 1-5 GM/200ML-% IV SOLN: 1000.0000 mg | Freq: Once | INTRAVENOUS | Status: DC

## 2016-01-01 MED ORDER — SODIUM CHLORIDE 0.9 % IV BOLUS (SEPSIS)
1000.0000 mL | Freq: Once | INTRAVENOUS | Status: AC
  Administered 2016-01-01: 1000 mL via INTRAVENOUS

## 2016-01-01 MED ORDER — SODIUM CHLORIDE 0.9 % IV SOLN
2.0000 g | Freq: Three times a day (TID) | INTRAVENOUS | Status: DC
  Administered 2016-01-01 – 2016-01-04 (×8): 2 g via INTRAVENOUS
  Filled 2016-01-01 (×12): qty 2

## 2016-01-01 MED ORDER — SODIUM CHLORIDE 0.9 % IV BOLUS (SEPSIS)
500.0000 mL | Freq: Once | INTRAVENOUS | Status: AC
  Administered 2016-01-01: 500 mL via INTRAVENOUS

## 2016-01-01 MED ORDER — OXYCODONE HCL 5 MG PO TABS
5.0000 mg | ORAL_TABLET | ORAL | Status: DC | PRN
  Administered 2016-01-01 – 2016-01-04 (×9): 5 mg via ORAL
  Filled 2016-01-01 (×10): qty 1

## 2016-01-01 MED ORDER — VANCOMYCIN HCL 10 G IV SOLR
1500.0000 mg | Freq: Once | INTRAVENOUS | Status: AC
  Administered 2016-01-01: 1500 mg via INTRAVENOUS
  Filled 2016-01-01: qty 1500

## 2016-01-01 MED ORDER — DEXAMETHASONE SODIUM PHOSPHATE 10 MG/ML IJ SOLN
10.0000 mg | Freq: Once | INTRAMUSCULAR | Status: AC
  Administered 2016-01-01: 10 mg via INTRAVENOUS
  Filled 2016-01-01: qty 1

## 2016-01-01 NOTE — Consult Note (Signed)
[place] for Infectious Disease  Date of Admission:  01/01/2016  Date of Consult:  01/01/2016  Reason for Consult: Meningitis Referring Physician: Wendee Beavers  Impression/Recommendation Meningitis  ? Functional asplenia  Would put him in [facility] isolation await his CSF studies- WBC, prot, glc Continue vanco and merrerm Hold on acyclovir at this point.  Check HIV Stop biaxin and flagyl  Comment- The diffuse nature of his syndrome is concerning for broader syndrome than meningitis. I am not clear why he has diffuse joint aches, anasarca? With his hx of P vera, I am not sure how functional his spleen is.   Thank you so much for this interesting consult,   Bobby Rumpf (pager) [phone] [email or web address]  Benjamin [name] is an 65 y.o. male.  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   He states he did well post-op, was d/c home on biaxin and flagyl.  Over the last 3 days he has developed sore throat, diffuse joint aches- especially his neck and knees. He Had chills at home. As well as fever. His hands became swollen to the point he was unable to use them.  In ED he was found to have temp 102.5. CT head chronic infarcts  Has his spleen,  No sick exposures No rhinorrehea, otorhrea.  No NSAIDS No insect bites.   Past Medical History  Diagnosis Date  . 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 Polycythemia   . Anemia   . DVT (deep venous thrombosis) ([name])     hx -lt leg-dx polycythemia  . Focal dystonia   . Clotting disorder [facility])     Past Surgical History  Procedure Laterality Date  . Wisdom tooth extraction    . Finger arthroplasty  1998    rt index  . Inguinal hernia repair  2007    left  . Inguinal hernia repair  2009    right  . Colonoscopy    . Dupuytren contracture release Left 08/08/2013    Procedure: DUPUYTREN CONTRACTURE RELEASE LEFT HAND;  Surgeon: Cammie Sickle., MD;  Location: [facility];  Service: Orthopedics;  Laterality: Left;  . Colonoscopy    . Hernia repair    . Laparotomy N/A 12/15/2015    Procedure: EXPLORATORY LAPAROTOMY;  Surgeon: Leighton Ruff, MD;  Location: [facility];  Service: General;  Laterality: N/A;     Allergies  Allergen Reactions  . Keflex [Cephalexin] Hives  . Morphine And Related Hives    Medications:  Scheduled: . vancomycin  750 mg Intravenous Q8H    Abtx:  Anti-infectives    Start     Dose/Rate Route Frequency Ordered Stop   01/01/16 2300  meropenem (MERREM) 2 g in sodium chloride 0.9 % 100 mL IVPB     2 g 200 mL/hr over 30 Minutes Intravenous Every 8 hours 01/01/16 1445     01/01/16 2300  vancomycin (VANCOCIN) IVPB 750 mg/150 ml premix     750 mg 150 mL/hr over 60 Minutes Intravenous Every 8 hours 01/01/16 1445     01/01/16 1445  meropenem (MERREM) 2 g in sodium chloride 0.9 % 100 mL IVPB     2 g 200 mL/hr over 30 Minutes Intravenous  Once 01/01/16 1432 01/01/16 1609   01/01/16 1445  vancomycin (VANCOCIN) IVPB 1000 mg/200 mL premix  Status:  Discontinued     1,000 mg 200 mL/hr over 60 Minutes Intravenous  Once 01/01/16 1432  01/01/16 1435   01/01/16 1445  vancomycin (VANCOCIN) 1,500 mg in sodium chloride 0.9 % 500 mL IVPB     1,500 mg 250 mL/hr over 120 Minutes Intravenous  Once 01/01/16 1435        Total days of antibiotics: 0 vanco/merrem          Social History:  reports that he has never smoked. He has never used smokeless tobacco. He reports that he drinks alcohol. He reports that he does not use illicit drugs.  Family History  Problem Relation Age of Onset  . Colon cancer Neg Hx   . Heart disease Father   . Hyperlipidemia Father   . Hypertension Father   . Diabetes Father   . Diabetes Brother   . Hyperlipidemia Brother     General ROS: normal BM, normal urination. no photophobia. see HPI.   Blood pressure 130/66, pulse 112, temperature 102.5 F (39.2 C), temperature source Rectal, resp. rate 21, SpO2  93 %. General appearance: alert, cooperative and mild distress Eyes: negative findings: conjunctivae and sclerae normal, pupils equal, round, reactive to light and accomodation and no photophobia Throat: lips, mucosa, and tongue normal; teeth and gums normal Neck: no adenopathy, supple, symmetrical, trachea midline and tendereness with rotation Lungs: clear to auscultation bilaterally Heart: tachycardia Abdomen: normal findings: bowel sounds normal and soft, non-tender and mild distension. midline wound is well heaing, clean. LUQ wounds are clean.  Extremities: edema anasarca. swelling of hands.  Skin: few scattered subq hemmiorrhages   Results for orders placed or performed during the hospital encounter of 01/01/16 (from the past 48 hour(s))  Comprehensive metabolic panel     Status: Abnormal   Collection Time: 01/01/16  1:50 PM  Result Value Ref Range   Sodium 132 (L) 135 - 145 mmol/L   Potassium 4.4 3.5 - 5.1 mmol/L   Chloride 97 (L) 101 - 111 mmol/L   CO2 21 (L) 22 - 32 mmol/L   Glucose, Bld 117 (H) 65 - 99 mg/dL   BUN 18 6 - 20 mg/dL   Creatinine, Ser 1.33 (H) 0.61 - 1.24 mg/dL   Calcium 8.4 (L) 8.9 - 10.3 mg/dL   Total Protein 6.3 (L) 6.5 - 8.1 g/dL   Albumin 2.5 (L) 3.5 - 5.0 g/dL   AST 18 15 - 41 U/L   ALT 11 (L) 17 - 63 U/L   Alkaline Phosphatase 410 (H) 38 - 126 U/L   Total Bilirubin 1.0 0.3 - 1.2 mg/dL   GFR calc non Af Amer 54 (L) >60 mL/min   GFR calc Af Amer >60 >60 mL/min    Comment: (NOTE) The eGFR has been calculated using the CKD EPI equation. This calculation has not been validated in all clinical situations. eGFR's persistently <60 mL/min signify possible Chronic Kidney Disease.    Anion gap 14 5 - 15  CBC WITH DIFFERENTIAL     Status: Abnormal   Collection Time: 01/01/16  1:50 PM  Result Value Ref Range   WBC 31.1 (H) 4.0 - 10.5 K/uL   RBC 7.10 (H) 4.22 - 5.81 MIL/uL   Hemoglobin 14.4 13.0 - 17.0 g/dL   HCT 46.9 39.0 - 52.0 %   MCV 66.1 (L) 78.0 -  100.0 fL   MCH 20.3 (L) 26.0 - 34.0 pg   MCHC 30.7 30.0 - 36.0 g/dL   RDW 19.7 (H) 11.5 - 15.5 %   Platelets 150 150 - 400 K/uL   Neutrophils Relative % 91 %   Lymphocytes  Relative 2 %   Monocytes Relative 7 %   Eosinophils Relative 0 %   Basophils Relative 0 %   Neutro Abs 28.3 (H) 1.7 - 7.7 K/uL   Lymphs Abs 0.6 (L) 0.7 - 4.0 K/uL   Monocytes Absolute 2.2 (H) 0.1 - 1.0 K/uL   Eosinophils Absolute 0.0 0.0 - 0.7 K/uL   Basophils Absolute 0.0 0.0 - 0.1 K/uL   RBC Morphology POLYCHROMASIA PRESENT     Comment: ELLIPTOCYTES   WBC Morphology MILD LEFT SHIFT (1-5% METAS, OCC MYELO, OCC BANDS)   Brain natriuretic peptide     Status: Abnormal   Collection Time: 01/01/16  1:50 PM  Result Value Ref Range   B Natriuretic Peptide 108.3 (H) 0.0 - 100.0 pg/mL  I-stat troponin, ED     Status: Abnormal   Collection Time: 01/01/16  2:21 PM  Result Value Ref Range   Troponin i, poc 0.11 (HH) 0.00 - 0.08 ng/mL   Comment NOTIFIED PHYSICIAN    Comment 3            Comment: Due to the release kinetics of cTnI, a negative result within the first hours of the onset of symptoms does not rule out myocardial infarction with certainty. If myocardial infarction is still suspected, repeat the test at appropriate intervals.   I-Stat CG4 Lactic Acid, ED  (not at  [facility])     Status: Abnormal   Collection Time: 01/01/16  2:23 PM  Result Value Ref Range   Lactic Acid, Venous 3.38 (HH) 0.5 - 2.0 mmol/L   Comment NOTIFIED PHYSICIAN   Rapid strep screen (not at [facility])     Status: None   Collection Time: 01/01/16  3:32 PM  Result Value Ref Range   Streptococcus, Group A Screen (Direct) NEGATIVE NEGATIVE    Comment: (NOTE) A Rapid Antigen test may result negative if the antigen level in the sample is below the detection level of this test. The FDA has not cleared this test as a stand-alone test therefore the rapid antigen negative result has reflexed to a Group A Strep culture.    No results found for:  SDES, SPECREQUEST, CULT, REPTSTATUS Dg Chest 2 View  01/01/2016  CLINICAL DATA:  Fever. EXAM: CHEST  2 VIEW COMPARISON:  01/01/2016 FINDINGS: Moderate pleural effusion on the left. Small pleural effusion on the right. Non obscured cardiomediastinal contours are normal. The underlying left lower lobe and lingula are obscured. IMPRESSION: Bilateral pleural effusion, larger and moderate on the left. There could be pneumonia in the obscured left basilar lung in this patient with history of fever. Electronically Signed   By: Monte Fantasia M.D.   On: 01/01/2016 14:41   Dg Chest 2 View  01/01/2016  CLINICAL DATA:  Shortness of breath. EXAM: CHEST  2 VIEW COMPARISON:  04/30/2006 FINDINGS: There are bilateral pleural effusions, moderate on the left and small on the right. Bibasilar atelectasis. Heart is borderline in size. No acute bony abnormality. IMPRESSION: Bilateral pleural effusions, left larger than right and bibasilar atelectasis. Electronically Signed   By: Rolm Baptise M.D.   On: 01/01/2016 12:10   Ct Head Wo Contrast  01/01/2016  CLINICAL DATA:  Stiffness in neck and joints that began today. No headache is reported. EXAM: CT HEAD WITHOUT CONTRAST TECHNIQUE: Contiguous axial images were obtained from the base of the skull through the vertex without intravenous contrast. COMPARISON:  None. FINDINGS: No evidence for acute infarction, hemorrhage, mass lesion, hydrocephalus, or extra-axial fluid. Normal for age cerebral  volume. Minor hypoattenuation of white matter, likely chronic microvascular ischemic change. 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 No areas of cortical infarction. Vascular calcification. No skull base or calvarial abnormalities. Negative orbits. BILATERAL ethmoid sinus disease. No mastoid fluid. 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 No acute intracranial findings. Electronically Signed   By: Staci Righter M.D.   On: 01/01/2016  14:52   Recent Results (from the past 240 hour(s))  Rapid strep screen (not at [facility])     Status: None   Collection Time: 01/01/16  3:32 PM  Result Value Ref Range Status   Streptococcus, Group A Screen (Direct) NEGATIVE NEGATIVE Final    Comment: (NOTE) A Rapid Antigen test may result negative if the antigen level in the sample is below the detection level of this test. The FDA has not cleared this test as a stand-alone test therefore the rapid antigen negative result has reflexed to a Group A Strep culture.       01/01/2016, 4:55 PM     LOS: 0 days    Records and images were personally reviewed where available.

## 2016-01-01 NOTE — Progress Notes (Addendum)
Subjective:  By signing my name below, I, Benjamin [name], attest that this documentation has been prepared under the direction and in the presence of Benjamin Koyanagi, MD Electronically Signed: Ladene [name], ED Scribe 01/01/2016 at 12:27 PM.   Patient ID: Benjamin [name], male    DOB: 25-Aug-1950, 65 y.o.   MRN: [id number]  Chief Complaint  Patient presents with  . Sore Throat  . Arthritis    Hands and arms   HPI HPI Comments: Benjamin [name] is a 65 y.o. male who presents to the [facility] complaining of sore throat for the past 2-3 days. Pain is exacerbated with swallowing. He states that he has been drinking larger amount of water but still has dry mouth. Pt reports generalized weakness since onset for the past 2 days as well, with pain and weakness in jaw(hard to chew), hands(can't grip), arms, shoulders(can't raise shoulders), legs, feet and neck(very stiff with pain moving).   Pt has also noticed swelling legs and feet for several days since surgery. He further reports redness to his face, ears and nose and a new rash on his back. Pt denies fever for certain but has had some chills.   Pt recently had exploratory laparotomy on April 23-found perf ulcer. Hpylori positive so started Biaxin,pepcid flagyl on 4/28.  He denies chest pain, palpitations but has noted a SOB that persists and interferes with long sentences. Has been sedentary post surgery.  Past Medical History  Diagnosis Date  . Benjamin [name] ([place]) 08/19/2011  . Polycythemia   . Anemia   . DVT (deep venous thrombosis) ([name])     hx -lt leg-dx polycythemia  . Focal dystonia   . Clotting disorder [facility])    Current Outpatient Prescriptions on File Prior to Visit  Medication Sig Dispense Refill  . clarithromycin (BIAXIN) 500 MG tablet Take 1 tablet (500 mg total) by mouth 2 (two) times daily. 28 tablet 0  . famotidine (PEPCID AC) 10 MG chewable tablet Chew 10 mg by mouth daily as needed for heartburn.     Marland Kitchen HYDROcodone-acetaminophen (NORCO/VICODIN) 5-325 MG tablet Take 1-2 tablets by mouth every 6 (six) hours as needed for moderate pain or severe pain. 40 tablet 0  . Magnesium 400 MG CAPS Take by mouth every morning.    . metroNIDAZOLE (FLAGYL) 500 MG tablet Take 1 tablet (500 mg total) by mouth 2 (two) times daily. 28 tablet 0  . Omega-3 Fatty Acids (FISH OIL) 1000 MG CAPS Take by mouth every morning.    . pantoprazole (PROTONIX) 40 MG tablet Take 1 tablet (40 mg total) by mouth 2 (two) times daily. 60 tablet 1  . sildenafil (VIAGRA) 25 MG tablet Take 2-5 tablets as needed for sexual activity 50 tablet 0  . Vitamin E 400 UNITS TABS Take by mouth every morning.     No current facility-administered medications on file prior to visit.   Allergies  Allergen Reactions  . Keflex [Cephalexin] Hives  . Morphine And Related Hives   Review of Systems  Constitutional: Positive for chills. Negative for fever.  HENT: Positive for sore throat.   Respiratory: Positive for shortness of breath. Negative for cough.   Gastrointestinal: Negative for vomiting.  Endocrine: Positive for polydipsia.  Genitourinary: Negative for dysuria.  Musculoskeletal: Positive for joint swelling, arthralgias, neck pain and neck stiffness.  Skin: Negative for rash.  Neurological: Positive for weakness.   BP 122/80 mmHg  Pulse 94  Temp(Src) 98.5 F (36.9 C) (  Oral)  Resp 18  Ht 6' 0.5" (1.842 m)  Wt 174 lb (78.926 kg)  BMI 23.26 kg/m2  SpO2 96%    Objective:   Physical Exam  Constitutional: He is oriented to person, place, and time. He appears well-developed and well-nourished. He appears distressed.  HENT:  Head: Normocephalic and atraumatic.  Mouth/Throat: Oropharyngeal exudate and posterior oropharyngeal erythema present.  Tongue appears dry. White discharge on buccal mucosa. Post phar red w/ exud  Eyes: Conjunctivae and EOM are normal. Pupils are equal, round, and reactive to light.  Neck: No thyromegaly  present.  Stiff neck with pain on any ROM  Cardiovascular: Regular rhythm and intact distal pulses.   Regular tachy at 100 No m,r,g  Pulmonary/Chest: Effort normal. No respiratory distress.  Dullness L base No wheezing or rales  Abdominal: Soft. He exhibits no distension and no mass. There is no tenderness. There is no guarding.  Musculoskeletal: Normal range of motion. He exhibits edema.  MCPs and PIPs tender and puffy. Wrist rom painful. Both shoulders are ttp with ROM restricted by pain. LE w/ Pitting edema bilaterall incl ankles but no redness or ankle tenderness  Lymphadenopathy:    He has no cervical adenopathy.  Neurological: He is alert and oriented to person, place, and time. He has normal reflexes. No cranial nerve deficit.  No sensory losses in hands or feet  Skin: Skin is warm and dry. Rash noted.  Sl red maculopap rash over face and back  Psychiatric: He has a normal mood and affect. His behavior is normal.  Nursing note and vitals reviewed. BP 122/80 mmHg  Pulse 94  Temp(Src) 98.5 F (36.9 C) (Oral)  Resp 18  Ht 6' 0.5" (1.842 m)  Wt 174 lb (78.926 kg)  BMI 23.26 kg/m2  SpO2 96%  Results for orders placed or performed in visit on 01/01/16  POCT CBC  Result Value Ref Range   WBC 31.2 (A) 4.6 - 10.2 K/uL   Lymph, poc 1.3 0.6 - 3.4   POC LYMPH PERCENT 4.3 (A) 10 - 50 %L   MID (cbc) 1.8 (A) 0 - 0.9   POC MID % 5.9 0 - 12 %M   POC Granulocyte 28.0 (A) 2 - 6.9   Granulocyte percent 89.8 (A) 37 - 80 %G   RBC 6.85 (A) 4.69 - 6.13 M/uL   Hemoglobin 14.2 14.1 - 18.1 g/dL   HCT, POC 43.2 (A) 43.5 - 53.7 %   MCV 63.0 (A) 80 - 97 fL   MCH, POC 20.7 (A) 27 - 31.2 pg   MCHC 32.8 31.8 - 35.4 g/dL   RDW, POC 19.4 %   Platelet Count, POC 223 142 - 424 K/uL   MPV 9.4 0 - 99.8 fL  POCT rapid strep A  Result Value Ref Range   Rapid Strep A Screen Negative Negative  POCT Skin KOH  Result Value Ref Range   Skin KOH, POC Negative       CXR with effusions  bilaterally Assessment & Plan:   I have completed the patient encounter in its entirety as documented by the scribe, with editing by me where necessary. Deyanira Fesler P. Laney Pastor, M.D.  SOB (shortness of breath) - Plan: DG Chest 2 View, ANA Tachycardia - Plan: EKG 12-Lead, ANA Atrial flutter, unspecified type ([place]) - Plan: Antistreptolysin O titer, T4, free  Rash and nonspecific skin eruption - Plan: POCT CBC, POCT SEDIMENTATION RATE, ANA Sore throat - Plan: POCT rapid strep A, POCT Skin KOH, Culture,  Group A Strep, ANA, Antistreptolysin O titer,  Ankle edema - Plan: Comprehensive metabolic panel, ANA Pain of both shoulder joints  Neck pain, acute  Arthritis of both hands   Bilateral Pleural Effusions L>R  S/p perforated ulcer with surgery and beginning antibiotics for HPylori 12/15/15  Underlying Benjamin [name]  Sent emergently to [facility] ER for cardiac evaluation and further diagnosis for acute arthritis/pharyngitis/rash/edema and etiology of the effusions

## 2016-01-01 NOTE — Patient Instructions (Signed)
     IF you received an x-ray today, you will receive an invoice from [facility]. Please contact  Radiology at [phone] with questions or concerns regarding your invoice.   IF you received labwork today, you will receive an invoice from [facility]. Please contact [facility] at [phone] with questions or concerns regarding your invoice.   Our billing staff will not be able to assist you with questions regarding bills from these companies.  You will be contacted with the lab results as soon as they are available. The fastest way to get your results is to activate your My Chart account. Instructions are located on the last page of this paperwork. If you have not heard from us regarding the results in 2 weeks, please contact this office.      

## 2016-01-01 NOTE — Progress Notes (Signed)
Pharmacy Antibiotic Note  Benjamin [name] is a 65 y.o. male admitted on 01/01/2016 with sore throat and neck pain ans stiffness. Concerned for meningitis.  Pharmacy has been consulted for Meropenem and Vancomycin dosing. Patient has an allergy of hives to Keflex.   WBC 31.1. Tm 102.81F, LA 3.38   Plan: -Merrem 2 gm IV Q 8 hours  -Vancomycin 1500 mg IV once followed by Vancomycin IV 750 mg IV Q 8 hours -Monitor CBC, renal fx, cultures and clinical progress -VT at SS      Temp (24hrs), Avg:100.4 F (38 C), Min:98.5 F (36.9 C), Max:102.5 F (39.2 C)   Recent Labs Lab 01/01/16 1217 01/01/16 1350 01/01/16 1423  WBC 31.2* 31.1*  --   LATICACIDVEN  --   --  3.38*    Estimated Creatinine Clearance: 91 mL/min (by C-G formula based on Cr of 0.89).    Allergies  Allergen Reactions  . Keflex [Cephalexin] Hives  . Morphine And Related Hives    Antimicrobials this admission: 5/10 Vanc>> 5/10 Merrem>>  Dose adjustments this admission: None   Microbiology results: 5/10 BCx2>> 5/10 UCx>>   Thank you for allowing pharmacy to be a part of this patient's care.  Albertina Parr, PharmD., BCPS Clinical Pharmacist Pager [phone]

## 2016-01-01 NOTE — ED Provider Notes (Signed)
CSN: [id number]     Arrival date & time 01/01/16  77 History   First MD Initiated Contact with Patient 01/01/16 1330     Chief Complaint  Patient presents with  . Weakness     (Consider location/radiation/quality/duration/timing/severity/associated sxs/prior Treatment) HPI Patient presents with several days of sore throat followed by lateral knee and wrist pain. He then developed neck pain and stiffness. Has associated headache. He now has generalized weakness and difficulty moving around. States he normally has no difficulty and runs and treadmill. Denies any focal weakness or numbness. No known fever. Patient was recently admitted for perforated stomach and discharge with 2 antibiotics. He stopped taking his antibiotics today. Denies shortness of breath, cough, vomiting or diarrhea. No chest or abdominal pain. Patient notes no new rash to the back of the right calf across thoracic back in the right side of his face. No known insect or tick bites. Past Medical History  Diagnosis Date  . 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 Polycythemia   . Anemia   . DVT (deep venous thrombosis) ([name])     hx -lt leg-dx polycythemia  . Focal dystonia   . Clotting disorder [facility])    Past Surgical History  Procedure Laterality Date  . Wisdom tooth extraction    . Finger arthroplasty  1998    rt index  . Inguinal hernia repair  2007    left  . Inguinal hernia repair  2009    right  . Colonoscopy    . Dupuytren contracture release Left 08/08/2013    Procedure: DUPUYTREN CONTRACTURE RELEASE LEFT HAND;  Surgeon: Cammie Sickle., MD;  Location: [place];  Service: Orthopedics;  Laterality: Left;  . Colonoscopy    . Hernia repair    . Laparotomy N/A 12/15/2015    Procedure: EXPLORATORY LAPAROTOMY;  Surgeon: Leighton Ruff, MD;  Location: [facility];  Service: General;  Laterality: N/A;   Family History  Problem Relation Age of Onset  . Colon cancer Neg Hx   . Heart disease Father   .  Hyperlipidemia Father   . Hypertension Father   . Diabetes Father   . Diabetes Brother   . Hyperlipidemia Brother    Social History  Substance Use Topics  . Smoking status: Never Smoker   . Smokeless tobacco: Never Used     Comment: never used tobacco  . Alcohol Use: 0.0 oz/week    0 Standard drinks or equivalent per week     Comment: almost daily wine    Review of Systems  Constitutional: Negative for fever and chills.  HENT: Positive for sore throat. Negative for ear pain and sinus pressure.   Eyes: Negative for visual disturbance.  Respiratory: Negative for cough and shortness of breath.   Cardiovascular: Positive for leg swelling. Negative for chest pain and palpitations.  Gastrointestinal: Negative for nausea, vomiting, abdominal pain and diarrhea.  Genitourinary: Negative for dysuria, frequency, hematuria and flank pain.  Musculoskeletal: Positive for joint swelling, arthralgias, neck pain and neck stiffness. Negative for myalgias.  Skin: Positive for rash. Negative for color change, pallor and wound.  Neurological: Positive for weakness and headaches. Negative for dizziness, light-headedness and numbness.  All other systems reviewed and are negative.     Allergies  Keflex and Morphine and related  Home Medications   Prior to Admission medications   Medication Sig Start Date End Date Taking? Authorizing Provider  HYDROcodone-acetaminophen (NORCO/VICODIN) 5-325 MG tablet Take 1-2 tablets by mouth every 6 (six)  hours as needed for moderate pain or severe pain. 12/20/15  Yes Emina Riebock, NP  Magnesium 400 MG CAPS Take 1 capsule by mouth daily.    Yes Historical Provider, MD  Omega-3 Fatty Acids (FISH OIL) 1000 MG CAPS Take 1 capsule by mouth daily.    Yes Historical Provider, MD  pantoprazole (PROTONIX) 40 MG tablet Take 1 tablet (40 mg total) by mouth 2 (two) times daily. 12/20/15  Yes Emina Riebock, NP  Vitamin E 400 UNITS TABS Take 1 tablet by mouth daily.    Yes  Historical Provider, MD  clarithromycin (BIAXIN) 500 MG tablet Take 1 tablet (500 mg total) by mouth 2 (two) times daily. Patient not taking: Reported on 01/01/2016 12/20/15   Erby Pian, NP  famotidine (PEPCID AC) 10 MG chewable tablet Chew 10 mg by mouth daily as needed for heartburn.    Historical Provider, MD  metroNIDAZOLE (FLAGYL) 500 MG tablet Take 1 tablet (500 mg total) by mouth 2 (two) times daily. Patient not taking: Reported on 01/01/2016 12/20/15   Erby Pian, NP  sildenafil (VIAGRA) 25 MG tablet Take 2-5 tablets as needed for sexual activity Patient not taking: Reported on 01/01/2016 01/12/14   Volanda Napoleon, MD   BP 135/67 mmHg  Pulse 113  Temp(Src) 102.5 F (39.2 C) (Rectal)  Resp 28  SpO2 93% Physical Exam  Constitutional: He is oriented to person, place, and time. He appears well-developed and well-nourished. No distress.  HENT:  Head: Normocephalic and atraumatic.  Mouth/Throat: Oropharynx is clear and moist. No oropharyngeal exudate.  Eyes: EOM are normal. Pupils are equal, round, and reactive to light.  Neck: Normal range of motion. Neck supple.  Neck stiffness noted. No lymphadenopathy. No masses  Cardiovascular: Regular rhythm.  Exam reveals no gallop and no friction rub.   No murmur heard. Tachycardia  Pulmonary/Chest: Effort normal and breath sounds normal. No respiratory distress. He has no wheezes. He has no rales. He exhibits no tenderness.  Abdominal: Soft. Bowel sounds are normal. He exhibits no distension and no mass. There is no tenderness. There is no rebound and no guarding.  Musculoskeletal: Normal range of motion. He exhibits edema. He exhibits no tenderness.  3+ bilateral pretibial pitting edema. Patient with left wrist swelling. No midline thoracic or lumbar tenderness.  Neurological: He is alert and oriented to person, place, and time.  5/5 motor in all extremities. Sensation is fully intact.  Skin: Skin is warm and dry. Rash noted. No  erythema.  Patient with macular rash to the posterior surface of the distal right leg. Darkened pigmentation. Nonblanching. Patient also has macular erythematous rash to the back and small amount to the face especially over the right cheek.  Psychiatric: He has a normal mood and affect. His behavior is normal.  Nursing note and vitals reviewed.   ED Course  .Lumbar Puncture Date/Time: 01/01/2016 3:32 PM Performed by: Julianne Rice Authorized by: Lita Mains, Nora Rooke Consent: Written consent obtained. Consent given by: spouse Patient understanding: patient states understanding of the procedure being performed Patient consent: the patient's understanding of the procedure matches consent given Site marked: the operative site was marked Imaging studies: imaging studies available Patient identity confirmed: verbally with patient and arm band Time out: Immediately prior to procedure a "time out" was called to verify the correct patient, procedure, equipment, support staff and site/side marked as required. Indications: evaluation for infection Anesthesia: local infiltration Local anesthetic: lidocaine 1% without epinephrine Anesthetic total: 4 ml Patient sedated: no Preparation: Patient was  prepped and draped in the usual sterile fashion. Lumbar space: L4-L5 interspace Patient's position: left lateral decubitus Needle gauge: 20 Needle type: spinal needle - Quincke tip Needle length: 3.5 in Number of attempts: 1 Opening pressure: 17 cm H2O Fluid appearance: clear Tubes of fluid: 4 Total volume: 4 ml Post-procedure: site cleaned and adhesive bandage applied Patient tolerance: Patient tolerated the procedure well with no immediate complications   (including critical care time) Labs Review Labs Reviewed  COMPREHENSIVE METABOLIC PANEL - Abnormal; Notable for the following:    Sodium 132 (*)    Chloride 97 (*)    CO2 21 (*)    Glucose, Bld 117 (*)    Creatinine, Ser 1.33 (*)     Calcium 8.4 (*)    Total Protein 6.3 (*)    Albumin 2.5 (*)    ALT 11 (*)    Alkaline Phosphatase 410 (*)    GFR calc non Af Amer 54 (*)    All other components within normal limits  CBC WITH DIFFERENTIAL/PLATELET - Abnormal; Notable for the following:    WBC 31.1 (*)    RBC 7.10 (*)    MCV 66.1 (*)    MCH 20.3 (*)    RDW 19.7 (*)    Neutro Abs 28.3 (*)    Lymphs Abs 0.6 (*)    Monocytes Absolute 2.2 (*)    All other components within normal limits  BRAIN NATRIURETIC PEPTIDE - Abnormal; Notable for the following:    B Natriuretic Peptide 108.3 (*)    All other components within normal limits  I-STAT CG4 LACTIC ACID, ED - Abnormal; Notable for the following:    Lactic Acid, Venous 3.38 (*)    All other components within normal limits  I-STAT TROPOININ, ED - Abnormal; Notable for the following:    Troponin i, poc 0.11 (*)    All other components within normal limits  CULTURE, BLOOD (ROUTINE X 2)  CULTURE, BLOOD (ROUTINE X 2)  URINE CULTURE  RAPID STREP SCREEN (NOT AT [facility])  CULTURE, BLOOD (ROUTINE X 2)  CULTURE, BLOOD (ROUTINE X 2)  CSF CULTURE  GRAM STAIN  HERPES SIMPLEX VIRUS CULTURE  RAPID STREP SCREEN (NOT AT [facility])  URINALYSIS, ROUTINE W REFLEX MICROSCOPIC (NOT AT [facility])  ROCKY MTN SPOTTED FVR ABS PNL(IGG+IGM)  B. BURGDORFI ANTIBODIES  CSF CELL COUNT WITH DIFFERENTIAL  CSF CELL COUNT WITH DIFFERENTIAL  GLUCOSE, CSF  PROTEIN, CSF  HERPES SIMPLEX VIRUS(HSV) DNA BY PCR  B. BURGDORFI ANTIBODIES, CSF  I-STAT CG4 LACTIC ACID, ED    Imaging Review Dg Chest 2 View  01/01/2016  CLINICAL DATA:  Fever. EXAM: CHEST  2 VIEW COMPARISON:  01/01/2016 FINDINGS: Moderate pleural effusion on the left. Small pleural effusion on the right. Non obscured cardiomediastinal contours are normal. The underlying left lower lobe and lingula are obscured. IMPRESSION: Bilateral pleural effusion, larger and moderate on the left. There could be pneumonia in the obscured left basilar lung in this  patient with history of fever. Electronically Signed   By: Monte Fantasia M.D.   On: 01/01/2016 14:41   Dg Chest 2 View  01/01/2016  CLINICAL DATA:  Shortness of breath. EXAM: CHEST  2 VIEW COMPARISON:  04/30/2006 FINDINGS: There are bilateral pleural effusions, moderate on the left and small on the right. Bibasilar atelectasis. Heart is borderline in size. No acute bony abnormality. IMPRESSION: Bilateral pleural effusions, left larger than right and bibasilar atelectasis. Electronically Signed   By: Rolm Baptise M.D.   On:  01/01/2016 12:10   Ct Head Wo Contrast  01/01/2016  CLINICAL DATA:  Stiffness in neck and joints that began today. No headache is reported. EXAM: CT HEAD WITHOUT CONTRAST TECHNIQUE: Contiguous axial images were obtained from the base of the skull through the vertex without intravenous contrast. COMPARISON:  None. FINDINGS: No evidence for acute infarction, hemorrhage, mass lesion, hydrocephalus, or extra-axial fluid. Normal for age cerebral volume. Minor hypoattenuation of white matter, likely chronic microvascular ischemic change. 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 No areas of cortical infarction. Vascular calcification. No skull base or calvarial abnormalities. Negative orbits. BILATERAL ethmoid sinus disease. No mastoid fluid. 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 No acute intracranial findings. Electronically Signed   By: Staci Righter M.D.   On: 01/01/2016 14:52   I have personally reviewed and evaluated these images and lab results as part of my medical decision-making.   EKG Interpretation   Date/Time:  Wednesday Jan 01 2016 13:48:49 EDT Ventricular Rate:  112 PR Interval:  132 QRS Duration: 109 QT Interval:  315 QTC Calculation: 430 R Axis:   20 Text Interpretation:  Sinus tachycardia RSR' in V1 or V2, right VCD or RVH  Anteroseptal infarct, old Confirmed by Lita Mains  MD, Feleica Fulmore ([id number]) on   01/01/2016 3:36:22 PM     CRITICAL CARE Performed by: Lita Mains, Jakeline Dave Total critical care time: 30 minutes Critical care time was exclusive of separately billable procedures and treating other patients. Critical care was necessary to treat or prevent imminent or life-threatening deterioration. Critical care was time spent personally by me on the following activities: development of treatment plan with patient and/or surrogate as well as nursing, discussions with consultants, evaluation of patient's response to treatment, examination of patient, obtaining history from patient or surrogate, ordering and performing treatments and interventions, ordering and review of laboratory studies, ordering and review of radiographic studies, pulse oximetry and re-evaluation of patient's condition.  MDM   Final diagnoses:  Sepsis, due to unspecified organism [facility])  Arthralgia  Rash  Neck stiffness   Patient meets criteria for sepsis. Certainly for possible meningitis. Started on broad-spectrum antibiotics and IV fluids. Consent has been obtained for LP.    LP in the emergency department. Clear CSF. Sent for CSF studies and culture. Will discuss with hospitalist and infectious disease.  Discussed with [facility]. Will admit to step down bed.  Julianne Rice, MD 01/01/16 [phone]

## 2016-01-01 NOTE — ED Notes (Signed)
Patient transported to X-ray 

## 2016-01-01 NOTE — H&P (Signed)
TRH H&P   Patient Demographics:    Benjamin [name], is a 65 y.o. male  MRN: [id number]   DOB - 08-18-1951  Admit Date - 01/01/2016  Outpatient Primary MD for the patient is Geoffery Lyons, MD  Referring MD: Dr. Lita Mains  Outpatient: Dr. Marin Olp  Patient coming from: Home  Chief Complaint  Patient presents with  . Weakness      HPI:    Benjamin [name]  is a 65 y.o. male, with h/o of polycethemia recent history of perforated bowel which patient underwent operation by general surgery. He was subsequently discharged home and he reports finishing antibiotic yesterday. He reports that within the last few days he has developed joint aches with it starting at his index fingers on both hands. Since then he reports stiffness in his neck. He denies any headaches.    Review of systems:    In addition to the HPI above,  + Fever-chills, No Headache, No changes with Vision or hearing, No problems swallowing food or Liquids, No Chest pain, Cough or Shortness of Breath, No Abdominal pain, No Nausea or Vommitting, Bowel movements are regular, No Blood in stool or Urine, No dysuria, + new skin rashes or bruises, + new joints pains-aches,  + new weakness, no tingling, numbness in any extremity, No recent weight gain or loss, No polyuria, polydypsia or polyphagia, No significant Mental Stressors.  A full 10 point Review of Systems was done, except as stated above, all other Review of Systems were negative.   With Past History of the following :    Past Medical History  Diagnosis Date  . 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 Polycythemia   . Anemia   . DVT (deep venous thrombosis) ([name])     hx -lt leg-dx polycythemia  . Focal dystonia   . Clotting disorder [facility])       Past Surgical History  Procedure Laterality Date  . Wisdom tooth extraction    . Finger arthroplasty  1998   rt index  . Inguinal hernia repair  2007    left  . Inguinal hernia repair  2009    right  . Colonoscopy    . Dupuytren contracture release Left 08/08/2013    Procedure: DUPUYTREN CONTRACTURE RELEASE LEFT HAND;  Surgeon: Cammie Sickle., MD;  Location: [place];  Service: Orthopedics;  Laterality: Left;  . Colonoscopy    . Hernia repair    . Laparotomy N/A 12/15/2015    Procedure: EXPLORATORY LAPAROTOMY;  Surgeon: Leighton Ruff, MD;  Location: [facility];  Service: General;  Laterality: N/A;      Social History:     Social History  Substance Use Topics  . Smoking status: Never Smoker   . Smokeless tobacco: Never Used     Comment: never used tobacco  . Alcohol Use: 0.0 oz/week    0 Standard drinks or equivalent  per week     Comment: almost daily wine     Lives - At home with-wife  Mobility - limited    Family History :     Family History  Problem Relation Age of Onset  . Colon cancer Neg Hx   . Heart disease Father   . Hyperlipidemia Father   . Hypertension Father   . Diabetes Father   . Diabetes Brother   . Hyperlipidemia Brother      Home Medications:   Prior to Admission medications   Medication Sig Start Date End Date Taking? Authorizing Provider  HYDROcodone-acetaminophen (NORCO/VICODIN) 5-325 MG tablet Take 1-2 tablets by mouth every 6 (six) hours as needed for moderate pain or severe pain. 12/20/15  Yes Emina Riebock, NP  Magnesium 400 MG CAPS Take 1 capsule by mouth daily.    Yes Historical Provider, MD  Omega-3 Fatty Acids (FISH OIL) 1000 MG CAPS Take 1 capsule by mouth daily.    Yes Historical Provider, MD  pantoprazole (PROTONIX) 40 MG tablet Take 1 tablet (40 mg total) by mouth 2 (two) times daily. 12/20/15  Yes Emina Riebock, NP  Vitamin E 400 UNITS TABS Take 1 tablet by mouth daily.    Yes Historical Provider, MD  clarithromycin (BIAXIN) 500 MG tablet Take 1 tablet (500 mg total) by mouth 2 (two) times daily. Patient not taking:  Reported on 01/01/2016 12/20/15   Erby Pian, NP  famotidine (PEPCID AC) 10 MG chewable tablet Chew 10 mg by mouth daily as needed for heartburn.    Historical Provider, MD  metroNIDAZOLE (FLAGYL) 500 MG tablet Take 1 tablet (500 mg total) by mouth 2 (two) times daily. Patient not taking: Reported on 01/01/2016 12/20/15   Erby Pian, NP  sildenafil (VIAGRA) 25 MG tablet Take 2-5 tablets as needed for sexual activity Patient not taking: Reported on 01/01/2016 01/12/14   Volanda Napoleon, MD     Allergies:     Allergies  Allergen Reactions  . Keflex [Cephalexin] Hives  . Morphine And Related Hives     Physical Exam:   Vitals  Blood pressure 146/71, pulse 118, temperature 102.5 F (39.2 C), temperature source Rectal, resp. rate 33, SpO2 94 %.   1. General : Patient in no acute distress, alert and awake  2. Normal affect and insight, Not Suicidal or Homicidal, Awake Alert, Oriented X 3.  3. No facial asymmetry, answers questions appropriately  4. Ears and Eyes appear Normal, Conjunctivae clear, PERRLA. Moist Oral Mucosa.  5. stiff Neck, No JVD, No cervical lymphadenopathy appriciated, No Carotid Bruits.  6. Symmetrical Chest wall movement, Good air movement bilaterally, CTAB.  7. RRR, No Gallops, Rubs or Murmurs, No Parasternal Heave.  8. Positive Bowel Sounds, Abdomen Soft, No tenderness, No organomegaly appriciated,No rebound -guarding or rigidity.  9.  No Cyanosis, Normal Skin Turgor, No Skin Rash or Bruise.  10. Good muscle tone,  joints appear normal , no effusions, Normal ROM.  11. No Palpable Lymph Nodes in Neck or Axillae    Data Review:    CBC  Recent Labs Lab 01/01/16 1217 01/01/16 1350  WBC 31.2* 31.1*  HGB 14.2 14.4  HCT 43.2* 46.9  PLT  --  150  MCV 63.0* 66.1*  MCH 20.7* 20.3*  MCHC 32.8 30.7  RDW  --  19.7*  LYMPHSABS  --  0.6*  MONOABS  --  2.2*  EOSABS  --  0.0  BASOSABS  --  0.0    ------------------------------------------------------------------------------------------------------------------  Chemistries  Recent Labs Lab 01/01/16 1350  NA 132*  K 4.4  CL 97*  CO2 21*  GLUCOSE 117*  BUN 18  CREATININE 1.33*  CALCIUM 8.4*  AST 18  ALT 11*  ALKPHOS 410*  BILITOT 1.0   ------------------------------------------------------------------------------------------------------------------ estimated creatinine clearance is 60.9 mL/min (by C-G formula based on Cr of 1.33). ------------------------------------------------------------------------------------------------------------------ No results for input(s): TSH, T4TOTAL, T3FREE, THYROIDAB in the last 72 hours.  Invalid input(s): FREET3  Coagulation profile No results for input(s): INR, PROTIME in the last 168 hours. ------------------------------------------------------------------------------------------------------------------- No results for input(s): DDIMER in the last 72 hours. -------------------------------------------------------------------------------------------------------------------  Cardiac Enzymes No results for input(s): CKMB, TROPONINI, MYOGLOBIN in the last 168 hours.  Invalid input(s): CK ------------------------------------------------------------------------------------------------------------------    Component Value Date/Time   BNP 108.3* 01/01/2016 1350     ---------------------------------------------------------------------------------------------------------------  Urinalysis    Component Value Date/Time   COLORURINE YELLOW 01/01/2016 [place] 01/01/2016 1716   LABSPEC 1.020 01/01/2016 1716   PHURINE 5.5 01/01/2016 [place] 01/01/2016 1716   HGBUR MODERATE* 01/01/2016 1716   [place] 01/01/2016 [place] 01/01/2016 1716   PROTEINUR 30* 01/01/2016 1716   NITRITE NEGATIVE 01/01/2016 [place] 01/01/2016 1716    ----------------------------------------------------------------------------------------------------------------   Imaging Results:    Dg Chest 2 View  01/01/2016  CLINICAL DATA:  Fever. EXAM: CHEST  2 VIEW COMPARISON:  01/01/2016 FINDINGS: Moderate pleural effusion on the left. Small pleural effusion on the right. Non obscured cardiomediastinal contours are normal. The underlying left lower lobe and lingula are obscured. IMPRESSION: Bilateral pleural effusion, larger and moderate on the left. There could be pneumonia in the obscured left basilar lung in this patient with history of fever. Electronically Signed   By: Monte Fantasia M.D.   On: 01/01/2016 14:41   Dg Chest 2 View  01/01/2016  CLINICAL DATA:  Shortness of breath. EXAM: CHEST  2 VIEW COMPARISON:  04/30/2006 FINDINGS: There are bilateral pleural effusions, moderate on the left and small on the right. Bibasilar atelectasis. Heart is borderline in size. No acute bony abnormality. IMPRESSION: Bilateral pleural effusions, left larger than right and bibasilar atelectasis. Electronically Signed   By: Rolm Baptise M.D.   On: 01/01/2016 12:10   Ct Head Wo Contrast  01/01/2016  CLINICAL DATA:  Stiffness in neck and joints that began today. No headache is reported. EXAM: CT HEAD WITHOUT CONTRAST TECHNIQUE: Contiguous axial images were obtained from the base of the skull through the vertex without intravenous contrast. COMPARISON:  None. FINDINGS: No evidence for acute infarction, hemorrhage, mass lesion, hydrocephalus, or extra-axial fluid. Normal for age cerebral volume. Minor hypoattenuation of white matter, likely chronic microvascular ischemic change. 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 No areas of cortical infarction. Vascular calcification. No skull base or calvarial abnormalities. Negative orbits. BILATERAL ethmoid sinus disease. No mastoid fluid. IMPRESSION: Chronic LEFT  caudate and deep white matter infarct. No acute intracranial findings. Electronically Signed   By: Staci Righter M.D.   On: 01/01/2016 14:52    EKG: Sinus Tachycardia, no ST elevation or Depressions   Assessment & Plan:    Active Problems:   Sepsis ([name])/Leukocytosis - Etiology uncertain at this point. Workup for rheumatoid arthritis and obtain routine lab workup. - ID consulted for further evaluation recommendations given reports of stiffness of his neck - LP performed in the ED - chest x ray Bl pleural effusion, larger and moderate on the left. Reports that  it could be pna in the obscrued left basilar lung. - CT head reports no acute intracranial findings. 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  Code Status full   Condition GUARDED    Consults called:  ID: Dr. Johnnye Sima  Admission status: inpatient   Time spent in minutes : > 60 minutes   Velvet Bathe M.D on 01/01/2016 at 5:57 PM  Between 7am to 7pm - Pager - [phone]. After 7pm go to [email or web address] - password [facility]  [facility] - Office  [phone]

## 2016-01-01 NOTE — ED Notes (Signed)
Pt arrives from [facility] via [facility]. Pt went to UC to be evaluated for generalized weakness, bilateral leg edema, left hand edema, joint pain and sore throat. The pt is currently being treated for h pylori and had abdominal surgery on 12/15/15. Pt states he has been having a lot of post op pain and has also developed a strange rash to the right side of his face.

## 2016-01-02 ENCOUNTER — Encounter (HOSPITAL_COMMUNITY): Payer: Self-pay

## 2016-01-02 DIAGNOSIS — D45 Polycythemia vera: Secondary | ICD-10-CM

## 2016-01-02 LAB — CBC
HEMATOCRIT: 43.4 % (ref 39.0–52.0)
Hemoglobin: 12.9 g/dL — ABNORMAL LOW (ref 13.0–17.0)
MCH: 19.6 pg — AB (ref 26.0–34.0)
MCHC: 29.7 g/dL — AB (ref 30.0–36.0)
MCV: 66.1 fL — AB (ref 78.0–100.0)
Platelets: 104 10*3/uL — ABNORMAL LOW (ref 150–400)
RBC: 6.57 MIL/uL — ABNORMAL HIGH (ref 4.22–5.81)
RDW: 19.1 % — AB (ref 11.5–15.5)
WBC: 29.4 10*3/uL — ABNORMAL HIGH (ref 4.0–10.5)

## 2016-01-02 LAB — URINE CULTURE

## 2016-01-02 LAB — HIV ANTIBODY (ROUTINE TESTING W REFLEX): HIV SCREEN 4TH GENERATION: NONREACTIVE

## 2016-01-02 LAB — ROCKY MTN SPOTTED FVR ABS PNL(IGG+IGM)
RMSF IGG: NEGATIVE
RMSF IgM: 1.49 index — ABNORMAL HIGH (ref 0.00–0.89)

## 2016-01-02 LAB — BASIC METABOLIC PANEL
Anion gap: 11 (ref 5–15)
BUN: 18 mg/dL (ref 6–20)
CHLORIDE: 103 mmol/L (ref 101–111)
CO2: 23 mmol/L (ref 22–32)
Calcium: 8.2 mg/dL — ABNORMAL LOW (ref 8.9–10.3)
Creatinine, Ser: 0.88 mg/dL (ref 0.61–1.24)
GFR calc Af Amer: >60 mL/min (ref >60)
GLUCOSE: 150 mg/dL — AB (ref 65–99)
POTASSIUM: 4.1 mmol/L (ref 3.5–5.1)
Sodium: 137 mmol/L (ref 135–145)

## 2016-01-02 LAB — MRSA PCR SCREENING: MRSA BY PCR: NEGATIVE

## 2016-01-02 LAB — MAGNESIUM: MAGNESIUM: 2 mg/dL (ref 1.7–2.4)

## 2016-01-02 LAB — COMPREHENSIVE METABOLIC PANEL
ALBUMIN: 2.8 g/dL — AB (ref 3.6–5.1)
ALK PHOS: 473 U/L — AB (ref 40–115)
ALT: 9 U/L (ref 9–46)
AST: 13 U/L (ref 10–35)
BUN: 18 mg/dL (ref 7–25)
CALCIUM: 7.9 mg/dL — AB (ref 8.6–10.3)
CO2: 17 mmol/L — ABNORMAL LOW (ref 20–31)
Chloride: 96 mmol/L — ABNORMAL LOW (ref 98–110)
Creat: 1.14 mg/dL (ref 0.70–1.25)
GLUCOSE: 110 mg/dL — AB (ref 65–99)
Potassium: 4.3 mmol/L (ref 3.5–5.3)
Sodium: 132 mmol/L — ABNORMAL LOW (ref 135–146)
Total Bilirubin: 0.7 mg/dL (ref 0.2–1.2)
Total Protein: 5.4 g/dL — ABNORMAL LOW (ref 6.1–8.1)

## 2016-01-02 LAB — HERPES SIMPLEX VIRUS(HSV) DNA BY PCR
HSV 1 DNA: NEGATIVE
HSV 2 DNA: NEGATIVE

## 2016-01-02 LAB — PHOSPHORUS: Phosphorus: 4.1 mg/dL (ref 2.5–4.6)

## 2016-01-02 LAB — B. BURGDORFI ANTIBODIES

## 2016-01-02 LAB — ANA: ANA: NEGATIVE

## 2016-01-02 MED ORDER — HEPARIN SODIUM (PORCINE) 5000 UNIT/ML IJ SOLN
5000.0000 [IU] | Freq: Three times a day (TID) | INTRAMUSCULAR | Status: DC
  Administered 2016-01-02 – 2016-01-04 (×7): 5000 [IU] via SUBCUTANEOUS
  Filled 2016-01-02 (×7): qty 1

## 2016-01-02 MED ORDER — ACETAMINOPHEN 650 MG RE SUPP: 650.0000 mg | Freq: Four times a day (QID) | RECTAL | Status: DC | PRN

## 2016-01-02 MED ORDER — ACETAMINOPHEN 325 MG PO TABS: 650.0000 mg | ORAL_TABLET | Freq: Four times a day (QID) | ORAL | Status: DC | PRN

## 2016-01-02 MED ORDER — SODIUM CHLORIDE 0.9% FLUSH
3.0000 mL | Freq: Two times a day (BID) | INTRAVENOUS | Status: DC
  Administered 2016-01-02 – 2016-01-04 (×2): 3 mL via INTRAVENOUS

## 2016-01-02 MED ORDER — SODIUM CHLORIDE 0.9% FLUSH: 3.0000 mL | INTRAVENOUS | Status: DC | PRN

## 2016-01-02 MED ORDER — OMEGA-3-ACID ETHYL ESTERS 1 G PO CAPS
1.0000 g | ORAL_CAPSULE | Freq: Every day | ORAL | Status: DC
  Administered 2016-01-02 – 2016-01-04 (×3): 1 g via ORAL
  Filled 2016-01-02 (×3): qty 1

## 2016-01-02 MED ORDER — SODIUM CHLORIDE 0.9% FLUSH
3.0000 mL | Freq: Two times a day (BID) | INTRAVENOUS | Status: DC
  Administered 2016-01-02 – 2016-01-03 (×3): 3 mL via INTRAVENOUS

## 2016-01-02 MED ORDER — SODIUM CHLORIDE 0.9 % IV SOLN: 250.0000 mL | INTRAVENOUS | Status: DC | PRN

## 2016-01-02 NOTE — Progress Notes (Signed)
PROGRESS NOTE                                                                                                                                                                                                             Patient Demographics:    Benjamin [name], is a 65 y.o. male, DOB - [birth year], XQ:[id number]  Admit date - 01/01/2016   Admitting Physician Velvet Bathe, MD  Outpatient Primary MD for the patient is Geoffery Lyons, MD  LOS - 1  Outpatient Specialists Dr. Marin Olp  Chief Complaint  Patient presents with  . Weakness       Brief Narrative   65 y.o. male, with h/o of polycethemia recent history of perforated bowel which patient underwent operation by general surgery. He was subsequently discharged home and he reports finishing antibiotic yesterday. He reports that within the last few days he has developed joint aches with it starting at his index fingers on both hands. Since then he reports stiffness in his neck.   Subjective:    [name] today he has no new complaints reported.    Assessment  & Plan :    Active Problems:   SIRs ([name])/ Leukocytosis -.RA work up pending - ID on board and currently working up for infectious etiology - Peripheral smear ordered. Should work up for both be negative will consult Dr. Marin Olp to see from an oncological standpoint if there is anything that may be contributing to current clinical scenario given history of polycythemia - sirs physiology resolving  Lactic acidosis  - resolved  Code Status : full  Family Communication  : d/c patient directly  Disposition Plan  : Pending improvement in condition  Barriers For Discharge : pending work up and investigational studies  Consults  :  ID  Procedures  : LP  DVT Prophylaxis  :  Heparin  Lab Results  Component Value Date   PLT 104* 01/02/2016    Antibiotics  :  Meropenem and  Vancomycin  Anti-infectives    Start     Dose/Rate Route Frequency Ordered Stop   01/01/16 2300  meropenem (MERREM) 2 g in sodium chloride 0.9 % 100 mL IVPB     2 g 200 mL/hr over 30 Minutes Intravenous Every 8 hours 01/01/16 1445     01/01/16 2300  vancomycin (VANCOCIN) IVPB 750 mg/150 ml  premix     750 mg 150 mL/hr over 60 Minutes Intravenous Every 8 hours 01/01/16 1445     01/01/16 1445  meropenem (MERREM) 2 g in sodium chloride 0.9 % 100 mL IVPB     2 g 200 mL/hr over 30 Minutes Intravenous  Once 01/01/16 1432 01/01/16 1710   01/01/16 1445  vancomycin (VANCOCIN) IVPB 1000 mg/200 mL premix  Status:  Discontinued     1,000 mg 200 mL/hr over 60 Minutes Intravenous  Once 01/01/16 1432 01/01/16 1435   01/01/16 1445  vancomycin (VANCOCIN) 1,500 mg in sodium chloride 0.9 % 500 mL IVPB     1,500 mg 250 mL/hr over 120 Minutes Intravenous  Once 01/01/16 1435 01/01/16 1739        Objective:   Filed Vitals:   01/02/16 1000 01/02/16 1100 01/02/16 1200 01/02/16 1600  BP: 136/70 128/70 123/70 123/65  Pulse: 98 94 87 85  Temp:   97.8 F (36.6 C)   TempSrc:   Oral   Resp: 28 27 22 24   Height:      Weight:      SpO2: 95% 94% 94% 96%    Wt Readings from Last 3 Encounters:  01/02/16 79.4 kg (175 lb 0.7 oz)  01/01/16 78.926 kg (174 lb)  12/15/15 74.844 kg (165 lb)     Intake/Output Summary (Last 24 hours) at 01/02/16 1637 Last data filed at 01/02/16 1513  Gross per 24 hour  Intake   1840 ml  Output    400 ml  Net   1440 ml     Physical Exam  Awake Alert, Oriented X 3, No new F.N deficits, Normal affect Supple Neck,No JVD, No cervical lymphadenopathy appreciated.  Symmetrical Chest wall movement, Good air movement bilaterally, CTAB RRR,No Gallops,Rubs or new Murmurs,  +ve B.Sounds, Abd Soft, No tenderness, No organomegaly appriciated, No rebound - guarding or rigidity. No Cyanosis    Data Review:    CBC  Recent Labs Lab 01/01/16 1217 01/01/16 1350 01/02/16 0720   WBC 31.2* 31.1* 29.4*  HGB 14.2 14.4 12.9*  HCT 43.2* 46.9 43.4  PLT  --  150 104*  MCV 63.0* 66.1* 66.1*  MCH 20.7* 20.3* 19.6*  MCHC 32.8 30.7 29.7*  RDW  --  19.7* 19.1*  LYMPHSABS  --  0.6*  --   MONOABS  --  2.2*  --   EOSABS  --  0.0  --   BASOSABS  --  0.0  --     Chemistries   Recent Labs Lab 01/01/16 1214 01/01/16 1350 01/02/16 0720  NA 132* 132* 137  K 4.3 4.4 4.1  CL 96* 97* 103  CO2 17* 21* 23  GLUCOSE 110* 117* 150*  BUN 18 18 18   CREATININE 1.14 1.33* 0.88  CALCIUM 7.9* 8.4* 8.2*  MG  --   --  2.0  AST 13 18  --   ALT 9 11*  --   ALKPHOS 473* 410*  --   BILITOT 0.7 1.0  --    ------------------------------------------------------------------------------------------------------------------ No results for input(s): CHOL, HDL, LDLCALC, TRIG, CHOLHDL, LDLDIRECT in the last 72 hours.  No results found for: HGBA1C ------------------------------------------------------------------------------------------------------------------ No results for input(s): TSH, T4TOTAL, T3FREE, THYROIDAB in the last 72 hours.  Invalid input(s): FREET3 ------------------------------------------------------------------------------------------------------------------ No results for input(s): VITAMINB12, FOLATE, FERRITIN, TIBC, IRON, RETICCTPCT in the last 72 hours.  Coagulation profile No results for input(s): INR, PROTIME in the last 168 hours.  No results for input(s): DDIMER in the last 72  hours.  Cardiac Enzymes No results for input(s): CKMB, TROPONINI, MYOGLOBIN in the last 168 hours.  Invalid input(s): CK ------------------------------------------------------------------------------------------------------------------    Component Value Date/Time   BNP 108.3* 01/01/2016 1350    Inpatient Medications  Scheduled Meds: . heparin  5,000 Units Subcutaneous Q8H  . meropenem (MERREM) IV  2 g Intravenous Q8H  . omega-3 acid ethyl esters  1 g Oral Daily  . sodium  chloride flush  3 mL Intravenous Q12H  . sodium chloride flush  3 mL Intravenous Q12H  . vancomycin  750 mg Intravenous Q8H   Continuous Infusions:  PRN Meds:.sodium chloride, acetaminophen **OR** acetaminophen, oxyCODONE, sodium chloride flush  Micro Results Recent Results (from the past 240 hour(s))  Blood Culture (routine x 2)     Status: None (Preliminary result)   Collection Time: 01/01/16  2:00 PM  Result Value Ref Range Status   Specimen Description BLOOD RIGHT ANTECUBITAL  Final   Special Requests   Final    BOTTLES DRAWN AEROBIC AND ANAEROBIC 10CC AER 5CC ANA   Culture NO GROWTH 1 DAY  Final   Report Status PENDING  Incomplete  Blood Culture (routine x 2)     Status: None (Preliminary result)   Collection Time: 01/01/16  2:10 PM  Result Value Ref Range Status   Specimen Description BLOOD RIGHT HAND  Final   Special Requests BOTTLES DRAWN AEROBIC ONLY 10CC  Final   Culture NO GROWTH 1 DAY  Final   Report Status PENDING  Incomplete  CSF culture     Status: None (Preliminary result)   Collection Time: 01/01/16  3:30 PM  Result Value Ref Range Status   Specimen Description CSF  Final   Special Requests NONE  Final   Gram Stain   Final    DIRECT SMEAR DEGENERATED CELLULAR MATERIAL PRESENT NO WBC SEEN NO ORGANISMS SEEN    Culture NO GROWTH < 24 HOURS  Final   Report Status PENDING  Incomplete  Rapid strep screen (not at [facility])     Status: None   Collection Time: 01/01/16  3:32 PM  Result Value Ref Range Status   Streptococcus, Group A Screen (Direct) NEGATIVE NEGATIVE Final    Comment: (NOTE) A Rapid Antigen test may result negative if the antigen level in the sample is below the detection level of this test. The FDA has not cleared this test as a stand-alone test therefore the rapid antigen negative result has reflexed to a Group A Strep culture.   Culture, group A strep     Status: None (Preliminary result)   Collection Time: 01/01/16  3:32 PM  Result Value Ref  Range Status   Specimen Description THROAT  Final   Special Requests NONE  Final   Culture TOO YOUNG TO READ  Final   Report Status PENDING  Incomplete  Urine culture     Status: Abnormal   Collection Time: 01/01/16  5:16 PM  Result Value Ref Range Status   Specimen Description URINE, CLEAN CATCH  Final   Special Requests NONE  Final   Culture 9,000 COLONIES/mL INSIGNIFICANT GROWTH (A)  Final   Report Status 01/02/2016 FINAL  Final  MRSA PCR Screening     Status: None   Collection Time: 01/02/16  3:46 AM  Result Value Ref Range Status   MRSA by PCR NEGATIVE NEGATIVE Final    Comment:        The GeneXpert MRSA Assay (FDA approved for NASAL specimens only), is one component of  a comprehensive MRSA colonization surveillance program. It is not intended to diagnose MRSA infection nor to guide or monitor treatment for MRSA infections.     Radiology Reports Dg Chest 2 View  01/01/2016  CLINICAL DATA:  Fever. EXAM: CHEST  2 VIEW COMPARISON:  01/01/2016 FINDINGS: Moderate pleural effusion on the left. Small pleural effusion on the right. Non obscured cardiomediastinal contours are normal. The underlying left lower lobe and lingula are obscured. IMPRESSION: Bilateral pleural effusion, larger and moderate on the left. There could be pneumonia in the obscured left basilar lung in this patient with history of fever. Electronically Signed   By: Monte Fantasia M.D.   On: 01/01/2016 14:41   Dg Chest 2 View  01/01/2016  CLINICAL DATA:  Shortness of breath. EXAM: CHEST  2 VIEW COMPARISON:  04/30/2006 FINDINGS: There are bilateral pleural effusions, moderate on the left and small on the right. Bibasilar atelectasis. Heart is borderline in size. No acute bony abnormality. IMPRESSION: Bilateral pleural effusions, left larger than right and bibasilar atelectasis. Electronically Signed   By: Rolm Baptise M.D.   On: 01/01/2016 12:10   Ct Head Wo Contrast  01/01/2016  CLINICAL DATA:  Stiffness in neck  and joints that began today. No headache is reported. EXAM: CT HEAD WITHOUT CONTRAST TECHNIQUE: Contiguous axial images were obtained from the base of the skull through the vertex without intravenous contrast. COMPARISON:  None. FINDINGS: No evidence for acute infarction, hemorrhage, mass lesion, hydrocephalus, or extra-axial fluid. Normal for age cerebral volume. Minor hypoattenuation of white matter, likely chronic microvascular ischemic change. 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 No areas of cortical infarction. Vascular calcification. No skull base or calvarial abnormalities. Negative orbits. BILATERAL ethmoid sinus disease. No mastoid fluid. 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 No acute intracranial findings. Electronically Signed   By: Staci Righter M.D.   On: 01/01/2016 14:52   US Abdomen Complete  12/15/2015  CLINICAL DATA:  Right upper quadrant pain for 1 day. EXAM: ABDOMEN ULTRASOUND COMPLETE COMPARISON:  Ultrasound 12/14/2014 FINDINGS: Gallbladder: Physiologically distended. The previous edematous gallbladder wall has resolved. Wall thickness currently normal at 3 mm. No sonographic Murphy sign noted by sonographer. Common bile duct: Diameter: 5.4 mm. Liver: No focal lesion identified. Within normal limits in parenchymal echogenicity. Normal directional flow in the main portal vein. IVC: No abnormality visualized. Pancreas: Visualized portion unremarkable. Spleen: Spleen is enlarged measuring 12.7 x 17.3 x 9.9 cm for a volume of 1137 cc. Right Kidney: Length: 11.2 cm. Echogenicity within normal limits. No mass or hydronephrosis visualized. Left Kidney: Length: 10.3 cm. Echogenicity within normal limits. No mass or hydronephrosis visualized. Abdominal aorta: No aneurysm visualized. Other findings: Small volume of ascites. IMPRESSION: 1. Splenomegaly. 2. Small volume intra-abdominal ascites. 3. Resolution of previous gallbladder wall  thickening. Gallbladder appears normal. Electronically Signed   By: Jeb Levering M.D.   On: 12/15/2015 00:38   Dg Hand 2 View Right  01/01/2016  CLINICAL DATA:  Bilateral hand pain, fever EXAM: RIGHT HAND - 2 VIEW COMPARISON:  None. FINDINGS: No fracture or dislocation is seen. Possible marginal erosion along the radial aspect of the 3rd PIP joint, equivocal. Joint spaces are otherwise preserved. Mild soft tissue swelling involving the 2nd and 3rd digits. No radiopaque foreign body is seen. IMPRESSION: Possible marginal erosion at the 3rd PIP joint, although equivocal. Inflammatory arthropathy is not excluded. No fracture or dislocation is seen. Electronically Signed   By: Henderson Newcomer.D.  On: 01/01/2016 18:56   Dg Hand 2 View Left  01/01/2016  CLINICAL DATA:  Multifocal joint pain in both hands for 2 days. No known injury. Initial encounter. EXAM: LEFT HAND - 2 VIEW COMPARISON:  None. FINDINGS: There is no evidence of fracture or dislocation. The PIP joint of the left little finger is in flexion, presumably positional. There is no evidence of arthropathy or other focal bone abnormality. Soft tissues are unremarkable. IMPRESSION: No acute abnormality. Electronically Signed   By: Inge Rise M.D.   On: 01/01/2016 18:52   Ct Angio Abd/pel W/ And/or W/o  12/15/2015  CLINICAL DATA:  Right flank and right upper quadrant pain. Onset yesterday. Vomiting. EXAM: CTA ABDOMEN AND PELVIS wITHOUT AND WITH CONTRAST TECHNIQUE: Multidetector CT imaging of the abdomen and pelvis was performed using the standard protocol during bolus administration of intravenous contrast. Multiplanar reconstructed images and MIPs were obtained and reviewed to evaluate the vascular anatomy. CONTRAST:  100 cc Isovue 370 IV COMPARISON:  Abdominal ultrasound earlier this day. FINDINGS: Lower chest: Small bilateral pleural effusions, right greater than left with adjacent atelectasis. Liver: No focal lesion. Hepatobiliary:  Gallbladder physiologically distended, no calcified stone. No biliary dilatation. Pancreas: Normal. Spleen: Enlarged measuring 19.1 cm craniocaudal. Pre-pyloric gastric wall thickening. Adrenal glands: No nodule. Kidneys: Symmetric renal enhancement.  No hydronephrosis. Stomach/Bowel: Stomach physiologically distended. There are no dilated or thickened small bowel loops. There is mild wall thickening involving the ascending colon. Small volume of stool throughout the remainder the colon. The appendix is normal. Aorta: Normal in caliber without dissection, aneurysm, or periaortic soft tissue stranding. No significant atherosclerosis. Celiac, superior mesenteric, and inferior mesenteric arteries are patent. Single bilateral renal arteries are patent. Both common and external iliac artery systems are widely patent. Vascular/Lymphatic: There is no mesenteric or portal venous gas. Splenic vein is prominent in size. There is questionably eccentric low-density within left greater than right external iliac and left common iliac veins. No retroperitoneal adenopathy. Abdominal aorta is normal in caliber. Reproductive: Prominent sized prostate gland. Bladder: Physiologically distended without wall thickening. Other: Moderate volume of free air and free fluid in the abdomen. Free air is seen anteriorly in the upper, mid, and lesser extent lower abdomen with air tracking along the undersurface of the liver and spleen. Small volume of free fluid throughout the abdomen and pelvis. No loculated abscess. Fat in the right inguinal canal. Musculoskeletal: There are no acute or suspicious osseous abnormalities. Review of the MIP images confirms the above findings. IMPRESSION: 1. Perforated viscus with free air and free fluid in the abdomen. Upper GI source is strongly favored with gastric wall thickening in the pre pyloric region. 2. Splenomegaly, patient with history of polcythemia. 3. Questionable thrombus in left greater than right  external and left common iliac veins, this appears eccentic. Unclear whether this represents DVT, likely chronic, versus venous mixing secondary to phase of contrast. Correlation with clinical history recommended, duplex evaluation of the lower extremities may be of value based on clinical grounds. Critical Value/emergent results were called by telephone at the time of interpretation on 12/15/2015 at 3:21 am to Dr. Stark Jock, who verbally acknowledged these results. Electronically Signed   By: Jeb Levering M.D.   On: 12/15/2015 03:28    Time Spent in minutes  25   Velvet Bathe M.D on 01/02/2016 at 4:37 PM  Between 7am to 7pm - Pager - [phone]  After 7pm go to [email or web address] - password [facility]  [facility] -  Office  [phone]

## 2016-01-02 NOTE — Progress Notes (Signed)
INFECTIOUS DISEASE PROGRESS NOTE  ID: Benjamin [name] is a 65 y.o. male with  Active Problems:   Sepsis ([place])   Leukocytosis  Subjective: Feels better  Abtx:  Anti-infectives    Start     Dose/Rate Route Frequency Ordered Stop   01/01/16 2300  meropenem (MERREM) 2 g in sodium chloride 0.9 % 100 mL IVPB     2 g 200 mL/hr over 30 Minutes Intravenous Every 8 hours 01/01/16 1445     01/01/16 2300  vancomycin (VANCOCIN) IVPB 750 mg/150 ml premix     750 mg 150 mL/hr over 60 Minutes Intravenous Every 8 hours 01/01/16 1445     01/01/16 1445  meropenem (MERREM) 2 g in sodium chloride 0.9 % 100 mL IVPB     2 g 200 mL/hr over 30 Minutes Intravenous  Once 01/01/16 1432 01/01/16 1710   01/01/16 1445  vancomycin (VANCOCIN) IVPB 1000 mg/200 mL premix  Status:  Discontinued     1,000 mg 200 mL/hr over 60 Minutes Intravenous  Once 01/01/16 1432 01/01/16 1435   01/01/16 1445  vancomycin (VANCOCIN) 1,500 mg in sodium chloride 0.9 % 500 mL IVPB     1,500 mg 250 mL/hr over 120 Minutes Intravenous  Once 01/01/16 1435 01/01/16 1739      Medications:  Scheduled: . heparin  5,000 Units Subcutaneous Q8H  . meropenem (MERREM) IV  2 g Intravenous Q8H  . omega-3 acid ethyl esters  1 g Oral Daily  . sodium chloride flush  3 mL Intravenous Q12H  . sodium chloride flush  3 mL Intravenous Q12H  . vancomycin  750 mg Intravenous Q8H    Objective: Vital signs in last 24 hours: Temp:  [96.9 F (36.1 C)-102.5 F (39.2 C)] 96.9 F (36.1 C) (05/11 0800) Pulse Rate:  [84-118] 84 (05/11 0800) Resp:  [13-40] 13 (05/11 0800) BP: (113-146)/(54-80) 113/64 mmHg (05/11 0800) SpO2:  [92 %-98 %] 96 % (05/11 0800) Weight:  [78.926 kg (174 lb)-79.4 kg (175 lb 0.7 oz)] 79.4 kg (175 lb 0.7 oz) (05/11 0338)   General appearance: alert, cooperative and no distress Neck: FROM Resp: clear to auscultation bilaterally Cardio: regular rate and rhythm GI: normal findings: bowel sounds normal and soft,  non-tender Extremities: edema > 3+ BLE  Lab Results  Recent Labs  01/01/16 1350 01/02/16 0720  WBC 31.1* 29.4*  HGB 14.4 12.9*  HCT 46.9 43.4  NA 132* 137  K 4.4 4.1  CL 97* 103  CO2 21* 23  BUN 18 18  CREATININE 1.33* 0.88   Liver Panel  Recent Labs  01/01/16 1350  PROT 6.3*  ALBUMIN 2.5*  AST 18  ALT 11*  ALKPHOS 410*  BILITOT 1.0   Sedimentation Rate  Recent Labs  01/01/16 1824  ESRSEDRATE 14   C-Reactive Protein  Recent Labs  01/01/16 1824  CRP 18.7*    Microbiology: Recent Results (from the past 240 hour(s))  CSF culture     Status: None (Preliminary result)   Collection Time: 01/01/16  3:30 PM  Result Value Ref Range Status   Specimen Description CSF  Final   Special Requests NONE  Final   Gram Stain   Final    DIRECT SMEAR DEGENERATED CELLULAR MATERIAL PRESENT NO WBC SEEN NO ORGANISMS SEEN    Culture PENDING  Incomplete   Report Status PENDING  Incomplete  Rapid strep screen (not at [facility])     Status: None   Collection Time: 01/01/16  3:32 PM  Result  Value Ref Range Status   Streptococcus, Group A Screen (Direct) NEGATIVE NEGATIVE Final    Comment: (NOTE) A Rapid Antigen test may result negative if the antigen level in the sample is below the detection level of this test. The FDA has not cleared this test as a stand-alone test therefore the rapid antigen negative result has reflexed to a Group A Strep culture.   MRSA PCR Screening     Status: None   Collection Time: 01/02/16  3:46 AM  Result Value Ref Range Status   MRSA by PCR NEGATIVE NEGATIVE Final    Comment:        The GeneXpert MRSA Assay (FDA approved for NASAL specimens only), is one component of a comprehensive MRSA colonization surveillance program. It is not intended to diagnose MRSA infection nor to guide or monitor treatment for MRSA infections.     Studies/Results: Dg Chest 2 View  01/01/2016  CLINICAL DATA:  Fever. EXAM: CHEST  2 VIEW COMPARISON:   01/01/2016 FINDINGS: Moderate pleural effusion on the left. Small pleural effusion on the right. Non obscured cardiomediastinal contours are normal. The underlying left lower lobe and lingula are obscured. IMPRESSION: Bilateral pleural effusion, larger and moderate on the left. There could be pneumonia in the obscured left basilar lung in this patient with history of fever. Electronically Signed   By: Monte Fantasia M.D.   On: 01/01/2016 14:41   Dg Chest 2 View  01/01/2016  CLINICAL DATA:  Shortness of breath. EXAM: CHEST  2 VIEW COMPARISON:  04/30/2006 FINDINGS: There are bilateral pleural effusions, moderate on the left and small on the right. Bibasilar atelectasis. Heart is borderline in size. No acute bony abnormality. IMPRESSION: Bilateral pleural effusions, left larger than right and bibasilar atelectasis. Electronically Signed   By: Rolm Baptise M.D.   On: 01/01/2016 12:10   Ct Head Wo Contrast  01/01/2016  CLINICAL DATA:  Stiffness in neck and joints that began today. No headache is reported. EXAM: CT HEAD WITHOUT CONTRAST TECHNIQUE: Contiguous axial images were obtained from the base of the skull through the vertex without intravenous contrast. COMPARISON:  None. FINDINGS: No evidence for acute infarction, hemorrhage, mass lesion, hydrocephalus, or extra-axial fluid. Normal for age cerebral volume. Minor hypoattenuation of white matter, likely chronic microvascular ischemic change. 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 No areas of cortical infarction. Vascular calcification. No skull base or calvarial abnormalities. Negative orbits. BILATERAL ethmoid sinus disease. No mastoid fluid. 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 No acute intracranial findings. Electronically Signed   By: Staci Righter M.D.   On: 01/01/2016 14:52   Dg Hand 2 View Right  01/01/2016  CLINICAL DATA:  Bilateral hand pain, fever EXAM: RIGHT HAND - 2 VIEW  COMPARISON:  None. FINDINGS: No fracture or dislocation is seen. Possible marginal erosion along the radial aspect of the 3rd PIP joint, equivocal. Joint spaces are otherwise preserved. Mild soft tissue swelling involving the 2nd and 3rd digits. No radiopaque foreign body is seen. IMPRESSION: Possible marginal erosion at the 3rd PIP joint, although equivocal. Inflammatory arthropathy is not excluded. No fracture or dislocation is seen. Electronically Signed   By: Julian Hy M.D.   On: 01/01/2016 18:56   Dg Hand 2 View Left  01/01/2016  CLINICAL DATA:  Multifocal joint pain in both hands for 2 days. No known injury. Initial encounter. EXAM: LEFT HAND - 2 VIEW COMPARISON:  None. FINDINGS: There is no evidence of fracture or  dislocation. The PIP joint of the left little finger is in flexion, presumably positional. There is no evidence of arthropathy or other focal bone abnormality. Soft tissues are unremarkable. IMPRESSION: No acute abnormality. Electronically Signed   By: Inge Rise M.D.   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  His rash is not consistent with RMSF. He has defervesced without therapy for this. Will stop atbx when cx negative D/c isolation after 48h of atbx (5pm tomorrow)   Total days of antibiotics: 1 vanco/merrem         Bobby Rumpf Infectious Diseases (pager) [phone] [email or web address] 01/02/2016, 10:48 AM  LOS: 1 day

## 2016-01-02 NOTE — Progress Notes (Signed)
[facility]  Patient Status: Active (receiving services up to time of hospitalization)  [facility] is providing the following services: RN  If patient discharges after hours, please call [phone].   Benjamin [name] 01/02/2016, 11:51 AM

## 2016-01-03 LAB — CBC
HCT: 42.1 % (ref 39.0–52.0)
HEMOGLOBIN: 12.6 g/dL — AB (ref 13.0–17.0)
MCH: 19.2 pg — AB (ref 26.0–34.0)
MCHC: 29.9 g/dL — AB (ref 30.0–36.0)
MCV: 64.1 fL — ABNORMAL LOW (ref 78.0–100.0)
Platelets: 133 10*3/uL — ABNORMAL LOW (ref 150–400)
RBC: 6.57 MIL/uL — ABNORMAL HIGH (ref 4.22–5.81)
RDW: 18.9 % — ABNORMAL HIGH (ref 11.5–15.5)
WBC: 27.8 10*3/uL — ABNORMAL HIGH (ref 4.0–10.5)

## 2016-01-03 LAB — CULTURE, GROUP A STREP: Organism ID, Bacteria: NORMAL

## 2016-01-03 LAB — RHEUMATOID FACTOR: RHEUMATOID FACTOR: 15.3 [IU]/mL — AB (ref 0.0–13.9)

## 2016-01-03 LAB — ANTINUCLEAR ANTIBODIES, IFA: ANA Ab, IFA: NEGATIVE

## 2016-01-03 LAB — ANTISTREPTOLYSIN O TITER: ASO: 127 IU/mL (ref <=200)

## 2016-01-03 LAB — CYCLIC CITRUL PEPTIDE ANTIBODY, IGG/IGA: CCP ANTIBODIES IGG/IGA: 4 U (ref 0–19)

## 2016-01-03 NOTE — Progress Notes (Signed)
PROGRESS NOTE                                                                                                                                                                                                             Patient Demographics:    Benjamin [name], is a 65 y.o. male, DOB - 16-Feb-1951, [id number]  Admit date - 01/01/2016   Admitting Physician Velvet Bathe, MD  Outpatient Primary MD for the patient is Geoffery Lyons, MD  LOS - 2  Outpatient Specialists Dr. Marin Olp  Chief Complaint  Patient presents with  . Weakness       Brief Narrative   65 y.o. male, with h/o of polycethemia recent history of perforated bowel which patient underwent operation by general surgery. He was subsequently discharged home and he reports finishing antibiotic yesterday. He reports that within the last few days he has developed joint aches with it starting at his index fingers on both hands. Since then he reports stiffness in his neck.   Subjective:    Benjamin [name]  States he feels much better today. He suspects that the antibiotics he was taking for H pilori may have causes the adverse effects he presented to the hospital with.    Assessment  & Plan :    Active Problems:   SIRs ([name])/ Leukocytosis -.RA work up pending: of note rheumatoid factor was positive. - ID on board and currently working up for infectious etiology. - sirs physiology resolving  Lactic acidosis  - resolved  Code Status : full  Family Communication  : d/c patient directly  Disposition Plan  : Pending improvement in condition  Barriers For Discharge : pending work up and investigational studies  Consults  :  ID  Procedures  : LP  DVT Prophylaxis  :  Heparin  Lab Results  Component Value Date   PLT 133* 01/03/2016    Antibiotics  :  Meropenem and Vancomycin  Anti-infectives    Start     Dose/Rate Route Frequency Ordered Stop    01/01/16 2300  meropenem (MERREM) 2 g in sodium chloride 0.9 % 100 mL IVPB     2 g 200 mL/hr over 30 Minutes Intravenous Every 8 hours 01/01/16 1445     01/01/16 2300  vancomycin (VANCOCIN) IVPB 750 mg/150 ml premix     750 mg 150  mL/hr over 60 Minutes Intravenous Every 8 hours 01/01/16 1445     01/01/16 1445  meropenem (MERREM) 2 g in sodium chloride 0.9 % 100 mL IVPB     2 g 200 mL/hr over 30 Minutes Intravenous  Once 01/01/16 1432 01/01/16 1710   01/01/16 1445  vancomycin (VANCOCIN) IVPB 1000 mg/200 mL premix  Status:  Discontinued     1,000 mg 200 mL/hr over 60 Minutes Intravenous  Once 01/01/16 1432 01/01/16 1435   01/01/16 1445  vancomycin (VANCOCIN) 1,500 mg in sodium chloride 0.9 % 500 mL IVPB     1,500 mg 250 mL/hr over 120 Minutes Intravenous  Once 01/01/16 1435 01/01/16 1739        Objective:   Filed Vitals:   01/02/16 2000 01/03/16 0000 01/03/16 0400 01/03/16 0800  BP: 113/86 124/70 117/68 123/66  Pulse: 91 80 69 75  Temp: 98 F (36.7 C) 97.9 F (36.6 C) 97.6 F (36.4 C) 97.8 F (36.6 C)  TempSrc: Oral Oral Oral Oral  Resp: 24 23 9 14   Height:      Weight:      SpO2: 96% 96% 96% 96%    Wt Readings from Last 3 Encounters:  01/02/16 79.4 kg (175 lb 0.7 oz)  01/01/16 78.926 kg (174 lb)  12/15/15 74.844 kg (165 lb)     Intake/Output Summary (Last 24 hours) at 01/03/16 1049 Last data filed at 01/03/16 0900  Gross per 24 hour  Intake   1683 ml  Output    450 ml  Net   1233 ml     Physical Exam  Awake Alert, Oriented X 3, No new F.N deficits, Normal affect Supple Neck,No JVD, No cervical lymphadenopathy appreciated.  Symmetrical Chest wall movement, Good air movement bilaterally, CTAB RRR,No Gallops,Rubs or new Murmurs,  +ve B.Sounds, Abd Soft, No tenderness, No organomegaly appriciated, No rebound - guarding or rigidity. No Cyanosis, no erythema of joints of hands BL    Data Review:    CBC  Recent Labs Lab 01/01/16 1217 01/01/16 1350  01/02/16 0720 01/03/16 0019  WBC 31.2* 31.1* 29.4* 27.8*  HGB 14.2 14.4 12.9* 12.6*  HCT 43.2* 46.9 43.4 42.1  PLT  --  150 104* 133*  MCV 63.0* 66.1* 66.1* 64.1*  MCH 20.7* 20.3* 19.6* 19.2*  MCHC 32.8 30.7 29.7* 29.9*  RDW  --  19.7* 19.1* 18.9*  LYMPHSABS  --  0.6*  --   --   MONOABS  --  2.2*  --   --   EOSABS  --  0.0  --   --   BASOSABS  --  0.0  --   --     Chemistries   Recent Labs Lab 01/01/16 1214 01/01/16 1350 01/02/16 0720  NA 132* 132* 137  K 4.3 4.4 4.1  CL 96* 97* 103  CO2 17* 21* 23  GLUCOSE 110* 117* 150*  BUN 18 18 18   CREATININE 1.14 1.33* 0.88  CALCIUM 7.9* 8.4* 8.2*  MG  --   --  2.0  AST 13 18  --   ALT 9 11*  --   ALKPHOS 473* 410*  --   BILITOT 0.7 1.0  --    ------------------------------------------------------------------------------------------------------------------ No results for input(s): CHOL, HDL, LDLCALC, TRIG, CHOLHDL, LDLDIRECT in the last 72 hours.  No results found for: HGBA1C ------------------------------------------------------------------------------------------------------------------ No results for input(s): TSH, T4TOTAL, T3FREE, THYROIDAB in the last 72 hours.  Invalid input(s): FREET3 ------------------------------------------------------------------------------------------------------------------ No results for input(s): VITAMINB12, FOLATE, FERRITIN, TIBC, IRON,  RETICCTPCT in the last 72 hours.  Coagulation profile No results for input(s): INR, PROTIME in the last 168 hours.  No results for input(s): DDIMER in the last 72 hours.  Cardiac Enzymes No results for input(s): CKMB, TROPONINI, MYOGLOBIN in the last 168 hours.  Invalid input(s): CK ------------------------------------------------------------------------------------------------------------------    Component Value Date/Time   BNP 108.3* 01/01/2016 1350    Inpatient Medications  Scheduled Meds: . heparin  5,000 Units Subcutaneous Q8H  .  meropenem (MERREM) IV  2 g Intravenous Q8H  . omega-3 acid ethyl esters  1 g Oral Daily  . sodium chloride flush  3 mL Intravenous Q12H  . sodium chloride flush  3 mL Intravenous Q12H  . vancomycin  750 mg Intravenous Q8H   Continuous Infusions:  PRN Meds:.sodium chloride, acetaminophen **OR** acetaminophen, oxyCODONE, sodium chloride flush  Micro Results Recent Results (from the past 240 hour(s))  Culture, Group A Strep     Status: None   Collection Time: 01/01/16 12:11 PM  Result Value Ref Range Status   Organism ID, Bacteria Normal Upper Respiratory Flora  Final   Organism ID, Bacteria No Beta Hemolytic Streptococci Isolated  Final  Blood Culture (routine x 2)     Status: None (Preliminary result)   Collection Time: 01/01/16  2:00 PM  Result Value Ref Range Status   Specimen Description BLOOD RIGHT ANTECUBITAL  Final   Special Requests   Final    BOTTLES DRAWN AEROBIC AND ANAEROBIC 10CC AER 5CC ANA   Culture NO GROWTH 1 DAY  Final   Report Status PENDING  Incomplete  Blood Culture (routine x 2)     Status: None (Preliminary result)   Collection Time: 01/01/16  2:10 PM  Result Value Ref Range Status   Specimen Description BLOOD RIGHT HAND  Final   Special Requests BOTTLES DRAWN AEROBIC ONLY 10CC  Final   Culture NO GROWTH 1 DAY  Final   Report Status PENDING  Incomplete  CSF culture     Status: None (Preliminary result)   Collection Time: 01/01/16  3:30 PM  Result Value Ref Range Status   Specimen Description CSF  Final   Special Requests NONE  Final   Gram Stain   Final    DIRECT SMEAR DEGENERATED CELLULAR MATERIAL PRESENT NO WBC SEEN NO ORGANISMS SEEN    Culture NO GROWTH 2 DAYS  Final   Report Status PENDING  Incomplete  Rapid strep screen (not at [facility])     Status: None   Collection Time: 01/01/16  3:32 PM  Result Value Ref Range Status   Streptococcus, Group A Screen (Direct) NEGATIVE NEGATIVE Final    Comment: (NOTE) A Rapid Antigen test may result negative if  the antigen level in the sample is below the detection level of this test. The FDA has not cleared this test as a stand-alone test therefore the rapid antigen negative result has reflexed to a Group A Strep culture.   Culture, group A strep     Status: None (Preliminary result)   Collection Time: 01/01/16  3:32 PM  Result Value Ref Range Status   Specimen Description THROAT  Final   Special Requests NONE  Final   Culture TOO YOUNG TO READ  Final   Report Status PENDING  Incomplete  Urine culture     Status: Abnormal   Collection Time: 01/01/16  5:16 PM  Result Value Ref Range Status   Specimen Description URINE, CLEAN CATCH  Final   Special Requests NONE  Final   Culture 9,000 COLONIES/mL INSIGNIFICANT GROWTH (A)  Final   Report Status 01/02/2016 FINAL  Final  MRSA PCR Screening     Status: None   Collection Time: 01/02/16  3:46 AM  Result Value Ref Range Status   MRSA by PCR NEGATIVE NEGATIVE Final    Comment:        The GeneXpert MRSA Assay (FDA approved for NASAL specimens only), is one component of a comprehensive MRSA colonization surveillance program. It is not intended to diagnose MRSA infection nor to guide or monitor treatment for MRSA infections.     Radiology Reports Dg Chest 2 View  01/01/2016  CLINICAL DATA:  Fever. EXAM: CHEST  2 VIEW COMPARISON:  01/01/2016 FINDINGS: Moderate pleural effusion on the left. Small pleural effusion on the right. Non obscured cardiomediastinal contours are normal. The underlying left lower lobe and lingula are obscured. IMPRESSION: Bilateral pleural effusion, larger and moderate on the left. There could be pneumonia in the obscured left basilar lung in this patient with history of fever. Electronically Signed   By: Monte Fantasia M.D.   On: 01/01/2016 14:41   Dg Chest 2 View  01/01/2016  CLINICAL DATA:  Shortness of breath. EXAM: CHEST  2 VIEW COMPARISON:  04/30/2006 FINDINGS: There are bilateral pleural effusions, moderate on the  left and small on the right. Bibasilar atelectasis. Heart is borderline in size. No acute bony abnormality. IMPRESSION: Bilateral pleural effusions, left larger than right and bibasilar atelectasis. Electronically Signed   By: Rolm Baptise M.D.   On: 01/01/2016 12:10   Ct Head Wo Contrast  01/01/2016  CLINICAL DATA:  Stiffness in neck and joints that began today. No headache is reported. EXAM: CT HEAD WITHOUT CONTRAST TECHNIQUE: Contiguous axial images were obtained from the base of the skull through the vertex without intravenous contrast. COMPARISON:  None. FINDINGS: No evidence for acute infarction, hemorrhage, mass lesion, hydrocephalus, or extra-axial fluid. Normal for age cerebral volume. Minor hypoattenuation of white matter, likely chronic microvascular ischemic change. 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 No areas of cortical infarction. Vascular calcification. No skull base or calvarial abnormalities. Negative orbits. BILATERAL ethmoid sinus disease. No mastoid fluid. 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 No acute intracranial findings. Electronically Signed   By: Staci Righter M.D.   On: 01/01/2016 14:52   US Abdomen Complete  12/15/2015  CLINICAL DATA:  Right upper quadrant pain for 1 day. EXAM: ABDOMEN ULTRASOUND COMPLETE COMPARISON:  Ultrasound 12/14/2014 FINDINGS: Gallbladder: Physiologically distended. The previous edematous gallbladder wall has resolved. Wall thickness currently normal at 3 mm. No sonographic Murphy sign noted by sonographer. Common bile duct: Diameter: 5.4 mm. Liver: No focal lesion identified. Within normal limits in parenchymal echogenicity. Normal directional flow in the main portal vein. IVC: No abnormality visualized. Pancreas: Visualized portion unremarkable. Spleen: Spleen is enlarged measuring 12.7 x 17.3 x 9.9 cm for a volume of 1137 cc. Right Kidney: Length: 11.2 cm. Echogenicity within normal  limits. No mass or hydronephrosis visualized. Left Kidney: Length: 10.3 cm. Echogenicity within normal limits. No mass or hydronephrosis visualized. Abdominal aorta: No aneurysm visualized. Other findings: Small volume of ascites. IMPRESSION: 1. Splenomegaly. 2. Small volume intra-abdominal ascites. 3. Resolution of previous gallbladder wall thickening. Gallbladder appears normal. Electronically Signed   By: Jeb Levering M.D.   On: 12/15/2015 00:38   Dg Hand 2 View Right  01/01/2016  CLINICAL DATA:  Bilateral hand pain, fever EXAM: RIGHT HAND - 2  VIEW COMPARISON:  None. FINDINGS: No fracture or dislocation is seen. Possible marginal erosion along the radial aspect of the 3rd PIP joint, equivocal. Joint spaces are otherwise preserved. Mild soft tissue swelling involving the 2nd and 3rd digits. No radiopaque foreign body is seen. IMPRESSION: Possible marginal erosion at the 3rd PIP joint, although equivocal. Inflammatory arthropathy is not excluded. No fracture or dislocation is seen. Electronically Signed   By: Julian Hy M.D.   On: 01/01/2016 18:56   Dg Hand 2 View Left  01/01/2016  CLINICAL DATA:  Multifocal joint pain in both hands for 2 days. No known injury. Initial encounter. EXAM: LEFT HAND - 2 VIEW COMPARISON:  None. FINDINGS: There is no evidence of fracture or dislocation. The PIP joint of the left little finger is in flexion, presumably positional. There is no evidence of arthropathy or other focal bone abnormality. Soft tissues are unremarkable. IMPRESSION: No acute abnormality. Electronically Signed   By: Inge Rise M.D.   On: 01/01/2016 18:52   Ct Angio Abd/pel W/ And/or W/o  12/15/2015  CLINICAL DATA:  Right flank and right upper quadrant pain. Onset yesterday. Vomiting. EXAM: CTA ABDOMEN AND PELVIS wITHOUT AND WITH CONTRAST TECHNIQUE: Multidetector CT imaging of the abdomen and pelvis was performed using the standard protocol during bolus administration of intravenous  contrast. Multiplanar reconstructed images and MIPs were obtained and reviewed to evaluate the vascular anatomy. CONTRAST:  100 cc Isovue 370 IV COMPARISON:  Abdominal ultrasound earlier this day. FINDINGS: Lower chest: Small bilateral pleural effusions, right greater than left with adjacent atelectasis. Liver: No focal lesion. Hepatobiliary: Gallbladder physiologically distended, no calcified stone. No biliary dilatation. Pancreas: Normal. Spleen: Enlarged measuring 19.1 cm craniocaudal. Pre-pyloric gastric wall thickening. Adrenal glands: No nodule. Kidneys: Symmetric renal enhancement.  No hydronephrosis. Stomach/Bowel: Stomach physiologically distended. There are no dilated or thickened small bowel loops. There is mild wall thickening involving the ascending colon. Small volume of stool throughout the remainder the colon. The appendix is normal. Aorta: Normal in caliber without dissection, aneurysm, or periaortic soft tissue stranding. No significant atherosclerosis. Celiac, superior mesenteric, and inferior mesenteric arteries are patent. Single bilateral renal arteries are patent. Both common and external iliac artery systems are widely patent. Vascular/Lymphatic: There is no mesenteric or portal venous gas. Splenic vein is prominent in size. There is questionably eccentric low-density within left greater than right external iliac and left common iliac veins. No retroperitoneal adenopathy. Abdominal aorta is normal in caliber. Reproductive: Prominent sized prostate gland. Bladder: Physiologically distended without wall thickening. Other: Moderate volume of free air and free fluid in the abdomen. Free air is seen anteriorly in the upper, mid, and lesser extent lower abdomen with air tracking along the undersurface of the liver and spleen. Small volume of free fluid throughout the abdomen and pelvis. No loculated abscess. Fat in the right inguinal canal. Musculoskeletal: There are no acute or suspicious osseous  abnormalities. Review of the MIP images confirms the above findings. IMPRESSION: 1. Perforated viscus with free air and free fluid in the abdomen. Upper GI source is strongly favored with gastric wall thickening in the pre pyloric region. 2. Splenomegaly, patient with history of polcythemia. 3. Questionable thrombus in left greater than right external and left common iliac veins, this appears eccentic. Unclear whether this represents DVT, likely chronic, versus venous mixing secondary to phase of contrast. Correlation with clinical history recommended, duplex evaluation of the lower extremities may be of value based on clinical grounds. Critical Value/emergent results were called by  telephone at the time of interpretation on 12/15/2015 at 3:21 am to Dr. Stark Jock, who verbally acknowledged these results. Electronically Signed   By: Jeb Levering M.D.   On: 12/15/2015 03:28    Time Spent in minutes  25   Velvet Bathe M.D on 01/03/2016 at 10:49 AM  Between 7am to 7pm - Pager - [phone]  After 7pm go to [email or web address] - password [facility]  [facility] -  Office  [phone]

## 2016-01-03 NOTE — Progress Notes (Signed)
INFECTIOUS DISEASE PROGRESS NOTE  ID: Benjamin [name] is a 65 y.o. male with  Active Problems:   Sepsis ([place])   Leukocytosis  Subjective: Feels better. No further neck ache, shoulder pain. His hands have mild ache but have no further swelling.   Abtx:  Anti-infectives    Start     Dose/Rate Route Frequency Ordered Stop   01/01/16 2300  meropenem (MERREM) 2 g in sodium chloride 0.9 % 100 mL IVPB     2 g 200 mL/hr over 30 Minutes Intravenous Every 8 hours 01/01/16 1445     01/01/16 2300  vancomycin (VANCOCIN) IVPB 750 mg/150 ml premix     750 mg 150 mL/hr over 60 Minutes Intravenous Every 8 hours 01/01/16 1445     01/01/16 1445  meropenem (MERREM) 2 g in sodium chloride 0.9 % 100 mL IVPB     2 g 200 mL/hr over 30 Minutes Intravenous  Once 01/01/16 1432 01/01/16 1710   01/01/16 1445  vancomycin (VANCOCIN) IVPB 1000 mg/200 mL premix  Status:  Discontinued     1,000 mg 200 mL/hr over 60 Minutes Intravenous  Once 01/01/16 1432 01/01/16 1435   01/01/16 1445  vancomycin (VANCOCIN) 1,500 mg in sodium chloride 0.9 % 500 mL IVPB     1,500 mg 250 mL/hr over 120 Minutes Intravenous  Once 01/01/16 1435 01/01/16 1739      Medications:  Scheduled: . heparin  5,000 Units Subcutaneous Q8H  . meropenem (MERREM) IV  2 g Intravenous Q8H  . omega-3 acid ethyl esters  1 g Oral Daily  . sodium chloride flush  3 mL Intravenous Q12H  . sodium chloride flush  3 mL Intravenous Q12H  . vancomycin  750 mg Intravenous Q8H    Objective: Vital signs in last 24 hours: Temp:  [97.6 F (36.4 C)-98 F (36.7 C)] 97.6 F (36.4 C) (05/12 0400) Pulse Rate:  [69-98] 69 (05/12 0400) Resp:  [9-28] 9 (05/12 0400) BP: (113-136)/(65-86) 117/68 mmHg (05/12 0400) SpO2:  [94 %-96 %] 96 % (05/12 0400)   General appearance: alert, cooperative and no distress Neck: FROM Resp: clear to auscultation bilaterally Cardio: regular rate and rhythm GI: normal findings: bowel sounds normal and soft,  non-tender Extremities: no edema in hands.   Lab Results  Recent Labs  01/01/16 1350 01/02/16 0720 01/03/16 0019  WBC 31.1* 29.4* 27.8*  HGB 14.4 12.9* 12.6*  HCT 46.9 43.4 42.1  NA 132* 137  --   K 4.4 4.1  --   CL 97* 103  --   CO2 21* 23  --   BUN 18 18  --   CREATININE 1.33* 0.88  --    Liver Panel  Recent Labs  01/01/16 1214 01/01/16 1350  PROT 5.4* 6.3*  ALBUMIN 2.8* 2.5*  AST 13 18  ALT 9 11*  ALKPHOS 473* 410*  BILITOT 0.7 1.0   Sedimentation Rate  Recent Labs  01/01/16 1824  ESRSEDRATE 14   C-Reactive Protein  Recent Labs  01/01/16 1824  CRP 18.7*    Microbiology: Recent Results (from the past 240 hour(s))  Culture, Group A Strep     Status: None   Collection Time: 01/01/16 12:11 PM  Result Value Ref Range Status   Organism ID, Bacteria Normal Upper Respiratory Flora  Final   Organism ID, Bacteria No Beta Hemolytic Streptococci Isolated  Final  Blood Culture (routine x 2)     Status: None (Preliminary result)   Collection Time: 01/01/16  2:00  PM  Result Value Ref Range Status   Specimen Description BLOOD RIGHT ANTECUBITAL  Final   Special Requests   Final    BOTTLES DRAWN AEROBIC AND ANAEROBIC 10CC AER 5CC ANA   Culture NO GROWTH 1 DAY  Final   Report Status PENDING  Incomplete  Blood Culture (routine x 2)     Status: None (Preliminary result)   Collection Time: 01/01/16  2:10 PM  Result Value Ref Range Status   Specimen Description BLOOD RIGHT HAND  Final   Special Requests BOTTLES DRAWN AEROBIC ONLY 10CC  Final   Culture NO GROWTH 1 DAY  Final   Report Status PENDING  Incomplete  CSF culture     Status: None (Preliminary result)   Collection Time: 01/01/16  3:30 PM  Result Value Ref Range Status   Specimen Description CSF  Final   Special Requests NONE  Final   Gram Stain   Final    DIRECT SMEAR DEGENERATED CELLULAR MATERIAL PRESENT NO WBC SEEN NO ORGANISMS SEEN    Culture NO GROWTH < 24 HOURS  Final   Report Status PENDING   Incomplete  Rapid strep screen (not at [facility])     Status: None   Collection Time: 01/01/16  3:32 PM  Result Value Ref Range Status   Streptococcus, Group A Screen (Direct) NEGATIVE NEGATIVE Final    Comment: (NOTE) A Rapid Antigen test may result negative if the antigen level in the sample is below the detection level of this test. The FDA has not cleared this test as a stand-alone test therefore the rapid antigen negative result has reflexed to a Group A Strep culture.   Culture, group A strep     Status: None (Preliminary result)   Collection Time: 01/01/16  3:32 PM  Result Value Ref Range Status   Specimen Description THROAT  Final   Special Requests NONE  Final   Culture TOO YOUNG TO READ  Final   Report Status PENDING  Incomplete  Urine culture     Status: Abnormal   Collection Time: 01/01/16  5:16 PM  Result Value Ref Range Status   Specimen Description URINE, CLEAN CATCH  Final   Special Requests NONE  Final   Culture 9,000 COLONIES/mL INSIGNIFICANT GROWTH (A)  Final   Report Status 01/02/2016 FINAL  Final  MRSA PCR Screening     Status: None   Collection Time: 01/02/16  3:46 AM  Result Value Ref Range Status   MRSA by PCR NEGATIVE NEGATIVE Final    Comment:        The GeneXpert MRSA Assay (FDA approved for NASAL specimens only), is one component of a comprehensive MRSA colonization surveillance program. It is not intended to diagnose MRSA infection nor to guide or monitor treatment for MRSA infections.     Studies/Results: Dg Chest 2 View  01/01/2016  CLINICAL DATA:  Fever. EXAM: CHEST  2 VIEW COMPARISON:  01/01/2016 FINDINGS: Moderate pleural effusion on the left. Small pleural effusion on the right. Non obscured cardiomediastinal contours are normal. The underlying left lower lobe and lingula are obscured. IMPRESSION: Bilateral pleural effusion, larger and moderate on the left. There could be pneumonia in the obscured left basilar lung in this patient with history  of fever. Electronically Signed   By: Monte Fantasia M.D.   On: 01/01/2016 14:41   Dg Chest 2 View  01/01/2016  CLINICAL DATA:  Shortness of breath. EXAM: CHEST  2 VIEW COMPARISON:  04/30/2006 FINDINGS: There are bilateral  pleural effusions, moderate on the left and small on the right. Bibasilar atelectasis. Heart is borderline in size. No acute bony abnormality. IMPRESSION: Bilateral pleural effusions, left larger than right and bibasilar atelectasis. Electronically Signed   By: Rolm Baptise M.D.   On: 01/01/2016 12:10   Ct Head Wo Contrast  01/01/2016  CLINICAL DATA:  Stiffness in neck and joints that began today. No headache is reported. EXAM: CT HEAD WITHOUT CONTRAST TECHNIQUE: Contiguous axial images were obtained from the base of the skull through the vertex without intravenous contrast. COMPARISON:  None. FINDINGS: No evidence for acute infarction, hemorrhage, mass lesion, hydrocephalus, or extra-axial fluid. Normal for age cerebral volume. Minor hypoattenuation of white matter, likely chronic microvascular ischemic change. 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 No areas of cortical infarction. Vascular calcification. No skull base or calvarial abnormalities. Negative orbits. BILATERAL ethmoid sinus disease. No mastoid fluid. 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 No acute intracranial findings. Electronically Signed   By: Staci Righter M.D.   On: 01/01/2016 14:52   Dg Hand 2 View Right  01/01/2016  CLINICAL DATA:  Bilateral hand pain, fever EXAM: RIGHT HAND - 2 VIEW COMPARISON:  None. FINDINGS: No fracture or dislocation is seen. Possible marginal erosion along the radial aspect of the 3rd PIP joint, equivocal. Joint spaces are otherwise preserved. Mild soft tissue swelling involving the 2nd and 3rd digits. No radiopaque foreign body is seen. IMPRESSION: Possible marginal erosion at the 3rd PIP joint, although equivocal. Inflammatory  arthropathy is not excluded. No fracture or dislocation is seen. Electronically Signed   By: Julian Hy M.D.   On: 01/01/2016 18:56   Dg Hand 2 View Left  01/01/2016  CLINICAL DATA:  Multifocal joint pain in both hands for 2 days. No known injury. Initial encounter. EXAM: LEFT HAND - 2 VIEW COMPARISON:  None. FINDINGS: There is no evidence of fracture or dislocation. The PIP joint of the left little finger is in flexion, presumably positional. There is no evidence of arthropathy or other focal bone abnormality. Soft tissues are unremarkable. IMPRESSION: No acute abnormality. Electronically Signed   By: Inge Rise M.D.   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 If negative/final tomorrow, would send him home off atbx tomorrow.  CSF HSV (-) Await CSF VZV We are not in lyme endemic area, would not send this.  His rash is not consistent with RMSF. He has defervesced without therapy for this. Will stop atbx when cx negative D/c isolation after 48h of atbx (5pm today) He received 10 days of anti-H pylori therapy. Would not restart.   Total days of antibiotics: 2 vanco/merrem         Bobby Rumpf Infectious Diseases (pager) [phone] [email or web address] 01/03/2016, 8:56 AM  LOS: 2 days

## 2016-01-03 NOTE — Addendum Note (Signed)
Addended by: Burnis Kingfisher on: 01/03/2016 09:45 AM   Modules accepted: Miquel Dunn

## 2016-01-04 DIAGNOSIS — R6510 Systemic inflammatory response syndrome (SIRS) of non-infectious origin without acute organ dysfunction: Secondary | ICD-10-CM

## 2016-01-04 LAB — CULTURE, GROUP A STREP (THRC)

## 2016-01-04 NOTE — Discharge Summary (Signed)
Physician Discharge Summary  Benjamin [name] [id number] DOB: 1951/02/25 DOA: 01/01/2016  PCP: Geoffery Lyons, MD  Admit date: 01/01/2016 Discharge date: 01/04/2016  Time spent: > 35 minutes  Recommendations for Outpatient Follow-up:  1. Monitor WBC levels 2. F/u with labs ordered in hospital in particular to assess for RA   Discharge Diagnoses:  Active Problems:   Sepsis ([place])   Leukocytosis   Discharge Condition: stable  Diet recommendation: heart healthy  Filed Weights   01/02/16 0338  Weight: 79.4 kg (175 lb 0.7 oz)    History of present illness:  From original HPI: 65 y.o. male, with h/o of polycethemia recent history of perforated bowel which patient underwent operation by general surgery. He was subsequently discharged home and he reports finishing antibiotic yesterday. He reports that within the last few days he has developed joint aches with it starting at his index fingers on both hands. Since then he reports stiffness in his neck. He denies any headaches.  Hospital Course:   Active Problems:  SIRs ([name])/ Leukocytosis -.RA work up pending: of note rheumatoid factor was positive. CCP was negative - ID on board and found no source of infection. - sirs physiology resolved. No source of infection identified  Lactic acidosis  - Patient may have had a adverse reaction to the clarithromycin and flagyl combination which he may have developed a stress related reaction.  Procedures:  LP  Consultations:  ID  Discharge Exam: Filed Vitals:   01/04/16 0800 01/04/16 0900  BP: 140/61 135/69  Pulse: 81 87  Temp:    Resp: 15 22    General: Pt in nad, alert and awake Cardiovascular: rrr, no rubs Respiratory: CTA BL, no wheezes  Discharge Instructions   Discharge Instructions    Call MD for:  difficulty breathing, headache or visual disturbances    Complete by:  As directed      Call MD for:  temperature >100.4    Complete by:  As directed       Diet - low sodium heart healthy    Complete by:  As directed      Discharge instructions    Complete by:  As directed   Please be sure to follow up with your primary care physician in 1-2 weeks or sooner should any new concerns arise.     Increase activity slowly    Complete by:  As directed           Current Discharge Medication List    CONTINUE these medications which have NOT CHANGED   Details  HYDROcodone-acetaminophen (NORCO/VICODIN) 5-325 MG tablet Take 1-2 tablets by mouth every 6 (six) hours as needed for moderate pain or severe pain. Qty: 40 tablet, Refills: 0    Magnesium 400 MG CAPS Take 1 capsule by mouth daily.     Omega-3 Fatty Acids (FISH OIL) 1000 MG CAPS Take 1 capsule by mouth daily.     pantoprazole (PROTONIX) 40 MG tablet Take 1 tablet (40 mg total) by mouth 2 (two) times daily. Qty: 60 tablet, Refills: 1    Vitamin E 400 UNITS TABS Take 1 tablet by mouth daily.     famotidine (PEPCID AC) 10 MG chewable tablet Chew 10 mg by mouth daily as needed for heartburn.      STOP taking these medications     clarithromycin (BIAXIN) 500 MG tablet      metroNIDAZOLE (FLAGYL) 500 MG tablet      sildenafil (VIAGRA) 25 MG tablet  Allergies  Allergen Reactions  . Keflex [Cephalexin] Hives  . Morphine And Related Hives      The results of significant diagnostics from this hospitalization (including imaging, microbiology, ancillary and laboratory) are listed below for reference.    Significant Diagnostic Studies: Dg Chest 2 View  01/01/2016  CLINICAL DATA:  Fever. EXAM: CHEST  2 VIEW COMPARISON:  01/01/2016 FINDINGS: Moderate pleural effusion on the left. Small pleural effusion on the right. Non obscured cardiomediastinal contours are normal. The underlying left lower lobe and lingula are obscured. IMPRESSION: Bilateral pleural effusion, larger and moderate on the left. There could be pneumonia in the obscured left basilar lung in this patient with history  of fever. Electronically Signed   By: Monte Fantasia M.D.   On: 01/01/2016 14:41   Dg Chest 2 View  01/01/2016  CLINICAL DATA:  Shortness of breath. EXAM: CHEST  2 VIEW COMPARISON:  04/30/2006 FINDINGS: There are bilateral pleural effusions, moderate on the left and small on the right. Bibasilar atelectasis. Heart is borderline in size. No acute bony abnormality. IMPRESSION: Bilateral pleural effusions, left larger than right and bibasilar atelectasis. Electronically Signed   By: Rolm Baptise M.D.   On: 01/01/2016 12:10   Ct Head Wo Contrast  01/01/2016  CLINICAL DATA:  Stiffness in neck and joints that began today. No headache is reported. EXAM: CT HEAD WITHOUT CONTRAST TECHNIQUE: Contiguous axial images were obtained from the base of the skull through the vertex without intravenous contrast. COMPARISON:  None. FINDINGS: No evidence for acute infarction, hemorrhage, mass lesion, hydrocephalus, or extra-axial fluid. Normal for age cerebral volume. Minor hypoattenuation of white matter, likely chronic microvascular ischemic change. 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 No areas of cortical infarction. Vascular calcification. No skull base or calvarial abnormalities. Negative orbits. BILATERAL ethmoid sinus disease. No mastoid fluid. 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 No acute intracranial findings. Electronically Signed   By: Staci Righter M.D.   On: 01/01/2016 14:52   US Abdomen Complete  12/15/2015  CLINICAL DATA:  Right upper quadrant pain for 1 day. EXAM: ABDOMEN ULTRASOUND COMPLETE COMPARISON:  Ultrasound 12/14/2014 FINDINGS: Gallbladder: Physiologically distended. The previous edematous gallbladder wall has resolved. Wall thickness currently normal at 3 mm. No sonographic Murphy sign noted by sonographer. Common bile duct: Diameter: 5.4 mm. Liver: No focal lesion identified. Within normal limits in parenchymal echogenicity. Normal  directional flow in the main portal vein. IVC: No abnormality visualized. Pancreas: Visualized portion unremarkable. Spleen: Spleen is enlarged measuring 12.7 x 17.3 x 9.9 cm for a volume of 1137 cc. Right Kidney: Length: 11.2 cm. Echogenicity within normal limits. No mass or hydronephrosis visualized. Left Kidney: Length: 10.3 cm. Echogenicity within normal limits. No mass or hydronephrosis visualized. Abdominal aorta: No aneurysm visualized. Other findings: Small volume of ascites. IMPRESSION: 1. Splenomegaly. 2. Small volume intra-abdominal ascites. 3. Resolution of previous gallbladder wall thickening. Gallbladder appears normal. Electronically Signed   By: Jeb Levering M.D.   On: 12/15/2015 00:38   Dg Hand 2 View Right  01/01/2016  CLINICAL DATA:  Bilateral hand pain, fever EXAM: RIGHT HAND - 2 VIEW COMPARISON:  None. FINDINGS: No fracture or dislocation is seen. Possible marginal erosion along the radial aspect of the 3rd PIP joint, equivocal. Joint spaces are otherwise preserved. Mild soft tissue swelling involving the 2nd and 3rd digits. No radiopaque foreign body is seen. IMPRESSION: Possible marginal erosion at the 3rd PIP joint, although equivocal. Inflammatory arthropathy is not  excluded. No fracture or dislocation is seen. Electronically Signed   By: Julian Hy M.D.   On: 01/01/2016 18:56   Dg Hand 2 View Left  01/01/2016  CLINICAL DATA:  Multifocal joint pain in both hands for 2 days. No known injury. Initial encounter. EXAM: LEFT HAND - 2 VIEW COMPARISON:  None. FINDINGS: There is no evidence of fracture or dislocation. The PIP joint of the left little finger is in flexion, presumably positional. There is no evidence of arthropathy or other focal bone abnormality. Soft tissues are unremarkable. IMPRESSION: No acute abnormality. Electronically Signed   By: Inge Rise M.D.   On: 01/01/2016 18:52   Ct Angio Abd/pel W/ And/or W/o  12/15/2015  CLINICAL DATA:  Right flank and right  upper quadrant pain. Onset yesterday. Vomiting. EXAM: CTA ABDOMEN AND PELVIS wITHOUT AND WITH CONTRAST TECHNIQUE: Multidetector CT imaging of the abdomen and pelvis was performed using the standard protocol during bolus administration of intravenous contrast. Multiplanar reconstructed images and MIPs were obtained and reviewed to evaluate the vascular anatomy. CONTRAST:  100 cc Isovue 370 IV COMPARISON:  Abdominal ultrasound earlier this day. FINDINGS: Lower chest: Small bilateral pleural effusions, right greater than left with adjacent atelectasis. Liver: No focal lesion. Hepatobiliary: Gallbladder physiologically distended, no calcified stone. No biliary dilatation. Pancreas: Normal. Spleen: Enlarged measuring 19.1 cm craniocaudal. Pre-pyloric gastric wall thickening. Adrenal glands: No nodule. Kidneys: Symmetric renal enhancement.  No hydronephrosis. Stomach/Bowel: Stomach physiologically distended. There are no dilated or thickened small bowel loops. There is mild wall thickening involving the ascending colon. Small volume of stool throughout the remainder the colon. The appendix is normal. Aorta: Normal in caliber without dissection, aneurysm, or periaortic soft tissue stranding. No significant atherosclerosis. Celiac, superior mesenteric, and inferior mesenteric arteries are patent. Single bilateral renal arteries are patent. Both common and external iliac artery systems are widely patent. Vascular/Lymphatic: There is no mesenteric or portal venous gas. Splenic vein is prominent in size. There is questionably eccentric low-density within left greater than right external iliac and left common iliac veins. No retroperitoneal adenopathy. Abdominal aorta is normal in caliber. Reproductive: Prominent sized prostate gland. Bladder: Physiologically distended without wall thickening. Other: Moderate volume of free air and free fluid in the abdomen. Free air is seen anteriorly in the upper, mid, and lesser extent lower  abdomen with air tracking along the undersurface of the liver and spleen. Small volume of free fluid throughout the abdomen and pelvis. No loculated abscess. Fat in the right inguinal canal. Musculoskeletal: There are no acute or suspicious osseous abnormalities. Review of the MIP images confirms the above findings. IMPRESSION: 1. Perforated viscus with free air and free fluid in the abdomen. Upper GI source is strongly favored with gastric wall thickening in the pre pyloric region. 2. Splenomegaly, patient with history of polcythemia. 3. Questionable thrombus in left greater than right external and left common iliac veins, this appears eccentic. Unclear whether this represents DVT, likely chronic, versus venous mixing secondary to phase of contrast. Correlation with clinical history recommended, duplex evaluation of the lower extremities may be of value based on clinical grounds. Critical Value/emergent results were called by telephone at the time of interpretation on 12/15/2015 at 3:21 am to Dr. Stark Jock, who verbally acknowledged these results. Electronically Signed   By: Jeb Levering M.D.   On: 12/15/2015 03:28    Microbiology: Recent Results (from the past 240 hour(s))  Culture, Group A Strep     Status: None   Collection Time: 01/01/16  12:11 PM  Result Value Ref Range Status   Organism ID, Bacteria Normal Upper Respiratory Flora  Final   Organism ID, Bacteria No Beta Hemolytic Streptococci Isolated  Final  Blood Culture (routine x 2)     Status: None (Preliminary result)   Collection Time: 01/01/16  2:00 PM  Result Value Ref Range Status   Specimen Description BLOOD RIGHT ANTECUBITAL  Final   Special Requests   Final    BOTTLES DRAWN AEROBIC AND ANAEROBIC 10CC AER 5CC ANA   Culture NO GROWTH 3 DAYS  Final   Report Status PENDING  Incomplete  Blood Culture (routine x 2)     Status: None (Preliminary result)   Collection Time: 01/01/16  2:10 PM  Result Value Ref Range Status   Specimen  Description BLOOD RIGHT HAND  Final   Special Requests BOTTLES DRAWN AEROBIC ONLY 10CC  Final   Culture NO GROWTH 3 DAYS  Final   Report Status PENDING  Incomplete  CSF culture     Status: None (Preliminary result)   Collection Time: 01/01/16  3:30 PM  Result Value Ref Range Status   Specimen Description CSF  Final   Special Requests NONE  Final   Gram Stain   Final    DIRECT SMEAR DEGENERATED CELLULAR MATERIAL PRESENT NO WBC SEEN NO ORGANISMS SEEN    Culture NO GROWTH 3 DAYS  Final   Report Status PENDING  Incomplete  Rapid strep screen (not at [facility])     Status: None   Collection Time: 01/01/16  3:32 PM  Result Value Ref Range Status   Streptococcus, Group A Screen (Direct) NEGATIVE NEGATIVE Final    Comment: (NOTE) A Rapid Antigen test may result negative if the antigen level in the sample is below the detection level of this test. The FDA has not cleared this test as a stand-alone test therefore the rapid antigen negative result has reflexed to a Group A Strep culture.   Culture, group A strep     Status: None   Collection Time: 01/01/16  3:32 PM  Result Value Ref Range Status   Specimen Description THROAT  Final   Special Requests NONE  Final   Culture NO GROUP A STREP (S.PYOGENES) ISOLATED  Final   Report Status 01/04/2016 FINAL  Final  Urine culture     Status: Abnormal   Collection Time: 01/01/16  5:16 PM  Result Value Ref Range Status   Specimen Description URINE, CLEAN CATCH  Final   Special Requests NONE  Final   Culture 9,000 COLONIES/mL INSIGNIFICANT GROWTH (A)  Final   Report Status 01/02/2016 FINAL  Final  MRSA PCR Screening     Status: None   Collection Time: 01/02/16  3:46 AM  Result Value Ref Range Status   MRSA by PCR NEGATIVE NEGATIVE Final    Comment:        The GeneXpert MRSA Assay (FDA approved for NASAL specimens only), is one component of a comprehensive MRSA colonization surveillance program. It is not intended to diagnose MRSA infection  nor to guide or monitor treatment for MRSA infections.      Labs: Basic Metabolic Panel:  Recent Labs Lab 01/01/16 1214 01/01/16 1350 01/02/16 0720  NA 132* 132* 137  K 4.3 4.4 4.1  CL 96* 97* 103  CO2 17* 21* 23  GLUCOSE 110* 117* 150*  BUN 18 18 18   CREATININE 1.14 1.33* 0.88  CALCIUM 7.9* 8.4* 8.2*  MG  --   --  2.0  PHOS  --   --  4.1   Liver Function Tests:  Recent Labs Lab 01/01/16 1214 01/01/16 1350  AST 13 18  ALT 9 11*  ALKPHOS 473* 410*  BILITOT 0.7 1.0  PROT 5.4* 6.3*  ALBUMIN 2.8* 2.5*   No results for input(s): LIPASE, AMYLASE in the last 168 hours. No results for input(s): AMMONIA in the last 168 hours. CBC:  Recent Labs Lab 01/01/16 1217 01/01/16 1350 01/02/16 0720 01/03/16 0019  WBC 31.2* 31.1* 29.4* 27.8*  NEUTROABS  --  28.3*  --   --   HGB 14.2 14.4 12.9* 12.6*  HCT 43.2* 46.9 43.4 42.1  MCV 63.0* 66.1* 66.1* 64.1*  PLT  --  150 104* 133*   Cardiac Enzymes: No results for input(s): CKTOTAL, CKMB, CKMBINDEX, TROPONINI in the last 168 hours. BNP: BNP (last 3 results)  Recent Labs  01/01/16 1350  BNP 108.3*    ProBNP (last 3 results) No results for input(s): PROBNP in the last 8760 hours.  CBG: No results for input(s): GLUCAP in the last 168 hours.   Signed:  Velvet Bathe MD.  [facility] 01/04/2016, 12:44 PM

## 2016-01-04 NOTE — Progress Notes (Signed)
Patient ID: Benjamin [name], male   DOB: 1950/10/18, 65 y.o.   MRN: [id number]         [facility] for Infectious Disease    Date of Admission:  01/01/2016   Day 4 vancomycin and meropenem  His CSF studies are not compatible with bacterial meningitis. CSF and blood cultures remain negative. I will stop antibiotics now. He has defervesced. I am comfortable with discharge home.       Michel Bickers, MD [facility] for Infectious [place] [facility] [phone] pager   [phone] cell 08/27/2015, 1:32 PM

## 2016-01-04 NOTE — Progress Notes (Signed)
Pharmacy Antibiotic Note  Benjamin [name] is a 65 y.o. male admitted on 01/01/2016 with sore throat and neck pain ans stiffness. Concerned for meningitis.  Pharmacy has been consulted for Meropenem and Vancomycin dosing. Patient has an allergy of hives to Keflex.   Now day #4 of abx for possible meningitis. ID seeing, his CSF makes it unlikely bacterial meningitis. Also has a rash but has improved on its own. Plan to stop abx when cx is negative. Afebrile, WBC slightly down to 27.8 (31.1 on admit) SCr down to wnl. CrCl ~63ml/min.  Plan: Continue Merrem 2 gm IV Q8H Continue vancomycin 750mg  IV Q8 Monitor clinical picture, renal function, VT prn F/U cx result  If CSF cx remains no growth and is final today could probably stop abx  Height: 6' (182.9 cm) Weight: 175 lb 0.7 oz (79.4 kg) IBW/kg (Calculated) : 77.6  Temp (24hrs), Avg:98.1 F (36.7 C), Min:97.7 F (36.5 C), Max:98.9 F (37.2 C)   Recent Labs Lab 01/01/16 1214 01/01/16 1217 01/01/16 1350 01/01/16 1423 01/01/16 1826 01/02/16 0720 01/03/16 0019  WBC  --  31.2* 31.1*  --   --  29.4* 27.8*  CREATININE 1.14  --  1.33*  --   --  0.88  --   LATICACIDVEN  --   --   --  3.38* 1.14  --   --     Estimated Creatinine Clearance: 90.6 mL/min (by C-G formula based on Cr of 0.88).    Allergies  Allergen Reactions  . Keflex [Cephalexin] Hives  . Morphine And Related Hives    Antimicrobials this admission: 5/10 Vanc >> 5/10 Merrem >>  Dose adjustments this admission: None   Microbiology results: 5/10 CSF >> ngtd 5/10 BCx2 >> ngtd 5/10 UCx >> insignificant growth  Thank you for allowing pharmacy to be a part of this patient's care.  Elenor Quinones, PharmD, BCPS Clinical Pharmacist Pager [phone] 01/04/2016 10:18 AM

## 2016-01-04 NOTE — Care Management Note (Signed)
Case Management Note  Patient Details  Name: Benjamin [name] MRN: [id number] Date of Birth: 01/20/1951  Subjective/Objective:    65 y.o. M admitted 01/01/2016. Active with [facility] prior to admission. Will resume HHRN,HHOT and Aide at discharge pendings MD orders.                 Action/Plan:No further CM needs.    Expected Discharge Date:                  Expected Discharge Plan:  Home/Self Care  In-House Referral:     Discharge planning Services  CM Consult  Post Acute Care Choice:    Choice offered to:     DME Arranged:    DME Agency:     HH Arranged:  RN, OT, Nurse's Aide (Resumption of Care) [place] Agency:  [facility]  Status of Service:  In process, will continue to follow  Medicare Important Message Given:    Date Medicare IM Given:    Medicare IM give by:    Date Additional Medicare IM Given:    Additional Medicare Important Message give by:     If discussed at [place] of Stay Meetings, dates discussed:    Additional Comments:  Delrae Sawyers, RN 01/04/2016, 3:41 PM

## 2016-01-04 NOTE — Progress Notes (Signed)
Went over all d/c instructions with pt. & answered any questions. Dressed with minimal help and pt says wife will be here about 3:30

## 2016-01-04 NOTE — Progress Notes (Signed)
Pt discharged per w/c by Nurse tech with all belongings. Pt taking Incentive spirometer home & highly encouraged pt to continue with this 4 times a day.  Pt has his glasses and clothing. Aware of meds to resume and follow up appointments.

## 2016-01-05 LAB — CSF CULTURE W GRAM STAIN: Culture: NO GROWTH

## 2016-01-05 LAB — CSF CULTURE

## 2016-01-05 LAB — VARICELLA-ZOSTER BY PCR: VARICELLA-ZOSTER, PCR: NEGATIVE

## 2016-01-06 ENCOUNTER — Other Ambulatory Visit: Payer: Medicare Other

## 2016-01-06 ENCOUNTER — Ambulatory Visit: Payer: Medicare Other | Admitting: Hematology & Oncology

## 2016-01-06 LAB — GAMMA GT

## 2016-01-06 LAB — ALKALINE PHOSPHATASE

## 2016-01-07 ENCOUNTER — Telehealth: Payer: Self-pay

## 2016-01-07 LAB — CULTURE, BLOOD (ROUTINE X 2)
Culture: NO GROWTH
Culture: NO GROWTH

## 2016-01-07 NOTE — Telephone Encounter (Signed)
Let it go for now

## 2016-01-07 NOTE — Telephone Encounter (Signed)
Dr. Shann Medal called and stated they would not be able to run the alkaline and GGT for this patient that they do not have enough specimen. What would you like to do?

## 2016-01-10 ENCOUNTER — Telehealth: Payer: Self-pay | Admitting: Internal Medicine

## 2016-01-10 NOTE — Telephone Encounter (Signed)
Pt scheduled to see Dr. Henrene Pastor 01/13/16@2 :30pm. Pt was hospitalized recently for perforation in stomach, found to have H pylori. Pt state the meds he took for h pylori made him sick and he wound up back int he hospital. Would like to see Dr. Henrene Pastor to be tested to see if infection gone. Pt aware of appt.

## 2016-01-13 ENCOUNTER — Encounter: Payer: Self-pay | Admitting: Internal Medicine

## 2016-01-13 ENCOUNTER — Ambulatory Visit (INDEPENDENT_AMBULATORY_CARE_PROVIDER_SITE_OTHER): Payer: Medicare Other | Admitting: Internal Medicine

## 2016-01-13 VITALS — BP 108/66 | HR 84 | Ht 72.0 in | Wt 153.1 lb

## 2016-01-13 DIAGNOSIS — Z8619 Personal history of other infectious and parasitic diseases: Secondary | ICD-10-CM

## 2016-01-13 DIAGNOSIS — Z8719 Personal history of other diseases of the digestive system: Secondary | ICD-10-CM

## 2016-01-13 DIAGNOSIS — Z86010 Personal history of colonic polyps: Secondary | ICD-10-CM

## 2016-01-13 DIAGNOSIS — Z8711 Personal history of peptic ulcer disease: Secondary | ICD-10-CM

## 2016-01-13 LAB — B. BURGDORFI ANTIBODIES, CSF
ALBUMIN INDEX RATIO: 5.8
Albumin CSF: 14.4 mg/dL (ref 10.4–43.2)
Albumin Serum: 2500 mg/dl — ABNORMAL LOW (ref 3700–5100)
B. BURGDORFERI IGM SERUM: 0.99 {index} — AB (ref 0.00–0.80)
B. Burgdorferi IgG Serum: 0.35 Index (ref 0.00–0.80)
B. burgdorferi IgM CSF: <0.06 Index (ref 0.00–0.06)
IGG INDEX: 0.41
IGG TOTAL CSF: 1.9 mg/dL (ref 0.00–3.06)
IGG TOTAL SERUM: 803 mg/dL (ref 540–1423)
IGM INDEX: 2.1
IGM TOTAL SERUM: 132 mg/dL (ref 52–217)
IgM Total CSF: <1.6 mg/dL (ref 0.0–1.6)

## 2016-01-13 MED ORDER — NA SULFATE-K SULFATE-MG SULF 17.5-3.13-1.6 GM/177ML PO SOLN: 1.0000 | Freq: Once | ORAL | Status: DC

## 2016-01-13 NOTE — Progress Notes (Signed)
HISTORY OF PRESENT ILLNESS:  Benjamin [name] is a 65 y.o. 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 He was found to have positive Helicobacter pylori antibody for which she was treated with combination of pantoprazole, metronidazole, and clarithromycin twice daily. He completed 9 days of therapy and was rehospitalized 01/01/2016 for unspecified "sepsis" with leukocytosis. Extensive workup was negative including infectious disease consultation. He was discharged home on 01/04/2016. He has been off aspirin. He had not been on PPI but is currently on pantoprazole. He had not been using other NSAIDs. His last colonoscopy was February 2016 with 12 polyps or both adenomatous and sessile serrated without dysplasia. Follow-up in 1 year recommended. He did have remote upper endoscopy in 2006 which was normal. Testing for H. pylori was performed (results uncertain). His last CBC 01/03/2016 revealed white blood cell count 27.8, hemoglobin 12.6, platelet count 133,000. Patient has no active GI complaints. He has had improving appetite and strength. His weight loss has stabilized. He has multiple questions regarding his condition and diet.  REVIEW OF SYSTEMS:  All non-GI ROS negative except for allergy, fatigue, shortness of breath, ankle swelling  Past Medical History  Diagnosis Date  . 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 Polycythemia   . Anemia   . DVT (deep venous thrombosis) ([name])     hx -lt leg-dx polycythemia  . Focal dystonia   . Clotting disorder ([place])   . Perforated bowel ([place])   . Sepsis ([place])   . Colon polyps   . GERD (gastroesophageal reflux disease)     Past Surgical History  Procedure Laterality Date  . Wisdom tooth extraction    . Finger arthroplasty  1998    rt index  .  Inguinal hernia repair  2007    left  . Inguinal hernia repair  2009    right  . Colonoscopy    . Dupuytren contracture release Left 08/08/2013    Procedure: DUPUYTREN CONTRACTURE RELEASE LEFT HAND;  Surgeon: Cammie Sickle., MD;  Location: [facility];  Service: Orthopedics;  Laterality: Left;  . Colonoscopy    . Hernia repair    . Laparotomy N/A 12/15/2015    Procedure: EXPLORATORY LAPAROTOMY;  Surgeon: Leighton Ruff, MD;  Location: [facility];  Service: General;  Laterality: N/A;    Social History Haroldine Laws  reports that he has never smoked. He has never used smokeless tobacco. He reports that he drinks alcohol. He reports that he does not use illicit drugs.  family history includes Diabetes in his brother and father; Heart disease in his father; Hyperlipidemia in his brother and father; Hypertension in his father; Prostate cancer in his brother. There is no history of Colon cancer.  Allergies  Allergen Reactions  . Keflex [Cephalexin] Hives  . Biaxin [Clarithromycin]     Joint pains  . Flagyl [Metronidazole] Other (See Comments)    Joint pain  . Morphine And Related Hives       PHYSICAL EXAMINATION: Vital signs: BP 108/66 mmHg  Pulse 84  Ht 6' (1.829 m)  Wt 153 lb 2 oz (69.457 kg)  BMI 20.76 kg/m2  Constitutional: Pleasant, generally well-appearing, no acute distress Psychiatric: alert and oriented x3, cooperative Eyes: extraocular movements intact, anicteric, conjunctiva pink Mouth: oral pharynx moist, no lesions Neck: supple without thyromegaly  Lymph: no lymphadenopathy Cardiovascular: heart regular rate and rhythm, no murmur Lungs: clear to auscultation bilaterally Abdomen: soft, nontender, nondistended, no obvious ascites, no peritoneal signs, normal bowel sounds, no organomegaly. Surgical wound site bandaged Rectal: Deferred until colonoscopy Extremities: no clubbing cyanosis or lower extremity edema bilaterally Skin: no lesions on visible  extremities Neuro: No focal deficits. Normal DTRs  ASSESSMENT:  #1. Recent perforated gastric ulcer. His factors include chronic daily aspirin use off PPI and possible H. pylori infection #2. History of multiple adenomatous colon polyps. Due for follow-up surveillance #3. History of polycythemia vera. Now off aspirin. Followed by Dr. Marin Olp   PLAN:  #1. Continue pantoprazole #2. Schedule surveillance colonoscopy.The nature of the procedure, as well as the risks, benefits, and alternatives were carefully and thoroughly reviewed with the patient. Ample time for discussion and questions allowed. The patient understood, was satisfied, and agreed to proceed. #3. Upper endoscopy at that time to rule out ongoing H. pylori infection. Rechecking antibody of no value as this will remain positive indefinitely.The nature of the procedure, as well as the risks, benefits, and alternatives were carefully and thoroughly reviewed with the patient. Ample time for discussion and questions allowed. The patient understood, was satisfied, and agreed to proceed.

## 2016-01-13 NOTE — Patient Instructions (Signed)

## 2016-02-03 ENCOUNTER — Encounter: Payer: Medicare Other | Admitting: Internal Medicine

## 2016-02-06 ENCOUNTER — Other Ambulatory Visit (HOSPITAL_BASED_OUTPATIENT_CLINIC_OR_DEPARTMENT_OTHER): Payer: Medicare Other

## 2016-02-06 ENCOUNTER — Ambulatory Visit (HOSPITAL_BASED_OUTPATIENT_CLINIC_OR_DEPARTMENT_OTHER): Payer: Medicare Other | Admitting: Hematology & Oncology

## 2016-02-06 VITALS — BP 133/66 | HR 88 | Temp 97.7°F | Resp 18 | Ht 72.0 in | Wt 156.0 lb

## 2016-02-06 DIAGNOSIS — D45 Polycythemia vera: Secondary | ICD-10-CM

## 2016-02-06 LAB — CBC WITH DIFFERENTIAL (CANCER CENTER ONLY)
BASO#: 0.1 10*3/uL (ref 0.0–0.2)
BASO%: 0.7 % (ref 0.0–2.0)
EOS ABS: 0.3 10*3/uL (ref 0.0–0.5)
EOS%: 1.7 % (ref 0.0–7.0)
HEMATOCRIT: 45.4 % (ref 38.7–49.9)
HEMOGLOBIN: 13.7 g/dL (ref 13.0–17.1)
LYMPH#: 1.8 10*3/uL (ref 0.9–3.3)
LYMPH%: 8.9 % — ABNORMAL LOW (ref 14.0–48.0)
MCH: 21.5 pg — AB (ref 28.0–33.4)
MCHC: 30.2 g/dL — AB (ref 32.0–35.9)
MCV: 71 fL — AB (ref 82–98)
MONO#: 0.6 10*3/uL (ref 0.1–0.9)
MONO%: 2.9 % (ref 0.0–13.0)
NEUT%: 85.8 % — ABNORMAL HIGH (ref 40.0–80.0)
NEUTROS ABS: 17.6 10*3/uL — AB (ref 1.5–6.5)
Platelets: 252 10*3/uL (ref 145–400)
RBC: 6.36 10*6/uL — AB (ref 4.20–5.70)
RDW: 24.1 % — ABNORMAL HIGH (ref 11.1–15.7)
WBC: 20.5 10*3/uL — AB (ref 4.0–10.0)

## 2016-02-06 LAB — TECHNOLOGIST REVIEW CHCC SATELLITE

## 2016-02-06 NOTE — Progress Notes (Signed)
Hematology and Oncology Follow Up Visit  Benjamin [name] [id number] Dec 09, 1950 65 y.o. 06/20/2014   Principle Diagnosis:  Polycythemia vera -- JAK2 positive. Perforated gastric ulcer-H pylori positive  Current Therapy:   1. Phlebotomy to maintain hematocrit below 45%.      Interim History:  Mr.  Benjamin [name] is back for followup. Surprisingly enough, he had a perforated ulcer. This happened in April. He was found to have H. pylori. He also was on aspirin. He had taken a nonsteroidal that week also. I think it was the "perfect storm" that led to this.  His recovery has been fantastic. He looks great. 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 He is eating. He lost a little bit of weight during this whole event. He has had no positive bowels or bladder.  He is off aspirin and right now.  He sees Dr. Henrene Pastor of gastroenterology in July. He will have another endoscopy. If everything looks okay, we will plan to get him back on 2 baby aspirin.  He has had no nausea vomiting. He's had no cough. He has liver rash but this appears to be improving.  Overall, his performance status is ECOG is 0.   Medications: Current outpatient prescriptions:aspirin 325 MG tablet, Take 325 mg by mouth daily.  , Disp: , Rfl: ;  cholecalciferol (VITAMIN D) 1000 UNITS tablet, Take 1,000 Units by mouth daily., Disp: , Rfl: ;  Magnesium 400 MG CAPS, Take by mouth every morning., Disp: , Rfl: ;  NON FORMULARY, as needed. Horse Chestnut tab, Disp: , Rfl: ;  NON FORMULARY, Apply topically every morning. MOTOQ-- IT IS A CO-Q 10 CREAM THAT HE USES FOR ROSACEA., Disp: , Rfl:  Omega-3 Fatty Acids (FISH OIL) 1000 MG CAPS, Take by mouth every morning., Disp: , Rfl: ;  sildenafil (VIAGRA) 25 MG tablet, Take 2-5 tablets as needed for sexual activity, Disp: 50 tablet, Rfl: 0;  sulfamethoxazole-trimethoprim (BACTRIM DS) 800-160 MG per tablet, Take 1 tablet by mouth 2 (two) times daily., Disp: , Rfl: ;  Vitamin E 400 UNITS TABS, Take by  mouth every morning., Disp: , Rfl:   Allergies: No Known Allergies  Past Medical History, Surgical history, Social history, and Family History were reviewed and updated.  Review of Systems: As above  Physical Exam:  height is 6\' 2"  (1.88 m) and weight is 177 lb (80.287 kg). His oral temperature is 97.7 F (36.5 C). His blood pressure is 132/70 and his pulse is 75. His respiration is 18.   Well-developed and well-nourished white male.  Head and neck exam shows no ocular or oral lesions. He has no palpable cervical or supra-clavicular lymph nodes. Lungs are clear. Cardiac exam regular rate and rhythm. There are no murmurs, rubs or bruits.  Abdomen is soft. He has a healed laparotomy scar. He has good bowel sounds. There is no fluid wave. There is no palpable liver or spleen tip. Extremities shows no clubbing cyanosis or edema. Has good range motion of his joints. Skin exam no rashes, ecchymosis or petechia. He does have rosacea on his face. Neurological exam is nonfocal.  Lab Results  Component Value Date   WBC 16.2* 06/20/2014   HGB 14.1 06/20/2014   HCT 46.2 06/20/2014   MCV 70* 06/20/2014   PLT 229 06/20/2014     Chemistry      Component Value Date/Time   NA 136 04/04/2014 1520   K 4.4 04/04/2014 1520   CL 103 04/04/2014 1520   CO2 22  04/04/2014 1520   BUN 18 04/04/2014 1520   CREATININE 1.07 04/04/2014 1520      Component Value Date/Time   CALCIUM 8.6 04/04/2014 1520   ALKPHOS 691* 04/04/2014 1520   AST 29 04/04/2014 1520   ALT 28 04/04/2014 1520   BILITOT 1.0 04/04/2014 1520         Impression and Plan: [name] is 65 year old white male with polycythemia vera. We have been following him for over 10 years. He is done incredibly well. He really has had no complications.  Again I'm still not sure if the aspirin was the real factor. I'm sure that it helped contribute along with the H. pylori infection and him taking nonsteroidals that week.  His hematocrit is a little bit on  the higher side. However, I don't think we should phlebotomize him right now. This might be a little bit too much for him.  I think we've probably get him back in 2 months. By then, we will know the results of his endoscopy. We will see if the H. pylori has been treated. And again, a very looks good, we might be able to get back on baby aspirin.   Volanda Napoleon, MD 10/28/20151:01 PM

## 2016-02-07 LAB — IRON AND TIBC
%SAT: 5 % — ABNORMAL LOW (ref 20–55)
Iron: 16 ug/dL — ABNORMAL LOW (ref 42–163)
TIBC: 305 ug/dL (ref 202–409)
UIBC: 289 ug/dL (ref 117–376)

## 2016-02-07 LAB — FERRITIN: Ferritin: 22 ng/ml (ref 22–316)

## 2016-03-05 ENCOUNTER — Encounter: Payer: Self-pay | Admitting: Internal Medicine

## 2016-03-18 ENCOUNTER — Encounter: Payer: Self-pay | Admitting: Internal Medicine

## 2016-03-18 ENCOUNTER — Ambulatory Visit (INDEPENDENT_AMBULATORY_CARE_PROVIDER_SITE_OTHER): Payer: Medicare Other | Admitting: Internal Medicine

## 2016-03-18 VITALS — BP 111/63 | HR 69 | Temp 97.1°F | Resp 15 | Ht 72.0 in | Wt 156.0 lb

## 2016-03-18 DIAGNOSIS — Z86010 Personal history of colonic polyps: Secondary | ICD-10-CM | POA: Diagnosis not present

## 2016-03-18 DIAGNOSIS — K635 Polyp of colon: Secondary | ICD-10-CM

## 2016-03-18 DIAGNOSIS — Z8719 Personal history of other diseases of the digestive system: Secondary | ICD-10-CM

## 2016-03-18 DIAGNOSIS — Z8619 Personal history of other infectious and parasitic diseases: Secondary | ICD-10-CM

## 2016-03-18 DIAGNOSIS — D122 Benign neoplasm of ascending colon: Secondary | ICD-10-CM

## 2016-03-18 DIAGNOSIS — Z8711 Personal history of peptic ulcer disease: Secondary | ICD-10-CM

## 2016-03-18 NOTE — Op Note (Signed)
[facility] Patient Name: Benjamin [name] Procedure Date: 03/18/2016 11:00 AM MRN: [id number] Endoscopist: Docia Chuck. Henrene Pastor , MD Age: 65 Referring MD:  Date of Birth: 01/03/1951 Gender: Male Account #: [id number] Procedure:                Upper GI endoscopy, with biopsy Indications:              Postoperative assessment. Perforated duodenal ulcer                            with [name] patch placement. Treated for                            Helicobacter pylori. On PPI Medicines:                Monitored Anesthesia Care Procedure:                Pre-Anesthesia Assessment:                           - Prior to the procedure, a History and Physical                            was performed, and patient medications and                            allergies were reviewed. The patient's tolerance of                            previous anesthesia was also reviewed. The risks                            and benefits of the procedure and the sedation                            options and risks were discussed with the patient.                            All questions were answered, and informed consent                            was obtained. Prior Anticoagulants: The patient has                            taken no previous anticoagulant or antiplatelet                            agents. ASA Grade Assessment: II - A patient with                            mild systemic disease. After reviewing the risks                            and benefits, the patient was deemed in  satisfactory condition to undergo the procedure.                           After obtaining informed consent, the endoscope was                            passed under direct vision. Throughout the                            procedure, the patient's blood pressure, pulse, and                            oxygen saturations were monitored continuously. The                            Model GIF-HQ190  [id number]) scope was introduced                            through the mouth, and advanced to the second part                            of duodenum. The upper GI endoscopy was                            accomplished without difficulty. The patient                            tolerated the procedure well. Scope In: Scope Out: Findings:                 The esophagus was normal.                           The stomach was normal. Biopsies were taken with a                            cold forceps for Helicobacter pylori testing using                            CLOtest.                           The examined duodenum was normal.                           The cardia and gastric fundus were normal on                            retroflexion. Complications:            No immediate complications. Estimated Blood Loss:     Estimated blood loss: none. Impression:               - Normal esophagus.                           - Normal stomach. Biopsied.                           -  Normal examined duodenum. Recommendation:           - Patient has a contact number available for                            emergencies. The signs and symptoms of potential                            delayed complications were discussed with the                            patient. Return to normal activities tomorrow.                            Written discharge instructions were provided to the                            patient.                           - Resume previous diet.                           - Continue present medications. Docia Chuck. Henrene Pastor, MD 03/18/2016 12:01:28 PM This report has been signed electronically.

## 2016-03-18 NOTE — Progress Notes (Signed)
A/ox3 pleased with MAC, report to Robbin RN 

## 2016-03-18 NOTE — Patient Instructions (Signed)
Colon polyp x 1 removed today. Handout given on polyps. Repeat colonoscopy in 3 years. Biopsies in stomach for h.pylori, office staff will contact you if the test is positive.  Polyp result letter in your mail in 2-3 weeks. Resume current medications. Call us with any questions or concerns. Thank you!  YOU HAD AN ENDOSCOPIC PROCEDURE TODAY AT [facility]:   Refer to the procedure report that was given to you for any specific questions about what was found during the examination.  If the procedure report does not answer your questions, please call your gastroenterologist to clarify.  If you requested that your care partner not be given the details of your procedure findings, then the procedure report has been included in a sealed envelope for you to review at your convenience later.  YOU SHOULD EXPECT: Some feelings of bloating in the abdomen. Passage of more gas than usual.  Walking can help get rid of the air that was put into your GI tract during the procedure and reduce the bloating. If you had a lower endoscopy (such as a colonoscopy or flexible sigmoidoscopy) you may notice spotting of blood in your stool or on the toilet paper. If you underwent a bowel prep for your procedure, you may not have a normal bowel movement for a few days.  Please Note:  You might notice some irritation and congestion in your nose or some drainage.  This is from the oxygen used during your procedure.  There is no need for concern and it should clear up in a day or so.  SYMPTOMS TO REPORT IMMEDIATELY:   Following lower endoscopy (colonoscopy or flexible sigmoidoscopy):  Excessive amounts of blood in the stool  Significant tenderness or worsening of abdominal pains  Swelling of the abdomen that is new, acute  Fever of 100F or higher   Following upper endoscopy (EGD)  Vomiting of blood or coffee ground material  New chest pain or pain under the shoulder blades  Painful or persistently difficult  swallowing  New shortness of breath  Fever of 100F or higher  Black, tarry-looking stools  For urgent or emergent issues, a gastroenterologist can be reached at any hour by calling [phone].   DIET: Your first meal following the procedure should be a small meal and then it is ok to progress to your normal diet. Heavy or fried foods are harder to digest and may make you feel nauseous or bloated.  Likewise, meals heavy in dairy and vegetables can increase bloating.  Drink plenty of fluids but you should avoid alcoholic beverages for 24 hours.  ACTIVITY:  You should plan to take it easy for the rest of today and you should NOT DRIVE or use heavy machinery until tomorrow (because of the sedation medicines used during the test).    FOLLOW UP: Our staff will call the number listed on your records the next business day following your procedure to check on you and address any questions or concerns that you may have regarding the information given to you following your procedure. If we do not reach you, we will leave a message.  However, if you are feeling well and you are not experiencing any problems, there is no need to return our call.  We will assume that you have returned to your regular daily activities without incident.  If any biopsies were taken you will be contacted by phone or by letter within the next 1-3 weeks.  Please call us at [phone]  [phone] if you have not heard about the biopsies in 3 weeks.    SIGNATURES/CONFIDENTIALITY: You and/or your care partner have signed paperwork which will be entered into your electronic medical record.  These signatures attest to the fact that that the information above on your After Visit Summary has been reviewed and is understood.  Full responsibility of the confidentiality of this discharge information lies with you and/or your care-partner.

## 2016-03-18 NOTE — Progress Notes (Signed)
Called to room to assist during endoscopic procedure.  Patient ID and intended procedure confirmed with present staff. Received instructions for my participation in the procedure from the performing physician.  

## 2016-03-18 NOTE — Op Note (Signed)
[place] Patient Name: Benjamin [name] Procedure Date: 03/18/2016 11:01 AM MRN: [id number] Endoscopist: Docia Chuck. Henrene Pastor , MD Age: 65 Referring MD:  Date of Birth: [birth year] Gender: Male Account #: [id number] Procedure:                Colonoscopy, with cold snare polypectomy -1 Indications:              High risk colon cancer surveillance: Personal                            history of multiple (3 or more) adenomas. Index                            examination 2006 with tubular adenomas. Subsequent                            examination 09-2014 with 12 polyps (tubular adenomas                            and SSP) Medicines:                Monitored Anesthesia Care Procedure:                Pre-Anesthesia Assessment:                           - Prior to the procedure, a History and Physical                            was performed, and patient medications and                            allergies were reviewed. The patient's tolerance of                            previous anesthesia was also reviewed. The risks                            and benefits of the procedure and the sedation                            options and risks were discussed with the patient.                            All questions were answered, and informed consent                            was obtained. Prior Anticoagulants: The patient has                            taken no previous anticoagulant or antiplatelet                            agents. ASA Grade Assessment: II - A patient with  mild systemic disease. After reviewing the risks                            and benefits, the patient was deemed in                            satisfactory condition to undergo the procedure.                           After obtaining informed consent, the colonoscope                            was passed under direct vision. Throughout the                            procedure, the patient's  blood pressure, pulse, and                            oxygen saturations were monitored continuously. The                            Model CF-HQ190L [id number]) scope was introduced                            through the anus and advanced to the the cecum,                            identified by appendiceal orifice and ileocecal                            valve. The ileocecal valve, appendiceal orifice,                            and rectum were photographed. The quality of the                            bowel preparation was excellent. The colonoscopy                            was performed without difficulty. The patient                            tolerated the procedure well. The bowel preparation                            used was SUPREP. Scope In: 11:04:51 AM Scope Out: 11:26:08 AM Scope Withdrawal Time: 0 hours 16 minutes 8 seconds  Total Procedure Duration: 0 hours 21 minutes 17 seconds  Findings:                 A 5 mm polyp was found in the ascending colon. The                            polyp was removed with a cold snare. Resection and  retrieval were complete.                           Internal hemorrhoids were found during retroflexion.                           The exam was otherwise without abnormality on                            direct and retroflexion views. Complications:            No immediate complications. Estimated blood loss:                            None. Estimated Blood Loss:     Estimated blood loss: none. Impression:               - One 5 mm polyp in the ascending colon, removed                            with a cold snare. Resected and retrieved.                           - Internal hemorrhoids.                           - The examination was otherwise normal on direct                            and retroflexion views. Recommendation:           - Repeat colonoscopy in 3 years for surveillance.                           -  Patient has a contact number available for                            emergencies. The signs and symptoms of potential                            delayed complications were discussed with the                            patient. Return to normal activities tomorrow.                            Written discharge instructions were provided to the                            patient.                           - Resume previous diet.                           - Continue present medications.                           -  Await pathology results. Docia Chuck. Henrene Pastor, MD 03/18/2016 11:56:44 AM This report has been signed electronically.

## 2016-03-19 ENCOUNTER — Telehealth: Payer: Self-pay

## 2016-03-19 NOTE — Telephone Encounter (Signed)
Left message on answering machine. 

## 2016-03-20 LAB — HELICOBACTER PYLORI SCREEN-BIOPSY: UREASE: NEGATIVE

## 2016-03-23 ENCOUNTER — Encounter: Payer: Self-pay | Admitting: Internal Medicine

## 2016-04-02 ENCOUNTER — Encounter: Payer: Self-pay | Admitting: Hematology & Oncology

## 2016-04-02 ENCOUNTER — Other Ambulatory Visit (HOSPITAL_BASED_OUTPATIENT_CLINIC_OR_DEPARTMENT_OTHER): Payer: Medicare Other

## 2016-04-02 ENCOUNTER — Ambulatory Visit (HOSPITAL_BASED_OUTPATIENT_CLINIC_OR_DEPARTMENT_OTHER): Payer: Medicare Other | Admitting: Hematology & Oncology

## 2016-04-02 ENCOUNTER — Ambulatory Visit (HOSPITAL_BASED_OUTPATIENT_CLINIC_OR_DEPARTMENT_OTHER): Payer: Medicare Other

## 2016-04-02 VITALS — BP 135/57 | HR 72 | Temp 97.6°F | Resp 18 | Ht 72.0 in | Wt 165.0 lb

## 2016-04-02 DIAGNOSIS — Z7982 Long term (current) use of aspirin: Secondary | ICD-10-CM

## 2016-04-02 DIAGNOSIS — D45 Polycythemia vera: Secondary | ICD-10-CM

## 2016-04-02 LAB — COMPREHENSIVE METABOLIC PANEL
ALBUMIN: 3.8 g/dL (ref 3.5–5.0)
ALK PHOS: 901 U/L — AB (ref 40–150)
ALT: 29 U/L (ref 0–55)
AST: 32 U/L (ref 5–34)
Anion Gap: 8 mEq/L (ref 3–11)
BUN: 16.1 mg/dL (ref 7.0–26.0)
CO2: 29 mEq/L (ref 22–29)
Calcium: 9.8 mg/dL (ref 8.4–10.4)
Chloride: 104 mEq/L (ref 98–109)
Creatinine: 1 mg/dL (ref 0.7–1.3)
EGFR: 76 mL/min/{1.73_m2} — ABNORMAL LOW (ref >90)
GLUCOSE: 131 mg/dL (ref 70–140)
POTASSIUM: 5.2 meq/L — AB (ref 3.5–5.1)
SODIUM: 141 meq/L (ref 136–145)
Total Bilirubin: 0.99 mg/dL (ref 0.20–1.20)
Total Protein: 6.9 g/dL (ref 6.4–8.3)

## 2016-04-02 LAB — CBC WITH DIFFERENTIAL (CANCER CENTER ONLY)
BASO#: 0.2 10*3/uL (ref 0.0–0.2)
BASO%: 0.8 % (ref 0.0–2.0)
EOS ABS: 0.2 10*3/uL (ref 0.0–0.5)
EOS%: 0.9 % (ref 0.0–7.0)
HEMATOCRIT: 47.6 % (ref 38.7–49.9)
HEMOGLOBIN: 14.2 g/dL (ref 13.0–17.1)
LYMPH#: 1.5 10*3/uL (ref 0.9–3.3)
LYMPH%: 7.7 % — ABNORMAL LOW (ref 14.0–48.0)
MCH: 20.4 pg — AB (ref 28.0–33.4)
MCHC: 29.8 g/dL — AB (ref 32.0–35.9)
MCV: 69 fL — AB (ref 82–98)
MONO#: 0.5 10*3/uL (ref 0.1–0.9)
MONO%: 2.7 % (ref 0.0–13.0)
NEUT%: 87.9 % — ABNORMAL HIGH (ref 40.0–80.0)
NEUTROS ABS: 17.5 10*3/uL — AB (ref 1.5–6.5)
Platelets: 271 10*3/uL (ref 145–400)
RBC: 6.95 10*6/uL — ABNORMAL HIGH (ref 4.20–5.70)
RDW: 22.4 % — ABNORMAL HIGH (ref 11.1–15.7)
WBC: 19.9 10*3/uL — AB (ref 4.0–10.0)

## 2016-04-02 LAB — TECHNOLOGIST REVIEW CHCC SATELLITE

## 2016-04-02 NOTE — Progress Notes (Signed)
Benjamin [name] presents today for phlebotomy per MD orders. Phlebotomy procedure started at 1347 and ended at 1303. Approximately 500 mls removed. Patient observed for 30 minutes after procedure without any incident. Patient tolerated procedure well. IV needle removed intact.

## 2016-04-02 NOTE — Progress Notes (Signed)
Hematology and Oncology Follow Up Visit  Benjamin [name] [id number] 1950-12-24 65 y.o. 06/20/2014   Principle Diagnosis:  Polycythemia vera -- JAK2 positive. Perforated gastric ulcer-H pylori positive  Current Therapy:   1. Phlebotomy to maintain hematocrit below 45%.      Interim History:  Benjamin [name] is back for followup. He looks great. He is feeling good. He has low bit of abdominal discomfort where he had his surgery back in April. He had a perforated ulcer.  He had a repeat upper endoscopy. Everything looked okay. He had a polyp that was removed and was non-malignant.  I think that he go back onto baby aspirin now. We have had him off the baby aspirin until he has had another endoscopy.   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   His appetite is good. He's had no problems with nausea or vomiting.   He's had no problems with rashes. He's had no issues with fever.  Overall, his performance status is ECOG is 0.   Medications: Current outpatient prescriptions:aspirin 325 MG tablet, Take 325 mg by mouth daily.  , Disp: , Rfl: ;  cholecalciferol (VITAMIN D) 1000 UNITS tablet, Take 1,000 Units by mouth daily., Disp: , Rfl: ;  Magnesium 400 MG CAPS, Take by mouth every morning., Disp: , Rfl: ;  NON FORMULARY, as needed. Horse Chestnut tab, Disp: , Rfl: ;  NON FORMULARY, Apply topically every morning. MOTOQ-- IT IS A CO-Q 10 CREAM THAT HE USES FOR ROSACEA., Disp: , Rfl:  Omega-3 Fatty Acids (FISH OIL) 1000 MG CAPS, Take by mouth every morning., Disp: , Rfl: ;  sildenafil (VIAGRA) 25 MG tablet, Take 2-5 tablets as needed for sexual activity, Disp: 50 tablet, Rfl: 0;  sulfamethoxazole-trimethoprim (BACTRIM DS) 800-160 MG per tablet, Take 1 tablet by mouth 2 (two) times daily., Disp: , Rfl: ;  Vitamin E 400 UNITS TABS, Take by mouth every morning., Disp: , Rfl:   Allergies: No Known Allergies  Past Medical History, Surgical  history, Social history, and Family History were reviewed and updated.  Review of Systems: As above  Physical Exam:  height is 6\' 2"  (1.88 m) and weight is 177 lb (80.287 kg). His oral temperature is 97.7 F (36.5 C). His blood pressure is 132/70 and his pulse is 75. His respiration is 18.   Well-developed and well-nourished white male.  Head and neck exam shows no ocular or oral lesions. He has no palpable cervical or supra-clavicular lymph nodes. Lungs are clear. Cardiac exam regular rate and rhythm. There are no murmurs, rubs or bruits.  Abdomen is soft. He has a healed laparotomy scar. He has good bowel sounds. There is no fluid wave. There is no palpable liver or spleen tip. Extremities shows no clubbing cyanosis or edema. Has good range motion of his joints. Skin exam no rashes, ecchymosis or petechia. He does have rosacea on his face. Neurological exam is nonfocal.  Lab Results  Component Value Date   WBC 16.2* 06/20/2014   HGB 14.1 06/20/2014   HCT 46.2 06/20/2014   MCV 70* 06/20/2014   PLT 229 06/20/2014     Chemistry      Component Value Date/Time   NA 136 04/04/2014 1520   K 4.4 04/04/2014 1520   CL 103 04/04/2014 1520   CO2 22 04/04/2014 1520   BUN 18 04/04/2014 1520   CREATININE 1.07 04/04/2014 1520  Component Value Date/Time   CALCIUM 8.6 04/04/2014 1520   ALKPHOS 691* 04/04/2014 1520   AST 29 04/04/2014 1520   ALT 28 04/04/2014 1520   BILITOT 1.0 04/04/2014 1520         Impression and Plan: Benjamin [name] is 65 year old white male with polycythemia vera. We have been following him for over 10 years. He is done incredibly well. He really has had no complications.  I will go ahead and get him back on baby aspirin. Again one baby aspirin a day I think would be reasonable for him. I think he will benefit from this with his polycythemia. I told him to make sure he takes aspirin with food.  We will go ahead and phlebotomize him today.  We will probably get him back in  about 6 weeks now and see how he is doing.Marland Kitchen   Volanda Napoleon, MD 10/28/20151:01 PM

## 2016-04-02 NOTE — Patient Instructions (Signed)
Therapeutic Phlebotomy, Care After  Refer to this sheet in the next few weeks. These instructions provide you with information about caring for yourself after your procedure. Your health care provider may also give you more specific instructions. Your treatment has been planned according to current medical practices, but problems sometimes occur. Call your health care provider if you have any problems or questions after your procedure.  WHAT TO EXPECT AFTER THE PROCEDURE  After your procedure, it is common to have:   Light-headedness or dizziness. You may feel faint.   Nausea.   Tiredness.  HOME CARE INSTRUCTIONS  Activities   Return to your normal activities as directed by your health care provider. Most people can go back to their normal activities right away.   Avoid strenuous physical activity and heavy lifting or pulling for about 5 hours after the procedure. Do not lift anything that is heavier than 10 lb (4.5 kg).   Athletes should avoid strenuous exercise for at least 12 hours.   Change positions slowly for the remainder of the day. This will help to prevent light-headedness or fainting.   If you feel light-headed, lie down until the feeling goes away.  Eating and Drinking   Be sure to eat well-balanced meals for the next 24 hours.   Drink enough fluid to keep your urine clear or pale yellow.   Avoid drinking alcohol on the day that you had the procedure.  Care of the Needle Insertion Site   Keep your bandage dry. You can remove the bandage after about 5 hours or as directed by your health care provider.   If you have bleeding from the needle insertion site, elevate your arm and press firmly on the site until the bleeding stops.   If you have bruising at the site, apply ice to the area:   Put ice in a plastic bag.   Place a towel between your skin and the bag.   Leave the ice on for 20 minutes, 2-3 times a day for the first 24 hours.   If the swelling does not go away after 24 hours, apply  a warm, moist washcloth to the area for 20 minutes, 2-3 times a day.  General Instructions   Avoid smoking for at least 30 minutes after the procedure.   Keep all follow-up visits as directed by your health care provider. It is important to continue with further therapeutic phlebotomy treatments as directed.  SEEK MEDICAL CARE IF:   You have redness, swelling, or pain at the needle insertion site.   You have fluid, blood, or pus coming from the needle insertion site.   You feel light-headed, dizzy, or nauseated, and the feeling does not go away.   You notice new bruising at the needle insertion site.   You feel weaker than normal.   You have a fever or chills.  SEEK IMMEDIATE MEDICAL CARE IF:   You have severe nausea or vomiting.   You have chest pain.   You have trouble breathing.    This information is not intended to replace advice given to you by your health care provider. Make sure you discuss any questions you have with your health care provider.    Document Released: 01/12/2011 Document Revised: 12/25/2014 Document Reviewed: 08/06/2014  Elsevier Interactive Patient Education 2016 [employer]

## 2016-04-03 LAB — IRON AND TIBC
%SAT: 4 % — ABNORMAL LOW (ref 20–55)
IRON: 16 ug/dL — AB (ref 42–163)
TIBC: 374 ug/dL (ref 202–409)
UIBC: 358 ug/dL (ref 117–376)

## 2016-04-03 LAB — FERRITIN: FERRITIN: 18 ng/mL — AB (ref 22–316)

## 2016-05-14 ENCOUNTER — Encounter: Payer: Self-pay | Admitting: Hematology & Oncology

## 2016-05-14 ENCOUNTER — Other Ambulatory Visit (HOSPITAL_BASED_OUTPATIENT_CLINIC_OR_DEPARTMENT_OTHER): Payer: Medicare Other

## 2016-05-14 ENCOUNTER — Ambulatory Visit (HOSPITAL_BASED_OUTPATIENT_CLINIC_OR_DEPARTMENT_OTHER): Payer: Medicare Other | Admitting: Hematology & Oncology

## 2016-05-14 ENCOUNTER — Encounter: Payer: Self-pay | Admitting: *Deleted

## 2016-05-14 VITALS — BP 148/66 | HR 76 | Temp 97.3°F | Resp 16 | Ht 72.0 in | Wt 169.4 lb

## 2016-05-14 DIAGNOSIS — K255 Chronic or unspecified gastric ulcer with perforation: Secondary | ICD-10-CM | POA: Diagnosis not present

## 2016-05-14 DIAGNOSIS — D45 Polycythemia vera: Secondary | ICD-10-CM | POA: Diagnosis not present

## 2016-05-14 LAB — CBC WITH DIFFERENTIAL (CANCER CENTER ONLY)
BASO#: 0.2 10*3/uL (ref 0.0–0.2)
BASO%: 0.8 % (ref 0.0–2.0)
EOS ABS: 0.2 10*3/uL (ref 0.0–0.5)
EOS%: 0.8 % (ref 0.0–7.0)
HCT: 44.6 % (ref 38.7–49.9)
HGB: 13.2 g/dL (ref 13.0–17.1)
LYMPH#: 1.3 10*3/uL (ref 0.9–3.3)
LYMPH%: 6.2 % — AB (ref 14.0–48.0)
MCH: 19.8 pg — ABNORMAL LOW (ref 28.0–33.4)
MCHC: 29.6 g/dL — AB (ref 32.0–35.9)
MCV: 67 fL — AB (ref 82–98)
MONO#: 0.7 10*3/uL (ref 0.1–0.9)
MONO%: 3.2 % (ref 0.0–13.0)
NEUT%: 89 % — ABNORMAL HIGH (ref 40.0–80.0)
NEUTROS ABS: 19.1 10*3/uL — AB (ref 1.5–6.5)
PLATELETS: 268 10*3/uL (ref 145–400)
RBC: 6.65 10*6/uL — ABNORMAL HIGH (ref 4.20–5.70)
RDW: 22.4 % — AB (ref 11.1–15.7)
WBC: 21.5 10*3/uL — AB (ref 4.0–10.0)

## 2016-05-14 LAB — TECHNOLOGIST REVIEW CHCC SATELLITE

## 2016-05-14 LAB — COMPREHENSIVE METABOLIC PANEL
ALBUMIN: 3.7 g/dL (ref 3.5–5.0)
ALK PHOS: 913 U/L — AB (ref 40–150)
ALT: 41 U/L (ref 0–55)
AST: 39 U/L — AB (ref 5–34)
Anion Gap: 10 mEq/L (ref 3–11)
BILIRUBIN TOTAL: 1 mg/dL (ref 0.20–1.20)
BUN: 14 mg/dL (ref 7.0–26.0)
CO2: 26 mEq/L (ref 22–29)
CREATININE: 1 mg/dL (ref 0.7–1.3)
Calcium: 9.2 mg/dL (ref 8.4–10.4)
Chloride: 105 mEq/L (ref 98–109)
EGFR: 81 mL/min/{1.73_m2} — ABNORMAL LOW (ref >90)
GLUCOSE: 129 mg/dL (ref 70–140)
POTASSIUM: 4.8 meq/L (ref 3.5–5.1)
SODIUM: 141 meq/L (ref 136–145)
TOTAL PROTEIN: 6.6 g/dL (ref 6.4–8.3)

## 2016-05-14 NOTE — Progress Notes (Signed)
Hematology and Oncology Follow Up Visit  [name] [id number] 01-21-1951 65 y.o. 06/20/2014   Principle Diagnosis:  Polycythemia vera -- JAK2 positive. Perforated gastric ulcer-H pylori positive  Current Therapy:   1. Phlebotomy to maintain hematocrit below 45%.      Interim History:  Mr.  Benjamin [name] is back for followup. He looks great. He is feeling good. He has pretty much recovered from his abdominal surgery for the bleeding ulcer. I do have back on baby aspirin. I think this is very important with respect to his polycythemia.  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   He has had no abdominal pain. He's had no change in bowel or bladder habits. He has had no cough or shortness of breath.   He does have rosacea. He is taking some vitamin C. I told him of the vitamin C can increase his body's ability to absorb iron which might increase his blood count. However, it seems like the vitamin C is helping the rosacea nicely.   Overall, his performance status is ECOG is 0.   Medications: Current outpatient prescriptions:aspirin 325 MG tablet, Take 325 mg by mouth daily.  , Disp: , Rfl: ;  cholecalciferol (VITAMIN D) 1000 UNITS tablet, Take 1,000 Units by mouth daily., Disp: , Rfl: ;  Magnesium 400 MG CAPS, Take by mouth every morning., Disp: , Rfl: ;  NON FORMULARY, as needed. Horse Chestnut tab, Disp: , Rfl: ;  NON FORMULARY, Apply topically every morning. MOTOQ-- IT IS A CO-Q 10 CREAM THAT HE USES FOR ROSACEA., Disp: , Rfl:  Omega-3 Fatty Acids (FISH OIL) 1000 MG CAPS, Take by mouth every morning., Disp: , Rfl: ;  sildenafil (VIAGRA) 25 MG tablet, Take 2-5 tablets as needed for sexual activity, Disp: 50 tablet, Rfl: 0;  sulfamethoxazole-trimethoprim (BACTRIM DS) 800-160 MG per tablet, Take 1 tablet by mouth 2 (two) times daily., Disp: , Rfl: ;  Vitamin E 400 UNITS TABS, Take by mouth every morning., Disp:  , Rfl:   Allergies: No Known Allergies  Past Medical History, Surgical history, Social history, and Family History were reviewed and updated.  Review of Systems: As above  Physical Exam:  height is 6\' 2"  (1.88 m) and weight is 177 lb (80.287 kg). His oral temperature is 97.7 F (36.5 C). His blood pressure is 132/70 and his pulse is 75. His respiration is 18.   Well-developed and well-nourished white male.  Head and neck exam shows no ocular or oral lesions. He has no palpable cervical or supra-clavicular lymph nodes. Lungs are clear. Cardiac exam regular rate and rhythm. There are no murmurs, rubs or bruits.  Abdomen is soft. He has a healed laparotomy scar. He has good bowel sounds. There is no fluid wave. There is no palpable liver or spleen tip. Extremities shows no clubbing cyanosis or edema. Has good range motion of his joints. Skin exam no rashes, ecchymosis or petechia. He does have rosacea on his face. Neurological exam is nonfocal.  Lab Results  Component Value Date   WBC 16.2* 06/20/2014   HGB 14.1 06/20/2014   HCT 46.2 06/20/2014   MCV 70* 06/20/2014   PLT 229 06/20/2014     Chemistry      Component Value Date/Time   NA 136 04/04/2014 1520   K 4.4 04/04/2014 1520   CL 103 04/04/2014 1520   CO2 22 04/04/2014 1520  BUN 18 04/04/2014 1520   CREATININE 1.07 04/04/2014 1520      Component Value Date/Time   CALCIUM 8.6 04/04/2014 1520   ALKPHOS 691* 04/04/2014 1520   AST 29 04/04/2014 1520   ALT 28 04/04/2014 1520   BILITOT 1.0 04/04/2014 1520         Impression and Plan: [name] is 65 year old white male with polycythemia vera. We have been following him for over 10 years. He is done incredibly well. He really has had no complications.  I do not think that he needs to be phlebotomized.  l probably will get him back in about 6 weeks now and see how he is doing.  He is back on the baby aspirin.    Volanda Napoleon, MD 10/28/20151:01 PM

## 2016-05-14 NOTE — Progress Notes (Signed)
Hct 44.6. No phlebotomy today per dr. Marin Olp

## 2016-05-15 LAB — FERRITIN: FERRITIN: 13 ng/mL — AB (ref 22–316)

## 2016-05-15 LAB — IRON AND TIBC
%SAT: 5 % — AB (ref 20–55)
IRON: 18 ug/dL — AB (ref 42–163)
TIBC: 349 ug/dL (ref 202–409)
UIBC: 331 ug/dL (ref 117–376)

## 2016-06-25 ENCOUNTER — Other Ambulatory Visit (HOSPITAL_BASED_OUTPATIENT_CLINIC_OR_DEPARTMENT_OTHER): Payer: Medicare Other

## 2016-06-25 ENCOUNTER — Ambulatory Visit (HOSPITAL_BASED_OUTPATIENT_CLINIC_OR_DEPARTMENT_OTHER): Payer: Medicare Other | Admitting: Hematology & Oncology

## 2016-06-25 ENCOUNTER — Encounter: Payer: Self-pay | Admitting: Emergency Medicine

## 2016-06-25 VITALS — BP 146/66 | HR 83 | Temp 97.8°F | Resp 20 | Wt 171.1 lb

## 2016-06-25 DIAGNOSIS — D45 Polycythemia vera: Secondary | ICD-10-CM

## 2016-06-25 LAB — CBC WITH DIFFERENTIAL (CANCER CENTER ONLY)
BASO#: 0.1 10*3/uL (ref 0.0–0.2)
BASO%: 0.6 % (ref 0.0–2.0)
EOS ABS: 0.2 10*3/uL (ref 0.0–0.5)
EOS%: 1 % (ref 0.0–7.0)
HEMATOCRIT: 46.7 % (ref 38.7–49.9)
HEMOGLOBIN: 13.9 g/dL (ref 13.0–17.1)
LYMPH#: 1.7 10*3/uL (ref 0.9–3.3)
LYMPH%: 7.7 % — ABNORMAL LOW (ref 14.0–48.0)
MCH: 20.1 pg — AB (ref 28.0–33.4)
MCHC: 29.8 g/dL — AB (ref 32.0–35.9)
MCV: 68 fL — ABNORMAL LOW (ref 82–98)
MONO#: 0.6 10*3/uL (ref 0.1–0.9)
MONO%: 2.8 % (ref 0.0–13.0)
NEUT#: 19.4 10*3/uL — ABNORMAL HIGH (ref 1.5–6.5)
NEUT%: 87.9 % — AB (ref 40.0–80.0)
Platelets: 301 10*3/uL (ref 145–400)
RBC: 6.9 10*6/uL — ABNORMAL HIGH (ref 4.20–5.70)
RDW: 23.1 % — AB (ref 11.1–15.7)
WBC: 22.1 10*3/uL — ABNORMAL HIGH (ref 4.0–10.0)

## 2016-06-25 LAB — FERRITIN: FERRITIN: 17 ng/mL — AB (ref 22–316)

## 2016-06-25 LAB — CHCC SATELLITE - SMEAR

## 2016-06-25 LAB — IRON AND TIBC
%SAT: 5 % — ABNORMAL LOW (ref 20–55)
IRON: 19 ug/dL — AB (ref 42–163)
TIBC: 366 ug/dL (ref 202–409)
UIBC: 347 ug/dL (ref 117–376)

## 2016-06-25 NOTE — Progress Notes (Signed)
Hematology and Oncology Follow Up Visit  DONATELLO MCGAFFEY [id number] 01/24/1951 65 y.o. 06/20/2014   Principle Diagnosis:  Polycythemia vera -- JAK2 positive. Perforated gastric ulcer-H pylori positive  Current Therapy:   1. Phlebotomy to maintain hematocrit below 45%. 2. EC ASA 81 mg po q day      Interim History:  Mr.  Claxton is back for followup. He looks great. He is feeling good. He has pretty much recovered from his abdominal surgery for the bleeding ulcer. I do have him back on baby aspirin. I think this is very important with respect to his polycythemia.  He and his wife her head up to [place] for the weekend. [place] will be staying in [place]. I'm sure that he will enjoy this.  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 They really had a good time.  He's had no problems with fever. He had a little bit of a cold last week.  He does have rosacea. The rosacea seems to respond nicely to Bactrim. I think he also uses a topical cream.  Overall, his performance status is ECOG is 0.   Medications: Current outpatient prescriptions:aspirin 325 MG tablet, Take 325 mg by mouth daily.  , Disp: , Rfl: ;  cholecalciferol (VITAMIN D) 1000 UNITS tablet, Take 1,000 Units by mouth daily., Disp: , Rfl: ;  Magnesium 400 MG CAPS, Take by mouth every morning., Disp: , Rfl: ;  NON FORMULARY, as needed. Horse Chestnut tab, Disp: , Rfl: ;  NON FORMULARY, Apply topically every morning. MOTOQ-- IT IS A CO-Q 10 CREAM THAT HE USES FOR ROSACEA., Disp: , Rfl:  Omega-3 Fatty Acids (FISH OIL) 1000 MG CAPS, Take by mouth every morning., Disp: , Rfl: ;  sildenafil (VIAGRA) 25 MG tablet, Take 2-5 tablets as needed for sexual activity, Disp: 50 tablet, Rfl: 0;  sulfamethoxazole-trimethoprim (BACTRIM DS) 800-160 MG per tablet, Take 1 tablet by mouth 2 (two) times daily., Disp: , Rfl: ;  Vitamin E 400 UNITS TABS, Take by mouth every morning., Disp: , Rfl:   Allergies: No Known Allergies  Past  Medical History, Surgical history, Social history, and Family History were reviewed and updated.  Review of Systems: As above  Physical Exam:  height is 6\' 2"  (1.88 m) and weight is 177 lb (80.287 kg). His oral temperature is 97.7 F (36.5 C). His blood pressure is 132/70 and his pulse is 75. His respiration is 18.   Well-developed and well-nourished white male.  Head and neck exam shows no ocular or oral lesions. He has no palpable cervical or supra-clavicular lymph nodes. Lungs are clear. Cardiac exam regular rate and rhythm. There are no murmurs, rubs or bruits.  Abdomen is soft. He has a healed laparotomy scar. He has good bowel sounds. There is no fluid wave. There is no palpable liver or spleen tip. Extremities shows no clubbing cyanosis or edema. Has good range motion of his joints. Skin exam no rashes, ecchymosis or petechia. He does have rosacea on his face. Neurological exam is nonfocal.  Lab Results  Component Value Date   WBC 16.2* 06/20/2014   HGB 14.1 06/20/2014   HCT 46.2 06/20/2014   MCV 70* 06/20/2014   PLT 229 06/20/2014     Chemistry      Component Value Date/Time   NA 136 04/04/2014 1520   K 4.4 04/04/2014 1520   CL 103 04/04/2014 1520   CO2 22 04/04/2014 1520   BUN 18 04/04/2014 1520  CREATININE 1.07 04/04/2014 1520      Component Value Date/Time   CALCIUM 8.6 04/04/2014 1520   ALKPHOS 691* 04/04/2014 1520   AST 29 04/04/2014 1520   ALT 28 04/04/2014 1520   BILITOT 1.0 04/04/2014 1520         Impression and Plan: Mr. Boroughs is 65 year old white male with polycythemia vera. We have been following him for over 10 years. He is done incredibly well. He really has had no complications.  We will go ahead and phlebotomize him next week. I think after that, we can probably get him back after the holidays.  I'm just glad that he is recovered so well from his abdominal surgery back in April.     Volanda Napoleon, MD 10/28/20151:01 PM

## 2016-06-26 LAB — RETICULOCYTES: Reticulocyte Count: 2.6 % (ref 0.6–2.6)

## 2016-06-29 ENCOUNTER — Ambulatory Visit (HOSPITAL_BASED_OUTPATIENT_CLINIC_OR_DEPARTMENT_OTHER): Payer: Medicare Other

## 2016-06-29 VITALS — BP 144/54 | HR 79 | Temp 97.8°F | Resp 18

## 2016-06-29 DIAGNOSIS — D45 Polycythemia vera: Secondary | ICD-10-CM

## 2016-06-29 NOTE — Patient Instructions (Signed)
Therapeutic Phlebotomy, Care After  Refer to this sheet in the next few weeks. These instructions provide you with information about caring for yourself after your procedure. Your health care provider may also give you more specific instructions. Your treatment has been planned according to current medical practices, but problems sometimes occur. Call your health care provider if you have any problems or questions after your procedure.  WHAT TO EXPECT AFTER THE PROCEDURE  After your procedure, it is common to have:   Light-headedness or dizziness. You may feel faint.   Nausea.   Tiredness.  HOME CARE INSTRUCTIONS  Activities   Return to your normal activities as directed by your health care provider. Most people can go back to their normal activities right away.   Avoid strenuous physical activity and heavy lifting or pulling for about 5 hours after the procedure. Do not lift anything that is heavier than 10 lb (4.5 kg).   Athletes should avoid strenuous exercise for at least 12 hours.   Change positions slowly for the remainder of the day. This will help to prevent light-headedness or fainting.   If you feel light-headed, lie down until the feeling goes away.  Eating and Drinking   Be sure to eat well-balanced meals for the next 24 hours.   Drink enough fluid to keep your urine clear or pale yellow.   Avoid drinking alcohol on the day that you had the procedure.  Care of the Needle Insertion Site   Keep your bandage dry. You can remove the bandage after about 5 hours or as directed by your health care provider.   If you have bleeding from the needle insertion site, elevate your arm and press firmly on the site until the bleeding stops.   If you have bruising at the site, apply ice to the area:   Put ice in a plastic bag.   Place a towel between your skin and the bag.   Leave the ice on for 20 minutes, 2-3 times a day for the first 24 hours.   If the swelling does not go away after 24 hours, apply  a warm, moist washcloth to the area for 20 minutes, 2-3 times a day.  General Instructions   Avoid smoking for at least 30 minutes after the procedure.   Keep all follow-up visits as directed by your health care provider. It is important to continue with further therapeutic phlebotomy treatments as directed.  SEEK MEDICAL CARE IF:   You have redness, swelling, or pain at the needle insertion site.   You have fluid, blood, or pus coming from the needle insertion site.   You feel light-headed, dizzy, or nauseated, and the feeling does not go away.   You notice new bruising at the needle insertion site.   You feel weaker than normal.   You have a fever or chills.  SEEK IMMEDIATE MEDICAL CARE IF:   You have severe nausea or vomiting.   You have chest pain.   You have trouble breathing.    This information is not intended to replace advice given to you by your health care provider. Make sure you discuss any questions you have with your health care provider.    Document Released: 01/12/2011 Document Revised: 12/25/2014 Document Reviewed: 08/06/2014  Elsevier Interactive Patient Education 2016 [employer]

## 2016-06-29 NOTE — Progress Notes (Signed)
Benjamin [name] presents today for phlebotomy per MD orders. Phlebotomy procedure started at 1435 and ended at 1440. 500 grams removed. Patient observed for 30 minutes after procedure without any incident. Patient tolerated procedure well. IV needle removed intact.

## 2016-08-27 ENCOUNTER — Ambulatory Visit (HOSPITAL_BASED_OUTPATIENT_CLINIC_OR_DEPARTMENT_OTHER): Payer: Medicare Other | Admitting: Hematology & Oncology

## 2016-08-27 ENCOUNTER — Other Ambulatory Visit (HOSPITAL_BASED_OUTPATIENT_CLINIC_OR_DEPARTMENT_OTHER): Payer: Medicare Other

## 2016-08-27 VITALS — BP 146/84 | HR 88 | Temp 97.8°F | Wt 170.4 lb

## 2016-08-27 DIAGNOSIS — D45 Polycythemia vera: Secondary | ICD-10-CM

## 2016-08-27 LAB — CBC WITH DIFFERENTIAL (CANCER CENTER ONLY)
BASO#: 0.2 10*3/uL (ref 0.0–0.2)
BASO%: 0.9 % (ref 0.0–2.0)
EOS%: 0.9 % (ref 0.0–7.0)
Eosinophils Absolute: 0.2 10*3/uL (ref 0.0–0.5)
HEMATOCRIT: 46.5 % (ref 38.7–49.9)
HEMOGLOBIN: 13.9 g/dL (ref 13.0–17.1)
LYMPH#: 1.4 10*3/uL (ref 0.9–3.3)
LYMPH%: 7.2 % — ABNORMAL LOW (ref 14.0–48.0)
MCH: 19.8 pg — ABNORMAL LOW (ref 28.0–33.4)
MCHC: 29.9 g/dL — ABNORMAL LOW (ref 32.0–35.9)
MCV: 66 fL — AB (ref 82–98)
MONO#: 0.6 10*3/uL (ref 0.1–0.9)
MONO%: 3.2 % (ref 0.0–13.0)
NEUT%: 87.8 % — ABNORMAL HIGH (ref 40.0–80.0)
NEUTROS ABS: 17.6 10*3/uL — AB (ref 1.5–6.5)
RBC: 7.01 10*6/uL — AB (ref 4.20–5.70)
RDW: 22.4 % — ABNORMAL HIGH (ref 11.1–15.7)
WBC: 20 10*3/uL — AB (ref 4.0–10.0)

## 2016-08-27 LAB — CHCC SATELLITE - SMEAR

## 2016-08-27 NOTE — Progress Notes (Signed)
Hematology and Oncology Follow Up Visit  Benjamin [name] [id number] [birth year] 66 y.o. 06/20/2014   Principle Diagnosis:  Polycythemia vera -- JAK2 positive. Perforated gastric ulcer-H pylori positive  Current Therapy:   1. Phlebotomy to maintain hematocrit below 45%. 2. EC ASA 81 mg po q day      Interim History:  Benjamin [name] is back for followup. He looks great. He is feeling good. He had a wonderful holiday season. He was quite busy as always. 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   He has had no problems otherwise. He had a physical exam from his family doctor, Dr. Reynaldo [name]. Everything turned out well.   He and his wife will be going to [place] the end of February. They will will be going for vacation.   He's had no problems with fever. He's had no problems with cough or shortness of breath. There's been no change in bowel or bladder habits. He's had no rashes. He does have rosacea which is fairly stable.   Overall, his performance status is ECOG is 0.   Medications: Current outpatient prescriptions:aspirin 325 MG tablet, Take 325 mg by mouth daily.  , Disp: , Rfl: ;  cholecalciferol (VITAMIN D) 1000 UNITS tablet, Take 1,000 Units by mouth daily., Disp: , Rfl: ;  Magnesium 400 MG CAPS, Take by mouth every morning., Disp: , Rfl: ;  NON FORMULARY, as needed. Horse Chestnut tab, Disp: , Rfl: ;  NON FORMULARY, Apply topically every morning. MOTOQ-- IT IS A CO-Q 10 CREAM THAT HE USES FOR ROSACEA., Disp: , Rfl:  Omega-3 Fatty Acids (FISH OIL) 1000 MG CAPS, Take by mouth every morning., Disp: , Rfl: ;  sildenafil (VIAGRA) 25 MG tablet, Take 2-5 tablets as needed for sexual activity, Disp: 50 tablet, Rfl: 0;  sulfamethoxazole-trimethoprim (BACTRIM DS) 800-160 MG per tablet, Take 1 tablet by mouth 2 (two) times daily., Disp: , Rfl: ;  Vitamin E 400 UNITS TABS, Take by mouth every morning., Disp: , Rfl:   Allergies: No Known Allergies  Past  Medical History, Surgical history, Social history, and Family History were reviewed and updated.  Review of Systems: As above  Physical Exam:  height is 6\' 2"  (1.88 m) and weight is 177 lb (80.287 kg). His oral temperature is 97.7 F (36.5 C). His blood pressure is 132/70 and his pulse is 75. His respiration is 18.   Well-developed and well-nourished white male.  Head and neck exam shows no ocular or oral lesions. He has no palpable cervical or supra-clavicular lymph nodes. Lungs are clear. Cardiac exam regular rate and rhythm. There are no murmurs, rubs or bruits.  Abdomen is soft. He has a healed laparotomy scar. He has good bowel sounds. There is no fluid wave. There is no palpable liver or spleen tip. Extremities shows no clubbing cyanosis or edema. Has good range motion of his joints. Skin exam no rashes, ecchymosis or petechia. He does have rosacea on his face. Neurological exam is nonfocal.  Lab Results  Component Value Date   WBC 16.2* 06/20/2014   HGB 14.1 06/20/2014   HCT 46.2 06/20/2014   MCV 70* 06/20/2014   PLT 229 06/20/2014     Chemistry      Component Value Date/Time   NA 136 04/04/2014 1520   K 4.4 04/04/2014 1520   CL 103 04/04/2014 1520   CO2 22 04/04/2014 1520   BUN 18 04/04/2014 1520   CREATININE 1.07  04/04/2014 1520      Component Value Date/Time   CALCIUM 8.6 04/04/2014 1520   ALKPHOS 691* 04/04/2014 1520   AST 29 04/04/2014 1520   ALT 28 04/04/2014 1520   BILITOT 1.0 04/04/2014 1520         Impression and Plan: Benjamin [name] is 66 year old white male with polycythemia vera. We have been following him for over 10 years. He is done incredibly well. He really has had no complications.  We will go ahead and phlebotomize him next week.  We will plan to get him back in 2 months. At that point in time, he will come back from [place]. He and his wife are going there in late February for vacation.    Volanda Napoleon, MD 10/28/20151:01 PM

## 2016-08-28 LAB — IRON AND TIBC
%SAT: 6 % — ABNORMAL LOW (ref 20–55)
Iron: 26 ug/dL — ABNORMAL LOW (ref 42–163)
TIBC: 403 ug/dL (ref 202–409)
UIBC: 377 ug/dL — AB (ref 117–376)

## 2016-08-28 LAB — FERRITIN: FERRITIN: 16 ng/mL — AB (ref 22–316)

## 2016-08-31 ENCOUNTER — Ambulatory Visit (HOSPITAL_BASED_OUTPATIENT_CLINIC_OR_DEPARTMENT_OTHER): Payer: Medicare Other

## 2016-08-31 DIAGNOSIS — D45 Polycythemia vera: Secondary | ICD-10-CM

## 2016-10-28 ENCOUNTER — Ambulatory Visit (HOSPITAL_BASED_OUTPATIENT_CLINIC_OR_DEPARTMENT_OTHER): Payer: Medicare Other | Admitting: Family

## 2016-10-28 ENCOUNTER — Other Ambulatory Visit (HOSPITAL_BASED_OUTPATIENT_CLINIC_OR_DEPARTMENT_OTHER): Payer: Medicare Other

## 2016-10-28 VITALS — BP 130/70 | HR 72 | Temp 97.5°F | Resp 20 | Wt 174.1 lb

## 2016-10-28 DIAGNOSIS — D72829 Elevated white blood cell count, unspecified: Secondary | ICD-10-CM

## 2016-10-28 DIAGNOSIS — D45 Polycythemia vera: Secondary | ICD-10-CM | POA: Diagnosis not present

## 2016-10-28 LAB — CMP (CANCER CENTER ONLY)
ALBUMIN: 3.7 g/dL (ref 3.3–5.5)
ALT(SGPT): 36 U/L (ref 10–47)
AST: 35 U/L (ref 11–38)
Alkaline Phosphatase: 864 U/L — ABNORMAL HIGH (ref 26–84)
BUN, Bld: 14 mg/dL (ref 7–22)
CALCIUM: 9.4 mg/dL (ref 8.0–10.3)
CHLORIDE: 103 meq/L (ref 98–108)
CO2: 29 meq/L (ref 18–33)
Creat: 1.3 mg/dl — ABNORMAL HIGH (ref 0.6–1.2)
Glucose, Bld: 90 mg/dL (ref 73–118)
POTASSIUM: 4.5 meq/L (ref 3.3–4.7)
Sodium: 140 mEq/L (ref 128–145)
Total Bilirubin: 1 mg/dl (ref 0.20–1.60)
Total Protein: 6.4 g/dL (ref 6.4–8.1)

## 2016-10-28 LAB — CBC WITH DIFFERENTIAL (CANCER CENTER ONLY)
BASO#: 0.1 10*3/uL (ref 0.0–0.2)
BASO%: 0.5 % (ref 0.0–2.0)
EOS%: 1 % (ref 0.0–7.0)
Eosinophils Absolute: 0.2 10*3/uL (ref 0.0–0.5)
HCT: 44.2 % (ref 38.7–49.9)
HGB: 13.1 g/dL (ref 13.0–17.1)
LYMPH#: 1.5 10*3/uL (ref 0.9–3.3)
LYMPH%: 6.6 % — AB (ref 14.0–48.0)
MCH: 20.2 pg — ABNORMAL LOW (ref 28.0–33.4)
MCHC: 29.6 g/dL — ABNORMAL LOW (ref 32.0–35.9)
MCV: 68 fL — ABNORMAL LOW (ref 82–98)
MONO#: 0.8 10*3/uL (ref 0.1–0.9)
MONO%: 3.4 % (ref 0.0–13.0)
NEUT#: 20.6 10*3/uL — ABNORMAL HIGH (ref 1.5–6.5)
NEUT%: 88.5 % — ABNORMAL HIGH (ref 40.0–80.0)
Platelets: 291 10*3/uL (ref 145–400)
RBC: 6.48 10*6/uL — AB (ref 4.20–5.70)
RDW: 22.5 % — AB (ref 11.1–15.7)
WBC: 23.3 10*3/uL — AB (ref 4.0–10.0)

## 2016-10-28 LAB — TECHNOLOGIST REVIEW CHCC SATELLITE

## 2016-10-28 NOTE — Progress Notes (Signed)
Hematology and Oncology Follow Up Visit  Benjamin [name] [id number] 1951-04-10 66 y.o. 10/28/2016   Principle Diagnosis:  Polycythemia vera - JAK2 positive. Perforated gastric ulcer - H pylori positive  Current Therapy:   1. Phlebotomy to maintain hematocrit below 45% - ;ast phlebotomy was in January 2018 2. EC ASA 81 mg po q day    Interim History:  Benjamin [name] is here today for follow-up. He is doing well and has no complaints at this time. Hct is 44.2 so he will need a phlebotomy this visit.  He verbalized that he is taking his baby aspirin daily.  He states that if he takes a hot shower, when he gets out he itches for a little while. This has been an ongoing issue for him for years and is tolerable.  He has had no issue with infections. No fever, chills, n/v, cough, rash, dizziness, headaches, vision changes, SOB, chest pain, palpitations, abdominal pain or changes in bowel or bladder habits.  No swelling, tenderness, numbness or tingling in her extremities. No c/o aches or pains.  He has maintained a good appetite and is staying well hydrated. Her weight is stable.  He and his wife just returned from a wonderful trip to [place].   Medications:  Allergies as of 10/28/2016      Reactions   Keflex [cephalexin] Hives   Biaxin [clarithromycin]    Joint pains   Flagyl [metronidazole] Other (See Comments)   Joint pain   Morphine And Related Hives      Medication List       Accurate as of 10/28/16  2:26 PM. Always use your most recent med list.          aspirin 325 MG tablet Take 325 mg by mouth every morning.   FINACEA 15 % Foam Generic drug:  Azelaic Acid   Fish Oil 1000 MG Caps Take 1 capsule by mouth daily.   Magnesium 400 MG Caps Take 1 capsule by mouth daily.   sulfamethoxazole-trimethoprim 800-160 MG tablet Commonly known as:  BACTRIM DS,SEPTRA DS Take by mouth. Takes as needed for acne.   vitamin C 1000 MG tablet Take 1,000 mg by mouth daily.     Vitamin D 2000 units Caps Take by mouth.   Vitamin E 400 units Tabs Take 1 tablet by mouth daily.       Allergies:  Allergies  Allergen Reactions  . Keflex [Cephalexin] Hives  . Biaxin [Clarithromycin]     Joint pains  . Flagyl [Metronidazole] Other (See Comments)    Joint pain  . Morphine And Related Hives    Past Medical History, Surgical history, Social history, and Family History were reviewed and updated.  Review of Systems: All other 10 point review of systems is negative.   Physical Exam:  vitals were not taken for this visit.  Wt Readings from Last 3 Encounters:  08/27/16 170 lb 6.4 oz (77.3 kg)  06/25/16 171 lb 1.9 oz (77.6 kg)  05/14/16 169 lb 6.4 oz (76.8 kg)    Ocular: Sclerae unicteric, pupils equal, round and reactive to light Ear-nose-throat: Oropharynx clear, dentition fair Lymphatic: No cervical supraclavicular or axillary adenopathy Lungs no rales or rhonchi, good excursion bilaterally Heart regular rate and rhythm, no murmur appreciated Abd soft, nontender, positive bowel sounds, no liver or spleen tip palpated on exam, no fluid wave MSK no focal spinal tenderness, no joint edema Neuro: non-focal, well-oriented, appropriate affect Breasts: Deferred  Lab Results  Component Value Date  WBC 20.0 (H) 08/27/2016   HGB 13.9 08/27/2016   HCT 46.5 08/27/2016   MCV 66 (L) 08/27/2016   PLT 309 Large platelets present 08/27/2016   Lab Results  Component Value Date   FERRITIN 16 (L) 08/27/2016   IRON 26 (L) 08/27/2016   TIBC 403 08/27/2016   UIBC 377 (H) 08/27/2016   IRONPCTSAT 6 (L) 08/27/2016   Lab Results  Component Value Date   RETICCTPCT 2.7 (H) 02/27/2015   RBC 7.01 (H) 08/27/2016   RETICCTABS 173.6 02/27/2015   No results found for: KPAFRELGTCHN, LAMBDASER, KAPLAMBRATIO No results found for: IGGSERUM, IGA, IGMSERUM No results found for: Ronnald Ramp, A1GS, A2GS, Violet Baldy, MSPIKE, SPEI   Chemistry       Component Value Date/Time   NA 141 05/14/2016 1202   K 4.8 05/14/2016 1202   CL 103 01/02/2016 0720   CL 100 12/19/2014 1417   CO2 26 05/14/2016 1202   BUN 14.0 05/14/2016 1202   CREATININE 1.0 05/14/2016 1202      Component Value Date/Time   CALCIUM 9.2 05/14/2016 1202   ALKPHOS 913 (H) 05/14/2016 1202   AST 39 (H) 05/14/2016 1202   ALT 41 05/14/2016 1202   BILITOT 1.00 05/14/2016 1202     Impression and Plan: Benjamin [name] is 66 yo white male with polycythemia vera. He has responded nicely to phlebotomy and has had no complications. He has no complaints at this time.  His Hct is stable at 44.2 so he will not need a phlebotomy today.  We will plan to see him back in 2 months for repeat lab work and follow-up.  He will contact our office with any questions or concerns. We can certainly see him sooner if need be.   Eliezer Bottom, NP 3/7/20182:26 PM

## 2016-10-29 LAB — IRON AND TIBC
%SAT: 5 % — ABNORMAL LOW (ref 20–55)
Iron: 20 ug/dL — ABNORMAL LOW (ref 42–163)
TIBC: 388 ug/dL (ref 202–409)
UIBC: 367 ug/dL (ref 117–376)

## 2016-10-29 LAB — FERRITIN: Ferritin: 16 ng/ml — ABNORMAL LOW (ref 22–316)

## 2016-12-23 ENCOUNTER — Ambulatory Visit (HOSPITAL_BASED_OUTPATIENT_CLINIC_OR_DEPARTMENT_OTHER): Payer: Medicare Other

## 2016-12-23 ENCOUNTER — Ambulatory Visit (HOSPITAL_BASED_OUTPATIENT_CLINIC_OR_DEPARTMENT_OTHER): Payer: Medicare Other | Admitting: Family

## 2016-12-23 ENCOUNTER — Other Ambulatory Visit (HOSPITAL_BASED_OUTPATIENT_CLINIC_OR_DEPARTMENT_OTHER): Payer: Medicare Other

## 2016-12-23 VITALS — BP 139/59 | HR 82 | Temp 98.4°F | Resp 17 | Wt 177.8 lb

## 2016-12-23 DIAGNOSIS — D45 Polycythemia vera: Secondary | ICD-10-CM | POA: Diagnosis not present

## 2016-12-23 DIAGNOSIS — D72829 Elevated white blood cell count, unspecified: Secondary | ICD-10-CM

## 2016-12-23 DIAGNOSIS — D500 Iron deficiency anemia secondary to blood loss (chronic): Secondary | ICD-10-CM

## 2016-12-23 LAB — CMP (CANCER CENTER ONLY)
ALBUMIN: 3.7 g/dL (ref 3.3–5.5)
ALK PHOS: 875 U/L — AB (ref 26–84)
ALT: 47 U/L (ref 10–47)
AST: 38 U/L (ref 11–38)
BUN: 15 mg/dL (ref 7–22)
CALCIUM: 9.1 mg/dL (ref 8.0–10.3)
CO2: 29 mEq/L (ref 18–33)
Chloride: 105 mEq/L (ref 98–108)
Creat: 1 mg/dl (ref 0.6–1.2)
Glucose, Bld: 132 mg/dL — ABNORMAL HIGH (ref 73–118)
POTASSIUM: 4.4 meq/L (ref 3.3–4.7)
Sodium: 140 mEq/L (ref 128–145)
TOTAL PROTEIN: 6.4 g/dL (ref 6.4–8.1)
Total Bilirubin: 1.1 mg/dl (ref 0.20–1.60)

## 2016-12-23 LAB — CBC WITH DIFFERENTIAL (CANCER CENTER ONLY)
BASO#: 0.1 10*3/uL (ref 0.0–0.2)
BASO%: 0.6 % (ref 0.0–2.0)
EOS ABS: 0.2 10*3/uL (ref 0.0–0.5)
EOS%: 0.9 % (ref 0.0–7.0)
HEMATOCRIT: 46.1 % (ref 38.7–49.9)
HEMOGLOBIN: 13.6 g/dL (ref 13.0–17.1)
LYMPH#: 1.3 10*3/uL (ref 0.9–3.3)
LYMPH%: 5.8 % — ABNORMAL LOW (ref 14.0–48.0)
MCH: 20.4 pg — ABNORMAL LOW (ref 28.0–33.4)
MCHC: 29.5 g/dL — ABNORMAL LOW (ref 32.0–35.9)
MCV: 69 fL — AB (ref 82–98)
MONO#: 0.5 10*3/uL (ref 0.1–0.9)
MONO%: 2.1 % (ref 0.0–13.0)
NEUT#: 19.4 10*3/uL — ABNORMAL HIGH (ref 1.5–6.5)
NEUT%: 90.6 % — ABNORMAL HIGH (ref 40.0–80.0)
Platelets: 283 10*3/uL (ref 145–400)
RBC: 6.67 10*6/uL — AB (ref 4.20–5.70)
RDW: 22.7 % — ABNORMAL HIGH (ref 11.1–15.7)
WBC: 21.4 10*3/uL — AB (ref 4.0–10.0)

## 2016-12-23 MED ORDER — LIDOCAINE-PRILOCAINE 2.5-2.5 % EX CREA
TOPICAL_CREAM | CUTANEOUS | Status: AC
  Filled 2016-12-23: qty 30

## 2016-12-23 NOTE — Patient Instructions (Signed)
Therapeutic Phlebotomy, Care After  Refer to this sheet in the next few weeks. These instructions provide you with information about caring for yourself after your procedure. Your health care provider may also give you more specific instructions. Your treatment has been planned according to current medical practices, but problems sometimes occur. Call your health care provider if you have any problems or questions after your procedure.  WHAT TO EXPECT AFTER THE PROCEDURE  After your procedure, it is common to have:   Light-headedness or dizziness. You may feel faint.   Nausea.   Tiredness.  HOME CARE INSTRUCTIONS  Activities   Return to your normal activities as directed by your health care provider. Most people can go back to their normal activities right away.   Avoid strenuous physical activity and heavy lifting or pulling for about 5 hours after the procedure. Do not lift anything that is heavier than 10 lb (4.5 kg).   Athletes should avoid strenuous exercise for at least 12 hours.   Change positions slowly for the remainder of the day. This will help to prevent light-headedness or fainting.   If you feel light-headed, lie down until the feeling goes away.  Eating and Drinking   Be sure to eat well-balanced meals for the next 24 hours.   Drink enough fluid to keep your urine clear or pale yellow.   Avoid drinking alcohol on the day that you had the procedure.  Care of the Needle Insertion Site   Keep your bandage dry. You can remove the bandage after about 5 hours or as directed by your health care provider.   If you have bleeding from the needle insertion site, elevate your arm and press firmly on the site until the bleeding stops.   If you have bruising at the site, apply ice to the area:   Put ice in a plastic bag.   Place a towel between your skin and the bag.   Leave the ice on for 20 minutes, 2-3 times a day for the first 24 hours.   If the swelling does not go away after 24 hours, apply  a warm, moist washcloth to the area for 20 minutes, 2-3 times a day.  General Instructions   Avoid smoking for at least 30 minutes after the procedure.   Keep all follow-up visits as directed by your health care provider. It is important to continue with further therapeutic phlebotomy treatments as directed.  SEEK MEDICAL CARE IF:   You have redness, swelling, or pain at the needle insertion site.   You have fluid, blood, or pus coming from the needle insertion site.   You feel light-headed, dizzy, or nauseated, and the feeling does not go away.   You notice new bruising at the needle insertion site.   You feel weaker than normal.   You have a fever or chills.  SEEK IMMEDIATE MEDICAL CARE IF:   You have severe nausea or vomiting.   You have chest pain.   You have trouble breathing.    This information is not intended to replace advice given to you by your health care provider. Make sure you discuss any questions you have with your health care provider.    Document Released: 01/12/2011 Document Revised: 12/25/2014 Document Reviewed: 08/06/2014  Elsevier Interactive Patient Education 2016 [employer]

## 2016-12-23 NOTE — Progress Notes (Signed)
Hematology and Oncology Follow Up Visit  Benjamin [name] [id number] 11-17-1950 66 y.o. 12/23/2016   Principle Diagnosis:  Polycythemia vera -- JAK2 positive. Perforated gastric ulcer-H pylori positive   Current Therapy:   Phlebotomy to maintained Hct < 45% EC aspirin 81 mg PO daily   Interim History:  [name] is here today for follow-up. He is doing well but has noticed that he is itching more after a hot shower and feels that it may be time for a phlebotomy. His Hct today is 46.1.  He verbalized that he is taking his aspirin daily as prescribed.  No fever, chills, n/v, cough, rash, dizziness, SOB, chest pain, palpitations, abdominal pain or changes in bowel or bladder habits.  No swelling, tenderness, numbness or tingling in his extremities. No c/o pain at this time.  He has maintained a good appetite and is staying well hydrated. His weight is stable.  He is staying busy enjoying the beautiful spring weather we are having.   ECOG Performance Status: 0 - Asymptomatic  Medications:  Allergies as of 12/23/2016      Reactions   Keflex [cephalexin] Hives   Biaxin [clarithromycin]    Joint pains   Flagyl [metronidazole] Other (See Comments)   Joint pain   Morphine And Related Hives      Medication List       Accurate as of 12/23/16  3:40 PM. Always use your most recent med list.          aspirin 81 MG tablet Take 81 mg by mouth every morning.   B-complex with vitamin C tablet Take 1 tablet by mouth daily.   FINACEA 15 % Foam Generic drug:  Azelaic Acid   Fish Oil 1000 MG Caps Take 2 capsules by mouth daily.   Magnesium 400 MG Caps Take 1 capsule by mouth daily.   sulfamethoxazole-trimethoprim 800-160 MG tablet Commonly known as:  BACTRIM DS,SEPTRA DS Take by mouth daily. Takes as needed for acne.   Vitamin D 2000 units Caps Take by mouth.   Vitamin E 400 units Tabs Take 1 tablet by mouth daily.       Allergies:  Allergies  Allergen Reactions  .  Keflex [Cephalexin] Hives  . Biaxin [Clarithromycin]     Joint pains  . Flagyl [Metronidazole] Other (See Comments)    Joint pain  . Morphine And Related Hives    Past Medical History, Surgical history, Social history, and Family History were reviewed and updated.  Review of Systems: All other 10 point review of systems is negative.   Physical Exam:  weight is 177 lb 12.8 oz (80.6 kg). His oral temperature is 98.4 F (36.9 C). His blood pressure is 139/59 (abnormal) and his pulse is 82. His respiration is 17 and oxygen saturation is 99%.   Wt Readings from Last 3 Encounters:  12/23/16 177 lb 12.8 oz (80.6 kg)  10/28/16 174 lb 1.9 oz (79 kg)  08/27/16 170 lb 6.4 oz (77.3 kg)    Ocular: Sclerae unicteric, pupils equal, round and reactive to light Ear-nose-throat: Oropharynx clear, dentition fair Lymphatic: No cervical, supraclavicular or axillary adenopathy Lungs no rales or rhonchi, good excursion bilaterally Heart regular rate and rhythm, no murmur appreciated Abd soft, nontender, positive bowel sounds, no liver or spleen tip palpated on exam, no fluid wave  MSK no focal spinal tenderness, no joint edema Neuro: non-focal, well-oriented, appropriate affect Breasts: Deferred  Lab Results  Component Value Date   WBC 21.4 (H) 12/23/2016  HGB 13.6 12/23/2016   HCT 46.1 12/23/2016   MCV 69 (L) 12/23/2016   PLT 283 Large platelets present 12/23/2016   Lab Results  Component Value Date   FERRITIN 16 (L) 10/28/2016   IRON 20 (L) 10/28/2016   TIBC 388 10/28/2016   UIBC 367 10/28/2016   IRONPCTSAT 5 (L) 10/28/2016   Lab Results  Component Value Date   RETICCTPCT 2.7 (H) 02/27/2015   RBC 6.67 (H) 12/23/2016   RETICCTABS 173.6 02/27/2015   No results found for: KPAFRELGTCHN, LAMBDASER, KAPLAMBRATIO No results found for: IGGSERUM, IGA, IGMSERUM No results found for: Odetta Pink, SPEI   Chemistry      Component Value  Date/Time   NA 140 12/23/2016 1406   NA 141 05/14/2016 1202   K 4.4 12/23/2016 1406   K 4.8 05/14/2016 1202   CL 105 12/23/2016 1406   CO2 29 12/23/2016 1406   CO2 26 05/14/2016 1202   BUN 15 12/23/2016 1406   BUN 14.0 05/14/2016 1202   CREATININE 1.0 12/23/2016 1406   CREATININE 1.0 05/14/2016 1202      Component Value Date/Time   CALCIUM 9.1 12/23/2016 1406   CALCIUM 9.2 05/14/2016 1202   ALKPHOS 875 (H) 12/23/2016 1406   ALKPHOS 913 (H) 05/14/2016 1202   AST 38 12/23/2016 1406   AST 39 (H) 05/14/2016 1202   ALT 47 12/23/2016 1406   ALT 41 05/14/2016 1202   BILITOT 1.10 12/23/2016 1406   BILITOT 1.00 05/14/2016 1202      Impression and Plan: [name] is a very pleasant 66 yo caucasian male with polycythemia vera. He is symptomatic at this time with itching after a hot shower. His Hct is 46.1 so we will proceed with phlebotomy today as planned. He states that he does not need replacement fluids.  We will plan to see him back in 2 months for repeat lab work and follow-up.  He will contact our office with any questions or concerns. We can certainly see him sooner if need be.   Eliezer Bottom, NP 5/2/20183:40 PM

## 2016-12-24 LAB — IRON AND TIBC
%SAT: 5 % — ABNORMAL LOW (ref 20–55)
Iron: 20 ug/dL — ABNORMAL LOW (ref 42–163)
TIBC: 394 ug/dL (ref 202–409)
UIBC: 374 ug/dL (ref 117–376)

## 2016-12-24 LAB — FERRITIN: FERRITIN: 14 ng/mL — AB (ref 22–316)

## 2016-12-31 ENCOUNTER — Encounter: Payer: Self-pay | Admitting: Family Medicine

## 2016-12-31 ENCOUNTER — Ambulatory Visit (INDEPENDENT_AMBULATORY_CARE_PROVIDER_SITE_OTHER): Payer: Medicare Other | Admitting: Family Medicine

## 2016-12-31 VITALS — BP 133/65 | HR 95 | Temp 98.4°F | Resp 16 | Ht 72.0 in | Wt 167.8 lb

## 2016-12-31 DIAGNOSIS — J069 Acute upper respiratory infection, unspecified: Secondary | ICD-10-CM

## 2016-12-31 MED ORDER — BENZONATATE 100 MG PO CAPS: 100.0000 mg | ORAL_CAPSULE | Freq: Two times a day (BID) | ORAL | 0 refills | Status: DC | PRN

## 2016-12-31 MED ORDER — AMOXICILLIN 875 MG PO TABS: 875.0000 mg | ORAL_TABLET | Freq: Two times a day (BID) | ORAL | 0 refills | Status: DC

## 2016-12-31 NOTE — Patient Instructions (Addendum)
I have sent in the antibiotic for you.    I have also sent in the Tessalon perles for cough.    Let me know if this isn't helping in the next week.     IF you received an x-ray today, you will receive an invoice from [facility]. Please contact [facility] at [phone] with questions or concerns regarding your invoice.   IF you received labwork today, you will receive an invoice from [facility]. Please contact [facility] at [phone] with questions or concerns regarding your invoice.   Our billing staff will not be able to assist you with questions regarding bills from these companies.  You will be contacted with the lab results as soon as they are available. The fastest way to get your results is to activate your My Chart account. Instructions are located on the last page of this paperwork. If you have not heard from [place] regarding the results in 2 weeks, please contact this office.

## 2016-12-31 NOTE — Progress Notes (Signed)
   SUBJECTIVE: URI symptoms:  Benjamin [name] is a 66 y.o. male who complains of URI symptoms present for past week.  Describes rhinorrhea, sinus congestion, sore throat, increasing cough.  Cough is preventing him from sleep at night.  Has tried OTC cough meds without relief.  Sick contacts are none that he knows of.  No fevers or chills. No nausea or vomiting.  Denies smoking cigarettes.  ROS as above.    PMH reviewed. Patient is a nonsmoker.  Does have history of polycythemia vera Medications reviewed.  Physical Exam:  BP 133/65 (BP Location: Right Arm, Patient Position: Sitting, Cuff Size: Normal)   Pulse 95   Temp 98.4 F (36.9 C) (Oral)   Resp 16   Ht 6' (1.829 m)   Wt 167 lb 12.8 oz (76.1 kg)   SpO2 96%   BMI 22.76 kg/m  Gen:  Patient sitting on exam table, appears stated age in no acute distress Head: Normocephalic atraumatic Eyes: EOMI, PERRL, sclera without injection.  Does have notable conjunctival erythema, consistent with P. Vera diagnosis.   Ears:  Canals clear bilaterally.  TMs pearly gray bilaterally without erythema or bulging.   Nose:  Nasal turbinates grossly enlarged bilaterally.  With some exudates. Mouth: Mucosa membranes moist. Tonsils +2, nonenlarged, non-erythematous. Neck: No cervical lymphadenopathy noted Heart:  RRR, no murmurs auscultated. Pulm:  Clear to auscultation bilaterally with good air movement.  No wheezes or rales noted.    Assessment and Plan:  1.  URI: Plan to treat due to length of symptoms and no improvement.   Will prescribe amoxicillin x 10 days. Instructed patient to return in 1 week for checkup or sooner if worsening or no improvement.   2.  Cough:  Most likely reactive from post nasal drip. Lungs sound good, no history of asthma.

## 2017-01-26 ENCOUNTER — Ambulatory Visit (INDEPENDENT_AMBULATORY_CARE_PROVIDER_SITE_OTHER): Payer: Medicare Other | Admitting: Urgent Care

## 2017-01-26 ENCOUNTER — Encounter: Payer: Self-pay | Admitting: Urgent Care

## 2017-01-26 VITALS — BP 128/64 | HR 88 | Temp 98.6°F | Resp 16 | Ht 72.0 in | Wt 164.2 lb

## 2017-01-26 DIAGNOSIS — R0982 Postnasal drip: Secondary | ICD-10-CM | POA: Diagnosis not present

## 2017-01-26 DIAGNOSIS — J029 Acute pharyngitis, unspecified: Secondary | ICD-10-CM | POA: Diagnosis not present

## 2017-01-26 DIAGNOSIS — R05 Cough: Secondary | ICD-10-CM | POA: Diagnosis not present

## 2017-01-26 DIAGNOSIS — R059 Cough, unspecified: Secondary | ICD-10-CM

## 2017-01-26 MED ORDER — AZITHROMYCIN 250 MG PO TABS: ORAL_TABLET | ORAL | 0 refills | Status: DC

## 2017-01-26 NOTE — Progress Notes (Signed)
    MRN: [id number] DOB: [birth year]  Subjective:   Benjamin [name] is a 66 y.o. male presenting for follow up on sore throat. Was seen on 01/01/2016, treated with amoxicillin for upper respiratory infection. Today, reports that his sore throat never improved. Patient has had recurrence of his cough, hoarseness, fatigue, difficulty swallowing. Denies sinus pain, sinus congestion, fever, ear pain, ear drainage, chest pain, shob, wheezing, rashes, n/v, abdominal pain. Denies history of tonsillectomy but did have bouts of tonsillitis as a child. Denies smoking cigarettes.   [place] has a current medication list which includes the following prescription(s): aspirin, b-complex with vitamin c, vitamin d, finacea, magnesium, fish oil, sulfamethoxazole-trimethoprim, and vitamin e. Also is allergic to keflex [cephalexin]; biaxin [clarithromycin]; flagyl [metronidazole]; and morphine and related. [place]  has a past medical history of Anemia; Clotting disorder ([place]); Colon polyps; DVT (deep venous thrombosis) ([place]); Focal dystonia; GERD (gastroesophageal reflux disease); P. vera ([place]) (08/19/2011); Perforated bowel ([place]); Perforated stomach ([place]); Polycythemia; and Sepsis ([place]). Also  has a past surgical history that includes Wisdom tooth extraction; Finger arthroplasty (1998); Inguinal hernia repair (2007); Inguinal hernia repair (2009); Colonoscopy; Dupuytren contracture release (Left, 08/08/2013); Colonoscopy; Hernia repair; and laparotomy (N/A, 12/15/2015).  Objective:   Vitals: BP 128/64 (BP Location: Right Arm, Patient Position: Sitting, Cuff Size: Normal)   Pulse 88   Temp 98.6 F (37 C) (Oral)   Resp 16   Ht 6' (1.829 m)   Wt 164 lb 3.2 oz (74.5 kg)   SpO2 98%   BMI 22.27 kg/m   Physical Exam  Constitutional: He is oriented to person, place, and time. He appears well-developed and well-nourished.  HENT:  TM's intact bilaterally, no effusions or erythema. Nasal turbinates pink and moist,  nasal passages patent. No sinus tenderness. Oropharynx with moderate post-nasal drainage and slight erythema, mucous membranes moist.  Eyes: Right eye exhibits no discharge. Left eye exhibits no discharge.  Neck: Normal range of motion. Neck supple.  Cardiovascular: Normal rate, regular rhythm and intact distal pulses.  Exam reveals no gallop and no friction rub.   No murmur heard. Pulmonary/Chest: No respiratory distress. He has no wheezes. He has no rales.  Coarse lung sounds mid-lower lung fields.  Lymphadenopathy:    He has no cervical adenopathy.  Neurological: He is alert and oriented to person, place, and time.  Skin: Skin is warm and dry.  Psychiatric: He has a normal mood and affect.   Assessment and Plan :   1. Sore throat 2. Cough 3. Post-nasal drainage - Start azithromycin, supportive care. Patient is to use Zyrtec to address any allergy component for his post-nasal drainage which I suspect is the primary source of his sore throat and cough. RTC in 4 days if no improvement.  Jaynee Eagles, PA-C [facility] [phone] 01/26/2017 3:13 PM

## 2017-01-26 NOTE — Patient Instructions (Addendum)
For sore throat try using a honey-based tea. Use 3 teaspoons of honey with juice squeezed from half lemon. Place shaved pieces of ginger into 1/2-1 cup of water and warm over stove top. Then mix the ingredients and repeat every 4 hours as needed.    Sore Throat A sore throat is pain, burning, irritation, or scratchiness in the throat. When you have a sore throat, you may feel pain or tenderness in your throat when you swallow or talk. Many things can cause a sore throat, including:  An infection.  Seasonal allergies.  Dryness in the air.  Irritants, such as smoke or pollution.  Gastroesophageal reflux disease (GERD).  A tumor.  A sore throat is often the first sign of another sickness. It may happen with other symptoms, such as coughing, sneezing, fever, and swollen neck glands. Most sore throats go away without medical treatment. Follow these instructions at home:  Take over-the-counter medicines only as told by your health care provider.  Drink enough fluids to keep your urine clear or pale yellow.  Rest as needed.  To help with pain, try: ? Sipping warm liquids, such as broth, herbal tea, or warm water. ? Eating or drinking cold or frozen liquids, such as frozen ice pops. ? Gargling with a salt-water mixture 3-4 times a day or as needed. To make a salt-water mixture, completely dissolve -1 tsp of salt in 1 cup of warm water. ? Sucking on hard candy or throat lozenges. ? Putting a cool-mist humidifier in your bedroom at night to moisten the air. ? Sitting in the bathroom with the door closed for 5-10 minutes while you run hot water in the shower.  Do not use any tobacco products, such as cigarettes, chewing tobacco, and e-cigarettes. If you need help quitting, ask your health care provider. Contact a health care provider if:  You have a fever for more than 2-3 days.  You have symptoms that last (are persistent) for more than 2-3 days.  Your throat does not get better  within 7 days.  You have a fever and your symptoms suddenly get worse. Get help right away if:  You have difficulty breathing.  You cannot swallow fluids, soft foods, or your saliva.  You have increased swelling in your throat or neck.  You have persistent nausea and vomiting. This information is not intended to replace advice given to you by your health care provider. Make sure you discuss any questions you have with your health care provider. Document Released: 09/17/2004 Document Revised: 04/05/2016 Document Reviewed: 05/31/2015 Elsevier Interactive Patient Education  2018 [employer].     Cough, Adult Coughing is a reflex that clears your throat and your airways. Coughing helps to heal and protect your lungs. It is normal to cough occasionally, but a cough that happens with other symptoms or lasts a long time may be a sign of a condition that needs treatment. A cough may last only 2-3 weeks (acute), or it may last longer than 8 weeks (chronic). What are the causes? Coughing is commonly caused by:  Breathing in substances that irritate your lungs.  A viral or bacterial respiratory infection.  Allergies.  Asthma.  Postnasal drip.  Smoking.  Acid backing up from the stomach into the esophagus (gastroesophageal reflux).  Certain medicines.  Chronic lung problems, including COPD (or rarely, lung cancer).  Other medical conditions such as heart failure.  Follow these instructions at home: Pay attention to any changes in your symptoms. Take these actions to  help with your discomfort:  Take medicines only as told by your health care provider. ? If you were prescribed an antibiotic medicine, take it as told by your health care provider. Do not stop taking the antibiotic even if you start to feel better. ? Talk with your health care provider before you take a cough suppressant medicine.  Drink enough fluid to keep your urine clear or pale yellow.  If the air is dry,  use a cold steam vaporizer or humidifier in your bedroom or your home to help loosen secretions.  Avoid anything that causes you to cough at work or at home.  If your cough is worse at night, try sleeping in a semi-upright position.  Avoid cigarette smoke. If you smoke, quit smoking. If you need help quitting, ask your health care provider.  Avoid caffeine.  Avoid alcohol.  Rest as needed.  Contact a health care provider if:  You have new symptoms.  You cough up pus.  Your cough does not get better after 2-3 weeks, or your cough gets worse.  You cannot control your cough with suppressant medicines and you are losing sleep.  You develop pain that is getting worse or pain that is not controlled with pain medicines.  You have a fever.  You have unexplained weight loss.  You have night sweats. Get help right away if:  You cough up blood.  You have difficulty breathing.  Your heartbeat is very fast. This information is not intended to replace advice given to you by your health care provider. Make sure you discuss any questions you have with your health care provider. Document Released: 02/06/2011 Document Revised: 01/16/2016 Document Reviewed: 10/17/2014 Elsevier Interactive Patient Education  2017 [employer].     IF you received an x-ray today, you will receive an invoice from [facility]. Please contact [facility] at [phone] with questions or concerns regarding your invoice.   IF you received labwork today, you will receive an invoice from [facility]. Please contact [facility] at [phone] with questions or concerns regarding your invoice.   Our billing staff will not be able to assist you with questions regarding bills from these companies.  You will be contacted with the lab results as soon as they are available. The fastest way to get your results is to activate your My Chart account. Instructions are located on the last page of this  paperwork. If you have not heard from [place] regarding the results in 2 weeks, please contact this office.

## 2017-03-03 ENCOUNTER — Ambulatory Visit (HOSPITAL_BASED_OUTPATIENT_CLINIC_OR_DEPARTMENT_OTHER): Payer: Medicare Other | Admitting: Family

## 2017-03-03 ENCOUNTER — Ambulatory Visit (HOSPITAL_BASED_OUTPATIENT_CLINIC_OR_DEPARTMENT_OTHER): Payer: Medicare Other

## 2017-03-03 ENCOUNTER — Other Ambulatory Visit (HOSPITAL_BASED_OUTPATIENT_CLINIC_OR_DEPARTMENT_OTHER): Payer: Medicare Other

## 2017-03-03 VITALS — BP 127/60 | HR 70 | Resp 16

## 2017-03-03 VITALS — BP 131/70 | HR 74 | Temp 98.3°F | Resp 20 | Wt 169.0 lb

## 2017-03-03 DIAGNOSIS — D45 Polycythemia vera: Secondary | ICD-10-CM

## 2017-03-03 DIAGNOSIS — D500 Iron deficiency anemia secondary to blood loss (chronic): Secondary | ICD-10-CM

## 2017-03-03 DIAGNOSIS — Z7982 Long term (current) use of aspirin: Secondary | ICD-10-CM

## 2017-03-03 LAB — CBC WITH DIFFERENTIAL (CANCER CENTER ONLY)
BASO#: 0.2 10*3/uL (ref 0.0–0.2)
BASO%: 0.7 % (ref 0.0–2.0)
EOS ABS: 0.2 10*3/uL (ref 0.0–0.5)
EOS%: 0.8 % (ref 0.0–7.0)
HCT: 47.1 % (ref 38.7–49.9)
HGB: 14 g/dL (ref 13.0–17.1)
LYMPH#: 1.5 10*3/uL (ref 0.9–3.3)
LYMPH%: 6.7 % — AB (ref 14.0–48.0)
MCH: 20.9 pg — AB (ref 28.0–33.4)
MCHC: 29.7 g/dL — ABNORMAL LOW (ref 32.0–35.9)
MCV: 70 fL — AB (ref 82–98)
MONO#: 0.7 10*3/uL (ref 0.1–0.9)
MONO%: 3.3 % (ref 0.0–13.0)
NEUT#: 19.6 10*3/uL — ABNORMAL HIGH (ref 1.5–6.5)
NEUT%: 88.5 % — AB (ref 40.0–80.0)
PLATELETS: 295 10*3/uL (ref 145–400)
RBC: 6.69 10*6/uL — ABNORMAL HIGH (ref 4.20–5.70)
RDW: 22 % — AB (ref 11.1–15.7)
WBC: 22.2 10*3/uL — ABNORMAL HIGH (ref 4.0–10.0)

## 2017-03-03 LAB — COMPREHENSIVE METABOLIC PANEL (CC13)
A/G RATIO: 2 (ref 1.2–2.2)
ALT: 34 IU/L (ref 0–44)
AST: 34 IU/L (ref 0–40)
Albumin, Serum: 4.4 g/dL (ref 3.6–4.8)
Alkaline Phosphatase, S: 1031 IU/L — ABNORMAL HIGH (ref 39–117)
BUN/Creatinine Ratio: 13 (ref 10–24)
BUN: 13 mg/dL (ref 8–27)
Bilirubin Total: 0.8 mg/dL (ref 0.0–1.2)
CALCIUM: 10.1 mg/dL (ref 8.6–10.2)
CHLORIDE: 101 mmol/L (ref 96–106)
Carbon Dioxide, Total: 28 mmol/L (ref 20–29)
Creatinine, Ser: 1.01 mg/dL (ref 0.76–1.27)
GFR calc Af Amer: 89 mL/min/{1.73_m2} (ref >59)
GFR, EST NON AFRICAN AMERICAN: 77 mL/min/{1.73_m2} (ref >59)
GLUCOSE: 96 mg/dL (ref 65–99)
Globulin, Total: 2.2 g/dL (ref 1.5–4.5)
POTASSIUM: 4.7 mmol/L (ref 3.5–5.2)
Sodium: 139 mmol/L (ref 134–144)
TOTAL PROTEIN: 6.6 g/dL (ref 6.0–8.5)

## 2017-03-03 LAB — TECHNOLOGIST REVIEW CHCC SATELLITE

## 2017-03-03 NOTE — Progress Notes (Signed)
Hematology and Oncology Follow Up Visit  Benjamin [name] [id number] 1950/09/22 66 y.o. 03/03/2017   Principle Diagnosis:  Polycythemia vera -- JAK2 positive. Perforated gastric ulcer-H pylori positive   Current Therapy:   Phlebotomy to maintained Hct < 45% EC aspirin 81 mg PO daily    Interim History:  Benjamin [name] is here today for follow-up. He is doing well and has no complaints at this time. 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  His Hct is 47.1. He verbalized that he is taking his 1 baby aspirin daily as prescribed. We will do a phlebotomy today.  He still has itching after taking a hot showed that lasts 10-15 minutes.  No fever, chills, n/v, cough, dizziness, SOB, chest pain, palpitations, abdominal pain or changes in bowel or bladder habits.  No numbness or tingling in his extremities. He has occasional swelling in the right foot and ankle due to the way he sits with his right leg folded while playing guitar. This improves once he changes position and moves around. He has also noticed that when he avoids alcoholic beverages he has no swelling. He has maintained a good appetite and is staying well hydrated. His weight is stable.   ECOG Performance Status: 0 - Asymptomatic  Medications:  Allergies as of 03/03/2017      Reactions   Keflex [cephalexin] Hives   Biaxin [clarithromycin]    Joint pains   Flagyl [metronidazole] Other (See Comments)   Joint pain   Morphine And Related Hives      Medication List       Accurate as of 03/03/17  3:19 PM. Always use your most recent med list.          aspirin 81 MG tablet Take 81 mg by mouth every morning.   B-complex with vitamin C tablet Take 1 tablet by mouth daily.   FINACEA 15 % Foam Generic drug:  Azelaic Acid   Fish Oil 1000 MG Caps Take 2 capsules by mouth daily.   Magnesium 400 MG Caps Take 1 capsule by mouth daily.   sulfamethoxazole-trimethoprim 800-160 MG tablet Commonly  known as:  BACTRIM DS,SEPTRA DS Take by mouth daily. Takes as needed for acne.   Vitamin D 2000 units Caps Take by mouth.   Vitamin E 400 units Tabs Take 1 tablet by mouth daily.       Allergies:  Allergies  Allergen Reactions  . Keflex [Cephalexin] Hives  . Biaxin [Clarithromycin]     Joint pains  . Flagyl [Metronidazole] Other (See Comments)    Joint pain  . Morphine And Related Hives    Past Medical History, Surgical history, Social history, and Family History were reviewed and updated.  Review of Systems: All other 10 point review of systems is negative.   Physical Exam:  weight is 169 lb (76.7 kg). His oral temperature is 98.3 F (36.8 C). His blood pressure is 131/70 and his pulse is 74. His respiration is 20 and oxygen saturation is 98%.   Wt Readings from Last 3 Encounters:  03/03/17 169 lb (76.7 kg)  01/26/17 164 lb 3.2 oz (74.5 kg)  12/31/16 167 lb 12.8 oz (76.1 kg)    Ocular: Sclerae unicteric, pupils equal, round and reactive to light Ear-nose-throat: Oropharynx clear, dentition fair Lymphatic: No cervical, supraclavicular or axillary adenopathy Lungs no rales or rhonchi, good excursion bilaterally Heart regular rate and rhythm, no murmur appreciated Abd soft, nontender, positive bowel sounds, no liver  or spleen tip palpated on exam, no fluid wave MSK no focal spinal tenderness, no joint edema Neuro: non-focal, well-oriented, appropriate affect Breasts: Deferred   Lab Results  Component Value Date   WBC 22.2 (H) 03/03/2017   HGB 14.0 03/03/2017   HCT 47.1 03/03/2017   MCV 70 (L) 03/03/2017   PLT 295 03/03/2017   Lab Results  Component Value Date   FERRITIN 14 (L) 12/23/2016   IRON 20 (L) 12/23/2016   TIBC 394 12/23/2016   UIBC 374 12/23/2016   IRONPCTSAT 5 (L) 12/23/2016   Lab Results  Component Value Date   RETICCTPCT 2.7 (H) 02/27/2015   RBC 6.69 (H) 03/03/2017   RETICCTABS 173.6 02/27/2015   No results found for: KPAFRELGTCHN,  LAMBDASER, KAPLAMBRATIO No results found for: IGGSERUM, IGA, IGMSERUM No results found for: Odetta Pink, SPEI   Chemistry      Component Value Date/Time   NA 140 12/23/2016 1406   NA 141 05/14/2016 1202   K 4.4 12/23/2016 1406   K 4.8 05/14/2016 1202   CL 105 12/23/2016 1406   CO2 29 12/23/2016 1406   CO2 26 05/14/2016 1202   BUN 15 12/23/2016 1406   BUN 14.0 05/14/2016 1202   CREATININE 1.0 12/23/2016 1406   CREATININE 1.0 05/14/2016 1202      Component Value Date/Time   CALCIUM 9.1 12/23/2016 1406   CALCIUM 9.2 05/14/2016 1202   ALKPHOS 875 (H) 12/23/2016 1406   ALKPHOS 913 (H) 05/14/2016 1202   AST 38 12/23/2016 1406   AST 39 (H) 05/14/2016 1202   ALT 47 12/23/2016 1406   ALT 41 05/14/2016 1202   BILITOT 1.10 12/23/2016 1406   BILITOT 1.00 05/14/2016 1202      Impression and Plan: Benjamin [name] is a very pleasant 66 yo caucasian gentleman with polycythemia vera, JAK2 positive. He has done well with aspirin and responded nicely to phlebotomy. His Hct is 47.1 so we will proceed with phlebotomy today.  He has no complaints at this time and states that he is doing well.  We will continue to follow along with him and plan to see him back again in 2 months.  He will contact our office with any questions or concerns. We can certainly see him sooner if need be.   Benjamin Bottom, NP 7/11/20183:19 PM

## 2017-03-03 NOTE — Progress Notes (Signed)
Benjamin [name] presents today for phlebotomy per MD orders. Phlebotomy procedure started at 1537 and ended at 1547. 589 grams removed using 16g phlebotomy kit from lt [facility]. Patient observed for 30 minutes after procedure without any incident. Patient tolerated procedure well. IV needle removed intact.

## 2017-03-03 NOTE — Patient Instructions (Signed)
Therapeutic Phlebotomy Therapeutic phlebotomy is the controlled removal of blood from a person's body for the purpose of treating a medical condition. The procedure is similar to donating blood. Usually, about a pint (470 mL, or 0.47L) of blood is removed. The average adult has 9-12 pints (4.3-5.7 L) of blood. Therapeutic phlebotomy may be used to treat the following medical conditions:  Hemochromatosis. This is a condition in which the blood contains too much iron.  Polycythemia vera. This is a condition in which the blood contains too many red blood cells.  Porphyria cutanea tarda. This is a disease in which an important part of hemoglobin is not made properly. It results in the buildup of abnormal amounts of porphyrins in the body.  Sickle cell disease. This is a condition in which the red blood cells form an abnormal crescent shape rather than a round shape.  Tell a health care provider about:  Any allergies you have.  All medicines you are taking, including vitamins, herbs, eye drops, creams, and over-the-counter medicines.  Any problems you or family members have had with anesthetic medicines.  Any blood disorders you have.  Any surgeries you have had.  Any medical conditions you have. What are the risks? Generally, this is a safe procedure. However, problems may occur, including:  Nausea or light-headedness.  Low blood pressure.  Soreness, bleeding, swelling, or bruising at the needle insertion site.  Infection.  What happens before the procedure?  Follow instructions from your health care provider about eating or drinking restrictions.  Ask your health care provider about changing or stopping your regular medicines. This is especially important if you are taking diabetes medicines or blood thinners.  Wear clothing with sleeves that can be raised above the elbow.  Plan to have someone take you home after the procedure.  You may have a blood sample taken. What  happens during the procedure?  A needle will be inserted into one of your veins.  Tubing and a collection bag will be attached to that needle.  Blood will flow through the needle and tubing into the collection bag.  You may be asked to open and close your hand slowly and continually during the entire collection.  After the specified amount of blood has been removed from your body, the collection bag and tubing will be clamped.  The needle will be removed from your vein.  Pressure will be held on the site of the needle insertion to stop the bleeding.  A bandage (dressing) will be placed over the needle insertion site. The procedure may vary among health care providers and hospitals. What happens after the procedure?  Your recovery will be assessed and monitored.  You can return to your normal activities as directed by your health care provider. This information is not intended to replace advice given to you by your health care provider. Make sure you discuss any questions you have with your health care provider. Document Released: 01/12/2011 Document Revised: 04/11/2016 Document Reviewed: 08/06/2014 Elsevier Interactive Patient Education  2018 [employer] Inc[employer]  

## 2017-03-04 LAB — FERRITIN: FERRITIN: 20 ng/mL — AB (ref 22–316)

## 2017-03-04 LAB — IRON AND TIBC
%SAT: 6 % — ABNORMAL LOW (ref 20–55)
IRON: 21 ug/dL — AB (ref 42–163)
TIBC: 353 ug/dL (ref 202–409)
UIBC: 331 ug/dL (ref 117–376)

## 2017-04-28 ENCOUNTER — Other Ambulatory Visit (HOSPITAL_BASED_OUTPATIENT_CLINIC_OR_DEPARTMENT_OTHER): Payer: Medicare Other

## 2017-04-28 ENCOUNTER — Ambulatory Visit (HOSPITAL_BASED_OUTPATIENT_CLINIC_OR_DEPARTMENT_OTHER): Payer: Medicare Other | Admitting: Hematology & Oncology

## 2017-04-28 VITALS — BP 131/78 | HR 71 | Temp 98.1°F | Resp 17 | Wt 167.0 lb

## 2017-04-28 DIAGNOSIS — R748 Abnormal levels of other serum enzymes: Secondary | ICD-10-CM | POA: Diagnosis not present

## 2017-04-28 DIAGNOSIS — D45 Polycythemia vera: Secondary | ICD-10-CM | POA: Diagnosis not present

## 2017-04-28 LAB — CMP (CANCER CENTER ONLY)
ALBUMIN: 3.4 g/dL (ref 3.3–5.5)
ALT: 39 U/L (ref 10–47)
AST: 37 U/L (ref 11–38)
BILIRUBIN TOTAL: 0.9 mg/dL (ref 0.20–1.60)
BUN, Bld: 13 mg/dL (ref 7–22)
CALCIUM: 9 mg/dL (ref 8.0–10.3)
CO2: 29 mEq/L (ref 18–33)
CREATININE: 1.2 mg/dL (ref 0.6–1.2)
Chloride: 105 mEq/L (ref 98–108)
Glucose, Bld: 76 mg/dL (ref 73–118)
Potassium: 5.1 mEq/L — ABNORMAL HIGH (ref 3.3–4.7)
SODIUM: 142 meq/L (ref 128–145)
Total Protein: 6.4 g/dL (ref 6.4–8.1)

## 2017-04-28 LAB — CBC WITH DIFFERENTIAL (CANCER CENTER ONLY)
BASO#: 0.1 10*3/uL (ref 0.0–0.2)
BASO%: 0.6 % (ref 0.0–2.0)
EOS%: 0.8 % (ref 0.0–7.0)
Eosinophils Absolute: 0.2 10*3/uL (ref 0.0–0.5)
HEMATOCRIT: 45.2 % (ref 38.7–49.9)
HEMOGLOBIN: 13.3 g/dL (ref 13.0–17.1)
LYMPH#: 1.7 10*3/uL (ref 0.9–3.3)
LYMPH%: 6.9 % — ABNORMAL LOW (ref 14.0–48.0)
MCH: 21 pg — ABNORMAL LOW (ref 28.0–33.4)
MCHC: 29.4 g/dL — ABNORMAL LOW (ref 32.0–35.9)
MCV: 71 fL — ABNORMAL LOW (ref 82–98)
MONO#: 0.7 10*3/uL (ref 0.1–0.9)
MONO%: 2.7 % (ref 0.0–13.0)
NEUT%: 89 % — ABNORMAL HIGH (ref 40.0–80.0)
NEUTROS ABS: 21.7 10*3/uL — AB (ref 1.5–6.5)
Platelets: 310 10*3/uL (ref 145–400)
RBC: 6.34 10*6/uL — AB (ref 4.20–5.70)
RDW: 21.8 % — ABNORMAL HIGH (ref 11.1–15.7)
WBC: 24.3 10*3/uL — AB (ref 4.0–10.0)

## 2017-04-28 LAB — TECHNOLOGIST REVIEW CHCC SATELLITE

## 2017-04-28 NOTE — Progress Notes (Signed)
Hematology and Oncology Follow Up Visit  Benjamin [name] [id number] [birth year] 66 y.o. 04/28/2017   Principle Diagnosis:  Polycythemia vera -- JAK2 positive. Perforated gastric ulcer-H pylori positive   Current Therapy:   Phlebotomy to maintained Hct < 45% EC aspirin 81 mg PO daily    Interim History:  Benjamin [name] is here today for follow-up. He has had a nice summer. 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  He is looking forward to going to the [employer] this weekend in town. He is looking forward to this.  He's had no abdominal pain. He's had no nausea or vomiting. It is been about 15 months since he had his abdominal surgery for a bleeding ulcer.  He's had no cough. He's had no shortness of breath. He's had no rashes. He's had no leg swelling. He's had no headache.  His mom passed away in 10-29-2022. 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  Overall, his performance status is ECOG 0.   Medications:  Allergies as of 04/28/2017      Reactions   Keflex [cephalexin] Hives   Biaxin [clarithromycin]    Joint pains   Flagyl [metronidazole] Other (See Comments)   Joint pain   Morphine And Related Hives      Medication List       Accurate as of 04/28/17  3:25 PM. Always use your most recent med list.          aspirin 81 MG tablet Take 81 mg by mouth every morning.   B-complex with vitamin C tablet Take 1 tablet by mouth daily.   FINACEA 15 % Foam Generic drug:  Azelaic Acid   Fish Oil 1000 MG Caps Take 2 capsules by mouth daily.   Magnesium 400 MG Caps Take 1 capsule by mouth daily.   sulfamethoxazole-trimethoprim 800-160 MG tablet Commonly known as:  BACTRIM DS,SEPTRA DS Take by mouth daily. Takes as needed for acne.   Vitamin D 2000 units Caps Take by mouth.   Vitamin E 400 units Tabs Take 1 tablet by mouth daily.       Allergies:  Allergies  Allergen Reactions  . Keflex [Cephalexin] Hives  .  Biaxin [Clarithromycin]     Joint pains  . Flagyl [Metronidazole] Other (See Comments)    Joint pain  . Morphine And Related Hives    Past Medical History, Surgical history, Social history, and Family History were reviewed and updated.  Review of Systems:  As stated in the interim history  Physical Exam:  weight is 167 lb (75.8 kg). His oral temperature is 98.1 F (36.7 C). His blood pressure is 131/78 and his pulse is 71. His respiration is 17 and oxygen saturation is 100%.   Wt Readings from Last 3 Encounters:  04/28/17 167 lb (75.8 kg)  03/03/17 169 lb (76.7 kg)  01/26/17 164 lb 3.2 oz (74.5 kg)    Well-developed well-nourished white male. Head and neck exam shows no ocular or oral lesions. He has no adenopathy in the neck. Lungs are clear. Cardiac exam regular rate and rhythm with no murmurs, rubs or bruits. Abdomen is soft. He has no fluid wave. There is no obvious abdominal mass. There is no hepatomegaly. His spleen tip feels a couple 70 and below the left costal margin. Extremities shows no clubbing, cyanosis or edema. Skin exam shows no rashes, ecchymoses or petechia. Neurological exam shows no focal neurological deficits.  Lab Results  Component Value Date   WBC 24.3 (H) 04/28/2017   HGB 13.3 04/28/2017   HCT 45.2 04/28/2017   MCV 71 (L) 04/28/2017   PLT 310 Large platelets present 04/28/2017   Lab Results  Component Value Date   FERRITIN 20 (L) 03/03/2017   IRON 21 (L) 03/03/2017   TIBC 353 03/03/2017   UIBC 331 03/03/2017   IRONPCTSAT 6 (L) 03/03/2017   Lab Results  Component Value Date   RETICCTPCT 2.7 (H) 02/27/2015   RBC 6.34 (H) 04/28/2017   RETICCTABS 173.6 02/27/2015   No results found for: KPAFRELGTCHN, LAMBDASER, KAPLAMBRATIO No results found for: IGGSERUM, IGA, IGMSERUM No results found for: Odetta Pink, SPEI   Chemistry      Component Value Date/Time   NA 142 04/28/2017 1425   NA 141  05/14/2016 1202   K 5.1 no hemolysis noted (H) 04/28/2017 1425   K 4.8 05/14/2016 1202   CL 105 04/28/2017 1425   CO2 29 04/28/2017 1425   CO2 26 05/14/2016 1202   BUN 13 04/28/2017 1425   BUN 14.0 05/14/2016 1202   CREATININE 1.2 04/28/2017 1425   CREATININE 1.0 05/14/2016 1202      Component Value Date/Time   CALCIUM 9.0 04/28/2017 1425   CALCIUM 9.2 05/14/2016 1202   ALKPHOS 1,025 (H) 04/28/2017 1425   ALKPHOS 913 (H) 05/14/2016 1202   AST 37 04/28/2017 1425   AST 39 (H) 05/14/2016 1202   ALT 39 04/28/2017 1425   ALT 41 05/14/2016 1202   BILITOT 0.90 04/28/2017 1425   BILITOT 1.00 05/14/2016 1202      Impression and Plan: Benjamin [name] is a very pleasant 66 yo caucasian gentleman with polycythemia vera, JAK2 positive.   I think we can hold on phlebotomizing him this time.  I am a little bit concerned about the rising alkaline phosphatase. I also think that his spleen might be a little bit larger.  When we see him back in 6 weeks, I want to get an ultrasound of his abdomen so we can assess for his spleen. His last ultrasound was back in April 2017.   We might be at the point where we need to consider starting him on Hydrea.   Volanda Napoleon, MD 9/5/20183:25 PM

## 2017-06-05 IMAGING — DX DG HAND 2V*L*
2 series · 2 of 2 positions shown · non-contrast
Comparison: None.

CLINICAL DATA: Multifocal joint pain in both hands for 2 days. No
known injury. Initial encounter.

EXAM:
LEFT HAND - 2 VIEW

[hand pa]
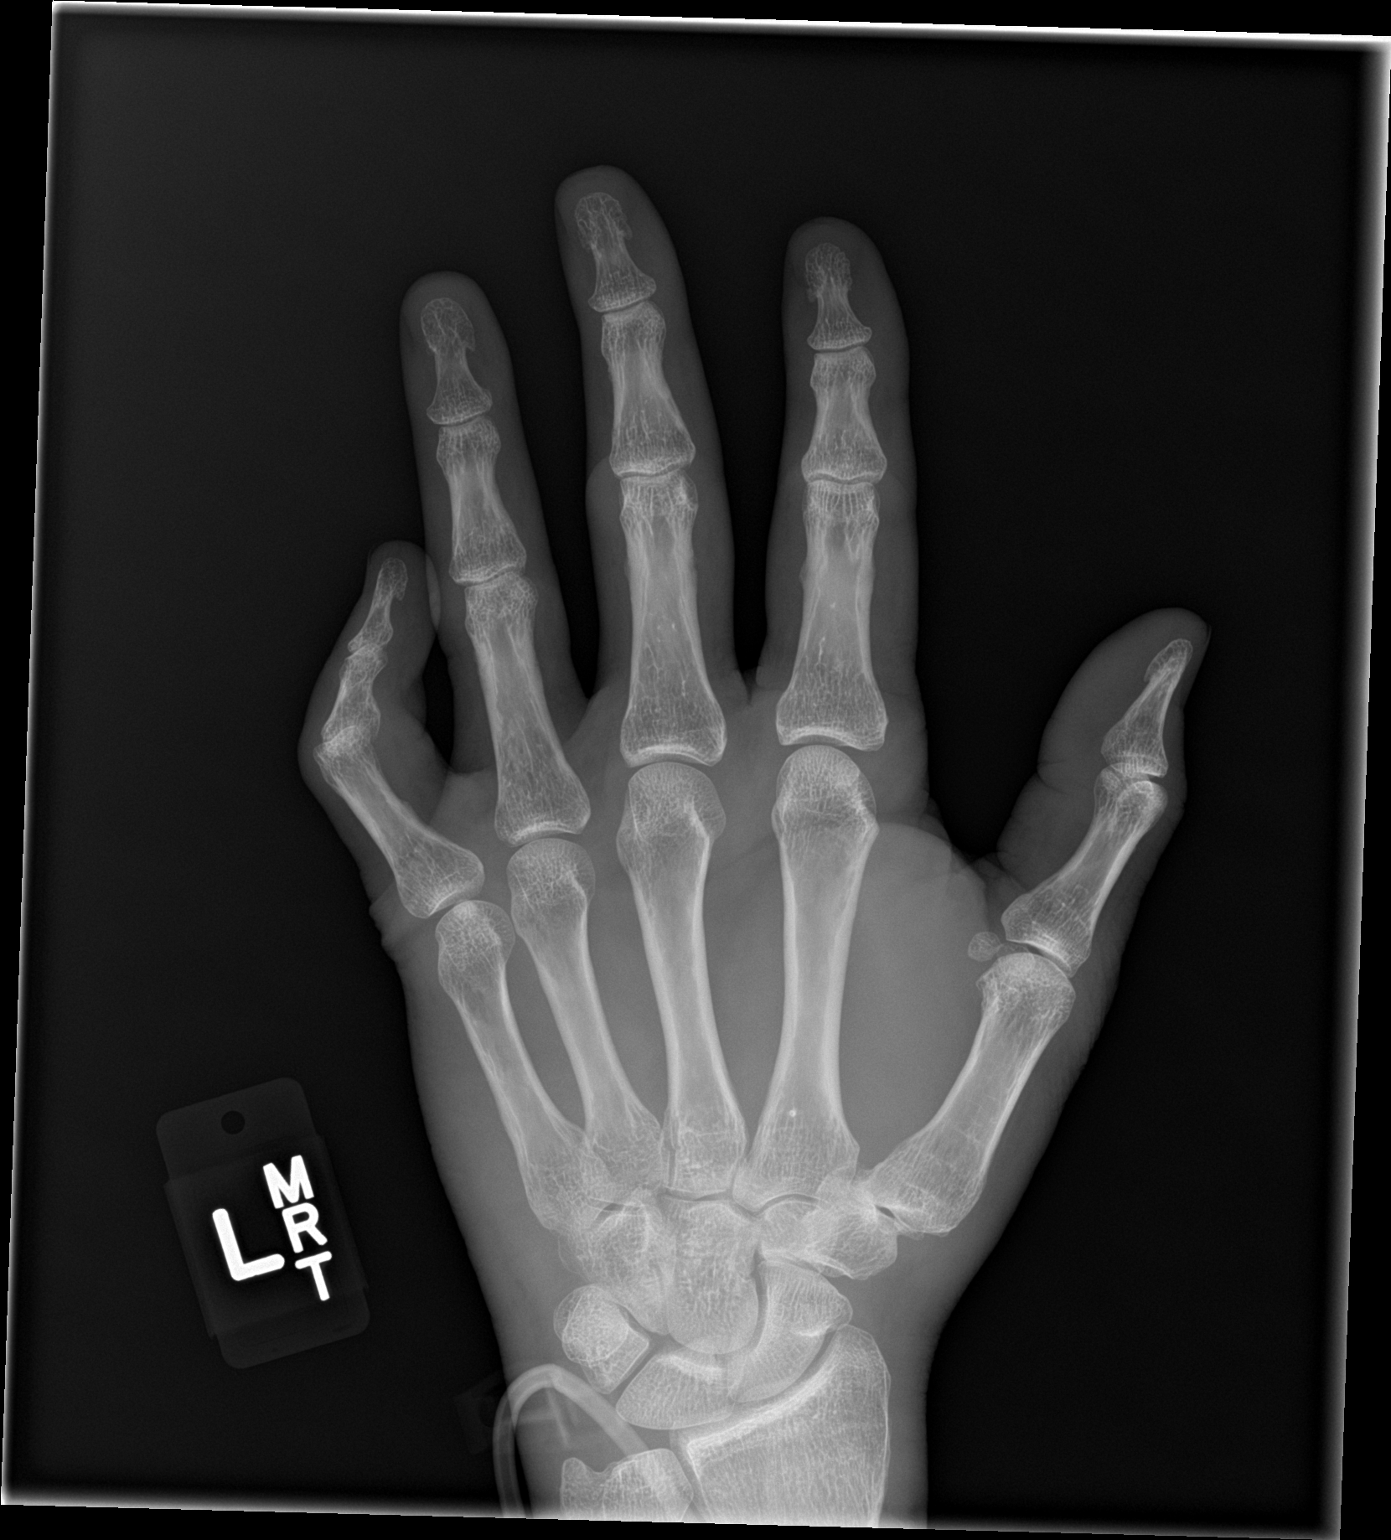

[hand lat]
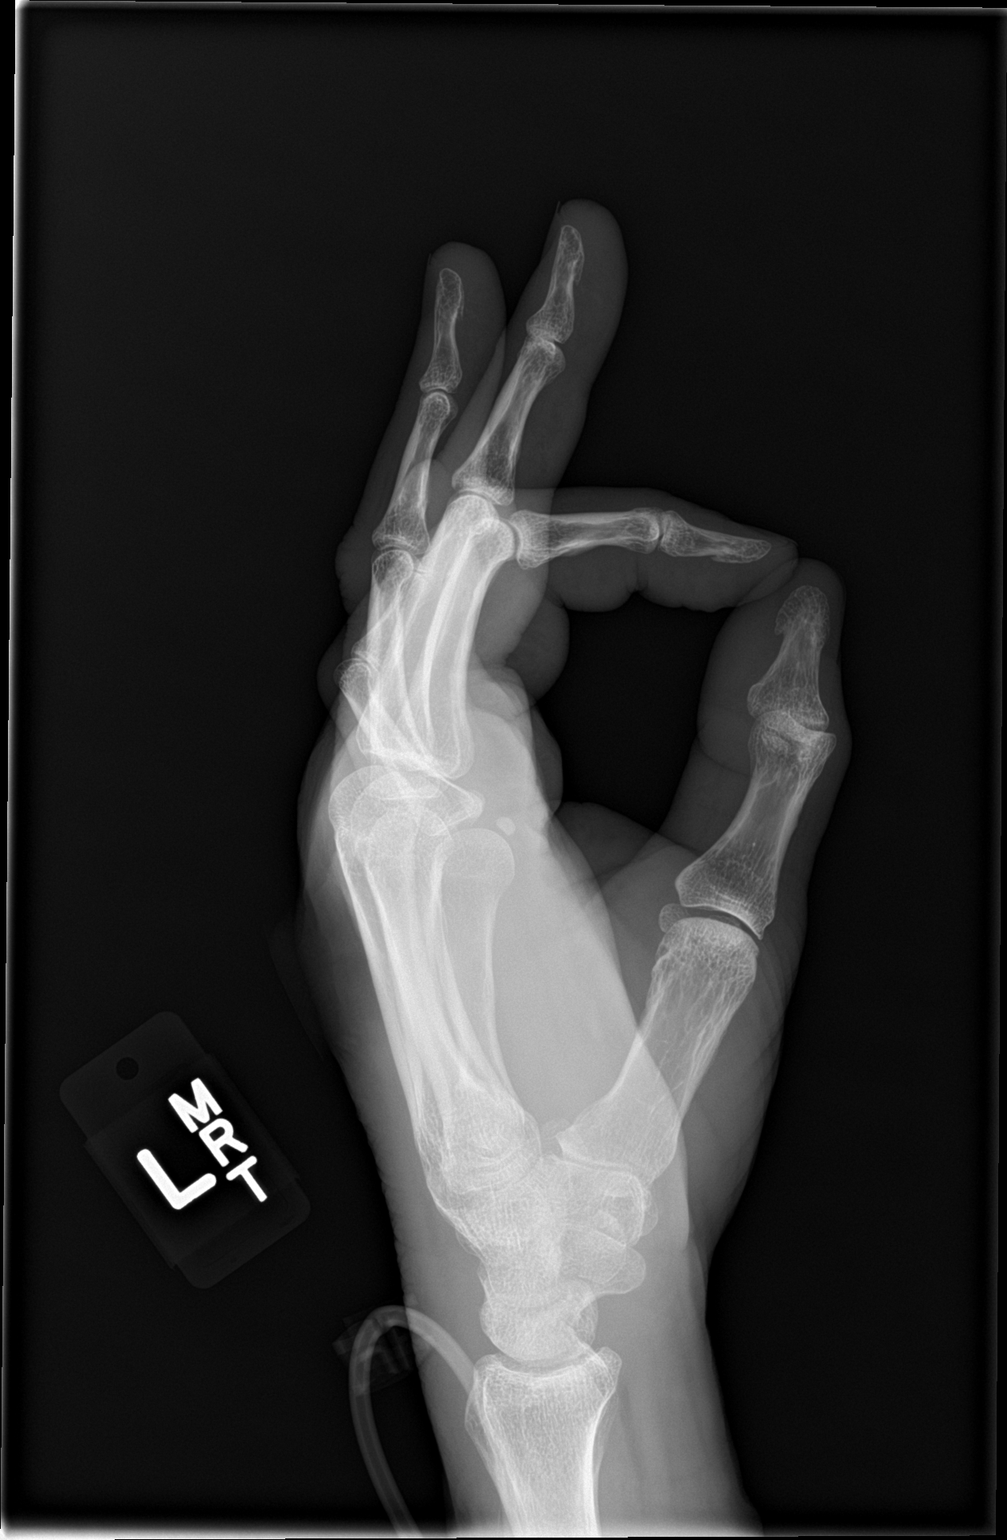

[2 of 2 positions shown; findings below may reference images not displayed]

FINDINGS: There is no evidence of fracture or dislocation. The PIP joint of
the left little finger is in flexion, presumably positional. There
is no evidence of arthropathy or other focal bone abnormality. Soft
tissues are unremarkable.
IMPRESSION: No acute abnormality.

## 2017-06-05 IMAGING — DX DG HAND 2V*R*
2 series · 2 of 2 positions shown · non-contrast
Comparison: None.

CLINICAL DATA: Bilateral hand pain, fever

EXAM:
RIGHT HAND - 2 VIEW

[hand pa]
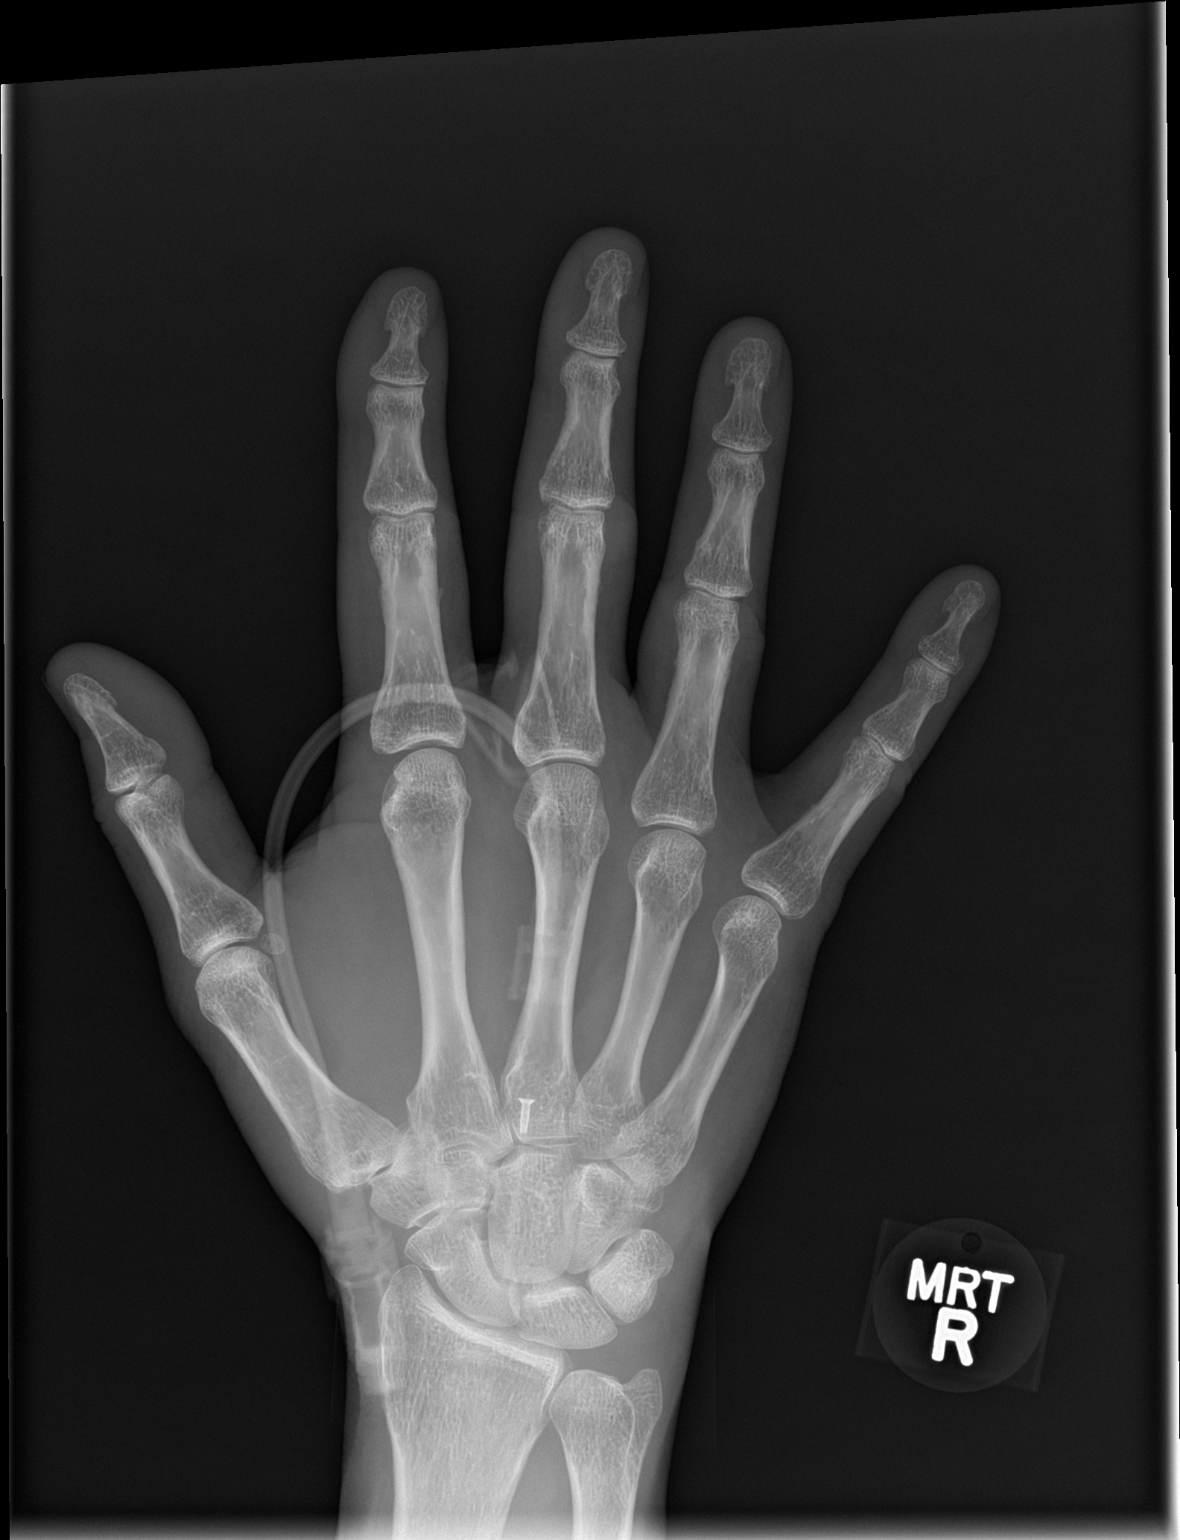

[hand lat]
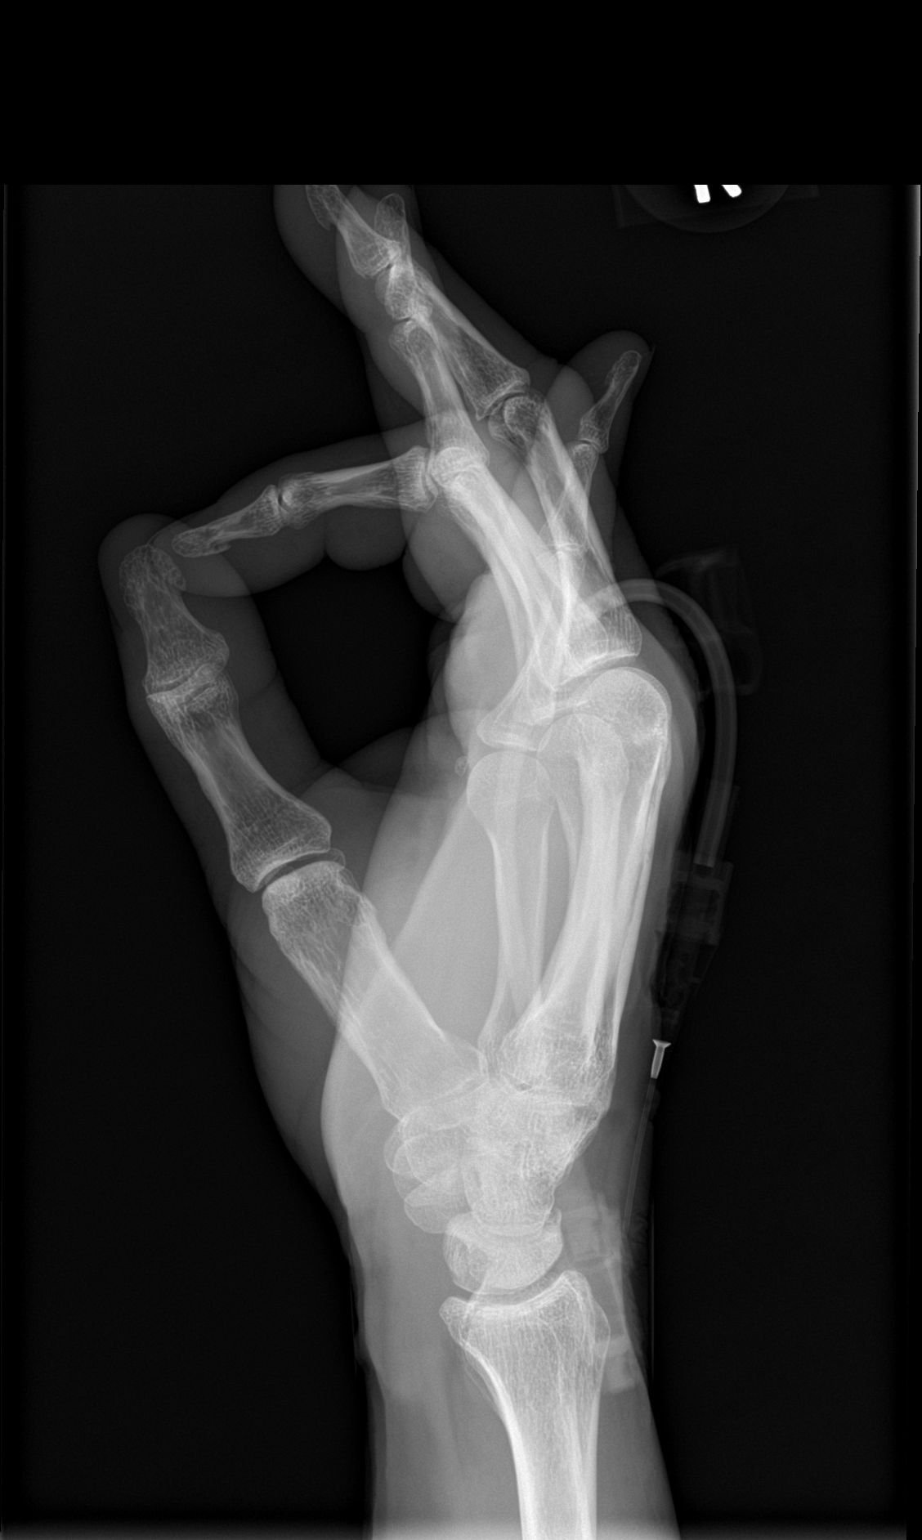

[2 of 2 positions shown; findings below may reference images not displayed]

FINDINGS: No fracture or dislocation is seen.

Possible marginal erosion along the radial aspect of the 3rd PIP
joint, equivocal. Joint spaces are otherwise preserved.

Mild soft tissue swelling involving the 2nd and 3rd digits.

No radiopaque foreign body is seen.
IMPRESSION: Possible marginal erosion at the 3rd PIP joint, although equivocal.
Inflammatory arthropathy is not excluded.

No fracture or dislocation is seen.

## 2017-06-05 IMAGING — CT CT HEAD W/O CM
4 series · 15 of 47 positions shown, 17 images · non-contrast
Comparison: None.

CLINICAL DATA: Stiffness in neck and joints that began today. No
headache is reported.

EXAM:
CT HEAD WITHOUT CONTRAST
TECHNIQUE: Contiguous axial images were obtained from the base of the skull
through the vertex without intravenous contrast.

[Series 2: head without · axial · non-contrast · 0.47mm/px · z∈[-124,-4]mm · 7 of 34 slices shown, 9 images]
[im 5/34  brain]
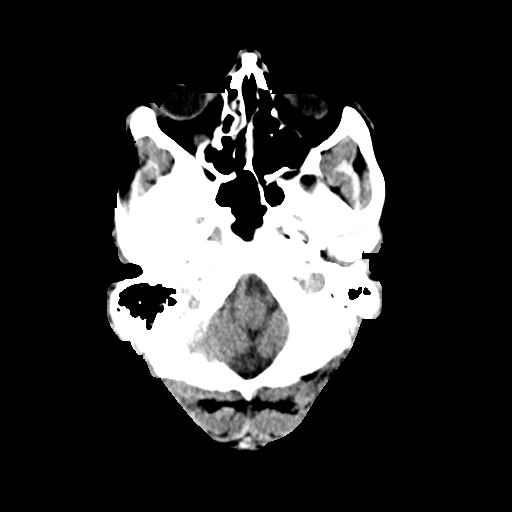
[im 5/34  bone]
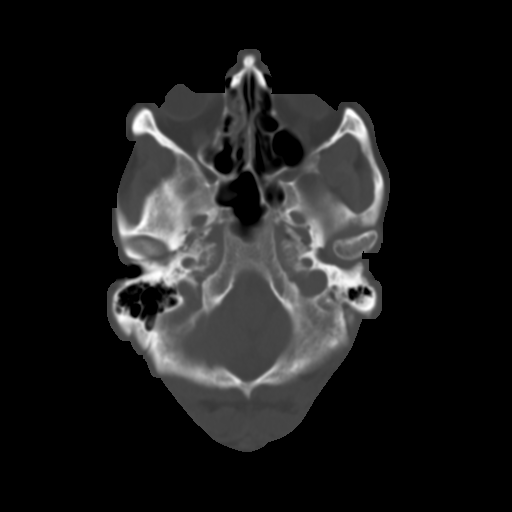
[im 9/34  brain]
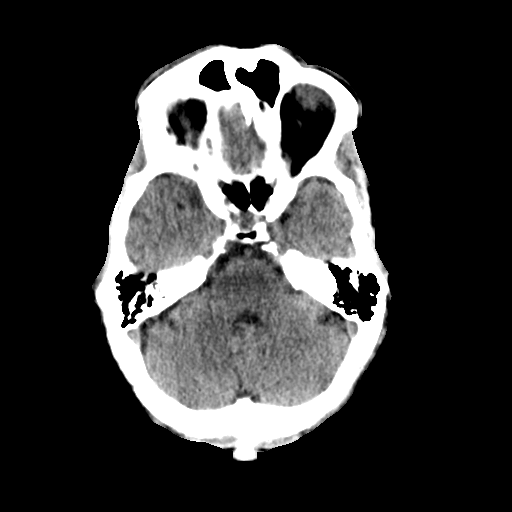
[im 13/34  brain]
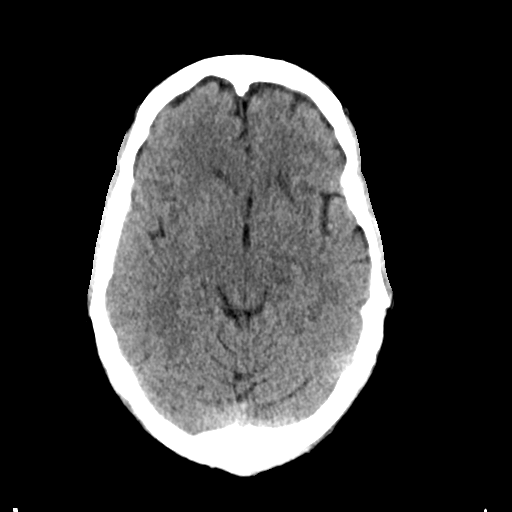
[im 17/34  brain]
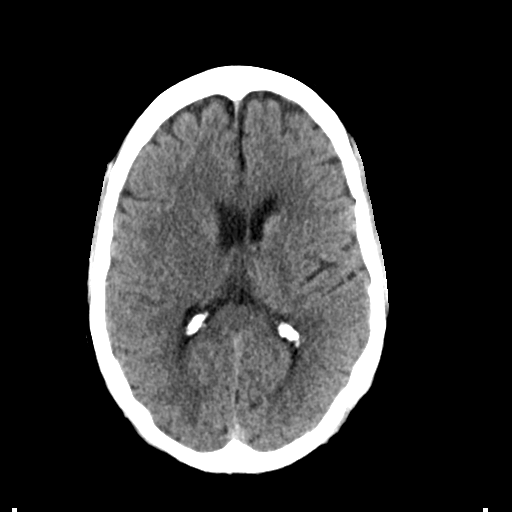
[im 21/34  brain]
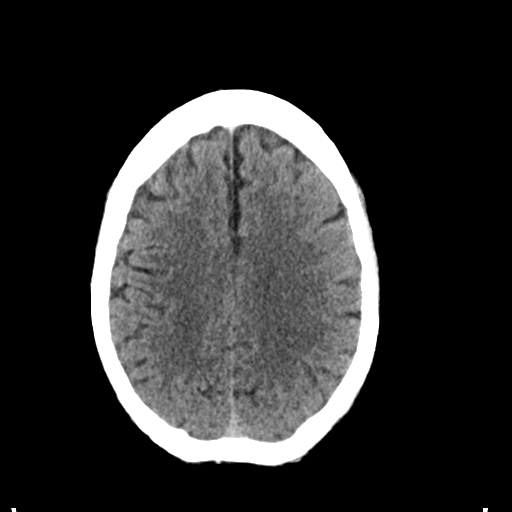
[im 21/34  bone]
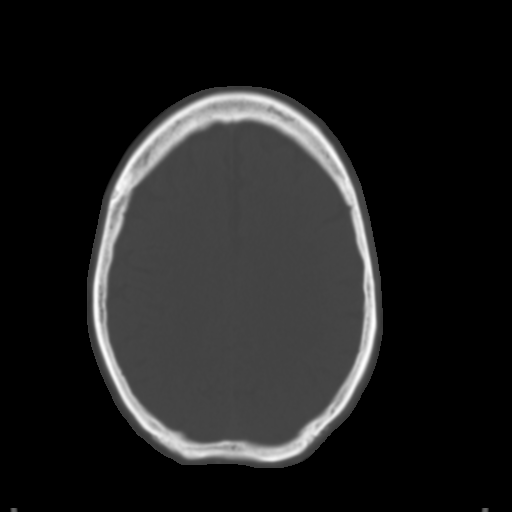
[im 25/34  brain]
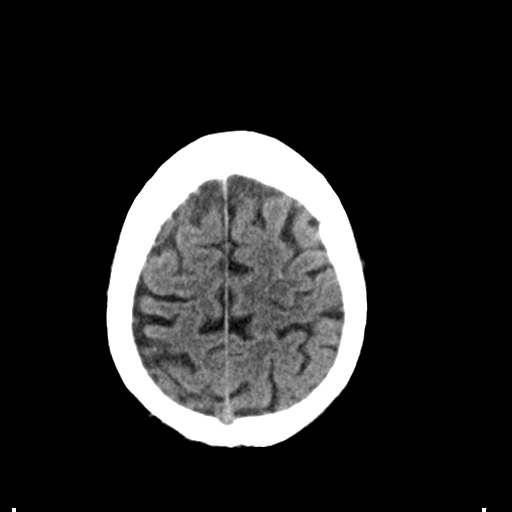
[im 29/34  brain]
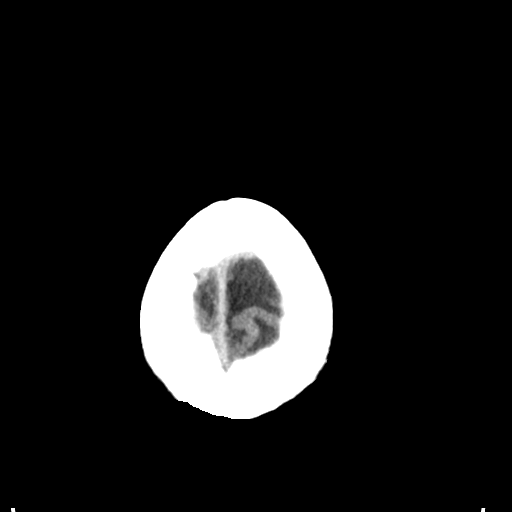

[Series 3: head bone · axial · 0.47mm/px · z∈[-128,-112]mm · 2 of 82 slices shown]
[im 9/82  bone]
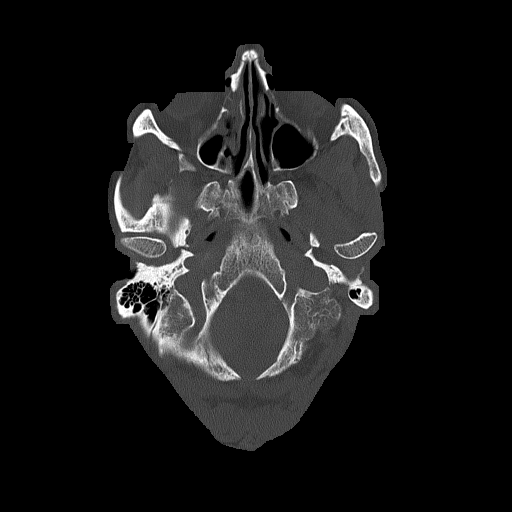
[im 17/82  bone]
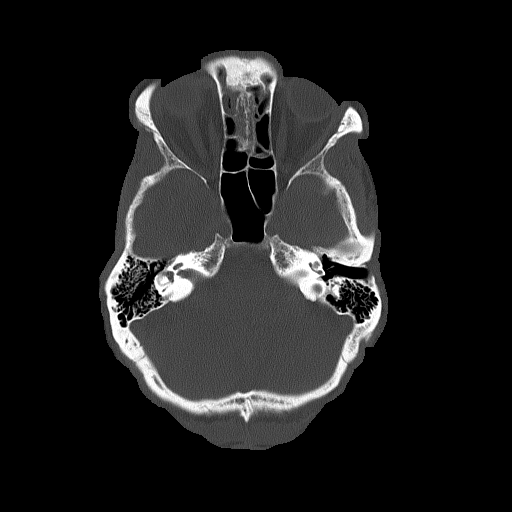

[Series 4: head without cor · coronal · non-contrast · 0.33mm/px · 3 of 74 slices shown]
[im 25/74  brain]
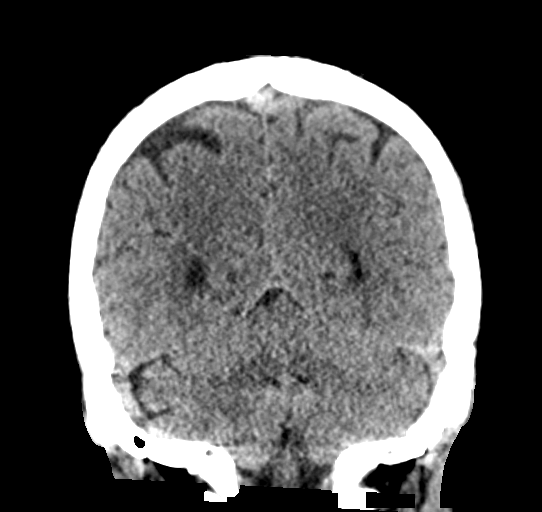
[im 33/74  brain]
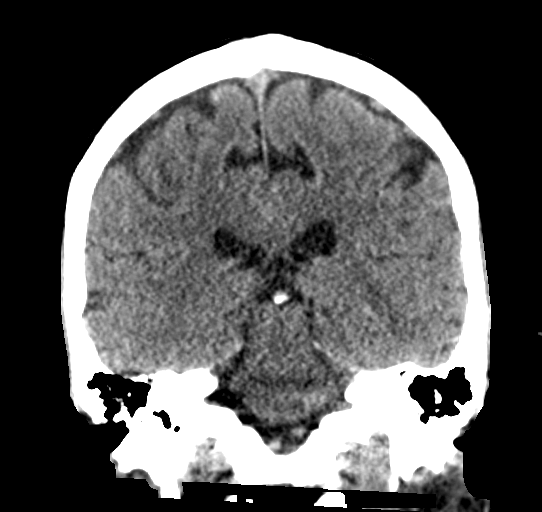
[im 41/74  brain]
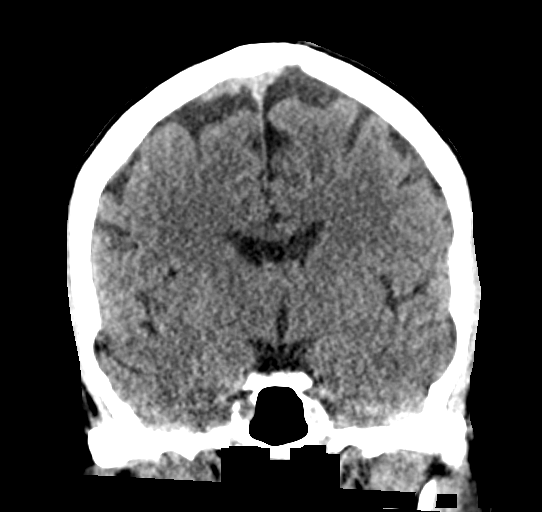

[Series 5: head without sag · sagittal · non-contrast · 0.33mm/px · 3 of 58 slices shown]
[im 20/58  brain]
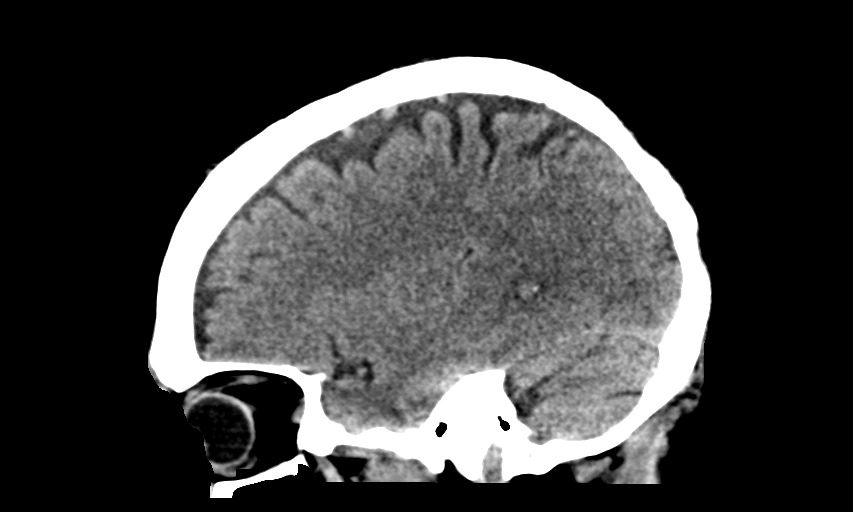
[im 29/58  brain]
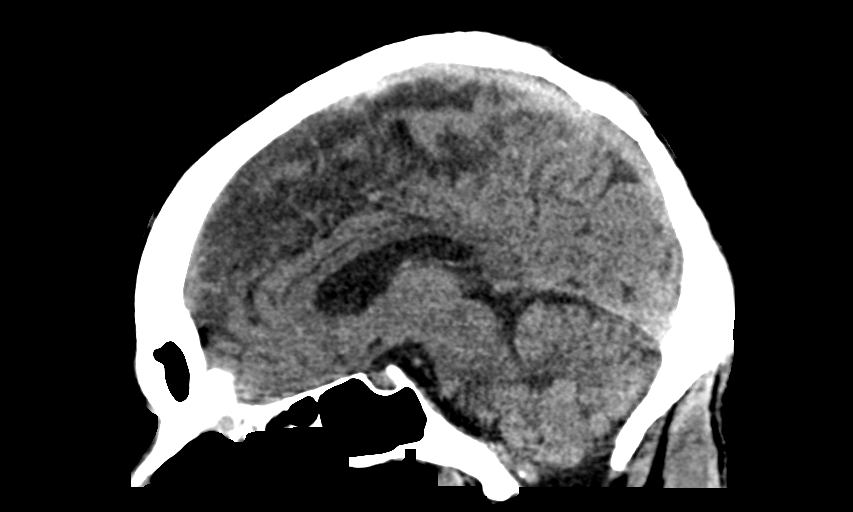
[im 39/58  brain]
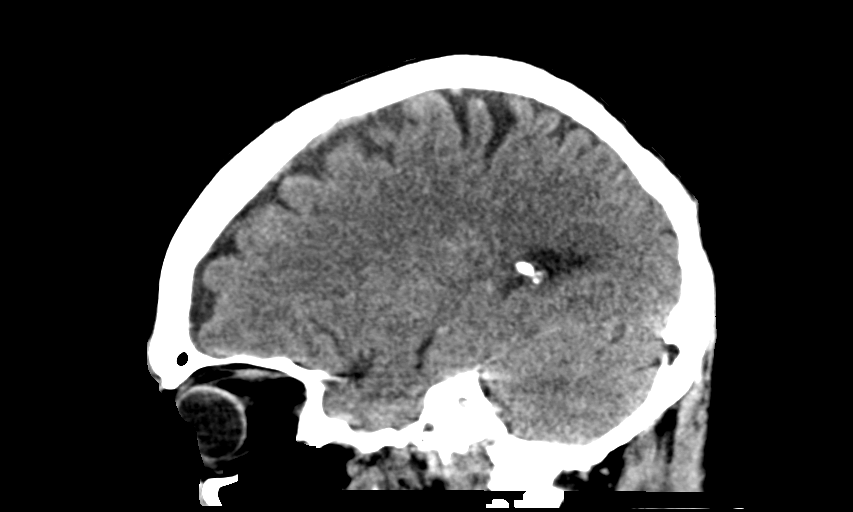

[15 of 47 positions shown; findings below may reference images not displayed]

FINDINGS: No evidence for acute infarction, hemorrhage, mass lesion,
hydrocephalus, or extra-axial fluid. Normal for age cerebral volume.
Minor hypoattenuation of white matter, likely chronic microvascular
ischemic change. Chronic LEFT caudate and periventricular white
matter infarct, MCA territory. No areas of cortical infarction.
Vascular calcification. No skull base or calvarial abnormalities.
Negative orbits. BILATERAL ethmoid sinus disease. No mastoid fluid.
IMPRESSION: Chronic LEFT caudate and deep white matter infarct. No acute
intracranial findings.

## 2017-06-08 ENCOUNTER — Ambulatory Visit (HOSPITAL_BASED_OUTPATIENT_CLINIC_OR_DEPARTMENT_OTHER): Payer: Medicare Other | Admitting: Hematology & Oncology

## 2017-06-08 ENCOUNTER — Ambulatory Visit (HOSPITAL_BASED_OUTPATIENT_CLINIC_OR_DEPARTMENT_OTHER)
Admission: RE | Admit: 2017-06-08 | Discharge: 2017-06-08 | Disposition: A | Discharge status: Home / Self Care | Payer: Medicare Other | Source: Ambulatory Visit | Attending: Hematology & Oncology | Admitting: Hematology & Oncology

## 2017-06-08 ENCOUNTER — Other Ambulatory Visit (HOSPITAL_BASED_OUTPATIENT_CLINIC_OR_DEPARTMENT_OTHER): Payer: Medicare Other

## 2017-06-08 VITALS — BP 131/61 | HR 88 | Temp 98.0°F | Resp 18 | Wt 168.0 lb

## 2017-06-08 DIAGNOSIS — D45 Polycythemia vera: Secondary | ICD-10-CM

## 2017-06-08 DIAGNOSIS — R748 Abnormal levels of other serum enzymes: Secondary | ICD-10-CM | POA: Diagnosis not present

## 2017-06-08 DIAGNOSIS — R161 Splenomegaly, not elsewhere classified: Secondary | ICD-10-CM

## 2017-06-08 LAB — CBC WITH DIFFERENTIAL (CANCER CENTER ONLY)
BASO#: 0.2 10*3/uL (ref 0.0–0.2)
BASO%: 0.7 % (ref 0.0–2.0)
EOS%: 0.9 % (ref 0.0–7.0)
Eosinophils Absolute: 0.2 10*3/uL (ref 0.0–0.5)
HCT: 47.3 % (ref 38.7–49.9)
HGB: 13.9 g/dL (ref 13.0–17.1)
LYMPH#: 1.2 10*3/uL (ref 0.9–3.3)
LYMPH%: 5.7 % — ABNORMAL LOW (ref 14.0–48.0)
MCH: 20.5 pg — ABNORMAL LOW (ref 28.0–33.4)
MCHC: 29.4 g/dL — AB (ref 32.0–35.9)
MCV: 70 fL — ABNORMAL LOW (ref 82–98)
MONO#: 0.7 10*3/uL (ref 0.1–0.9)
MONO%: 3 % (ref 0.0–13.0)
NEUT%: 89.7 % — AB (ref 40.0–80.0)
NEUTROS ABS: 19.5 10*3/uL — AB (ref 1.5–6.5)
Platelets: 321 10*3/uL (ref 145–400)
RBC: 6.79 10*6/uL — ABNORMAL HIGH (ref 4.20–5.70)
RDW: 22.3 % — AB (ref 11.1–15.7)
WBC: 21.8 10*3/uL — ABNORMAL HIGH (ref 4.0–10.0)

## 2017-06-08 LAB — CMP (CANCER CENTER ONLY)
ALBUMIN: 3.7 g/dL (ref 3.3–5.5)
ALT(SGPT): 46 U/L (ref 10–47)
AST: 52 U/L — AB (ref 11–38)
Alkaline Phosphatase: 926 U/L — ABNORMAL HIGH (ref 26–84)
BUN, Bld: 17 mg/dL (ref 7–22)
CALCIUM: 9.6 mg/dL (ref 8.0–10.3)
CHLORIDE: 105 meq/L (ref 98–108)
CO2: 31 meq/L (ref 18–33)
Creat: 0.9 mg/dl (ref 0.6–1.2)
GLUCOSE: 120 mg/dL — AB (ref 73–118)
Potassium: 4.7 mEq/L (ref 3.3–4.7)
Sodium: 145 mEq/L (ref 128–145)
Total Bilirubin: 1.2 mg/dl (ref 0.20–1.60)
Total Protein: 6.8 g/dL (ref 6.4–8.1)

## 2017-06-08 LAB — IRON AND TIBC
%SAT: 4 % — AB (ref 20–55)
IRON: 16 ug/dL — AB (ref 42–163)
TIBC: 375 ug/dL (ref 202–409)
UIBC: 359 ug/dL (ref 117–376)

## 2017-06-08 LAB — CHCC SATELLITE - SMEAR

## 2017-06-08 LAB — LACTATE DEHYDROGENASE: LDH: 682 U/L — ABNORMAL HIGH (ref 125–245)

## 2017-06-08 LAB — TECHNOLOGIST REVIEW CHCC SATELLITE

## 2017-06-08 LAB — FERRITIN: Ferritin: 17 ng/ml — ABNORMAL LOW (ref 22–316)
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
## 2017-06-14 ENCOUNTER — Ambulatory Visit (HOSPITAL_BASED_OUTPATIENT_CLINIC_OR_DEPARTMENT_OTHER): Payer: Medicare Other

## 2017-06-14 VITALS — BP 127/63 | HR 85 | Temp 97.9°F | Resp 18

## 2017-06-14 DIAGNOSIS — D45 Polycythemia vera: Secondary | ICD-10-CM

## 2017-06-14 NOTE — Patient Instructions (Signed)
Therapeutic Phlebotomy, Care After  Refer to this sheet in the next few weeks. These instructions provide you with information about caring for yourself after your procedure. Your health care provider may also give you more specific instructions. Your treatment has been planned according to current medical practices, but problems sometimes occur. Call your health care provider if you have any problems or questions after your procedure.  WHAT TO EXPECT AFTER THE PROCEDURE  After your procedure, it is common to have:   Light-headedness or dizziness. You may feel faint.   Nausea.   Tiredness.  HOME CARE INSTRUCTIONS  Activities   Return to your normal activities as directed by your health care provider. Most people can go back to their normal activities right away.   Avoid strenuous physical activity and heavy lifting or pulling for about 5 hours after the procedure. Do not lift anything that is heavier than 10 lb (4.5 kg).   Athletes should avoid strenuous exercise for at least 12 hours.   Change positions slowly for the remainder of the day. This will help to prevent light-headedness or fainting.   If you feel light-headed, lie down until the feeling goes away.  Eating and Drinking   Be sure to eat well-balanced meals for the next 24 hours.   Drink enough fluid to keep your urine clear or pale yellow.   Avoid drinking alcohol on the day that you had the procedure.  Care of the Needle Insertion Site   Keep your bandage dry. You can remove the bandage after about 5 hours or as directed by your health care provider.   If you have bleeding from the needle insertion site, elevate your arm and press firmly on the site until the bleeding stops.   If you have bruising at the site, apply ice to the area:   Put ice in a plastic bag.   Place a towel between your skin and the bag.   Leave the ice on for 20 minutes, 2-3 times a day for the first 24 hours.   If the swelling does not go away after 24 hours, apply  a warm, moist washcloth to the area for 20 minutes, 2-3 times a day.  General Instructions   Avoid smoking for at least 30 minutes after the procedure.   Keep all follow-up visits as directed by your health care provider. It is important to continue with further therapeutic phlebotomy treatments as directed.  SEEK MEDICAL CARE IF:   You have redness, swelling, or pain at the needle insertion site.   You have fluid, blood, or pus coming from the needle insertion site.   You feel light-headed, dizzy, or nauseated, and the feeling does not go away.   You notice new bruising at the needle insertion site.   You feel weaker than normal.   You have a fever or chills.  SEEK IMMEDIATE MEDICAL CARE IF:   You have severe nausea or vomiting.   You have chest pain.   You have trouble breathing.    This information is not intended to replace advice given to you by your health care provider. Make sure you discuss any questions you have with your health care provider.    Document Released: 01/12/2011 Document Revised: 12/25/2014 Document Reviewed: 08/06/2014  Elsevier Interactive Patient Education 2016 [employer]

## 2017-06-14 NOTE — Progress Notes (Signed)
Benjamin [name] presents today for phlebotomy per MD orders. Phlebotomy procedure started at 1415 and ended at 1420 with 576 grams removed via 16G to RAC. Patient observed for 30 minutes after procedure without any incident. Patient tolerated procedure well. Diet and nutrition offered.

## 2017-07-19 ENCOUNTER — Other Ambulatory Visit (HOSPITAL_BASED_OUTPATIENT_CLINIC_OR_DEPARTMENT_OTHER): Payer: Medicare Other

## 2017-07-19 ENCOUNTER — Other Ambulatory Visit: Payer: Self-pay

## 2017-07-19 ENCOUNTER — Encounter: Payer: Self-pay | Admitting: Hematology & Oncology

## 2017-07-19 ENCOUNTER — Ambulatory Visit: Payer: Medicare Other | Admitting: Hematology & Oncology

## 2017-07-19 VITALS — BP 132/72 | HR 75 | Temp 97.8°F | Resp 18 | Wt 174.0 lb

## 2017-07-19 DIAGNOSIS — E611 Iron deficiency: Secondary | ICD-10-CM | POA: Diagnosis not present

## 2017-07-19 DIAGNOSIS — R740 Nonspecific elevation of levels of transaminase and lactic acid dehydrogenase [LDH]: Secondary | ICD-10-CM | POA: Diagnosis not present

## 2017-07-19 DIAGNOSIS — D45 Polycythemia vera: Secondary | ICD-10-CM

## 2017-07-19 LAB — CBC WITH DIFFERENTIAL (CANCER CENTER ONLY)
BASO#: 0.2 10*3/uL (ref 0.0–0.2)
BASO%: 0.7 % (ref 0.0–2.0)
EOS%: 0.9 % (ref 0.0–7.0)
Eosinophils Absolute: 0.2 10*3/uL (ref 0.0–0.5)
HCT: 44.4 % (ref 38.7–49.9)
HEMOGLOBIN: 12.9 g/dL — AB (ref 13.0–17.1)
LYMPH#: 1.5 10*3/uL (ref 0.9–3.3)
LYMPH%: 6.5 % — ABNORMAL LOW (ref 14.0–48.0)
MCH: 20.1 pg — ABNORMAL LOW (ref 28.0–33.4)
MCHC: 29.1 g/dL — AB (ref 32.0–35.9)
MCV: 69 fL — ABNORMAL LOW (ref 82–98)
MONO#: 0.7 10*3/uL (ref 0.1–0.9)
MONO%: 3.1 % (ref 0.0–13.0)
NEUT%: 88.8 % — ABNORMAL HIGH (ref 40.0–80.0)
NEUTROS ABS: 20.8 10*3/uL — AB (ref 1.5–6.5)
RBC: 6.41 10*6/uL — ABNORMAL HIGH (ref 4.20–5.70)
RDW: 22.1 % — AB (ref 11.1–15.7)
WBC: 23.5 10*3/uL — ABNORMAL HIGH (ref 4.0–10.0)

## 2017-07-19 LAB — CMP (CANCER CENTER ONLY)
ALK PHOS: 870 U/L — AB (ref 26–84)
ALT: 50 U/L — AB (ref 10–47)
AST: 40 U/L — ABNORMAL HIGH (ref 11–38)
Albumin: 3.7 g/dL (ref 3.3–5.5)
BUN, Bld: 13 mg/dL (ref 7–22)
CO2: 30 mEq/L (ref 18–33)
Calcium: 9.1 mg/dL (ref 8.0–10.3)
Chloride: 103 mEq/L (ref 98–108)
Creat: 0.9 mg/dl (ref 0.6–1.2)
GLUCOSE: 96 mg/dL (ref 73–118)
Potassium: 4.6 mEq/L (ref 3.3–4.7)
Sodium: 141 mEq/L (ref 128–145)
Total Bilirubin: 1.2 mg/dl (ref 0.20–1.60)
Total Protein: 6.3 g/dL — ABNORMAL LOW (ref 6.4–8.1)

## 2017-07-19 LAB — TECHNOLOGIST REVIEW CHCC SATELLITE

## 2017-07-19 NOTE — Progress Notes (Signed)
Hematology and Oncology Follow Up Visit  Benjamin [name] [id number] May 05, 1951 66 y.o. 07/19/2017   Principle Diagnosis:  Polycythemia vera -- JAK2 positive. Perforated gastric ulcer-H pylori positive   Current Therapy:   Phlebotomy to maintained Hct < 45% EC aspirin 81 mg PO daily    Interim History:  Benjamin [name] is here today for follow-up.  Is doing well.  He had a very good Thanksgiving.  He went to [place].  He has a lot of family in [place].  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  He has had no problems with bleeding.  He has had no headache.  He has had no pain in his hands or feet.  He has had no rashes.  There is been no cough or shortness of breath.  We definitely have made him iron deficient.  In October, his ferritin was 17 with iron saturation of only 4%.  We have been watching his alkaline phosphatase.  This has been coming down gradually.  Overall, his performance status is ECOG 0   Medications:  Allergies as of 07/19/2017      Reactions   Cephalexin Hives, Rash   Biaxin [clarithromycin]    Joint pains   Flagyl [metronidazole] Other (See Comments)   Joint pain   Morphine And Related Hives      Medication List        Accurate as of 07/19/17  4:55 PM. Always use your most recent med list.          aspirin 81 MG tablet Take 81 mg by mouth every morning.   B-complex with vitamin C tablet Take 1 tablet by mouth daily.   cetirizine 10 MG tablet Commonly known as:  ZYRTEC Take 10 mg by mouth daily as needed for allergies.   FINACEA 15 % Foam Generic drug:  Azelaic Acid   Fish Oil 1000 MG Caps Take 2 capsules by mouth daily.   Magnesium 400 MG Caps Take 1 capsule by mouth daily.   Vitamin D 2000 units Caps Take by mouth.   Vitamin E 400 units Tabs Take 1 tablet by mouth daily.       Allergies:  Allergies  Allergen Reactions  . Cephalexin Hives and Rash  . Biaxin  [Clarithromycin]     Joint pains  . Flagyl [Metronidazole] Other (See Comments)    Joint pain  . Morphine And Related Hives    Past Medical History, Surgical history, Social history, and Family History were reviewed and updated.  Review of Systems:  As stated in the interim history  Physical Exam:  weight is 174 lb (78.9 kg). His oral temperature is 97.8 F (36.6 C). His blood pressure is 132/72 and his pulse is 75. His respiration is 18 and oxygen saturation is 100%.   Wt Readings from Last 3 Encounters:  07/19/17 174 lb (78.9 kg)  06/08/17 168 lb (76.2 kg)  04/28/17 167 lb (75.8 kg)    Well-developed well-nourished white male in no obvious distress.  Head neck exam shows no ocular or oral lesions.  He has no scleral icterus.  There is no conjunctival inflammation.  He has no adenopathy in the neck.  Lungs are clear bilaterally.  Cardiac exam regular rate and rhythm with no murmurs, rubs or bruits.  Abdomen is soft.  He has good bowel sounds.  He has no fluid wave.  He has well-healed laparoscopy scars.  There is  no palpable hepatomegaly.  His spleen tip is palpable just below the left costal margin.  Back exam shows no tenderness over the spine, ribs or hips.  Extremities shows no clubbing, cyanosis or edema.  Neurological exam shows no focal neurological deficits.  Skin exam shows no rashes, ecchymoses or petechia.  He has no erythema on the palms of his hand.  Lab Results  Component Value Date   WBC 23.5 (H) 07/19/2017   HGB 12.9 (L) 07/19/2017   HCT 44.4 07/19/2017   MCV 69 (L) 07/19/2017   PLT 320 Large & giant platelets 07/19/2017   Lab Results  Component Value Date   FERRITIN 17 (L) 06/08/2017   IRON 16 (L) 06/08/2017   TIBC 375 06/08/2017   UIBC 359 06/08/2017   IRONPCTSAT 4 (L) 06/08/2017   Lab Results  Component Value Date   RETICCTPCT 2.7 (H) 02/27/2015   RBC 6.41 (H) 07/19/2017   RETICCTABS 173.6 02/27/2015   No results found for: KPAFRELGTCHN, LAMBDASER,  KAPLAMBRATIO No results found for: IGGSERUM, IGA, IGMSERUM No results found for: Kathrynn Ducking, MSPIKE, SPEI   Chemistry      Component Value Date/Time   NA 141 07/19/2017 1417   NA 141 05/14/2016 1202   K 4.6 07/19/2017 1417   K 4.8 05/14/2016 1202   CL 103 07/19/2017 1417   CO2 30 07/19/2017 1417   CO2 26 05/14/2016 1202   BUN 13 07/19/2017 1417   BUN 14.0 05/14/2016 1202   CREATININE 0.9 07/19/2017 1417   CREATININE 1.0 05/14/2016 1202      Component Value Date/Time   CALCIUM 9.1 07/19/2017 1417   CALCIUM 9.2 05/14/2016 1202   ALKPHOS 870 (H) 07/19/2017 1417   ALKPHOS 913 (H) 05/14/2016 1202   AST 40 (H) 07/19/2017 1417   AST 39 (H) 05/14/2016 1202   ALT 50 (H) 07/19/2017 1417   ALT 41 05/14/2016 1202   BILITOT 1.20 07/19/2017 1417   BILITOT 1.00 05/14/2016 1202      Impression and Plan: Benjamin [name] is a very pleasant 66 yo caucasian gentleman with polycythemia vera, JAK2 positive.   Thankfully, we can hold off on phlebotomizing him.  I am just happy that he is done well.  The fact that he is iron deficient I think has helped.  His alkaline phosphatase is coming down gradually.  He does have an elevated LDH however.  I think we can probably get him back next year.  I think we can get him through the holidays without having to have him come back.  I would like to get an ultrasound of the spleen when we see him back.  I would like to see if this is changed in size.  If we find that his spleen is increasing in size, this might be the indication that we need to switch him over to [facility], or possibly [place].     Benjamin Napoleon, MD 11/26/20184:55 PM

## 2017-07-20 LAB — LACTATE DEHYDROGENASE: LDH: 760 U/L — AB (ref 125–245)

## 2017-09-08 ENCOUNTER — Inpatient Hospital Stay: Payer: Medicare Other | Attending: Hematology & Oncology | Admitting: Hematology & Oncology

## 2017-09-08 ENCOUNTER — Other Ambulatory Visit: Payer: Self-pay

## 2017-09-08 ENCOUNTER — Inpatient Hospital Stay: Payer: Medicare Other

## 2017-09-08 ENCOUNTER — Telehealth: Payer: Self-pay | Admitting: *Deleted

## 2017-09-08 ENCOUNTER — Ambulatory Visit (HOSPITAL_BASED_OUTPATIENT_CLINIC_OR_DEPARTMENT_OTHER)
Admission: RE | Admit: 2017-09-08 | Discharge: 2017-09-08 | Disposition: A | Discharge status: Home / Self Care | Payer: Medicare Other | Source: Ambulatory Visit | Attending: Hematology & Oncology | Admitting: Hematology & Oncology

## 2017-09-08 ENCOUNTER — Encounter: Payer: Self-pay | Admitting: Hematology & Oncology

## 2017-09-08 VITALS — BP 136/66 | HR 71 | Temp 97.5°F | Resp 17 | Wt 174.8 lb

## 2017-09-08 DIAGNOSIS — R161 Splenomegaly, not elsewhere classified: Secondary | ICD-10-CM | POA: Insufficient documentation

## 2017-09-08 DIAGNOSIS — D45 Polycythemia vera: Secondary | ICD-10-CM | POA: Insufficient documentation

## 2017-09-08 LAB — CMP (CANCER CENTER ONLY)
ALT: 41 U/L (ref 0–55)
ANION GAP: 9 (ref 3–11)
AST: 45 U/L — ABNORMAL HIGH (ref 5–34)
Albumin: 4.3 g/dL (ref 3.5–5.0)
Alkaline Phosphatase: 1090 U/L — ABNORMAL HIGH (ref 40–150)
BILIRUBIN TOTAL: 1.1 mg/dL (ref 0.2–1.2)
BUN: 16 mg/dL (ref 7–26)
CALCIUM: 9.2 mg/dL (ref 8.4–10.4)
CO2: 25 mmol/L (ref 22–29)
Chloride: 106 mmol/L (ref 98–109)
Creatinine: 0.98 mg/dL (ref 0.70–1.30)
GFR, Est AFR Am: >60 mL/min (ref >60)
Glucose, Bld: 96 mg/dL (ref 70–140)
POTASSIUM: 4.5 mmol/L (ref 3.5–5.1)
Sodium: 140 mmol/L (ref 136–145)
Total Protein: 6.9 g/dL (ref 6.4–8.3)

## 2017-09-08 LAB — FERRITIN: Ferritin: 27 ng/mL (ref 22–316)

## 2017-09-08 LAB — CBC WITH DIFFERENTIAL (CANCER CENTER ONLY)
Basophils Absolute: 0.2 10*3/uL — ABNORMAL HIGH (ref 0.0–0.1)
Basophils Relative: 1 %
Eosinophils Absolute: 0.2 10*3/uL (ref 0.0–0.5)
Eosinophils Relative: 1 %
HCT: 45.3 % (ref 38.7–49.9)
Hemoglobin: 13 g/dL (ref 13.0–17.1)
Lymphocytes Relative: 6 %
Lymphs Abs: 1.3 10*3/uL (ref 0.9–3.3)
MCH: 20.8 pg — ABNORMAL LOW (ref 28.0–33.4)
MCHC: 28.7 g/dL — ABNORMAL LOW (ref 32.0–35.9)
MCV: 72.6 fL — ABNORMAL LOW (ref 82.0–98.0)
Monocytes Absolute: 0.7 10*3/uL (ref 0.1–0.9)
Monocytes Relative: 3 %
Neutro Abs: 19.5 10*3/uL — ABNORMAL HIGH (ref 1.5–6.5)
Neutrophils Relative %: 89 %
Platelet Count: 297 10*3/uL (ref 140–400)
RBC: 6.24 MIL/uL — ABNORMAL HIGH (ref 4.20–5.70)
RDW: 23.2 % — ABNORMAL HIGH (ref 11.1–15.7)
WBC Count: 21.9 10*3/uL — ABNORMAL HIGH (ref 4.0–10.3)

## 2017-09-08 LAB — IRON AND TIBC
Iron: 19 ug/dL — ABNORMAL LOW (ref 42–163)
Saturation Ratios: 5 % — ABNORMAL LOW (ref 42–163)
TIBC: 383 ug/dL (ref 202–409)
UIBC: 363 ug/dL

## 2017-09-08 LAB — TSH: TSH: 3.675 u[IU]/mL (ref 0.320–4.118)

## 2017-09-08 LAB — LACTATE DEHYDROGENASE: LDH: 831 U/L — AB (ref 125–245)

## 2017-09-08 NOTE — Progress Notes (Signed)
Hematology and Oncology Follow Up Visit  Benjamin [name] [id number] [birth year] 67 y.o. 09/08/2017   Principle Diagnosis:  Polycythemia vera -- JAK2 positive. Perforated gastric ulcer-H pylori positive   Current Therapy:   Phlebotomy to maintain a Hct < 45% EC aspirin 81 mg PO daily    Interim History:  Benjamin [name] is here today for follow-up.  He is doing well.  He had no problems over the Christmas and New Year's holiday.  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  He had ultrasound of the spleen today.  Unfortunately, we do not have these results back yet.  He has had no problems with fever.  He has had no cough or shortness of breath.  He has had no change in bowel or bladder habits.  There is been no bleeding.  We have made him iron deficient.  Back in October, his ferritin was 17 with iron saturation of 4%.  Overall, his performance status is ECOG 0   Medications:  Allergies as of 09/08/2017      Reactions   Cephalexin Hives, Rash   Biaxin [clarithromycin]    Joint pains   Flagyl [metronidazole] Other (See Comments)   Joint pain   Morphine And Related Hives      Medication List        Accurate as of 09/08/17 10:13 AM. Always use your most recent med list.          aspirin 81 MG tablet Take 81 mg by mouth every morning.   B-complex with vitamin C tablet Take 1 tablet by mouth daily.   cetirizine 10 MG tablet Commonly known as:  ZYRTEC Take 10 mg by mouth daily as needed for allergies.   FINACEA 15 % Foam Generic drug:  Azelaic Acid   Fish Oil 1000 MG Caps Take 2 capsules by mouth daily.   Magnesium 400 MG Caps Take 1 capsule by mouth daily.   Vitamin D 2000 units Caps Take by mouth.   Vitamin E 400 units Tabs Take 1 tablet by mouth daily.       Allergies:  Allergies  Allergen Reactions  . Cephalexin Hives and Rash  . Biaxin [Clarithromycin]     Joint pains  . Flagyl [Metronidazole] Other (See Comments)    Joint pain  .  Morphine And Related Hives    Past Medical History, Surgical history, Social history, and Family History were reviewed and updated.  Review of Systems:  Review of Systems  Constitutional: Negative.   HENT: Negative.   Eyes: Negative.   Respiratory: Negative.   Cardiovascular: Negative.   Gastrointestinal: Negative.   Genitourinary: Negative.   Musculoskeletal: Negative.   Skin: Negative.   Neurological: Negative.   Endo/Heme/Allergies: Negative.   Psychiatric/Behavioral: Negative.     Physical Exam:  weight is 174 lb 12.8 oz (79.3 kg). His oral temperature is 97.5 F (36.4 C) (abnormal). His blood pressure is 136/66 and his pulse is 71. His respiration is 17 and oxygen saturation is 100%.   Wt Readings from Last 3 Encounters:  09/08/17 174 lb 12.8 oz (79.3 kg)  07/19/17 174 lb (78.9 kg)  06/08/17 168 lb (76.2 kg)    Physical Exam  Constitutional: He is oriented to person, place, and time.  HENT:  Head: Normocephalic and atraumatic.  Mouth/Throat: Oropharynx is clear and moist.  Eyes: EOM are normal. Pupils are equal, round, and reactive to light.  Neck: Normal range of motion.  Cardiovascular: Normal rate, regular rhythm and  normal heart sounds.  Pulmonary/Chest: Effort normal and breath sounds normal.  Abdominal: Soft. Bowel sounds are normal.  Musculoskeletal: Normal range of motion. He exhibits no edema, tenderness or deformity.  Lymphadenopathy:    He has no cervical adenopathy.  Neurological: He is alert and oriented to person, place, and time.  Skin: Skin is warm and dry. No rash noted. No erythema.  Psychiatric: He has a normal mood and affect. His behavior is normal. Judgment and thought content normal.  Vitals reviewed.    Lab Results  Component Value Date   WBC 23.5 (H) 07/19/2017   HGB 12.9 (L) 07/19/2017   HCT 45.3 09/08/2017   MCV 72.6 (L) 09/08/2017   PLT 320 Large & giant platelets 07/19/2017   Lab Results  Component Value Date   FERRITIN  17 (L) 06/08/2017   IRON 16 (L) 06/08/2017   TIBC 375 06/08/2017   UIBC 359 06/08/2017   IRONPCTSAT 4 (L) 06/08/2017   Lab Results  Component Value Date   RETICCTPCT 2.7 (H) 02/27/2015   RBC 6.24 (H) 09/08/2017   RETICCTABS 173.6 02/27/2015   No results found for: KPAFRELGTCHN, LAMBDASER, KAPLAMBRATIO No results found for: IGGSERUM, IGA, IGMSERUM No results found for: Kathrynn Ducking, MSPIKE, SPEI   Chemistry      Component Value Date/Time   NA 141 07/19/2017 1417   NA 141 05/14/2016 1202   K 4.6 07/19/2017 1417   K 4.8 05/14/2016 1202   CL 103 07/19/2017 1417   CO2 30 07/19/2017 1417   CO2 26 05/14/2016 1202   BUN 13 07/19/2017 1417   BUN 14.0 05/14/2016 1202   CREATININE 0.9 07/19/2017 1417   CREATININE 1.0 05/14/2016 1202      Component Value Date/Time   CALCIUM 9.1 07/19/2017 1417   CALCIUM 9.2 05/14/2016 1202   ALKPHOS 870 (H) 07/19/2017 1417   ALKPHOS 913 (H) 05/14/2016 1202   AST 40 (H) 07/19/2017 1417   AST 39 (H) 05/14/2016 1202   ALT 50 (H) 07/19/2017 1417   ALT 41 05/14/2016 1202   BILITOT 1.20 07/19/2017 1417   BILITOT 1.00 05/14/2016 1202      Impression and Plan: Benjamin [name] is a very pleasant 67 yo caucasian gentleman with polycythemia vera, JAK2 positive.   He needs to be phlebotomized.  However, he cannot do it today.  We will set this up for next week.  We will have to see with the rest of his labs are.  We will have to see what his ultrasound of the spleen shows.    I will plan to get him back here in 2 months.  Benjamin Napoleon, MD 1/16/201910:13 AM

## 2017-09-08 NOTE — Telephone Encounter (Addendum)
Patient is aware of results ----- Message from Volanda Napoleon, MD sent at 09/08/2017  1:00 PM EST ----- Call - thyroid level is normal!!!  Dierdre Searles, MD  P Onc Nurse [facility]        Call - spleen is stable in size! It is not getting larger!! Laurey Arrow

## 2017-09-13 ENCOUNTER — Inpatient Hospital Stay: Payer: Medicare Other

## 2017-09-13 VITALS — BP 128/57 | HR 68 | Temp 97.8°F | Resp 20

## 2017-09-13 DIAGNOSIS — D45 Polycythemia vera: Secondary | ICD-10-CM

## 2017-09-13 NOTE — Progress Notes (Signed)
Benjamin [name] presents today for phlebotomy per MD orders. Phlebotomy procedure started at 1428 and ended at 1433. 533 grams removed from right ac via phlebotomy kit without difficulty.  Patient observed for 30 minutes after procedure without any incident and took snack and drink after phlebotomy.  Patient tolerated procedure well.   IV needle removed intact.  Pressure dressing clean, dry and intact to right ac at time of discharge.

## 2017-11-04 ENCOUNTER — Inpatient Hospital Stay: Payer: Medicare Other

## 2017-11-04 ENCOUNTER — Inpatient Hospital Stay: Payer: Medicare Other | Attending: Hematology & Oncology | Admitting: Hematology & Oncology

## 2017-11-04 VITALS — BP 140/70 | HR 83 | Temp 98.1°F | Resp 17 | Wt 170.8 lb

## 2017-11-04 DIAGNOSIS — K255 Chronic or unspecified gastric ulcer with perforation: Secondary | ICD-10-CM | POA: Diagnosis not present

## 2017-11-04 DIAGNOSIS — Z7982 Long term (current) use of aspirin: Secondary | ICD-10-CM | POA: Insufficient documentation

## 2017-11-04 DIAGNOSIS — B9681 Helicobacter pylori [H. pylori] as the cause of diseases classified elsewhere: Secondary | ICD-10-CM | POA: Insufficient documentation

## 2017-11-04 DIAGNOSIS — D45 Polycythemia vera: Secondary | ICD-10-CM | POA: Insufficient documentation

## 2017-11-04 LAB — CMP (CANCER CENTER ONLY)
ALBUMIN: 3.8 g/dL (ref 3.5–5.0)
ALT: 40 U/L (ref 10–47)
AST: 43 U/L — ABNORMAL HIGH (ref 11–38)
Alkaline Phosphatase: 954 U/L — ABNORMAL HIGH (ref 26–84)
Anion gap: 8 (ref 5–15)
BILIRUBIN TOTAL: 1.2 mg/dL (ref 0.2–1.6)
BUN: 23 mg/dL — ABNORMAL HIGH (ref 7–22)
CHLORIDE: 104 mmol/L (ref 98–108)
CO2: 31 mmol/L (ref 18–33)
CREATININE: 1.3 mg/dL — AB (ref 0.60–1.20)
Calcium: 9 mg/dL (ref 8.0–10.3)
Glucose, Bld: 116 mg/dL (ref 73–118)
Potassium: 4.9 mmol/L — ABNORMAL HIGH (ref 3.3–4.7)
SODIUM: 143 mmol/L (ref 128–145)
Total Protein: 6.4 g/dL (ref 6.4–8.1)

## 2017-11-04 LAB — CBC WITH DIFFERENTIAL (CANCER CENTER ONLY)
Basophils Absolute: 0.2 10*3/uL — ABNORMAL HIGH (ref 0.0–0.1)
Basophils Relative: 1 %
EOS ABS: 0.3 10*3/uL (ref 0.0–0.5)
Eosinophils Relative: 1 %
HEMATOCRIT: 44.7 % (ref 38.7–49.9)
HEMOGLOBIN: 13.3 g/dL (ref 13.0–17.1)
Lymphocytes Relative: 5 %
Lymphs Abs: 1.3 10*3/uL (ref 0.9–3.3)
MCH: 21.1 pg — ABNORMAL LOW (ref 28.0–33.4)
MCHC: 29.8 g/dL — AB (ref 32.0–35.9)
MCV: 71 fL — ABNORMAL LOW (ref 82.0–98.0)
MONOS PCT: 3 %
Monocytes Absolute: 0.9 10*3/uL (ref 0.1–0.9)
NEUTROS PCT: 90 %
Neutro Abs: 25.3 10*3/uL — ABNORMAL HIGH (ref 1.5–6.5)
Platelet Count: 328 10*3/uL (ref 145–400)
RBC: 6.3 MIL/uL — ABNORMAL HIGH (ref 4.20–5.70)
RDW: 22.1 % — ABNORMAL HIGH (ref 11.1–15.7)
WBC Count: 28 10*3/uL — ABNORMAL HIGH (ref 4.0–10.0)

## 2017-11-04 NOTE — Progress Notes (Signed)
Hematology and Oncology Follow Up Visit  Benjamin [name] [id number] 11-05-1950 67 y.o. 11/04/2017   Principle Diagnosis:  Polycythemia vera -- JAK2 positive. Perforated gastric ulcer-H pylori positive   Current Therapy:   Phlebotomy to maintain a Hct < 45% EC aspirin 81 mg PO daily    Interim History:  Benjamin [name] is here today for follow-up.  He is doing well.  It sounds like he is going to need surgery for the fifth finger on his left hand.  He has a contracture.  This might be done in about 2 weeks or so.  From my point of view, I do not see a problem with him having his surgery.  His elevated white cell count is from his underlying disease and not from an infection.  There should be no problems with him healing.  His iron levels are on the low side.  This is what we would like to see given that his phlebotomies.  He is still playing in a band.  He will be busy over the next week or so.  He has had no fever.  He has had no rashes.  Is had no nausea or vomiting.  He has had a swollen left foot.  He twisted his foot.  Is getting better he says.  Overall, we will hold off on phlebotomizing him today.  I will see him back in 6 weeks.  By then, I think he would have had his his performance status is ECOG 0   Medications:  Allergies as of 11/04/2017      Reactions   Cephalexin Hives, Rash   Biaxin [clarithromycin]    Joint pains   Flagyl [metronidazole] Other (See Comments)   Joint pain   Morphine And Related Hives      Medication List        Accurate as of 11/04/17  3:03 PM. Always use your most recent med list.          aspirin 81 MG tablet Take 81 mg by mouth every morning.   B-complex with vitamin C tablet Take 1 tablet by mouth daily.   cetirizine 10 MG tablet Commonly known as:  ZYRTEC Take 10 mg by mouth daily as needed for allergies.   FINACEA 15 % Foam Generic drug:  Azelaic Acid   Fish Oil 1000 MG Caps Take 2 capsules by mouth daily.     Magnesium 400 MG Caps Take 1 capsule by mouth daily.   Vitamin D 2000 units Caps Take by mouth.   Vitamin E 400 units Tabs Take 1 tablet by mouth daily.       Allergies:  Allergies  Allergen Reactions  . Cephalexin Hives and Rash  . Biaxin [Clarithromycin]     Joint pains  . Flagyl [Metronidazole] Other (See Comments)    Joint pain  . Morphine And Related Hives    Past Medical History, Surgical history, Social history, and Family History were reviewed and updated.  Review of Systems:  Review of Systems  Constitutional: Negative.   HENT: Negative.   Eyes: Negative.   Respiratory: Negative.   Cardiovascular: Negative.   Gastrointestinal: Negative.   Genitourinary: Negative.   Musculoskeletal: Negative.   Skin: Negative.   Neurological: Negative.   Endo/Heme/Allergies: Negative.   Psychiatric/Behavioral: Negative.     Physical Exam:  weight is 170 lb 12 oz (77.5 kg). His oral temperature is 98.1 F (36.7 C). His blood pressure is 140/70 and his pulse is 83. His respiration is 17  and oxygen saturation is 100%.   Wt Readings from Last 3 Encounters:  11/04/17 170 lb 12 oz (77.5 kg)  09/08/17 174 lb 12.8 oz (79.3 kg)  07/19/17 174 lb (78.9 kg)    Physical Exam  Constitutional: He is oriented to person, place, and time.  HENT:  Head: Normocephalic and atraumatic.  Mouth/Throat: Oropharynx is clear and moist.  Eyes: EOM are normal. Pupils are equal, round, and reactive to light.  Neck: Normal range of motion.  Cardiovascular: Normal rate, regular rhythm and normal heart sounds.  Pulmonary/Chest: Effort normal and breath sounds normal.  Abdominal: Soft. Bowel sounds are normal.  Musculoskeletal: Normal range of motion. He exhibits no edema, tenderness or deformity.  Lymphadenopathy:    He has no cervical adenopathy.  Neurological: He is alert and oriented to person, place, and time.  Skin: Skin is warm and dry. No rash noted. No erythema.  Psychiatric: He  has a normal mood and affect. His behavior is normal. Judgment and thought content normal.  Vitals reviewed.    Lab Results  Component Value Date   WBC 28.0 (H) 11/04/2017   HGB 12.9 (L) 07/19/2017   HCT 44.7 11/04/2017   MCV 71.0 (L) 11/04/2017   PLT 328 11/04/2017   Lab Results  Component Value Date   FERRITIN 27 09/08/2017   IRON 19 (L) 09/08/2017   TIBC 383 09/08/2017   UIBC 363 09/08/2017   IRONPCTSAT 5 (L) 09/08/2017   Lab Results  Component Value Date   RETICCTPCT 2.7 (H) 02/27/2015   RBC 6.30 (H) 11/04/2017   RETICCTABS 173.6 02/27/2015   No results found for: KPAFRELGTCHN, LAMBDASER, KAPLAMBRATIO No results found for: IGGSERUM, IGA, IGMSERUM No results found for: Odetta Pink, SPEI   Chemistry      Component Value Date/Time   NA 143 11/04/2017 1407   NA 141 07/19/2017 1417   NA 141 05/14/2016 1202   K 4.9 (H) 11/04/2017 1407   K 4.6 07/19/2017 1417   K 4.8 05/14/2016 1202   CL 104 11/04/2017 1407   CL 103 07/19/2017 1417   CO2 31 11/04/2017 1407   CO2 30 07/19/2017 1417   CO2 26 05/14/2016 1202   BUN 23 (H) 11/04/2017 1407   BUN 13 07/19/2017 1417   BUN 14.0 05/14/2016 1202   CREATININE 1.30 (H) 11/04/2017 1407   CREATININE 0.9 07/19/2017 1417   CREATININE 1.0 05/14/2016 1202      Component Value Date/Time   CALCIUM 9.0 11/04/2017 1407   CALCIUM 9.1 07/19/2017 1417   CALCIUM 9.2 05/14/2016 1202   ALKPHOS 954 (H) 11/04/2017 1407   ALKPHOS 870 (H) 07/19/2017 1417   ALKPHOS 913 (H) 05/14/2016 1202   AST 43 (H) 11/04/2017 1407   AST 39 (H) 05/14/2016 1202   ALT 40 11/04/2017 1407   ALT 50 (H) 07/19/2017 1417   ALT 41 05/14/2016 1202   BILITOT 1.2 11/04/2017 1407   BILITOT 1.00 05/14/2016 1202      Impression and Plan: Benjamin [name] is a very pleasant 67 yo caucasian gentleman with polycythemia vera, JAK2 positive.   We will go ahead and get him back in 6 weeks.  By then, he would have had  his surgery.  Volanda Napoleon, MD 3/14/20193:03 PM

## 2017-11-05 LAB — LACTATE DEHYDROGENASE: LDH: 775 U/L — AB (ref 125–245)

## 2017-11-05 LAB — IRON AND TIBC
Iron: 24 ug/dL — ABNORMAL LOW (ref 42–163)
Saturation Ratios: 7 % — ABNORMAL LOW (ref 42–163)
TIBC: 368 ug/dL (ref 202–409)
UIBC: 343 ug/dL

## 2017-11-05 LAB — FERRITIN: FERRITIN: 20 ng/mL — AB (ref 22–316)

## 2017-11-30 ENCOUNTER — Other Ambulatory Visit: Payer: Self-pay | Admitting: Orthopedic Surgery

## 2017-12-15 ENCOUNTER — Inpatient Hospital Stay: Payer: Medicare Other | Attending: Hematology & Oncology | Admitting: Hematology & Oncology

## 2017-12-15 ENCOUNTER — Inpatient Hospital Stay: Payer: Medicare Other

## 2018-01-04 ENCOUNTER — Inpatient Hospital Stay: Payer: Medicare Other | Attending: Hematology & Oncology

## 2018-01-04 ENCOUNTER — Inpatient Hospital Stay: Payer: Medicare Other

## 2018-01-04 ENCOUNTER — Inpatient Hospital Stay (HOSPITAL_BASED_OUTPATIENT_CLINIC_OR_DEPARTMENT_OTHER): Payer: Medicare Other | Admitting: Family

## 2018-01-04 ENCOUNTER — Other Ambulatory Visit: Payer: Self-pay | Admitting: Family

## 2018-01-04 ENCOUNTER — Other Ambulatory Visit: Payer: Self-pay

## 2018-01-04 VITALS — BP 139/70 | HR 71 | Temp 98.1°F | Resp 18 | Wt 170.5 lb

## 2018-01-04 VITALS — BP 134/63 | HR 73 | Temp 100.0°F | Resp 18

## 2018-01-04 DIAGNOSIS — B9681 Helicobacter pylori [H. pylori] as the cause of diseases classified elsewhere: Secondary | ICD-10-CM

## 2018-01-04 DIAGNOSIS — Z7982 Long term (current) use of aspirin: Secondary | ICD-10-CM | POA: Diagnosis not present

## 2018-01-04 DIAGNOSIS — D45 Polycythemia vera: Secondary | ICD-10-CM | POA: Insufficient documentation

## 2018-01-04 DIAGNOSIS — K255 Chronic or unspecified gastric ulcer with perforation: Secondary | ICD-10-CM

## 2018-01-04 DIAGNOSIS — D500 Iron deficiency anemia secondary to blood loss (chronic): Secondary | ICD-10-CM

## 2018-01-04 LAB — CMP (CANCER CENTER ONLY)
ALT: 34 U/L (ref 0–55)
AST: 58 U/L — AB (ref 5–34)
Albumin: 4.4 g/dL (ref 3.5–5.0)
Alkaline Phosphatase: 1110 U/L — ABNORMAL HIGH (ref 40–150)
Anion gap: 9 (ref 3–11)
BUN: 21 mg/dL (ref 7–26)
CHLORIDE: 105 mmol/L (ref 98–109)
CO2: 24 mmol/L (ref 22–29)
Calcium: 9.5 mg/dL (ref 8.4–10.4)
Creatinine: 1.33 mg/dL — ABNORMAL HIGH (ref 0.70–1.30)
GFR, EST NON AFRICAN AMERICAN: 53 mL/min — AB (ref >60)
Glucose, Bld: 108 mg/dL (ref 70–140)
POTASSIUM: 4.9 mmol/L (ref 3.5–5.1)
SODIUM: 138 mmol/L (ref 136–145)
Total Bilirubin: 1 mg/dL (ref 0.2–1.2)
Total Protein: 7 g/dL (ref 6.4–8.3)

## 2018-01-04 LAB — CBC WITH DIFFERENTIAL (CANCER CENTER ONLY)
BASOS ABS: 0.1 10*3/uL (ref 0.0–0.1)
Basophils Relative: 1 %
EOS ABS: 0.2 10*3/uL (ref 0.0–0.5)
EOS PCT: 1 %
HCT: 46.6 % (ref 38.7–49.9)
Hemoglobin: 13.6 g/dL (ref 13.0–17.1)
LYMPHS PCT: 6 %
Lymphs Abs: 1.3 10*3/uL (ref 0.9–3.3)
MCH: 21.1 pg — ABNORMAL LOW (ref 28.0–33.4)
MCHC: 29.2 g/dL — ABNORMAL LOW (ref 32.0–35.9)
MCV: 72.4 fL — AB (ref 82.0–98.0)
Monocytes Absolute: 0.7 10*3/uL (ref 0.1–0.9)
Monocytes Relative: 3 %
Neutro Abs: 21.4 10*3/uL — ABNORMAL HIGH (ref 1.5–6.5)
Neutrophils Relative %: 89 %
PLATELETS: 321 10*3/uL (ref 145–400)
RBC: 6.44 MIL/uL — ABNORMAL HIGH (ref 4.20–5.70)
RDW: 23.3 % — AB (ref 11.1–15.7)
WBC: 23.7 10*3/uL — AB (ref 4.0–10.0)

## 2018-01-04 LAB — IRON AND TIBC
Iron: 21 ug/dL — ABNORMAL LOW (ref 42–163)
SATURATION RATIOS: 6 % — AB (ref 42–163)
TIBC: 371 ug/dL (ref 202–409)
UIBC: 350 ug/dL

## 2018-01-04 LAB — FERRITIN: FERRITIN: 21 ng/mL — AB (ref 22–316)

## 2018-01-04 LAB — LACTATE DEHYDROGENASE: LDH: 922 U/L — AB (ref 125–245)

## 2018-01-04 NOTE — Progress Notes (Signed)
1 unit phlebotomy performed over 6 minutes using a 16 gauge phlebotomy set. Patient tolerated well. Nourishment provided.

## 2018-01-04 NOTE — Progress Notes (Signed)
Hematology and Oncology Follow Up Visit  Benjamin [name] [id number] [birth year] 67 y.o. 01/04/2018   Principle Diagnosis:  Polycythemia vera -- JAK2 positive. Perforated gastric ulcer-H pylori positive  Current Therapy:   Phlebotomy to maintain a Hct < 45% EC aspirin 81 mg PO daily    Interim History:  Benjamin [name] is here today for follow-up. He is doing well and staying quite active enjoying the beautiful weather we have had.  His only symptom at this time is itching after a hot shower.  No fever, chills, hot flashes, night sweats, headaches, dizziness, blurred vision, SOB, chest pain, palpitations, abdominal pain or changes in bowel or bladder habits.  He has intermittent puffiness in his right ankle due to twisting it a few months ago. No other swelling, no tenderness in his extremities. He had a successful surgery on his left hand and is receiving PT. He still has a little tingling in that hand as his nerves heal.  No bleeding, bruising or petechiae. No lymphadenopathy found on exam.  He has maintained a good appetite and is staying well hydrated. His weight is stable.   ECOG Performance Status: 1 - Symptomatic but completely ambulatory  Medications:  Allergies as of 01/04/2018      Reactions   Cephalexin Hives, Rash   Biaxin [clarithromycin]    Joint pains   Flagyl [metronidazole] Other (See Comments)   Joint pain   Morphine And Related Hives      Medication List        Accurate as of 01/04/18 10:47 AM. Always use your most recent med list.          aspirin 81 MG tablet Take 81 mg by mouth every morning.   B-complex with vitamin C tablet Take 1 tablet by mouth daily.   cetirizine 10 MG tablet Commonly known as:  ZYRTEC Take 10 mg by mouth daily as needed for allergies.   FINACEA 15 % Foam Generic drug:  Azelaic Acid   Fish Oil 1000 MG Caps Take 2 capsules by mouth daily.   Magnesium 400 MG Caps Take 1 capsule by mouth daily.   Vitamin D 2000 units  Caps Take by mouth.   Vitamin E 400 units Tabs Take 1 tablet by mouth daily.       Allergies:  Allergies  Allergen Reactions  . Cephalexin Hives and Rash  . Biaxin [Clarithromycin]     Joint pains  . Flagyl [Metronidazole] Other (See Comments)    Joint pain  . Morphine And Related Hives    Past Medical History, Surgical history, Social history, and Family History were reviewed and updated.  Review of Systems: All other 10 point review of systems is negative.   Physical Exam:  weight is 170 lb 8 oz (77.3 kg). His oral temperature is 98.1 F (36.7 C). His blood pressure is 139/70 and his pulse is 71. His respiration is 18 and oxygen saturation is 100%.   Wt Readings from Last 3 Encounters:  01/04/18 170 lb 8 oz (77.3 kg)  11/04/17 170 lb 12 oz (77.5 kg)  09/08/17 174 lb 12.8 oz (79.3 kg)    Ocular: Sclerae unicteric, pupils equal, round and reactive to light Ear-nose-throat: Oropharynx clear, dentition fair Lymphatic: No cervical, supraclavicular or axillary adenopathy Lungs no rales or rhonchi, good excursion bilaterally Heart regular rate and rhythm, no murmur appreciated Abd soft, nontender, positive bowel sounds, no liver or spleen tip palpated on exam, no fluid wave  MSK no focal spinal tenderness,  no joint edema Neuro: non-focal, well-oriented, appropriate affect Breasts: Deferred   Lab Results  Component Value Date   WBC 23.7 (H) 01/04/2018   HGB 13.6 01/04/2018   HCT 46.6 01/04/2018   MCV 72.4 (L) 01/04/2018   PLT 321 01/04/2018   Lab Results  Component Value Date   FERRITIN 20 (L) 11/04/2017   IRON 24 (L) 11/04/2017   TIBC 368 11/04/2017   UIBC 343 11/04/2017   IRONPCTSAT 7 (L) 11/04/2017   Lab Results  Component Value Date   RETICCTPCT 2.7 (H) 02/27/2015   RBC 6.44 (H) 01/04/2018   RETICCTABS 173.6 02/27/2015   No results found for: KPAFRELGTCHN, LAMBDASER, KAPLAMBRATIO No results found for: IGGSERUM, IGA, IGMSERUM No results found for:  Odetta Pink, SPEI   Chemistry      Component Value Date/Time   NA 143 11/04/2017 1407   NA 141 07/19/2017 1417   NA 141 05/14/2016 1202   K 4.9 (H) 11/04/2017 1407   K 4.6 07/19/2017 1417   K 4.8 05/14/2016 1202   CL 104 11/04/2017 1407   CL 103 07/19/2017 1417   CO2 31 11/04/2017 1407   CO2 30 07/19/2017 1417   CO2 26 05/14/2016 1202   BUN 23 (H) 11/04/2017 1407   BUN 13 07/19/2017 1417   BUN 14.0 05/14/2016 1202   CREATININE 1.30 (H) 11/04/2017 1407   CREATININE 0.9 07/19/2017 1417   CREATININE 1.0 05/14/2016 1202      Component Value Date/Time   CALCIUM 9.0 11/04/2017 1407   CALCIUM 9.1 07/19/2017 1417   CALCIUM 9.2 05/14/2016 1202   ALKPHOS 954 (H) 11/04/2017 1407   ALKPHOS 870 (H) 07/19/2017 1417   ALKPHOS 913 (H) 05/14/2016 1202   AST 43 (H) 11/04/2017 1407   AST 39 (H) 05/14/2016 1202   ALT 40 11/04/2017 1407   ALT 50 (H) 07/19/2017 1417   ALT 41 05/14/2016 1202   BILITOT 1.2 11/04/2017 1407   BILITOT 1.00 05/14/2016 1202      Impression and Plan: Benjamin [name] is a very pleasant 67 yo caucasian gentleman with polycythemia vera, JAK-2 positive.  Hct today is 46.6% so we will proceed with phlebotomy. He is taking his baby aspirin daily and will continue this same regimen.  We will see him back in another 2 months for follow-up.  He will contact our office with any questions or concerns. We can certainly see him sooner if need be.   Laverna Peace, NP 5/14/201910:47 AM

## 2018-01-04 NOTE — Patient Instructions (Signed)

## 2018-03-08 ENCOUNTER — Inpatient Hospital Stay: Payer: Medicare Other | Attending: Hematology & Oncology | Admitting: Family

## 2018-03-08 ENCOUNTER — Encounter: Payer: Self-pay | Admitting: Family

## 2018-03-08 ENCOUNTER — Inpatient Hospital Stay: Payer: Medicare Other

## 2018-03-08 ENCOUNTER — Other Ambulatory Visit: Payer: Self-pay

## 2018-03-08 VITALS — BP 113/58 | HR 72

## 2018-03-08 VITALS — BP 129/60 | HR 76 | Temp 97.9°F | Resp 18 | Wt 168.8 lb

## 2018-03-08 DIAGNOSIS — B9681 Helicobacter pylori [H. pylori] as the cause of diseases classified elsewhere: Secondary | ICD-10-CM | POA: Insufficient documentation

## 2018-03-08 DIAGNOSIS — D45 Polycythemia vera: Secondary | ICD-10-CM

## 2018-03-08 DIAGNOSIS — K255 Chronic or unspecified gastric ulcer with perforation: Secondary | ICD-10-CM | POA: Insufficient documentation

## 2018-03-08 DIAGNOSIS — D500 Iron deficiency anemia secondary to blood loss (chronic): Secondary | ICD-10-CM

## 2018-03-08 DIAGNOSIS — Z7982 Long term (current) use of aspirin: Secondary | ICD-10-CM | POA: Insufficient documentation

## 2018-03-08 LAB — CMP (CANCER CENTER ONLY)
ALBUMIN: 4.3 g/dL (ref 3.5–5.0)
ALT: 28 U/L (ref 0–44)
AST: 55 U/L — AB (ref 15–41)
Alkaline Phosphatase: 1147 U/L — ABNORMAL HIGH (ref 38–126)
Anion gap: 10 (ref 5–15)
BUN: 24 mg/dL — AB (ref 8–23)
CALCIUM: 9.4 mg/dL (ref 8.9–10.3)
CHLORIDE: 101 mmol/L (ref 98–111)
CO2: 26 mmol/L (ref 22–32)
Creatinine: 1.16 mg/dL (ref 0.61–1.24)
GFR, Est AFR Am: >60 mL/min (ref >60)
GFR, Estimated: >60 mL/min (ref >60)
Glucose, Bld: 101 mg/dL — ABNORMAL HIGH (ref 70–99)
POTASSIUM: 4.9 mmol/L (ref 3.5–5.1)
SODIUM: 137 mmol/L (ref 135–145)
Total Bilirubin: 1.2 mg/dL (ref 0.3–1.2)
Total Protein: 6.9 g/dL (ref 6.5–8.1)

## 2018-03-08 LAB — CBC WITH DIFFERENTIAL (CANCER CENTER ONLY)
BASOS ABS: 0.2 10*3/uL — AB (ref 0.0–0.1)
BASOS PCT: 1 %
EOS ABS: 0.2 10*3/uL (ref 0.0–0.5)
EOS PCT: 1 %
HCT: 46.1 % (ref 38.7–49.9)
Hemoglobin: 13.6 g/dL (ref 13.0–17.1)
LYMPHS PCT: 6 %
Lymphs Abs: 1.5 10*3/uL (ref 0.9–3.3)
MCH: 21.1 pg — ABNORMAL LOW (ref 28.0–33.4)
MCHC: 29.5 g/dL — ABNORMAL LOW (ref 32.0–35.9)
MCV: 71.6 fL — ABNORMAL LOW (ref 82.0–98.0)
Monocytes Absolute: 0.8 10*3/uL (ref 0.1–0.9)
Monocytes Relative: 3 %
Neutro Abs: 21.9 10*3/uL — ABNORMAL HIGH (ref 1.5–6.5)
Neutrophils Relative %: 89 %
PLATELETS: 368 10*3/uL (ref 145–400)
RBC: 6.44 MIL/uL — AB (ref 4.20–5.70)
RDW: 21.9 % — AB (ref 11.1–15.7)
WBC: 24.6 10*3/uL — AB (ref 4.0–10.0)

## 2018-03-08 LAB — LACTATE DEHYDROGENASE: LDH: 885 U/L — ABNORMAL HIGH (ref 98–192)

## 2018-03-08 NOTE — Progress Notes (Signed)
Benjamin [name] presents today for phlebotomy per MD orders. Phlebotomy procedure started at 1350 and ended at 1357 with  540 grams removed via 16 G to RAC. Patient observed for 15 minutes after procedure without any incident. Patient tolerated procedure well. Diet and nutrition offered.

## 2018-03-08 NOTE — Progress Notes (Signed)
Hematology and Oncology Follow Up Visit  Benjamin [name] [id number] [birth year] 67 y.o. 03/08/2018   Principle Diagnosis:  Polycythemia vera -- JAK2 positive. Perforated gastric ulcer-H pylori positive  Current Therapy:   Phlebotomy to maintain a Hct < 45% EC aspirin 81 mg PO daily    Interim History: Benjamin [name] is here today for follow-up. He is doing well and has no complaints at this time. Hct is 46.1%.  He verbalized that he is taking his baby aspirin daily.  No episodes of bleeding, no bruising or petechiae.  No fever, chills, n/v, cough, rash, dizziness, SOB, chest pain, palpitations, abdominal pain or changes in bowel or bladder habits.  The puffiness in his right ankle seems to be a bit better. No tenderness, numbness or tingling in her extremities.  No lymphadenopathy noted on exam.  She has maintained a good appetite and is staying well hydrated. His weight is stable.   ECOG Performance Status: 1 - Symptomatic but completely ambulatory  Medications:  Allergies as of 03/08/2018      Reactions   Cephalexin Hives, Rash   Biaxin [clarithromycin]    Joint pains   Flagyl [metronidazole] Other (See Comments)   Joint pain   Morphine And Related Hives      Medication List        Accurate as of 03/08/18  1:26 PM. Always use your most recent med list.          aspirin 81 MG tablet Take 81 mg by mouth every morning.   B-complex with vitamin C tablet Take 1 tablet by mouth daily.   cetirizine 10 MG tablet Commonly known as:  ZYRTEC Take 10 mg by mouth daily as needed for allergies.   FINACEA 15 % Foam Generic drug:  Azelaic Acid   Fish Oil 1000 MG Caps Take 2 capsules by mouth daily.   Magnesium 400 MG Caps Take 1 capsule by mouth daily.   Vitamin D 2000 units Caps Take by mouth.   Vitamin E 400 units Tabs Take 1 tablet by mouth daily.       Allergies:  Allergies  Allergen Reactions  . Cephalexin Hives and Rash  . Biaxin [Clarithromycin]    Joint pains  . Flagyl [Metronidazole] Other (See Comments)    Joint pain  . Morphine And Related Hives    Past Medical History, Surgical history, Social history, and Family History were reviewed and updated.  Review of Systems: All other 10 point review of systems is negative.   Physical Exam:  weight is 168 lb 12 oz (76.5 kg). His oral temperature is 97.9 F (36.6 C). His blood pressure is 129/60 and his pulse is 76. His respiration is 18 and oxygen saturation is 100%.   Wt Readings from Last 3 Encounters:  03/08/18 168 lb 12 oz (76.5 kg)  01/04/18 170 lb 8 oz (77.3 kg)  11/04/17 170 lb 12 oz (77.5 kg)    Ocular: Sclerae unicteric, pupils equal, round and reactive to light Ear-nose-throat: Oropharynx clear, dentition fair Lymphatic: No cervical, supraclavicular or axillary adenopathy Lungs no rales or rhonchi, good excursion bilaterally Heart regular rate and rhythm, no murmur appreciated Abd soft, nontender, positive bowel sounds, no liver or spleen tip palpated on exam, no fluid wave  MSK no focal spinal tenderness, no joint edema Neuro: non-focal, well-oriented, appropriate affect Breasts: Deferred   Lab Results  Component Value Date   WBC 24.6 (H) 03/08/2018   HGB 13.6 03/08/2018   HCT 46.1 03/08/2018  MCV 71.6 (L) 03/08/2018   PLT 368 03/08/2018   Lab Results  Component Value Date   FERRITIN 21 (L) 01/04/2018   IRON 21 (L) 01/04/2018   TIBC 371 01/04/2018   UIBC 350 01/04/2018   IRONPCTSAT 6 (L) 01/04/2018   Lab Results  Component Value Date   RETICCTPCT 2.7 (H) 02/27/2015   RBC 6.44 (H) 03/08/2018   RETICCTABS 173.6 02/27/2015   No results found for: KPAFRELGTCHN, LAMBDASER, KAPLAMBRATIO No results found for: IGGSERUM, IGA, IGMSERUM No results found for: Kathrynn Ducking, MSPIKE, SPEI   Chemistry      Component Value Date/Time   NA 138 01/04/2018 1018   NA 141 07/19/2017 1417   NA 141 05/14/2016 1202   K  4.9 01/04/2018 1018   K 4.6 07/19/2017 1417   K 4.8 05/14/2016 1202   CL 105 01/04/2018 1018   CL 103 07/19/2017 1417   CO2 24 01/04/2018 1018   CO2 30 07/19/2017 1417   CO2 26 05/14/2016 1202   BUN 21 01/04/2018 1018   BUN 13 07/19/2017 1417   BUN 14.0 05/14/2016 1202   CREATININE 1.33 (H) 01/04/2018 1018   CREATININE 0.9 07/19/2017 1417   CREATININE 1.0 05/14/2016 1202      Component Value Date/Time   CALCIUM 9.5 01/04/2018 1018   CALCIUM 9.1 07/19/2017 1417   CALCIUM 9.2 05/14/2016 1202   ALKPHOS 1,110 (H) 01/04/2018 1018   ALKPHOS 870 (H) 07/19/2017 1417   ALKPHOS 913 (H) 05/14/2016 1202   AST 58 (H) 01/04/2018 1018   AST 39 (H) 05/14/2016 1202   ALT 34 01/04/2018 1018   ALT 50 (H) 07/19/2017 1417   ALT 41 05/14/2016 1202   BILITOT 1.0 01/04/2018 1018   BILITOT 1.00 05/14/2016 1202      Impression and Plan: Benjamin [name] is a very pleasant 67 yo caucasian gentleman with polycythemia vera, JAK-2 positive.  Hct is 46.1% so we will proceed with phlebotomy today.  He will continue taking his baby aspirin daily.  We will plan to see him back in another 2 months for follow-up, lab and phlebotomy.  He will contact our office with any questions or concerns. We can certainly see her sooner if need be.   Laverna Peace, NP 7/16/20191:26 PM

## 2018-03-09 LAB — IRON AND TIBC
IRON: 28 ug/dL — AB (ref 42–163)
Saturation Ratios: 8 % — ABNORMAL LOW (ref 42–163)
TIBC: 378 ug/dL (ref 202–409)
UIBC: 349 ug/dL

## 2018-03-09 LAB — FERRITIN: FERRITIN: 33 ng/mL (ref 24–336)

## 2018-04-28 ENCOUNTER — Ambulatory Visit (HOSPITAL_COMMUNITY)
Admission: EM | Admit: 2018-04-28 | Discharge: 2018-04-28 | Disposition: A | Discharge status: Home / Self Care | Payer: Medicare Other | Attending: Family Medicine | Admitting: Family Medicine

## 2018-04-28 ENCOUNTER — Encounter (HOSPITAL_COMMUNITY): Payer: Self-pay | Admitting: Emergency Medicine

## 2018-04-28 DIAGNOSIS — L0291 Cutaneous abscess, unspecified: Secondary | ICD-10-CM

## 2018-04-28 MED ORDER — DOXYCYCLINE HYCLATE 100 MG PO CAPS: 100.0000 mg | ORAL_CAPSULE | Freq: Two times a day (BID) | ORAL | 0 refills | Status: DC

## 2018-04-28 NOTE — ED Triage Notes (Signed)
PT has an abscess to right cheek since Sunday.

## 2018-04-28 NOTE — Discharge Instructions (Addendum)
It was nice meeting you!!  We drained the abscess on your face.  Keep covered if still draining.  Doxycycline for coverage.  Follow up as needed for continued or worsening symptoms

## 2018-04-30 DIAGNOSIS — L0291 Cutaneous abscess, unspecified: Secondary | ICD-10-CM | POA: Diagnosis not present

## 2018-04-30 NOTE — ED Provider Notes (Addendum)
[facility]    CSN: [id number] Arrival date & time: 04/28/18  1255     History   Chief Complaint Chief Complaint  Patient presents with  . Abscess    HPI Benjamin [name] is a 67 y.o. male.   Pt is a 67 year old male with abscess to the right cheek since Sunday. This abscess has gotten larger, more painful and erythematous. He denies drainage from the abscess. He denies hx of same. He has been using warm compresses on the abscess.   He does not smoke     Past Medical History:  Diagnosis Date  . Anemia   . Clotting disorder ([place])   . Colon polyps   . DVT (deep venous thrombosis) ([name])    hx -lt leg-dx polycythemia  . Focal dystonia   . GERD (gastroesophageal reflux disease)   . P. vera ([place]) 08/19/2011  . Perforated bowel ([place])   . Perforated stomach ([place])    denies perforated bowel  . Polycythemia   . Sepsis [facility])     Patient Active Problem List   Diagnosis Date Noted  . Sepsis ([place]) 01/01/2016  . Leukocytosis 01/01/2016  . Perforated gastric ulcer ([place]) 12/15/2015  . 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 P. vera ([place]) 08/19/2011    Past Surgical History:  Procedure Laterality Date  . COLONOSCOPY    . COLONOSCOPY    . 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 FINGER ARTHROPLASTY  1998   rt index  . HERNIA REPAIR    . INGUINAL HERNIA REPAIR  2007   left  . INGUINAL HERNIA REPAIR  2009   right  . LAPAROTOMY N/A 12/15/2015   Procedure: EXPLORATORY LAPAROTOMY;  Surgeon: Leighton Ruff, MD;  Location: [facility];  Service: General;  Laterality: N/A;  . WISDOM TOOTH EXTRACTION         Home Medications    Prior to Admission medications   Medication Sig Start Date End Date Taking? Authorizing Provider  sulfamethoxazole-trimethoprim (BACTRIM,SEPTRA)  400-80 MG tablet Take 1 tablet by mouth 2 (two) times daily.   Yes [provider]  aspirin 81 MG tablet Take 81 mg by mouth every morning.     [provider]  B Complex-C (B-COMPLEX WITH VITAMIN C) tablet Take 1 tablet by mouth daily.    [provider]  cetirizine (ZYRTEC) 10 MG tablet Take 10 mg by mouth daily as needed for allergies.    [provider]  Cholecalciferol (VITAMIN D) 2000 units CAPS Take by mouth.    [provider]  doxycycline (VIBRAMYCIN) 100 MG capsule Take 1 capsule (100 mg total) by mouth 2 (two) times daily. 04/28/18   Orvan July, NP  FINACEA 15 % FOAM  06/05/16   [provider]  Magnesium 400 MG CAPS Take 1 capsule by mouth daily.     [provider]  Omega-3 Fatty Acids (FISH OIL) 1000 MG CAPS Take 2 capsules by mouth daily.     [provider]  Vitamin E 400 UNITS TABS Take 1 tablet by mouth daily.     [provider]    Family History Family History  Problem Relation Age of Onset  . Heart disease Father   . Hyperlipidemia Father   . Hypertension Father   . Diabetes Father   .  Diabetes Brother   . Hyperlipidemia Brother   . Prostate cancer Brother        oldest brother  . Colon cancer Neg Hx     Social History Social History   Tobacco Use  . Smoking status: Never Smoker  . Smokeless tobacco: Never Used  . Tobacco comment: never used tobacco  Substance Use Topics  . Alcohol use: Yes    Alcohol/week: 0.0 standard drinks    Comment: almost daily wine  . Drug use: No     Allergies   Cephalexin; Biaxin [clarithromycin]; Flagyl [metronidazole]; and Morphine and related   Review of Systems Review of Systems  Constitutional: Negative for activity change, appetite change, fatigue and fever.  Skin: Positive for color change and wound.  All other systems reviewed and are negative.    Physical Exam Triage Vital Signs ED Triage Vitals [04/28/18 1311]  Enc Vitals  Group     BP 140/70     Pulse Rate 82     Resp 16     Temp 98.1 F (36.7 C)     Temp Source Oral     SpO2 100 %     Weight 170 lb (77.1 kg)     Height      Head Circumference      Peak Flow      Pain Score 2     Pain Loc      Pain Edu?      Excl. in [place]?    No data found.  Updated Vital Signs BP 140/70   Pulse 82   Temp 98.1 F (36.7 C) (Oral)   Resp 16   Wt 170 lb (77.1 kg)   SpO2 100%   BMI 23.06 kg/m   Visual Acuity Right Eye Distance:   Left Eye Distance:   Bilateral Distance:    Right Eye Near:   Left Eye Near:    Bilateral Near:     Physical Exam  Constitutional: He is oriented to person, place, and time. He appears well-developed and well-nourished.  Very pleasant. Non toxic or ill appearing.     HENT:  Head: Normocephalic and atraumatic.  Eyes: Conjunctivae are normal.  Neck: Normal range of motion.  Pulmonary/Chest: Effort normal.  Musculoskeletal: Normal range of motion.  Neurological: He is alert and oriented to person, place, and time.  Skin: Skin is warm and dry.  Approx 1.5 cm by 1.5 cm abscess to right cheek. .5 cm fluctuation with surrounding induration and erythema.   Psychiatric: He has a normal mood and affect.  Nursing note and vitals reviewed.    UC Treatments / Results  Labs (all labs ordered are listed, but only abnormal results are displayed) Labs Reviewed - No data to display  EKG None  Radiology No results found.  Procedures Incision and Drainage Date/Time: 04/30/2018 10:06 AM Performed by: Orvan July, NP Authorized by: Vanessa Kick, MD   Consent:    Consent obtained:  Verbal   Consent given by:  Patient   Risks discussed:  Bleeding and incomplete drainage   Alternatives discussed:  No treatment and delayed treatment Location:    Type:  Abscess   Location:  Head   Head location:  Face Pre-procedure details:    Skin preparation:  Betadine Anesthesia (see MAR for exact dosages):    Anesthesia method:   Local infiltration   Local anesthetic:  Lidocaine 2% w/o epi Procedure type:    Complexity:  Simple Procedure details:  Needle aspiration: no     Incision types:  Single straight   Incision depth:  Subcutaneous   Scalpel blade:  11   Drainage:  Bloody and purulent   Drainage amount:  Moderate   Wound treatment:  Wound left open Post-procedure details:    Patient tolerance of procedure:  Tolerated well, no immediate complications   (including critical care time)  Medications Ordered in UC Medications - No data to display  Initial Impression / Assessment and Plan / UC Course  I have reviewed the triage vital signs and the nursing notes.  Pertinent labs & imaging results that were available during my care of the patient were reviewed by me and considered in my medical decision making (see chart for details).     I&D ob abscess. Pt tolerated well.  Doxycycline for antibiotic coverage.  Follow up as needed for continued or worsening symptoms  Final Clinical Impressions(s) / UC Diagnoses   Final diagnoses:  Abscess     Discharge Instructions     It was nice meeting you!!  We drained the abscess on your face.  Keep covered if still draining.  Doxycycline for coverage.  Follow up as needed for continued or worsening symptoms      ED Prescriptions    Medication Sig Dispense Auth. Provider   doxycycline (VIBRAMYCIN) 100 MG capsule Take 1 capsule (100 mg total) by mouth 2 (two) times daily. 20 capsule Loura Halt A, NP     Controlled Substance Prescriptions [place] Controlled Substance Registry consulted? Not Applicable   Orvan July, NP 04/30/18 1005    Loura Halt A, NP 04/30/18 1007

## 2018-05-09 ENCOUNTER — Ambulatory Visit: Payer: Medicare Other | Admitting: Family

## 2018-05-09 ENCOUNTER — Inpatient Hospital Stay: Payer: Medicare Other

## 2018-05-09 ENCOUNTER — Inpatient Hospital Stay: Payer: Medicare Other | Attending: Hematology & Oncology

## 2018-05-09 VITALS — BP 141/62 | HR 72

## 2018-05-09 DIAGNOSIS — D500 Iron deficiency anemia secondary to blood loss (chronic): Secondary | ICD-10-CM

## 2018-05-09 DIAGNOSIS — D45 Polycythemia vera: Secondary | ICD-10-CM

## 2018-05-09 LAB — CMP (CANCER CENTER ONLY)
ALK PHOS: 971 U/L — AB (ref 38–126)
ALT: 23 U/L (ref 0–44)
AST: 28 U/L (ref 15–41)
Albumin: 4 g/dL (ref 3.5–5.0)
Anion gap: 9 (ref 5–15)
BUN: 15 mg/dL (ref 8–23)
CHLORIDE: 105 mmol/L (ref 98–111)
CO2: 28 mmol/L (ref 22–32)
CREATININE: 0.91 mg/dL (ref 0.61–1.24)
Calcium: 9.3 mg/dL (ref 8.9–10.3)
GFR, Est AFR Am: >60 mL/min (ref >60)
Glucose, Bld: 96 mg/dL (ref 70–99)
Potassium: 4.4 mmol/L (ref 3.5–5.1)
Sodium: 142 mmol/L (ref 135–145)
Total Bilirubin: 1 mg/dL (ref 0.3–1.2)
Total Protein: 6.4 g/dL — ABNORMAL LOW (ref 6.5–8.1)

## 2018-05-09 LAB — CBC WITH DIFFERENTIAL (CANCER CENTER ONLY)
Basophils Absolute: 0.2 10*3/uL — ABNORMAL HIGH (ref 0.0–0.1)
Basophils Relative: 1 %
EOS ABS: 0.2 10*3/uL (ref 0.0–0.5)
Eosinophils Relative: 1 %
HCT: 45.8 % (ref 38.7–49.9)
HEMOGLOBIN: 13.3 g/dL (ref 13.0–17.1)
LYMPHS ABS: 1.3 10*3/uL (ref 0.9–3.3)
LYMPHS PCT: 6 %
MCH: 21.1 pg — ABNORMAL LOW (ref 28.0–33.4)
MCHC: 29 g/dL — ABNORMAL LOW (ref 32.0–35.9)
MCV: 72.6 fL — AB (ref 82.0–98.0)
Monocytes Absolute: 0.7 10*3/uL (ref 0.1–0.9)
Monocytes Relative: 3 %
NEUTROS ABS: 18.7 10*3/uL — AB (ref 1.5–6.5)
Neutrophils Relative %: 89 %
PLATELETS: 360 10*3/uL (ref 145–400)
RBC: 6.31 MIL/uL — AB (ref 4.20–5.70)
RDW: 20.9 % — ABNORMAL HIGH (ref 11.1–15.7)
WBC Count: 21.2 10*3/uL — ABNORMAL HIGH (ref 4.0–10.0)

## 2018-05-09 LAB — RETICULOCYTES
RBC.: 6.2 MIL/uL — AB (ref 4.20–5.82)
RETIC CT PCT: 2.7 % — AB (ref 0.8–1.8)
Retic Count, Absolute: 167.4 10*3/uL — ABNORMAL HIGH (ref 34.8–93.9)

## 2018-05-09 NOTE — Progress Notes (Signed)
Benjamin [name] presents today for phlebotomy per MD orders. Phlebotomy procedure started at 1432 and ended at 1445. 478 grams removed from lt AC using 16g phlebotomy kit. Patient observed for 30 minutes after procedure without any incident. Patient tolerated procedure well. IV needle removed intact.

## 2018-05-09 NOTE — Patient Instructions (Signed)

## 2018-05-10 LAB — IRON AND TIBC
IRON: 17 ug/dL — AB (ref 42–163)
SATURATION RATIOS: 5 % — AB (ref 42–163)
TIBC: 348 ug/dL (ref 202–409)
UIBC: 331 ug/dL

## 2018-05-10 LAB — FERRITIN: FERRITIN: 15 ng/mL — AB (ref 24–336)

## 2018-07-08 ENCOUNTER — Other Ambulatory Visit: Payer: Self-pay | Admitting: *Deleted

## 2018-07-08 DIAGNOSIS — D45 Polycythemia vera: Secondary | ICD-10-CM

## 2018-07-11 ENCOUNTER — Inpatient Hospital Stay: Payer: Medicare Other

## 2018-07-11 ENCOUNTER — Inpatient Hospital Stay: Payer: Medicare Other | Attending: Hematology & Oncology | Admitting: Family

## 2018-07-11 VITALS — BP 123/51 | HR 86 | Temp 98.1°F | Resp 18 | Ht 72.0 in | Wt 168.1 lb

## 2018-07-11 DIAGNOSIS — D45 Polycythemia vera: Secondary | ICD-10-CM | POA: Diagnosis present

## 2018-07-11 DIAGNOSIS — Z7982 Long term (current) use of aspirin: Secondary | ICD-10-CM | POA: Diagnosis not present

## 2018-07-11 DIAGNOSIS — M7989 Other specified soft tissue disorders: Secondary | ICD-10-CM

## 2018-07-11 LAB — CBC WITH DIFFERENTIAL (CANCER CENTER ONLY)
Abs Immature Granulocytes: 0.81 10*3/uL — ABNORMAL HIGH (ref 0.00–0.07)
BASOS ABS: 0.2 10*3/uL — AB (ref 0.0–0.1)
BASOS PCT: 1 %
EOS ABS: 0.2 10*3/uL (ref 0.0–0.5)
Eosinophils Relative: 1 %
HCT: 50.1 % (ref 39.0–52.0)
Hemoglobin: 13.6 g/dL (ref 13.0–17.0)
Immature Granulocytes: 5 %
Lymphocytes Relative: 8 %
Lymphs Abs: 1.3 10*3/uL (ref 0.7–4.0)
MCH: 21.4 pg — ABNORMAL LOW (ref 26.0–34.0)
MCHC: 27.1 g/dL — ABNORMAL LOW (ref 30.0–36.0)
MCV: 78.6 fL — ABNORMAL LOW (ref 80.0–100.0)
Monocytes Absolute: 0.7 10*3/uL (ref 0.1–1.0)
Monocytes Relative: 5 %
NEUTROS ABS: 12.3 10*3/uL — AB (ref 1.7–7.7)
NRBC: 0.4 % — AB (ref 0.0–0.2)
Neutrophils Relative %: 80 %
PLATELETS: 380 10*3/uL (ref 150–400)
RBC: 6.37 MIL/uL — AB (ref 4.22–5.81)
RDW: 21.3 % — AB (ref 11.5–15.5)
WBC Count: 15.5 10*3/uL — ABNORMAL HIGH (ref 4.0–10.5)

## 2018-07-11 NOTE — Progress Notes (Signed)
One unit phlebotomy taken from left ac without incident.  Tolerated well and site is unremarkable.

## 2018-07-11 NOTE — Patient Instructions (Signed)

## 2018-07-11 NOTE — Progress Notes (Signed)
Hematology and Oncology Follow Up Visit  Benjamin [name] [id number] [birth year] 67 y.o. 07/11/2018   Principle Diagnosis:  Polycythemia vera -- JAK2 positive. Perforated gastric ulcer-H pylori positive  Current Therapy:   Phlebotomy to maintain a Hct < 45% EC aspirin 81 mg PO daily   Interim History:  Benjamin [name] is here today for follow-up. He noted constant swelling in the right ankle and foot for the last 6 weeks. He states that the redness and pan have resolved but the swelling has stayed. Pedal pulses are 2+. He noticed this after holding his right foot in an odd position for an extended period of time playing guitar.  No numbness or tingling in his extremities.  He verbalized that he is taking his baby aspirin daily as prescribed.   No bleeding, bruising or petechial rash.  No fever, chills, n/v, cough, rash, dizziness, SOB, chest pain, palpitations, abdominal pain or changes in bowel or bladder habits.  No lymphadenopathy noted on exam.  He is eating well but admits that he needs to hydrate better.  He has started going to a new gym with [employer] several times a week and has a Clinical research associate.   ECOG Performance Status: 1 - Symptomatic but completely ambulatory  Medications:  Allergies as of 07/11/2018      Reactions   Biaxin [clarithromycin] Other (See Comments)   Joint pains   Cephalexin Hives, Rash   Flagyl [metronidazole] Other (See Comments)   Joint pain   Morphine And Related Hives      Medication List        Accurate as of 07/11/18  2:17 PM. Always use your most recent med list.          aspirin 81 MG tablet Take 81 mg by mouth every morning.   B-complex with vitamin C tablet Take 1 tablet by mouth daily.   FINACEA 15 % Foam Generic drug:  Azelaic Acid   Fish Oil 1000 MG Caps Take 2 capsules by mouth daily.   Magnesium 400 MG Caps Take 1 capsule by mouth daily.   sulfamethoxazole-trimethoprim 400-80 MG tablet Commonly known as:   BACTRIM,SEPTRA Take 1 tablet by mouth 2 (two) times daily.   Vitamin D 50 MCG (2000 UT) Caps Take by mouth.   Vitamin E 400 units Tabs Take 1 tablet by mouth daily.       Allergies:  Allergies  Allergen Reactions  . Biaxin [Clarithromycin] Other (See Comments)    Joint pains  . Cephalexin Hives and Rash  . Flagyl [Metronidazole] Other (See Comments)    Joint pain  . Morphine And Related Hives    Past Medical History, Surgical history, Social history, and Family History were reviewed and updated.  Review of Systems: All other 10 point review of systems is negative.   Physical Exam:  height is 6' (1.829 m) and weight is 168 lb 1.9 oz (76.3 kg). His oral temperature is 98.1 F (36.7 C). His blood pressure is 123/51 (abnormal) and his pulse is 86. His respiration is 18 and oxygen saturation is 98%.   Wt Readings from Last 3 Encounters:  07/11/18 168 lb 1.9 oz (76.3 kg)  04/28/18 170 lb (77.1 kg)  03/08/18 168 lb 12 oz (76.5 kg)    Ocular: Sclerae unicteric, pupils equal, round and reactive to light Ear-nose-throat: Oropharynx clear, dentition fair Lymphatic: No cervical, supraclavicular or axillary adenopathy Lungs no rales or rhonchi, good excursion bilaterally Heart regular rate and rhythm, no murmur appreciated Abd soft,  nontender, positive bowel sounds, no liver or spleen tip palpated on exam, no fluid wave MSK no focal spinal tenderness, no joint edema Neuro: non-focal, well-oriented, appropriate affect Breasts: Deferred   Lab Results  Component Value Date   WBC 15.5 (H) 07/11/2018   HGB 13.6 07/11/2018   HCT 50.1 07/11/2018   MCV 78.6 (L) 07/11/2018   PLT 380 07/11/2018   Lab Results  Component Value Date   FERRITIN 15 (L) 05/09/2018   IRON 17 (L) 05/09/2018   TIBC 348 05/09/2018   UIBC 331 05/09/2018   IRONPCTSAT 5 (L) 05/09/2018   Lab Results  Component Value Date   RETICCTPCT 2.7 (H) 05/09/2018   RBC 6.37 (H) 07/11/2018   RETICCTABS 173.6  02/27/2015   No results found for: KPAFRELGTCHN, LAMBDASER, KAPLAMBRATIO No results found for: IGGSERUM, IGA, IGMSERUM No results found for: Kathrynn Ducking, MSPIKE, SPEI   Chemistry      Component Value Date/Time   NA 142 05/09/2018 1342   NA 141 07/19/2017 1417   NA 141 05/14/2016 1202   K 4.4 05/09/2018 1342   K 4.6 07/19/2017 1417   K 4.8 05/14/2016 1202   CL 105 05/09/2018 1342   CL 103 07/19/2017 1417   CO2 28 05/09/2018 1342   CO2 30 07/19/2017 1417   CO2 26 05/14/2016 1202   BUN 15 05/09/2018 1342   BUN 13 07/19/2017 1417   BUN 14.0 05/14/2016 1202   CREATININE 0.91 05/09/2018 1342   CREATININE 0.9 07/19/2017 1417   CREATININE 1.0 05/14/2016 1202      Component Value Date/Time   CALCIUM 9.3 05/09/2018 1342   CALCIUM 9.1 07/19/2017 1417   CALCIUM 9.2 05/14/2016 1202   ALKPHOS 971 (H) 05/09/2018 1342   ALKPHOS 870 (H) 07/19/2017 1417   ALKPHOS 913 (H) 05/14/2016 1202   AST 28 05/09/2018 1342   AST 39 (H) 05/14/2016 1202   ALT 23 05/09/2018 1342   ALT 50 (H) 07/19/2017 1417   ALT 41 05/14/2016 1202   BILITOT 1.0 05/09/2018 1342   BILITOT 1.00 05/14/2016 1202       Impression and Plan: Benjamin [name] is a very pleasant 67 yo caucasian gentleman with polycythemia vera, JAK-2 positive.  Hct today is up at 50.1, WBC count is better at 15.5, platelets 380. We will phlebotomize him today and again in 2 weeks with follow-up in 10 weeks per his request.  He will continue to take his baby aspirin daily and make sure to hydrate wekk especially when working out.  We will also get and US of the right leg to rule out DVT.  He will contact our office with any questions or concerns. We can certainly see her sooner if need be.    Laverna Peace, NP 11/18/20192:17 PM

## 2018-07-12 ENCOUNTER — Ambulatory Visit (HOSPITAL_BASED_OUTPATIENT_CLINIC_OR_DEPARTMENT_OTHER)
Admission: RE | Admit: 2018-07-12 | Discharge: 2018-07-12 | Disposition: A | Discharge status: Home / Self Care | Payer: Medicare Other | Source: Ambulatory Visit | Attending: Family | Admitting: Family

## 2018-07-12 DIAGNOSIS — M7989 Other specified soft tissue disorders: Secondary | ICD-10-CM | POA: Insufficient documentation

## 2018-07-14 ENCOUNTER — Telehealth: Payer: Self-pay | Admitting: *Deleted

## 2018-07-14 NOTE — Telephone Encounter (Signed)
As noted below by Laverna Peace, NP, I informed patient that he does not have a DVT. He verbalized understanding.

## 2018-07-14 NOTE — Telephone Encounter (Signed)
-----   Message from Eliezer Bottom, NP sent at 07/14/2018  1:25 PM EST ----- No DVT! WOO HOO!  Sarah   ----- Message ----- From: Buel Ream, Rad Results In Sent: 07/12/2018   4:15 PM EST To: Eliezer Bottom, NP

## 2018-07-19 ENCOUNTER — Inpatient Hospital Stay: Payer: Medicare Other

## 2018-07-19 VITALS — BP 136/53 | HR 74 | Temp 97.7°F | Resp 18

## 2018-07-19 DIAGNOSIS — D45 Polycythemia vera: Secondary | ICD-10-CM

## 2018-07-19 NOTE — Patient Instructions (Signed)
Therapeutic Phlebotomy Therapeutic phlebotomy is the controlled removal of blood from a person's body for the purpose of treating a medical condition. The procedure is similar to donating blood. Usually, about a pint (470 mL, or 0.47L) of blood is removed. The average adult has 9-12 pints (4.3-5.7 L) of blood. Therapeutic phlebotomy may be used to treat the following medical conditions:  Hemochromatosis. This is a condition in which the blood contains too much iron.  Polycythemia vera. This is a condition in which the blood contains too many red blood cells.  Porphyria cutanea tarda. This is a disease in which an important part of hemoglobin is not made properly. It results in the buildup of abnormal amounts of porphyrins in the body.  Sickle cell disease. This is a condition in which the red blood cells form an abnormal crescent shape rather than a round shape.  Tell a health care provider about:  Any allergies you have.  All medicines you are taking, including vitamins, herbs, eye drops, creams, and over-the-counter medicines.  Any problems you or family members have had with anesthetic medicines.  Any blood disorders you have.  Any surgeries you have had.  Any medical conditions you have. What are the risks? Generally, this is a safe procedure. However, problems may occur, including:  Nausea or light-headedness.  Low blood pressure.  Soreness, bleeding, swelling, or bruising at the needle insertion site.  Infection.  What happens before the procedure?  Follow instructions from your health care provider about eating or drinking restrictions.  Ask your health care provider about changing or stopping your regular medicines. This is especially important if you are taking diabetes medicines or blood thinners.  Wear clothing with sleeves that can be raised above the elbow.  Plan to have someone take you home after the procedure.  You may have a blood sample taken. What  happens during the procedure?  A needle will be inserted into one of your veins.  Tubing and a collection bag will be attached to that needle.  Blood will flow through the needle and tubing into the collection bag.  You may be asked to open and close your hand slowly and continually during the entire collection.  After the specified amount of blood has been removed from your body, the collection bag and tubing will be clamped.  The needle will be removed from your vein.  Pressure will be held on the site of the needle insertion to stop the bleeding.  A bandage (dressing) will be placed over the needle insertion site. The procedure may vary among health care providers and hospitals. What happens after the procedure?  Your recovery will be assessed and monitored.  You can return to your normal activities as directed by your health care provider. This information is not intended to replace advice given to you by your health care provider. Make sure you discuss any questions you have with your health care provider. Document Released: 01/12/2011 Document Revised: 04/11/2016 Document Reviewed: 08/06/2014 Elsevier Interactive Patient Education  2018 [employer] Inc[employer]  

## 2018-07-19 NOTE — Progress Notes (Signed)
Benjamin [name] presents today for phlebotomy per MD orders. Phlebotomy procedure started at 1320 and ended at 1330. 512 grams removed via 16 gauge catheter. Patient observed for 30 minutes after procedure without any incident. Patient tolerated procedure well. IV needle removed intact.

## 2018-09-13 ENCOUNTER — Inpatient Hospital Stay: Payer: Medicare Other

## 2018-09-13 ENCOUNTER — Inpatient Hospital Stay: Payer: Medicare Other | Attending: Hematology & Oncology | Admitting: Family

## 2018-09-13 ENCOUNTER — Other Ambulatory Visit: Payer: Self-pay

## 2018-09-13 VITALS — BP 146/58 | HR 81 | Temp 98.2°F | Wt 172.5 lb

## 2018-09-13 DIAGNOSIS — D45 Polycythemia vera: Secondary | ICD-10-CM | POA: Diagnosis present

## 2018-09-13 DIAGNOSIS — Z7982 Long term (current) use of aspirin: Secondary | ICD-10-CM | POA: Diagnosis not present

## 2018-09-13 DIAGNOSIS — K255 Chronic or unspecified gastric ulcer with perforation: Secondary | ICD-10-CM | POA: Insufficient documentation

## 2018-09-13 DIAGNOSIS — B9681 Helicobacter pylori [H. pylori] as the cause of diseases classified elsewhere: Secondary | ICD-10-CM

## 2018-09-13 LAB — CBC WITH DIFFERENTIAL (CANCER CENTER ONLY)
ABS IMMATURE GRANULOCYTES: 1.21 10*3/uL — AB (ref 0.00–0.07)
BASOS ABS: 0.2 10*3/uL — AB (ref 0.0–0.1)
BASOS PCT: 1 %
EOS ABS: 0.2 10*3/uL (ref 0.0–0.5)
Eosinophils Relative: 1 %
HCT: 43.7 % (ref 39.0–52.0)
Hemoglobin: 12.2 g/dL — ABNORMAL LOW (ref 13.0–17.0)
IMMATURE GRANULOCYTES: 6 %
LYMPHS ABS: 1.4 10*3/uL (ref 0.7–4.0)
Lymphocytes Relative: 6 %
MCH: 21.8 pg — ABNORMAL LOW (ref 26.0–34.0)
MCHC: 27.9 g/dL — ABNORMAL LOW (ref 30.0–36.0)
MCV: 78.2 fL — AB (ref 80.0–100.0)
MONOS PCT: 3 %
Monocytes Absolute: 0.7 10*3/uL (ref 0.1–1.0)
NEUTROS ABS: 18.3 10*3/uL — AB (ref 1.7–7.7)
NEUTROS PCT: 83 %
NRBC: 0.2 % (ref 0.0–0.2)
PLATELETS: 411 10*3/uL — AB (ref 150–400)
RBC: 5.59 MIL/uL (ref 4.22–5.81)
RDW: 21.4 % — AB (ref 11.5–15.5)
WBC Count: 22 10*3/uL — ABNORMAL HIGH (ref 4.0–10.5)

## 2018-09-13 LAB — CMP (CANCER CENTER ONLY)
ALBUMIN: 4.4 g/dL (ref 3.5–5.0)
ALT: 24 U/L (ref 0–44)
ANION GAP: 8 (ref 5–15)
AST: 31 U/L (ref 15–41)
Alkaline Phosphatase: 1004 U/L — ABNORMAL HIGH (ref 38–126)
BUN: 19 mg/dL (ref 8–23)
CALCIUM: 9.2 mg/dL (ref 8.9–10.3)
CO2: 29 mmol/L (ref 22–32)
Chloride: 104 mmol/L (ref 98–111)
Creatinine: 1.02 mg/dL (ref 0.61–1.24)
GFR, Estimated: >60 mL/min (ref >60)
GLUCOSE: 88 mg/dL (ref 70–99)
POTASSIUM: 4.2 mmol/L (ref 3.5–5.1)
SODIUM: 141 mmol/L (ref 135–145)
TOTAL PROTEIN: 6.2 g/dL — AB (ref 6.5–8.1)
Total Bilirubin: 1.1 mg/dL (ref 0.3–1.2)

## 2018-09-13 NOTE — Progress Notes (Signed)
Hematology and Oncology Follow Up Visit  Benjamin [name] [id number] 02/08/1951 68 y.o. 09/13/2018   Principle Diagnosis:  Polycythemia vera -- JAK2 positive. Perforated gastric ulcer-H pylori positive  Current Therapy:   Phlebotomy to maintain a Hct < 45% EC aspirin 81 mg PO daily   Interim History:  Benjamin [name] is here today for follow-up. He is doing well and has no complaints at this time.  He still has some itching for 10 minutes or so after a hot shower.  He is staying active going to the gym several days a week to workout.  He states that he recently had has annual physical and his alk/phos and WBC count are remaining stable.  He is taking his baby aspirin daily as prescribed.  He has had no issue with infections. No fever, chills, n/v, cough, rash, dizziness, headaches, blurred vision, SOB, chest pain, palpitations, abdominal pain or changes in bowel or bladder habits.  No swelling, tenderness, numbness or tingling in his extremities.  No lymphadenopathy noted on exam.  No episodes of bleeding, no bruising or petechiae.  He has maintained a good appetite and is staying well hydrated. His weight is stable.   ECOG Performance Status: 1 - Symptomatic but completely ambulatory  Medications:  Allergies as of 09/13/2018      Reactions   Biaxin [clarithromycin] Other (See Comments)   Joint pains   Cephalexin Hives, Rash   Flagyl [metronidazole] Other (See Comments)   Joint pain   Morphine And Related Hives      Medication List       Accurate as of September 13, 2018  1:37 PM. Always use your most recent med list.        aspirin 81 MG tablet Take 81 mg by mouth every morning.   B-complex with vitamin C tablet Take 1 tablet by mouth daily.   FINACEA 15 % Foam Generic drug:  Azelaic Acid   Fish Oil 1000 MG Caps Take 2 capsules by mouth daily.   Magnesium 400 MG Caps Take 1 capsule by mouth daily.   sulfamethoxazole-trimethoprim 400-80 MG tablet Commonly  known as:  BACTRIM,SEPTRA Take 1 tablet by mouth 2 (two) times daily.   Vitamin D 50 MCG (2000 UT) Caps Take by mouth.   Vitamin E 400 units Tabs Take 1 tablet by mouth daily.       Allergies:  Allergies  Allergen Reactions  . Biaxin [Clarithromycin] Other (See Comments)    Joint pains  . Cephalexin Hives and Rash  . Flagyl [Metronidazole] Other (See Comments)    Joint pain  . Morphine And Related Hives    Past Medical History, Surgical history, Social history, and Family History were reviewed and updated.  Review of Systems: All other 10 point review of systems is negative.   Physical Exam:  weight is 172 lb 8 oz (78.2 kg). His oral temperature is 98.2 F (36.8 C). His blood pressure is 146/58 (abnormal) and his pulse is 81. His oxygen saturation is 100%.   Wt Readings from Last 3 Encounters:  09/13/18 172 lb 8 oz (78.2 kg)  07/11/18 168 lb 1.9 oz (76.3 kg)  04/28/18 170 lb (77.1 kg)    Ocular: Sclerae unicteric, pupils equal, round and reactive to light Ear-nose-throat: Oropharynx clear, dentition fair Lymphatic: No cervical, supraclavicular or axillary adenopathy Lungs no rales or rhonchi, good excursion bilaterally Heart regular rate and rhythm, no murmur appreciated Abd soft, nontender, positive bowel sounds, no liver or spleen tip palpated on  exam, no fluid wave  MSK no focal spinal tenderness, no joint edema Neuro: non-focal, well-oriented, appropriate affect Breasts: Deferred   Lab Results  Component Value Date   WBC 22.0 (H) 09/13/2018   HGB 12.2 (L) 09/13/2018   HCT 43.7 09/13/2018   MCV 78.2 (L) 09/13/2018   PLT 411 (H) 09/13/2018   Lab Results  Component Value Date   FERRITIN 15 (L) 05/09/2018   IRON 17 (L) 05/09/2018   TIBC 348 05/09/2018   UIBC 331 05/09/2018   IRONPCTSAT 5 (L) 05/09/2018   Lab Results  Component Value Date   RETICCTPCT 2.7 (H) 05/09/2018   RBC 5.59 09/13/2018   RETICCTABS 173.6 02/27/2015   No results found for:  KPAFRELGTCHN, LAMBDASER, KAPLAMBRATIO No results found for: IGGSERUM, IGA, IGMSERUM No results found for: Kathrynn Ducking, MSPIKE, SPEI   Chemistry      Component Value Date/Time   NA 142 05/09/2018 1342   NA 141 07/19/2017 1417   NA 141 05/14/2016 1202   K 4.4 05/09/2018 1342   K 4.6 07/19/2017 1417   K 4.8 05/14/2016 1202   CL 105 05/09/2018 1342   CL 103 07/19/2017 1417   CO2 28 05/09/2018 1342   CO2 30 07/19/2017 1417   CO2 26 05/14/2016 1202   BUN 15 05/09/2018 1342   BUN 13 07/19/2017 1417   BUN 14.0 05/14/2016 1202   CREATININE 0.91 05/09/2018 1342   CREATININE 0.9 07/19/2017 1417   CREATININE 1.0 05/14/2016 1202      Component Value Date/Time   CALCIUM 9.3 05/09/2018 1342   CALCIUM 9.1 07/19/2017 1417   CALCIUM 9.2 05/14/2016 1202   ALKPHOS 971 (H) 05/09/2018 1342   ALKPHOS 870 (H) 07/19/2017 1417   ALKPHOS 913 (H) 05/14/2016 1202   AST 28 05/09/2018 1342   AST 39 (H) 05/14/2016 1202   ALT 23 05/09/2018 1342   ALT 50 (H) 07/19/2017 1417   ALT 41 05/14/2016 1202   BILITOT 1.0 05/09/2018 1342   BILITOT 1.00 05/14/2016 1202       Impression and Plan: Benjamin [name] is a very pleasant 68 yo caucasian gentleman with polycythemia vera, JAK2 positive.  Hct today is 43.7% so no phlebotomy needed this visit.  He will continue taking his baby aspirin daily.  We will plan to see him back in another 2 months for follow-up.  He will contact our office with any questions or concerns. We can certainly see him sooner if need be.   Laverna Peace, NP 1/21/20201:37 PM

## 2018-11-16 ENCOUNTER — Inpatient Hospital Stay (HOSPITAL_BASED_OUTPATIENT_CLINIC_OR_DEPARTMENT_OTHER): Payer: Medicare Other | Admitting: Hematology & Oncology

## 2018-11-16 ENCOUNTER — Inpatient Hospital Stay: Payer: Medicare Other

## 2018-11-16 ENCOUNTER — Inpatient Hospital Stay: Payer: Medicare Other | Attending: Hematology & Oncology

## 2018-11-16 ENCOUNTER — Other Ambulatory Visit: Payer: Self-pay

## 2018-11-16 ENCOUNTER — Encounter: Payer: Self-pay | Admitting: Hematology & Oncology

## 2018-11-16 VITALS — BP 141/70 | HR 67 | Temp 97.6°F | Resp 20 | Wt 170.1 lb

## 2018-11-16 VITALS — BP 131/61 | HR 70 | Resp 20

## 2018-11-16 DIAGNOSIS — D45 Polycythemia vera: Secondary | ICD-10-CM | POA: Insufficient documentation

## 2018-11-16 DIAGNOSIS — K255 Chronic or unspecified gastric ulcer with perforation: Secondary | ICD-10-CM | POA: Diagnosis not present

## 2018-11-16 DIAGNOSIS — Z7982 Long term (current) use of aspirin: Secondary | ICD-10-CM

## 2018-11-16 DIAGNOSIS — B9681 Helicobacter pylori [H. pylori] as the cause of diseases classified elsewhere: Secondary | ICD-10-CM | POA: Diagnosis not present

## 2018-11-16 LAB — CBC WITH DIFFERENTIAL (CANCER CENTER ONLY)
Abs Immature Granulocytes: 0.85 10*3/uL — ABNORMAL HIGH (ref 0.00–0.07)
BASOS ABS: 0.2 10*3/uL — AB (ref 0.0–0.1)
BASOS PCT: 1 %
EOS ABS: 0.2 10*3/uL (ref 0.0–0.5)
Eosinophils Relative: 1 %
HCT: 47.9 % (ref 39.0–52.0)
Hemoglobin: 13.2 g/dL (ref 13.0–17.0)
IMMATURE GRANULOCYTES: 5 %
Lymphocytes Relative: 6 %
Lymphs Abs: 1.2 10*3/uL (ref 0.7–4.0)
MCH: 21.6 pg — ABNORMAL LOW (ref 26.0–34.0)
MCHC: 27.6 g/dL — ABNORMAL LOW (ref 30.0–36.0)
MCV: 78.4 fL — AB (ref 80.0–100.0)
Monocytes Absolute: 0.8 10*3/uL (ref 0.1–1.0)
Monocytes Relative: 4 %
NEUTROS PCT: 83 %
NRBC: 0.5 % — AB (ref 0.0–0.2)
Neutro Abs: 15.6 10*3/uL — ABNORMAL HIGH (ref 1.7–7.7)
PLATELETS: 370 10*3/uL (ref 150–400)
RBC: 6.11 MIL/uL — ABNORMAL HIGH (ref 4.22–5.81)
RDW: 21.1 % — AB (ref 11.5–15.5)
WBC: 18.8 10*3/uL — AB (ref 4.0–10.5)

## 2018-11-16 LAB — CMP (CANCER CENTER ONLY)
ALBUMIN: 4.5 g/dL (ref 3.5–5.0)
ALT: 18 U/L (ref 0–44)
AST: 25 U/L (ref 15–41)
Alkaline Phosphatase: 1005 U/L — ABNORMAL HIGH (ref 38–126)
Anion gap: 8 (ref 5–15)
BUN: 20 mg/dL (ref 8–23)
CHLORIDE: 104 mmol/L (ref 98–111)
CO2: 29 mmol/L (ref 22–32)
Calcium: 9.3 mg/dL (ref 8.9–10.3)
Creatinine: 0.97 mg/dL (ref 0.61–1.24)
GFR, Est AFR Am: >60 mL/min (ref >60)
Glucose, Bld: 87 mg/dL (ref 70–99)
POTASSIUM: 4.8 mmol/L (ref 3.5–5.1)
Sodium: 141 mmol/L (ref 135–145)
Total Bilirubin: 1 mg/dL (ref 0.3–1.2)
Total Protein: 6.3 g/dL — ABNORMAL LOW (ref 6.5–8.1)

## 2018-11-16 NOTE — Patient Instructions (Signed)

## 2018-11-16 NOTE — Progress Notes (Signed)
Hematology and Oncology Follow Up Visit  Benjamin [name] [id number] [birth year] 68 y.o. 11/16/2018   Principle Diagnosis:  Polycythemia vera -- JAK2 positive. Perforated gastric ulcer-H pylori positive   Current Therapy:   Phlebotomy to maintain a Hct < 45% EC aspirin 81 mg PO daily    Interim History:  Benjamin [name] is here today for follow-up.  So far, he is doing okay.  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  He is mostly staying around home.  He and his wife did join the new fitness center at [facility].  He really enjoys it.  He was going 3 times a week.  He had a Physiological scientist that was helping him.  There is been no problems with headache.  Has had no bleeding.  There is been no problems with bowels or bladder.  He still has some pruritus after he takes a shower.  He clearly has iron deficiency.  Thankfully there is been no issues with respect to his abdomen.  He had that bleeding ulcer 3 years ago.  His alkaline phosphatase has always been incredibly high.  I think this is just a reflection of his bone marrow activity.  His last iron studies that were done on him 6 months ago showed a ferritin of 15 with an iron saturation of 5%.  Overall, his performance status is ECOG 0   Medications:  Allergies as of 11/16/2018      Reactions   Biaxin [clarithromycin] Other (See Comments)   Joint pains   Cephalexin Hives, Rash   Flagyl [metronidazole] Other (See Comments)   Joint pain   Morphine And Related Hives      Medication List       Accurate as of November 16, 2018  1:50 PM. Always use your most recent med list.        aspirin 81 MG tablet Take 81 mg by mouth every morning.   B-complex with vitamin C tablet Take 1 tablet by mouth daily.   Finacea 15 % Foam Generic drug:  Azelaic Acid   Fish Oil 1000 MG Caps Take 2 capsules by mouth daily.   Magnesium 400 MG Caps Take 1 capsule by  mouth daily.   minocycline 100 MG capsule Commonly known as:  MINOCIN,DYNACIN 100 mg daily.   sulfamethoxazole-trimethoprim 400-80 MG tablet Commonly known as:  BACTRIM,SEPTRA Take 1 tablet by mouth 2 (two) times daily.   Vitamin D 50 MCG (2000 UT) Caps Take by mouth.   Vitamin E 400 units Tabs Take 1 tablet by mouth daily.       Allergies:  Allergies  Allergen Reactions  . Biaxin [Clarithromycin] Other (See Comments)    Joint pains  . Cephalexin Hives and Rash  . Flagyl [Metronidazole] Other (See Comments)    Joint pain  . Morphine And Related Hives    Past Medical History, Surgical history, Social history, and Family History were reviewed and updated.  Review of Systems:  Review of Systems  Constitutional: Negative.   HENT: Negative.   Eyes: Negative.   Respiratory: Negative.   Cardiovascular: Negative.   Gastrointestinal: Negative.   Genitourinary: Negative.   Musculoskeletal: Negative.   Skin: Negative.   Neurological: Negative.   Endo/Heme/Allergies: Negative.   Psychiatric/Behavioral: Negative.     Physical Exam:  weight is 170 lb 1.9 oz (77.2 kg). His oral temperature is 97.6 F (36.4 C). His blood pressure is 141/70 (abnormal) and  his pulse is 67. His respiration is 20 and oxygen saturation is 100%.   Wt Readings from Last 3 Encounters:  11/16/18 170 lb 1.9 oz (77.2 kg)  09/13/18 172 lb 8 oz (78.2 kg)  07/11/18 168 lb 1.9 oz (76.3 kg)    Physical Exam Vitals signs reviewed.  HENT:     Head: Normocephalic and atraumatic.  Eyes:     Pupils: Pupils are equal, round, and reactive to light.  Neck:     Musculoskeletal: Normal range of motion.  Cardiovascular:     Rate and Rhythm: Normal rate and regular rhythm.     Heart sounds: Normal heart sounds.  Pulmonary:     Effort: Pulmonary effort is normal.     Breath sounds: Normal breath sounds.  Abdominal:     General: Bowel sounds are normal.     Palpations: Abdomen is soft.  Musculoskeletal:  Normal range of motion.        General: No tenderness or deformity.  Lymphadenopathy:     Cervical: No cervical adenopathy.  Skin:    General: Skin is warm and dry.     Findings: No erythema or rash.  Neurological:     Mental Status: He is alert and oriented to person, place, and time.  Psychiatric:        Behavior: Behavior normal.        Thought Content: Thought content normal.        Judgment: Judgment normal.      Lab Results  Component Value Date   WBC 18.8 (H) 11/16/2018   HGB 13.2 11/16/2018   HCT 47.9 11/16/2018   MCV 78.4 (L) 11/16/2018   PLT 370 11/16/2018   Lab Results  Component Value Date   FERRITIN 15 (L) 05/09/2018   IRON 17 (L) 05/09/2018   TIBC 348 05/09/2018   UIBC 331 05/09/2018   IRONPCTSAT 5 (L) 05/09/2018   Lab Results  Component Value Date   RETICCTPCT 2.7 (H) 05/09/2018   RBC 6.11 (H) 11/16/2018   RETICCTABS 173.6 02/27/2015   No results found for: KPAFRELGTCHN, LAMBDASER, KAPLAMBRATIO No results found for: IGGSERUM, IGA, IGMSERUM No results found for: Kathrynn Ducking, MSPIKE, SPEI   Chemistry      Component Value Date/Time   NA 141 09/13/2018 1307   NA 141 07/19/2017 1417   NA 141 05/14/2016 1202   K 4.2 09/13/2018 1307   K 4.6 07/19/2017 1417   K 4.8 05/14/2016 1202   CL 104 09/13/2018 1307   CL 103 07/19/2017 1417   CO2 29 09/13/2018 1307   CO2 30 07/19/2017 1417   CO2 26 05/14/2016 1202   BUN 19 09/13/2018 1307   BUN 13 07/19/2017 1417   BUN 14.0 05/14/2016 1202   CREATININE 1.02 09/13/2018 1307   CREATININE 0.9 07/19/2017 1417   CREATININE 1.0 05/14/2016 1202      Component Value Date/Time   CALCIUM 9.2 09/13/2018 1307   CALCIUM 9.1 07/19/2017 1417   CALCIUM 9.2 05/14/2016 1202   ALKPHOS 1,004 (H) 09/13/2018 1307   ALKPHOS 870 (H) 07/19/2017 1417   ALKPHOS 913 (H) 05/14/2016 1202   AST 31 09/13/2018 1307   AST 39 (H) 05/14/2016 1202   ALT 24 09/13/2018 1307   ALT 50 (H)  07/19/2017 1417   ALT 41 05/14/2016 1202   BILITOT 1.1 09/13/2018 1307   BILITOT 1.00 05/14/2016 1202      Impression and Plan: Benjamin [name] is a very pleasant 68  yo caucasian gentleman with polycythemia vera, JAK2 positive.   We will go ahead and phlebotomize him today.  Since he is here, as is there is a "stay at home" order for the area, I do want him out and about.  We will plan to get him back in another 3 months.  I think this probably would be reasonable.  Volanda Napoleon, MD 3/25/20201:50 PM

## 2018-11-16 NOTE — Addendum Note (Signed)
Addended by: Burney Gauze R on: 11/16/2018 03:07 PM   Modules accepted: Orders

## 2018-11-16 NOTE — Progress Notes (Signed)
Benjamin [name] presents today for phlebotomy per MD orders. Phlebotomy procedure started at 1420 and ended at 1430. 536 cc removedvia 16 G needle at L antecubital site. Patient tolerated procedure well.

## 2018-12-22 ENCOUNTER — Other Ambulatory Visit: Payer: Self-pay

## 2018-12-22 ENCOUNTER — Ambulatory Visit
Admission: RE | Admit: 2018-12-22 | Discharge: 2018-12-22 | Disposition: A | Discharge status: Home / Self Care | Payer: Medicare Other | Source: Ambulatory Visit | Attending: Hematology & Oncology | Admitting: Hematology & Oncology

## 2018-12-22 DIAGNOSIS — D45 Polycythemia vera: Secondary | ICD-10-CM

## 2019-01-19 ENCOUNTER — Inpatient Hospital Stay: Payer: Medicare Other

## 2019-01-19 ENCOUNTER — Encounter: Payer: Self-pay | Admitting: Hematology & Oncology

## 2019-01-19 ENCOUNTER — Inpatient Hospital Stay: Payer: Medicare Other | Attending: Hematology & Oncology | Admitting: Hematology & Oncology

## 2019-01-19 ENCOUNTER — Other Ambulatory Visit: Payer: Self-pay

## 2019-01-19 VITALS — BP 132/57 | HR 77 | Temp 97.6°F | Resp 16 | Wt 166.0 lb

## 2019-01-19 DIAGNOSIS — Z7982 Long term (current) use of aspirin: Secondary | ICD-10-CM | POA: Insufficient documentation

## 2019-01-19 DIAGNOSIS — Z79899 Other long term (current) drug therapy: Secondary | ICD-10-CM | POA: Diagnosis not present

## 2019-01-19 DIAGNOSIS — D45 Polycythemia vera: Secondary | ICD-10-CM | POA: Insufficient documentation

## 2019-01-19 LAB — CMP (CANCER CENTER ONLY)
ALT: 15 U/L (ref 0–44)
AST: 23 U/L (ref 15–41)
Albumin: 4.2 g/dL (ref 3.5–5.0)
Alkaline Phosphatase: 865 U/L — ABNORMAL HIGH (ref 38–126)
Anion gap: 8 (ref 5–15)
BUN: 20 mg/dL (ref 8–23)
CO2: 28 mmol/L (ref 22–32)
Calcium: 8.7 mg/dL — ABNORMAL LOW (ref 8.9–10.3)
Chloride: 106 mmol/L (ref 98–111)
Creatinine: 0.92 mg/dL (ref 0.61–1.24)
GFR, Est AFR Am: >60 mL/min (ref >60)
GFR, Estimated: >60 mL/min (ref >60)
Glucose, Bld: 108 mg/dL — ABNORMAL HIGH (ref 70–99)
Potassium: 4.5 mmol/L (ref 3.5–5.1)
Sodium: 142 mmol/L (ref 135–145)
Total Bilirubin: 1.2 mg/dL (ref 0.3–1.2)
Total Protein: 6.3 g/dL — ABNORMAL LOW (ref 6.5–8.1)

## 2019-01-19 LAB — CBC WITH DIFFERENTIAL (CANCER CENTER ONLY)
Abs Immature Granulocytes: 1.11 10*3/uL — ABNORMAL HIGH (ref 0.00–0.07)
Basophils Absolute: 0.3 10*3/uL — ABNORMAL HIGH (ref 0.0–0.1)
Basophils Relative: 1 %
Eosinophils Absolute: 0.2 10*3/uL (ref 0.0–0.5)
Eosinophils Relative: 1 %
HCT: 45.5 % (ref 39.0–52.0)
Hemoglobin: 12.5 g/dL — ABNORMAL LOW (ref 13.0–17.0)
Immature Granulocytes: 4 %
Lymphocytes Relative: 6 %
Lymphs Abs: 1.6 10*3/uL (ref 0.7–4.0)
MCH: 21.8 pg — ABNORMAL LOW (ref 26.0–34.0)
MCHC: 27.5 g/dL — ABNORMAL LOW (ref 30.0–36.0)
MCV: 79.4 fL — ABNORMAL LOW (ref 80.0–100.0)
Monocytes Absolute: 0.6 10*3/uL (ref 0.1–1.0)
Monocytes Relative: 3 %
Neutro Abs: 21.3 10*3/uL — ABNORMAL HIGH (ref 1.7–7.7)
Neutrophils Relative %: 85 %
Platelet Count: 431 10*3/uL — ABNORMAL HIGH (ref 150–400)
RBC: 5.73 MIL/uL (ref 4.22–5.81)
RDW: 24.3 % — ABNORMAL HIGH (ref 11.5–15.5)
WBC Count: 25.1 10*3/uL — ABNORMAL HIGH (ref 4.0–10.5)
nRBC: 0.3 % — ABNORMAL HIGH (ref 0.0–0.2)

## 2019-01-19 NOTE — Progress Notes (Signed)
Hematology and Oncology Follow Up Visit  Benjamin [name] [id number] 1950-10-06 68 y.o. 01/19/2019   Principle Diagnosis:  Polycythemia vera -- JAK2 positive. Perforated gastric ulcer-H pylori positive   Current Therapy:   Phlebotomy to maintain a Hct < 45% EC aspirin 81 mg PO daily    Interim History:  Benjamin [name] is here today for follow-up.  Unfortunately, I think that we might be seeing that his polycythemia could be coming more active.  He had a ultrasound of the abdomen done a few weeks ago.  Splenic volume is now over 2500 cc.  He says he lies down flat, he can feel a fullness in the left upper quadrant of his abdomen.  I am sure that this is his spleen.  The last time we checked his spleen which was probably a couple years ago, the surface area was 2237 cm.  I talked him about the possibility of starting Hydrea.  I think this might be something we will have to think about.  His white cell count is on the higher side now.  I have talked him in the past about Jakafi.  I think Jakafi clearly would have more side effects for him.  His appetite is okay.  He and his wife really has not done much because of the coronavirus.  He has had no problem with bowels or bladder.  He has had no fever.  He has had no cough.  He has had no bony aches or pains.  Overall, his performance status is ECOG 1.   Medications:  Allergies as of 01/19/2019      Reactions   Biaxin [clarithromycin] Other (See Comments)   Joint pains   Cephalexin Hives, Rash   Flagyl [metronidazole] Other (See Comments)   Joint pain   Morphine And Related Hives      Medication List       Accurate as of Jan 19, 2019  2:21 PM. If you have any questions, ask your nurse or doctor.        STOP taking these medications   Fish Oil 1000 MG Caps Stopped by:  Volanda Napoleon, MD   sulfamethoxazole-trimethoprim 400-80 MG tablet Commonly known as:  BACTRIM Stopped by:  Volanda Napoleon, MD     TAKE these  medications   aspirin 81 MG tablet Take 81 mg by mouth every morning.   B-complex with vitamin C tablet Take 1 tablet by mouth daily.   Finacea 15 % Foam Generic drug:  Azelaic Acid   Magnesium 400 MG Caps Take 1 capsule by mouth daily.   minocycline 100 MG capsule Commonly known as:  MINOCIN 100 mg as needed.   Vitamin D 50 MCG (2000 UT) Caps Take by mouth.   Vitamin E 400 units Tabs Take 1 tablet by mouth daily.       Allergies:  Allergies  Allergen Reactions  . Biaxin [Clarithromycin] Other (See Comments)    Joint pains  . Cephalexin Hives and Rash  . Flagyl [Metronidazole] Other (See Comments)    Joint pain  . Morphine And Related Hives    Past Medical History, Surgical history, Social history, and Family History were reviewed and updated.  Review of Systems:  Review of Systems  Constitutional: Negative.   HENT: Negative.   Eyes: Negative.   Respiratory: Negative.   Cardiovascular: Negative.   Gastrointestinal: Negative.   Genitourinary: Negative.   Musculoskeletal: Negative.   Skin: Negative.   Neurological: Negative.   Endo/Heme/Allergies: Negative.  Psychiatric/Behavioral: Negative.     Physical Exam:  weight is 166 lb (75.3 kg). His oral temperature is 97.6 F (36.4 C). His blood pressure is 132/57 (abnormal) and his pulse is 77. His respiration is 16 and oxygen saturation is 100%.   Wt Readings from Last 3 Encounters:  01/19/19 166 lb (75.3 kg)  11/16/18 170 lb 1.9 oz (77.2 kg)  09/13/18 172 lb 8 oz (78.2 kg)    Physical Exam Vitals signs reviewed.  HENT:     Head: Normocephalic and atraumatic.  Eyes:     Pupils: Pupils are equal, round, and reactive to light.  Neck:     Musculoskeletal: Normal range of motion.  Cardiovascular:     Rate and Rhythm: Normal rate and regular rhythm.     Heart sounds: Normal heart sounds.  Pulmonary:     Effort: Pulmonary effort is normal.     Breath sounds: Normal breath sounds.  Abdominal:      General: Bowel sounds are normal.     Palpations: Abdomen is soft.  Musculoskeletal: Normal range of motion.        General: No tenderness or deformity.  Lymphadenopathy:     Cervical: No cervical adenopathy.  Skin:    General: Skin is warm and dry.     Findings: No erythema or rash.  Neurological:     Mental Status: He is alert and oriented to person, place, and time.  Psychiatric:        Behavior: Behavior normal.        Thought Content: Thought content normal.        Judgment: Judgment normal.      Lab Results  Component Value Date   WBC 25.1 (H) 01/19/2019   HGB 12.5 (L) 01/19/2019   HCT 45.5 01/19/2019   MCV 79.4 (L) 01/19/2019   PLT 431 (H) 01/19/2019   Lab Results  Component Value Date   FERRITIN 15 (L) 05/09/2018   IRON 17 (L) 05/09/2018   TIBC 348 05/09/2018   UIBC 331 05/09/2018   IRONPCTSAT 5 (L) 05/09/2018   Lab Results  Component Value Date   RETICCTPCT 2.7 (H) 05/09/2018   RBC 5.73 01/19/2019   RETICCTABS 173.6 02/27/2015   No results found for: KPAFRELGTCHN, LAMBDASER, KAPLAMBRATIO No results found for: IGGSERUM, IGA, IGMSERUM No results found for: Kathrynn Ducking, MSPIKE, SPEI   Chemistry      Component Value Date/Time   NA 141 11/16/2018 1332   NA 141 07/19/2017 1417   NA 141 05/14/2016 1202   K 4.8 11/16/2018 1332   K 4.6 07/19/2017 1417   K 4.8 05/14/2016 1202   CL 104 11/16/2018 1332   CL 103 07/19/2017 1417   CO2 29 11/16/2018 1332   CO2 30 07/19/2017 1417   CO2 26 05/14/2016 1202   BUN 20 11/16/2018 1332   BUN 13 07/19/2017 1417   BUN 14.0 05/14/2016 1202   CREATININE 0.97 11/16/2018 1332   CREATININE 0.9 07/19/2017 1417   CREATININE 1.0 05/14/2016 1202      Component Value Date/Time   CALCIUM 9.3 11/16/2018 1332   CALCIUM 9.1 07/19/2017 1417   CALCIUM 9.2 05/14/2016 1202   ALKPHOS 1,005 (H) 11/16/2018 1332   ALKPHOS 870 (H) 07/19/2017 1417   ALKPHOS 913 (H) 05/14/2016 1202   AST  25 11/16/2018 1332   AST 39 (H) 05/14/2016 1202   ALT 18 11/16/2018 1332   ALT 50 (H) 07/19/2017 1417   ALT 41  05/14/2016 1202   BILITOT 1.0 11/16/2018 1332   BILITOT 1.00 05/14/2016 1202      Impression and Plan: Benjamin [name] is a very pleasant 68 yo caucasian gentleman with polycythemia vera, JAK2 positive.   He needs to be phlebotomized but he wants to hold off on this for right now.  We will try to do this next week.  I would need to see him back in about 6 weeks.  We will have to increase our treatments here follow-ups because of the potential increase in activity with the polycythemia.  Volanda Napoleon, MD 5/28/20202:21 PM

## 2019-01-20 LAB — FERRITIN: Ferritin: 27 ng/mL (ref 24–336)

## 2019-01-20 LAB — IRON AND TIBC
Iron: 26 ug/dL — ABNORMAL LOW (ref 42–163)
Saturation Ratios: 8 % — ABNORMAL LOW (ref 20–55)
TIBC: 323 ug/dL (ref 202–409)
UIBC: 297 ug/dL (ref 117–376)

## 2019-01-24 ENCOUNTER — Other Ambulatory Visit: Payer: Self-pay

## 2019-01-24 ENCOUNTER — Inpatient Hospital Stay: Payer: Medicare Other | Attending: Hematology & Oncology

## 2019-01-24 VITALS — BP 125/63 | HR 74 | Temp 97.6°F | Resp 17

## 2019-01-24 DIAGNOSIS — D45 Polycythemia vera: Secondary | ICD-10-CM | POA: Diagnosis present

## 2019-01-24 NOTE — Patient Instructions (Signed)
Therapeutic Phlebotomy Therapeutic phlebotomy is the planned removal of blood from a person's body for the purpose of treating a medical condition. The procedure is similar to donating blood. Usually, about a pint (470 mL, or 0.47 L) of blood is removed. The average adult has 9-12 pints (4.3-5.7 L) of blood in the body. Therapeutic phlebotomy may be used to treat the following medical conditions:  Hemochromatosis. This is a condition in which the blood contains too much iron.  Polycythemia vera. This is a condition in which the blood contains too many red blood cells.  Porphyria cutanea tarda. This is a disease in which an important part of hemoglobin is not made properly. It results in the buildup of abnormal amounts of porphyrins in the body.  Sickle cell disease. This is a condition in which the red blood cells form an abnormal crescent shape rather than a round shape. Tell a health care provider about:  Any allergies you have.  All medicines you are taking, including vitamins, herbs, eye drops, creams, and over-the-counter medicines.  Any problems you or family members have had with anesthetic medicines.  Any blood disorders you have.  Any surgeries you have had.  Any medical conditions you have.  Whether you are pregnant or may be pregnant. What are the risks? Generally, this is a safe procedure. However, problems may occur, including:  Nausea or light-headedness.  Low blood pressure (hypotension).  Soreness, bleeding, swelling, or bruising at the needle insertion site.  Infection. What happens before the procedure?  Follow instructions from your health care provider about eating or drinking restrictions.  Ask your health care provider about: ? Changing or stopping your regular medicines. This is especially important if you are taking diabetes medicines or blood thinners (anticoagulants). ? Taking medicines such as aspirin and ibuprofen. These medicines can thin your  blood. Do not take these medicines unless your health care provider tells you to take them. ? Taking over-the-counter medicines, vitamins, herbs, and supplements.  Wear clothing with sleeves that can be raised above the elbow.  Plan to have someone take you home from the hospital or clinic.  You may have a blood sample taken.  Your blood pressure, pulse rate, and breathing rate will be measured. What happens during the procedure?   To lower your risk of infection: ? Your health care team will wash or sanitize their hands. ? Your skin will be cleaned with an antiseptic.  You may be given a medicine to numb the area (local anesthetic).  A tourniquet will be placed on your arm.  A needle will be inserted into one of your veins.  Tubing and a collection bag will be attached to that needle.  Blood will flow through the needle and tubing into the collection bag.  The collection bag will be placed lower than your arm to allow gravity to help the flow of blood into the bag.  You may be asked to open and close your hand slowly and continually during the entire collection.  After the specified amount of blood has been removed from your body, the collection bag and tubing will be clamped.  The needle will be removed from your vein.  Pressure will be held on the site of the needle insertion to stop the bleeding.  A bandage (dressing) will be placed over the needle insertion site. The procedure may vary among health care providers and hospitals. What happens after the procedure?  Your blood pressure, pulse rate, and breathing rate will be   measured after the procedure.  You will be encouraged to drink fluids.  Your recovery will be assessed and monitored.  You can return to your normal activities as told by your health care provider. Summary  Therapeutic phlebotomy is the planned removal of blood from a person's body for the purpose of treating a medical condition.  Therapeutic  phlebotomy may be used to treat hemochromatosis, polycythemia vera, porphyria cutanea tarda, or sickle cell disease.  In the procedure, a needle is inserted and about a pint (470 mL, or 0.47 L) of blood is removed. The average adult has 9-12 pints (4.3-5.7 L) of blood in the body.  This is generally a safe procedure, but it can sometimes cause problems such as nausea, light-headedness, or low blood pressure (hypotension). This information is not intended to replace advice given to you by your health care provider. Make sure you discuss any questions you have with your health care provider. Document Released: 01/12/2011 Document Revised: 08/26/2017 Document Reviewed: 08/26/2017 Elsevier Interactive Patient Education  2019 [employer]  

## 2019-01-24 NOTE — Progress Notes (Signed)
Phlebotomy done to left ac via phlebotomy kit.  525 grams removed from 1345-1352. Pressure dressing applied.  Pt tolerated with no difficulties.  Pt had snack and drink after phlebotomy and is without complaints at time of discharge.

## 2019-02-20 ENCOUNTER — Encounter: Payer: Self-pay | Admitting: Internal Medicine

## 2019-03-02 ENCOUNTER — Other Ambulatory Visit: Payer: Self-pay

## 2019-03-02 ENCOUNTER — Encounter: Payer: Self-pay | Admitting: Hematology & Oncology

## 2019-03-02 ENCOUNTER — Inpatient Hospital Stay: Payer: Medicare Other | Attending: Hematology & Oncology | Admitting: Hematology & Oncology

## 2019-03-02 ENCOUNTER — Inpatient Hospital Stay: Payer: Medicare Other

## 2019-03-02 VITALS — BP 128/59 | HR 70 | Temp 97.3°F | Resp 19 | Wt 164.0 lb

## 2019-03-02 DIAGNOSIS — D45 Polycythemia vera: Secondary | ICD-10-CM | POA: Diagnosis present

## 2019-03-02 LAB — CBC WITH DIFFERENTIAL (CANCER CENTER ONLY)
Abs Immature Granulocytes: 1.65 10*3/uL — ABNORMAL HIGH (ref 0.00–0.07)
Basophils Absolute: 0.2 10*3/uL — ABNORMAL HIGH (ref 0.0–0.1)
Basophils Relative: 1 %
Eosinophils Absolute: 0.2 10*3/uL (ref 0.0–0.5)
Eosinophils Relative: 1 %
HCT: 45.3 % (ref 39.0–52.0)
Hemoglobin: 12.5 g/dL — ABNORMAL LOW (ref 13.0–17.0)
Immature Granulocytes: 6 %
Lymphocytes Relative: 5 %
Lymphs Abs: 1.4 10*3/uL (ref 0.7–4.0)
MCH: 21.2 pg — ABNORMAL LOW (ref 26.0–34.0)
MCHC: 27.6 g/dL — ABNORMAL LOW (ref 30.0–36.0)
MCV: 76.8 fL — ABNORMAL LOW (ref 80.0–100.0)
Monocytes Absolute: 1 10*3/uL (ref 0.1–1.0)
Monocytes Relative: 4 %
Neutro Abs: 22.7 10*3/uL — ABNORMAL HIGH (ref 1.7–7.7)
Neutrophils Relative %: 83 %
Platelet Count: 427 10*3/uL — ABNORMAL HIGH (ref 150–400)
RBC: 5.9 MIL/uL — ABNORMAL HIGH (ref 4.22–5.81)
RDW: 21.3 % — ABNORMAL HIGH (ref 11.5–15.5)
WBC Count: 27.2 10*3/uL — ABNORMAL HIGH (ref 4.0–10.5)
nRBC: 0.4 % — ABNORMAL HIGH (ref 0.0–0.2)

## 2019-03-02 LAB — CMP (CANCER CENTER ONLY)
ALT: 15 U/L (ref 0–44)
AST: 20 U/L (ref 15–41)
Albumin: 4.2 g/dL (ref 3.5–5.0)
Alkaline Phosphatase: 933 U/L — ABNORMAL HIGH (ref 38–126)
Anion gap: 8 (ref 5–15)
BUN: 21 mg/dL (ref 8–23)
CO2: 27 mmol/L (ref 22–32)
Calcium: 8.2 mg/dL — ABNORMAL LOW (ref 8.9–10.3)
Chloride: 104 mmol/L (ref 98–111)
Creatinine: 0.85 mg/dL (ref 0.61–1.24)
GFR, Est AFR Am: >60 mL/min (ref >60)
GFR, Estimated: >60 mL/min (ref >60)
Glucose, Bld: 93 mg/dL (ref 70–99)
Potassium: 4.9 mmol/L (ref 3.5–5.1)
Sodium: 139 mmol/L (ref 135–145)
Total Bilirubin: 1 mg/dL (ref 0.3–1.2)
Total Protein: 5.9 g/dL — ABNORMAL LOW (ref 6.5–8.1)

## 2019-03-02 LAB — LACTATE DEHYDROGENASE: LDH: 578 U/L — ABNORMAL HIGH (ref 98–192)

## 2019-03-02 MED ORDER — HYDROXYUREA 500 MG PO CAPS: 500.0000 mg | ORAL_CAPSULE | Freq: Every day | ORAL | 4 refills | Status: DC

## 2019-03-02 NOTE — Progress Notes (Signed)
Hematology and Oncology Follow Up Visit  KLAY SOBOTKA [id number] Nov 18, 1950 68 y.o. 03/02/2019   Principle Diagnosis:  Polycythemia vera -- JAK2 positive. Perforated gastric ulcer-H pylori positive   Current Therapy:   Phlebotomy to maintain a Hct < 45% EC aspirin 81 mg PO daily  Hydrea 500 mg po q day   Interim History:  Mr. Drummer is here today for follow-up.  He feels okay.  He has had no issues since we last saw him which was back in late May.  He is still been very cautious with the corona virus.  He is playing his banjo.  He has been doing a lot of this at home.  He has had no problems with abdominal pain.  He has had no headache.  He has had no cough or shortness of breath.  He has had no change in bowel or bladder habits.  Unfortunately, it looks like he is polycythemia is becoming more active.  His white cell count is creeping up even more.  His platelet count is also a little bit higher.  I really think that we have to get him on Hydrea.  I think that with his white cell count going up, this increases his risk of thromboembolic disease.  I would like to try to decrease that risk.  He is amenable to the Hydrea now.  I think that 500 mg p.o. daily dose would be reasonable for him.  I know this is low-dose we can adjust accordingly.  He has had no problems with mouth sores.  His appetite is doing quite well.  Overall, his performance status is ECOG 1.   Medications:  Allergies as of 03/02/2019      Reactions   Biaxin [clarithromycin] Other (See Comments)   Joint pains   Cephalexin Hives, Rash   Flagyl [metronidazole] Other (See Comments)   Joint pain   Morphine And Related Hives      Medication List       Accurate as of March 02, 2019  2:16 PM. If you have any questions, ask your nurse or doctor.        aspirin 81 MG tablet Take 81 mg by mouth every morning.   B-complex with vitamin C tablet Take 1 tablet by mouth daily.   Finacea 15 % Foam Generic  drug: Azelaic Acid   Magnesium 400 MG Caps Take 1 capsule by mouth daily.   minocycline 100 MG capsule Commonly known as: MINOCIN 100 mg as needed.   Vitamin D 50 MCG (2000 UT) Caps Take by mouth.   Vitamin E 400 units Tabs Take 1 tablet by mouth daily.       Allergies:  Allergies  Allergen Reactions  . Biaxin [Clarithromycin] Other (See Comments)    Joint pains  . Cephalexin Hives and Rash  . Flagyl [Metronidazole] Other (See Comments)    Joint pain  . Morphine And Related Hives    Past Medical History, Surgical history, Social history, and Family History were reviewed and updated.  Review of Systems:  Review of Systems  Constitutional: Negative.   HENT: Negative.   Eyes: Negative.   Respiratory: Negative.   Cardiovascular: Negative.   Gastrointestinal: Negative.   Genitourinary: Negative.   Musculoskeletal: Negative.   Skin: Negative.   Neurological: Negative.   Endo/Heme/Allergies: Negative.   Psychiatric/Behavioral: Negative.     Physical Exam:  weight is 164 lb (74.4 kg). His oral temperature is 97.3 F (36.3 C) (abnormal). His blood pressure is 128/59 (  abnormal) and his pulse is 70. His respiration is 19 and oxygen saturation is 100%.   Wt Readings from Last 3 Encounters:  03/02/19 164 lb (74.4 kg)  01/19/19 166 lb (75.3 kg)  11/16/18 170 lb 1.9 oz (77.2 kg)    Physical Exam Vitals signs reviewed.  HENT:     Head: Normocephalic and atraumatic.  Eyes:     Pupils: Pupils are equal, round, and reactive to light.  Neck:     Musculoskeletal: Normal range of motion.  Cardiovascular:     Rate and Rhythm: Normal rate and regular rhythm.     Heart sounds: Normal heart sounds.  Pulmonary:     Effort: Pulmonary effort is normal.     Breath sounds: Normal breath sounds.  Abdominal:     General: Bowel sounds are normal.     Palpations: Abdomen is soft.  Musculoskeletal: Normal range of motion.        General: No tenderness or deformity.   Lymphadenopathy:     Cervical: No cervical adenopathy.  Skin:    General: Skin is warm and dry.     Findings: No erythema or rash.  Neurological:     Mental Status: He is alert and oriented to person, place, and time.  Psychiatric:        Behavior: Behavior normal.        Thought Content: Thought content normal.        Judgment: Judgment normal.      Lab Results  Component Value Date   WBC 27.2 (H) 03/02/2019   HGB 12.5 (L) 03/02/2019   HCT 45.3 03/02/2019   MCV 76.8 (L) 03/02/2019   PLT 427 (H) 03/02/2019   Lab Results  Component Value Date   FERRITIN 27 01/19/2019   IRON 26 (L) 01/19/2019   TIBC 323 01/19/2019   UIBC 297 01/19/2019   IRONPCTSAT 8 (L) 01/19/2019   Lab Results  Component Value Date   RETICCTPCT 2.7 (H) 05/09/2018   RBC 5.90 (H) 03/02/2019   RETICCTABS 173.6 02/27/2015   No results found for: KPAFRELGTCHN, LAMBDASER, KAPLAMBRATIO No results found for: IGGSERUM, IGA, IGMSERUM No results found for: Kathrynn Ducking, MSPIKE, SPEI   Chemistry      Component Value Date/Time   NA 139 03/02/2019 1306   NA 141 07/19/2017 1417   NA 141 05/14/2016 1202   K 4.9 03/02/2019 1306   K 4.6 07/19/2017 1417   K 4.8 05/14/2016 1202   CL 104 03/02/2019 1306   CL 103 07/19/2017 1417   CO2 27 03/02/2019 1306   CO2 30 07/19/2017 1417   CO2 26 05/14/2016 1202   BUN 21 03/02/2019 1306   BUN 13 07/19/2017 1417   BUN 14.0 05/14/2016 1202   CREATININE 0.85 03/02/2019 1306   CREATININE 0.9 07/19/2017 1417   CREATININE 1.0 05/14/2016 1202      Component Value Date/Time   CALCIUM 8.2 (L) 03/02/2019 1306   CALCIUM 9.1 07/19/2017 1417   CALCIUM 9.2 05/14/2016 1202   ALKPHOS 933 (H) 03/02/2019 1306   ALKPHOS 870 (H) 07/19/2017 1417   ALKPHOS 913 (H) 05/14/2016 1202   AST 20 03/02/2019 1306   AST 39 (H) 05/14/2016 1202   ALT 15 03/02/2019 1306   ALT 50 (H) 07/19/2017 1417   ALT 41 05/14/2016 1202   BILITOT 1.0 03/02/2019  1306   BILITOT 1.00 05/14/2016 1202      Impression and Plan: Mr. Filip is a very pleasant 68  yo caucasian gentleman with polycythemia vera, JAK2 positive.   Again, we will start him on Hydrea.  I will start a 500 mg daily dose.  We will not phlebotomize him.  We will see what the Hydrea dose.  Hopefully he will also have an effect on his spleen.  We have been following Mr. Chasteen for about 15 years.  As such, this is such a long time that we have seen him that we have not needed any real intervention.  I will have him come back in 1 month.  If there is really no change in his blood counts, we will increase his Hydrea dose.  Volanda Napoleon, MD 7/9/20202:16 PM

## 2019-03-03 LAB — FERRITIN: Ferritin: 14 ng/mL — ABNORMAL LOW (ref 24–336)

## 2019-03-03 LAB — IRON AND TIBC
Iron: 17 ug/dL — ABNORMAL LOW (ref 42–163)
Saturation Ratios: 4 % — ABNORMAL LOW (ref 20–55)
TIBC: 371 ug/dL (ref 202–409)
UIBC: 355 ug/dL (ref 117–376)

## 2019-03-30 ENCOUNTER — Inpatient Hospital Stay: Payer: Medicare Other

## 2019-03-30 ENCOUNTER — Other Ambulatory Visit: Payer: Self-pay

## 2019-03-30 ENCOUNTER — Inpatient Hospital Stay: Payer: Medicare Other | Attending: Hematology & Oncology | Admitting: Family

## 2019-03-30 ENCOUNTER — Telehealth: Payer: Self-pay | Admitting: Family

## 2019-03-30 VITALS — BP 121/61 | HR 72 | Resp 18 | Wt 163.0 lb

## 2019-03-30 VITALS — BP 120/51 | HR 77

## 2019-03-30 DIAGNOSIS — D45 Polycythemia vera: Secondary | ICD-10-CM

## 2019-03-30 DIAGNOSIS — D500 Iron deficiency anemia secondary to blood loss (chronic): Secondary | ICD-10-CM | POA: Diagnosis not present

## 2019-03-30 LAB — CBC WITH DIFFERENTIAL (CANCER CENTER ONLY)
Abs Immature Granulocytes: 1.2 10*3/uL — ABNORMAL HIGH (ref 0.00–0.07)
Basophils Absolute: 0.2 10*3/uL — ABNORMAL HIGH (ref 0.0–0.1)
Basophils Relative: 1 %
Eosinophils Absolute: 0.2 10*3/uL (ref 0.0–0.5)
Eosinophils Relative: 1 %
HCT: 48.6 % (ref 39.0–52.0)
Hemoglobin: 13.4 g/dL (ref 13.0–17.0)
Immature Granulocytes: 6 %
Lymphocytes Relative: 7 %
Lymphs Abs: 1.4 10*3/uL (ref 0.7–4.0)
MCH: 21.8 pg — ABNORMAL LOW (ref 26.0–34.0)
MCHC: 27.6 g/dL — ABNORMAL LOW (ref 30.0–36.0)
MCV: 79.2 fL — ABNORMAL LOW (ref 80.0–100.0)
Monocytes Absolute: 0.7 10*3/uL (ref 0.1–1.0)
Monocytes Relative: 3 %
Neutro Abs: 17 10*3/uL — ABNORMAL HIGH (ref 1.7–7.7)
Neutrophils Relative %: 82 %
Platelet Count: 305 10*3/uL (ref 150–400)
RBC: 6.14 MIL/uL — ABNORMAL HIGH (ref 4.22–5.81)
RDW: 23.9 % — ABNORMAL HIGH (ref 11.5–15.5)
WBC Count: 20.6 10*3/uL — ABNORMAL HIGH (ref 4.0–10.5)
nRBC: 0.1 % (ref 0.0–0.2)

## 2019-03-30 LAB — CMP (CANCER CENTER ONLY)
ALT: 20 U/L (ref 0–44)
AST: 25 U/L (ref 15–41)
Albumin: 4.3 g/dL (ref 3.5–5.0)
Alkaline Phosphatase: 658 U/L — ABNORMAL HIGH (ref 38–126)
Anion gap: 8 (ref 5–15)
BUN: 18 mg/dL (ref 8–23)
CO2: 30 mmol/L (ref 22–32)
Calcium: 9.1 mg/dL (ref 8.9–10.3)
Chloride: 105 mmol/L (ref 98–111)
Creatinine: 0.92 mg/dL (ref 0.61–1.24)
GFR, Est AFR Am: >60 mL/min (ref >60)
GFR, Estimated: >60 mL/min (ref >60)
Glucose, Bld: 87 mg/dL (ref 70–99)
Potassium: 4.6 mmol/L (ref 3.5–5.1)
Sodium: 143 mmol/L (ref 135–145)
Total Bilirubin: 1.4 mg/dL — ABNORMAL HIGH (ref 0.3–1.2)
Total Protein: 6.4 g/dL — ABNORMAL LOW (ref 6.5–8.1)

## 2019-03-30 LAB — LACTATE DEHYDROGENASE: LDH: 584 U/L — ABNORMAL HIGH (ref 98–192)

## 2019-03-30 LAB — SAVE SMEAR(SSMR), FOR PROVIDER SLIDE REVIEW

## 2019-03-30 NOTE — Progress Notes (Signed)
Haroldine Laws presents today for phlebotomy per MD orders. Phlebotomy procedure started at 1410 and ended at 1418. 519 cc removed via 16 G needle at R antecubital site. Patient tolerated procedure well.

## 2019-03-30 NOTE — Progress Notes (Signed)
Hematology and Oncology Follow Up Visit  Benjamin [name] [id number] 1951/04/07 68 y.o. 03/30/2019   Principle Diagnosis:  Polycythemia vera -- JAK2 positive. Perforated gastric ulcer-H pylori positive   Current Therapy:   Phlebotomy to maintain a Hct < 45% EC aspirin 81 mg PO daily  Hydrea 500 mg po q day   Interim History:  Benjamin [name] is here today for follow-up. He is doing quite well and is tolerating Hydrea nicely.  His platelet count is now 305 and WBC count 20.6.  Hct is 48.6. He denies fatigue. He has not had any issue with infection.  No fever, chills, n/v, cough, rash, dizziness, SOB, chest pain, palpitations, abdominal pain or changes in bowel or bladder habits.  He has puffiness in the right ankle that comes and goes. He states that this seems to have improved.  No c/o pain. No numbness or tingling in his extremities.  He is taking his Aspirin 81 mg PO daily. No bleeding, bruising or petechiae.  He has maintained a good appetite and is staying well hydrated. He has eleminated beer and red meat now from his diet. His weight is stable.   ECOG Performance Status: 1 - Symptomatic but completely ambulatory  Medications:  Allergies as of 03/30/2019      Reactions   Biaxin [clarithromycin] Other (See Comments)   Joint pains   Cephalexin Hives, Rash   Flagyl [metronidazole] Other (See Comments)   Joint pain   Morphine And Related Hives      Medication List       Accurate as of March 30, 2019  1:28 PM. If you have any questions, ask your nurse or doctor.        aspirin 81 MG tablet Take 81 mg by mouth every morning.   B-complex with vitamin C tablet Take 1 tablet by mouth daily.   Finacea 15 % Foam Generic drug: Azelaic Acid   hydroxyurea 500 MG capsule Commonly known as: HYDREA Take 1 capsule (500 mg total) by mouth daily. May take with food to minimize GI side effects.   Magnesium 400 MG Caps Take 1 capsule by mouth daily.   minocycline 100 MG  capsule Commonly known as: MINOCIN 100 mg as needed.   Vitamin D 50 MCG (2000 UT) Caps Take by mouth.   Vitamin E 400 units Tabs Take 1 tablet by mouth daily.       Allergies:  Allergies  Allergen Reactions  . Biaxin [Clarithromycin] Other (See Comments)    Joint pains  . Cephalexin Hives and Rash  . Flagyl [Metronidazole] Other (See Comments)    Joint pain  . Morphine And Related Hives    Past Medical History, Surgical history, Social history, and Family History were reviewed and updated.  Review of Systems: All other 10 point review of systems is negative.   Physical Exam:  vitals were not taken for this visit.   Wt Readings from Last 3 Encounters:  03/02/19 164 lb (74.4 kg)  01/19/19 166 lb (75.3 kg)  11/16/18 170 lb 1.9 oz (77.2 kg)    Ocular: Sclerae unicteric, pupils equal, round and reactive to light Ear-nose-throat: Oropharynx clear, dentition fair Lymphatic: No cervical or supraclavicular adenopathy Lungs no rales or rhonchi, good excursion bilaterally Heart regular rate and rhythm, no murmur appreciated Abd soft, nontender, positive bowel sounds, no liver or spleen tip palpated on exam, no fluid wave  MSK no focal spinal tenderness, no joint edema Neuro: non-focal, well-oriented, appropriate affect Breasts: Deferred  Lab Results  Component Value Date   WBC 27.2 (H) 03/02/2019   HGB 12.5 (L) 03/02/2019   HCT 45.3 03/02/2019   MCV 76.8 (L) 03/02/2019   PLT 427 (H) 03/02/2019   Lab Results  Component Value Date   FERRITIN 14 (L) 03/02/2019   IRON 17 (L) 03/02/2019   TIBC 371 03/02/2019   UIBC 355 03/02/2019   IRONPCTSAT 4 (L) 03/02/2019   Lab Results  Component Value Date   RETICCTPCT 2.7 (H) 05/09/2018   RBC 5.90 (H) 03/02/2019   RETICCTABS 173.6 02/27/2015   No results found for: KPAFRELGTCHN, LAMBDASER, KAPLAMBRATIO No results found for: IGGSERUM, IGA, IGMSERUM No results found for: Kathrynn Ducking, MSPIKE, SPEI   Chemistry      Component Value Date/Time   NA 139 03/02/2019 1306   NA 141 07/19/2017 1417   NA 141 05/14/2016 1202   K 4.9 03/02/2019 1306   K 4.6 07/19/2017 1417   K 4.8 05/14/2016 1202   CL 104 03/02/2019 1306   CL 103 07/19/2017 1417   CO2 27 03/02/2019 1306   CO2 30 07/19/2017 1417   CO2 26 05/14/2016 1202   BUN 21 03/02/2019 1306   BUN 13 07/19/2017 1417   BUN 14.0 05/14/2016 1202   CREATININE 0.85 03/02/2019 1306   CREATININE 0.9 07/19/2017 1417   CREATININE 1.0 05/14/2016 1202      Component Value Date/Time   CALCIUM 8.2 (L) 03/02/2019 1306   CALCIUM 9.1 07/19/2017 1417   CALCIUM 9.2 05/14/2016 1202   ALKPHOS 933 (H) 03/02/2019 1306   ALKPHOS 870 (H) 07/19/2017 1417   ALKPHOS 913 (H) 05/14/2016 1202   AST 20 03/02/2019 1306   AST 39 (H) 05/14/2016 1202   ALT 15 03/02/2019 1306   ALT 50 (H) 07/19/2017 1417   ALT 41 05/14/2016 1202   BILITOT 1.0 03/02/2019 1306   BILITOT 1.00 05/14/2016 1202       Impression and Plan: Benjamin [name] is a very pleasant 68 yo caucasian gentleman with polycythemia vera, JAK2 positive.  We will proceed with phlebotomy today as planned for Hct of 48.6%. He will continue his same regimen with Hydrea.  We will see him back in another month for follow-up.  He will contact our office with any questions or concerns. We can certainly see him sooner if needed.   Laverna Peace, NP 8/6/20201:28 PM

## 2019-03-30 NOTE — Patient Instructions (Signed)

## 2019-03-30 NOTE — Telephone Encounter (Signed)
Appointments scheduled calendar printed per 8/6 los 

## 2019-03-31 LAB — IRON AND TIBC
Iron: 36 ug/dL — ABNORMAL LOW (ref 42–163)
Saturation Ratios: 9 % — ABNORMAL LOW (ref 20–55)
TIBC: 416 ug/dL — ABNORMAL HIGH (ref 202–409)
UIBC: 380 ug/dL — ABNORMAL HIGH (ref 117–376)

## 2019-03-31 LAB — FERRITIN: Ferritin: 20 ng/mL — ABNORMAL LOW (ref 24–336)

## 2019-04-17 ENCOUNTER — Telehealth: Payer: Self-pay | Admitting: Family

## 2019-04-17 NOTE — Telephone Encounter (Signed)
Called and LMVM for patient regarding appointments from 9/8 being moved to 9/9 due to Provider PAL

## 2019-05-02 ENCOUNTER — Other Ambulatory Visit: Payer: Medicare Other

## 2019-05-02 ENCOUNTER — Ambulatory Visit: Payer: Medicare Other | Admitting: Family

## 2019-05-03 ENCOUNTER — Other Ambulatory Visit: Payer: Medicare Other

## 2019-05-03 ENCOUNTER — Ambulatory Visit: Payer: Medicare Other | Admitting: Family

## 2019-05-05 ENCOUNTER — Inpatient Hospital Stay (HOSPITAL_BASED_OUTPATIENT_CLINIC_OR_DEPARTMENT_OTHER): Payer: Medicare Other | Admitting: Family

## 2019-05-05 ENCOUNTER — Encounter: Payer: Self-pay | Admitting: Family

## 2019-05-05 ENCOUNTER — Other Ambulatory Visit: Payer: Self-pay

## 2019-05-05 ENCOUNTER — Inpatient Hospital Stay: Payer: Medicare Other | Attending: Hematology & Oncology

## 2019-05-05 ENCOUNTER — Inpatient Hospital Stay: Payer: Medicare Other

## 2019-05-05 ENCOUNTER — Telehealth: Payer: Self-pay | Admitting: Family

## 2019-05-05 VITALS — BP 145/66 | HR 73 | Temp 97.4°F | Resp 18 | Ht 72.0 in | Wt 169.0 lb

## 2019-05-05 DIAGNOSIS — D500 Iron deficiency anemia secondary to blood loss (chronic): Secondary | ICD-10-CM

## 2019-05-05 DIAGNOSIS — D45 Polycythemia vera: Secondary | ICD-10-CM

## 2019-05-05 DIAGNOSIS — K255 Chronic or unspecified gastric ulcer with perforation: Secondary | ICD-10-CM | POA: Diagnosis not present

## 2019-05-05 DIAGNOSIS — B9681 Helicobacter pylori [H. pylori] as the cause of diseases classified elsewhere: Secondary | ICD-10-CM | POA: Insufficient documentation

## 2019-05-05 DIAGNOSIS — Z7982 Long term (current) use of aspirin: Secondary | ICD-10-CM | POA: Insufficient documentation

## 2019-05-05 LAB — CBC WITH DIFFERENTIAL (CANCER CENTER ONLY)
Abs Immature Granulocytes: 0.63 10*3/uL — ABNORMAL HIGH (ref 0.00–0.07)
Basophils Absolute: 0.1 10*3/uL (ref 0.0–0.1)
Basophils Relative: 1 %
Eosinophils Absolute: 0.2 10*3/uL (ref 0.0–0.5)
Eosinophils Relative: 1 %
HCT: 43.7 % (ref 39.0–52.0)
Hemoglobin: 12.4 g/dL — ABNORMAL LOW (ref 13.0–17.0)
Immature Granulocytes: 4 %
Lymphocytes Relative: 7 %
Lymphs Abs: 1.2 10*3/uL (ref 0.7–4.0)
MCH: 24.6 pg — ABNORMAL LOW (ref 26.0–34.0)
MCHC: 28.4 g/dL — ABNORMAL LOW (ref 30.0–36.0)
MCV: 86.5 fL (ref 80.0–100.0)
Monocytes Absolute: 0.4 10*3/uL (ref 0.1–1.0)
Monocytes Relative: 2 %
Neutro Abs: 14 10*3/uL — ABNORMAL HIGH (ref 1.7–7.7)
Neutrophils Relative %: 85 %
Platelet Count: 308 10*3/uL (ref 150–400)
RBC: 5.05 MIL/uL (ref 4.22–5.81)
RDW: 21.2 % — ABNORMAL HIGH (ref 11.5–15.5)
WBC Count: 16.6 10*3/uL — ABNORMAL HIGH (ref 4.0–10.5)
nRBC: 0.1 % (ref 0.0–0.2)

## 2019-05-05 LAB — CMP (CANCER CENTER ONLY)
ALT: 24 U/L (ref 0–44)
AST: 31 U/L (ref 15–41)
Albumin: 4.1 g/dL (ref 3.5–5.0)
Alkaline Phosphatase: 707 U/L — ABNORMAL HIGH (ref 38–126)
Anion gap: 8 (ref 5–15)
BUN: 21 mg/dL (ref 8–23)
CO2: 28 mmol/L (ref 22–32)
Calcium: 9.3 mg/dL (ref 8.9–10.3)
Chloride: 105 mmol/L (ref 98–111)
Creatinine: 0.98 mg/dL (ref 0.61–1.24)
GFR, Est AFR Am: >60 mL/min (ref >60)
GFR, Estimated: >60 mL/min (ref >60)
Glucose, Bld: 118 mg/dL — ABNORMAL HIGH (ref 70–99)
Potassium: 4.2 mmol/L (ref 3.5–5.1)
Sodium: 141 mmol/L (ref 135–145)
Total Bilirubin: 1.1 mg/dL (ref 0.3–1.2)
Total Protein: 6.1 g/dL — ABNORMAL LOW (ref 6.5–8.1)

## 2019-05-05 LAB — IRON AND TIBC
Iron: 25 ug/dL — ABNORMAL LOW (ref 42–163)
Saturation Ratios: 7 % — ABNORMAL LOW (ref 20–55)
TIBC: 361 ug/dL (ref 202–409)
UIBC: 336 ug/dL (ref 117–376)

## 2019-05-05 LAB — SAVE SMEAR(SSMR), FOR PROVIDER SLIDE REVIEW

## 2019-05-05 LAB — LACTATE DEHYDROGENASE: LDH: 579 U/L — ABNORMAL HIGH (ref 98–192)

## 2019-05-05 LAB — FERRITIN: Ferritin: 22 ng/mL — ABNORMAL LOW (ref 24–336)

## 2019-05-05 NOTE — Telephone Encounter (Signed)
Called and spoke with patient regarding appointments added per 9/11 los °

## 2019-05-05 NOTE — Progress Notes (Signed)
Hematology and Oncology Follow Up Visit  Benjamin [name] [id number] February 23, 1951 68 y.o. 05/05/2019   Principle Diagnosis:  Polycythemia vera -- JAK2 positive. Perforated gastric ulcer-H pylori positive   Current Therapy:   Phlebotomy to maintain a Hct < 45% EC aspirin 81 mg PO daily Hydrea 500 mg po q day   Interim History:  Benjamin [name] is here today for follow-up. He is doing quite well and states that since starting Hydrea he is no longer itching after a hot shower and the white rash on his face has also resolved.  His WBC count continues to improve.  He denies fatigue.  No fever, chills, n/v, cough, rash, dizziness, SOB, chest pain, palpitations, abdominal pain or changes in bowel or bladder habits.  No tenderness, numbness or tingling in his extremities.  The puffiness in his feet and ankles is unchanged. This improves when he is able to work out. He has been walking every day for exercise while the gyms have been closed.  He thinks he may have plantar fascitis in the right foot and has an appointment with his pediatrist next week.  No falls or syncope.  He has maintained a good appetite and is staying well hydrated. His weight is stable.   ECOG Performance Status: 1 - Symptomatic but completely ambulatory  Medications:  Allergies as of 05/05/2019      Reactions   Biaxin [clarithromycin] Other (See Comments)   Joint pains   Cephalexin Hives, Rash   Flagyl [metronidazole] Other (See Comments)   Joint pain   Morphine And Related Hives      Medication List       Accurate as of May 05, 2019  9:32 AM. If you have any questions, ask your nurse or doctor.        aspirin 81 MG tablet Take 81 mg by mouth every morning.   B-complex with vitamin C tablet Take 1 tablet by mouth daily.   Finacea 15 % Foam Generic drug: Azelaic Acid   hydroxyurea 500 MG capsule Commonly known as: HYDREA Take 1 capsule (500 mg total) by mouth daily. May take with food to minimize  GI side effects.   Magnesium 400 MG Caps Take 1 capsule by mouth daily.   minocycline 100 MG capsule Commonly known as: MINOCIN 100 mg as needed.   Vitamin D 50 MCG (2000 UT) Caps Take by mouth.   Vitamin E 400 units Tabs Take 1 tablet by mouth daily.       Allergies:  Allergies  Allergen Reactions  . Biaxin [Clarithromycin] Other (See Comments)    Joint pains  . Cephalexin Hives and Rash  . Flagyl [Metronidazole] Other (See Comments)    Joint pain  . Morphine And Related Hives    Past Medical History, Surgical history, Social history, and Family History were reviewed and updated.  Review of Systems: All other 10 point review of systems is negative.   Physical Exam:  height is 6' (1.829 m) and weight is 169 lb (76.7 kg). His temporal temperature is 97.4 F (36.3 C) (abnormal). His blood pressure is 145/66 (abnormal) and his pulse is 73. His respiration is 18 and oxygen saturation is 100%.   Wt Readings from Last 3 Encounters:  05/05/19 169 lb (76.7 kg)  03/30/19 163 lb (73.9 kg)  03/02/19 164 lb (74.4 kg)    Ocular: Sclerae unicteric, pupils equal, round and reactive to light Ear-nose-throat: Oropharynx clear, dentition fair Lymphatic: No cervical or supraclavicular adenopathy Lungs no rales  or rhonchi, good excursion bilaterally Heart regular rate and rhythm, no murmur appreciated Abd soft, nontender, positive bowel sounds, no liver or spleen tip palpated on exam, no fluid wave  MSK no focal spinal tenderness, no joint edema Neuro: non-focal, well-oriented, appropriate affect Breasts: Deferred   Lab Results  Component Value Date   WBC 16.6 (H) 05/05/2019   HGB 12.4 (L) 05/05/2019   HCT 43.7 05/05/2019   MCV 86.5 05/05/2019   PLT 308 05/05/2019   Lab Results  Component Value Date   FERRITIN 20 (L) 03/30/2019   IRON 36 (L) 03/30/2019   TIBC 416 (H) 03/30/2019   UIBC 380 (H) 03/30/2019   IRONPCTSAT 9 (L) 03/30/2019   Lab Results  Component Value  Date   RETICCTPCT 2.7 (H) 05/09/2018   RBC 5.05 05/05/2019   RETICCTABS 173.6 02/27/2015   No results found for: KPAFRELGTCHN, LAMBDASER, KAPLAMBRATIO No results found for: IGGSERUM, IGA, IGMSERUM No results found for: Kathrynn Ducking, MSPIKE, SPEI   Chemistry      Component Value Date/Time   NA 143 03/30/2019 1316   NA 141 07/19/2017 1417   NA 141 05/14/2016 1202   K 4.6 03/30/2019 1316   K 4.6 07/19/2017 1417   K 4.8 05/14/2016 1202   CL 105 03/30/2019 1316   CL 103 07/19/2017 1417   CO2 30 03/30/2019 1316   CO2 30 07/19/2017 1417   CO2 26 05/14/2016 1202   BUN 18 03/30/2019 1316   BUN 13 07/19/2017 1417   BUN 14.0 05/14/2016 1202   CREATININE 0.92 03/30/2019 1316   CREATININE 0.9 07/19/2017 1417   CREATININE 1.0 05/14/2016 1202      Component Value Date/Time   CALCIUM 9.1 03/30/2019 1316   CALCIUM 9.1 07/19/2017 1417   CALCIUM 9.2 05/14/2016 1202   ALKPHOS 658 (H) 03/30/2019 1316   ALKPHOS 870 (H) 07/19/2017 1417   ALKPHOS 913 (H) 05/14/2016 1202   AST 25 03/30/2019 1316   AST 39 (H) 05/14/2016 1202   ALT 20 03/30/2019 1316   ALT 50 (H) 07/19/2017 1417   ALT 41 05/14/2016 1202   BILITOT 1.4 (H) 03/30/2019 1316   BILITOT 1.00 05/14/2016 1202       Impression and Plan: Benjamin [name] is a very pleasant 68 yo caucasian gentleman with polycythemia vera, JAK2 positive.  He continues to improve and has no complaints at this time.  Hct is 43.7%. No phlebotomy needed this visit. He will continue his same regimen with Hydrea and aspirin.  We will see him back in another 6 weeks.  He will contact our office with any questions or concerns. We can certainly see her sooner if needed.   Benjamin Peace, NP 9/11/20209:32 AM

## 2019-05-16 ENCOUNTER — Other Ambulatory Visit: Payer: Self-pay

## 2019-05-16 ENCOUNTER — Ambulatory Visit: Payer: Medicare Other | Admitting: Sports Medicine

## 2019-05-16 ENCOUNTER — Ambulatory Visit: Payer: Self-pay

## 2019-05-16 VITALS — BP 126/72 | Ht 72.0 in | Wt 169.0 lb

## 2019-05-16 DIAGNOSIS — M79671 Pain in right foot: Secondary | ICD-10-CM

## 2019-05-16 MED ORDER — COLCHICINE 0.6 MG PO TABS: 0.6000 mg | ORAL_TABLET | Freq: Every day | ORAL | 0 refills | Status: DC

## 2019-05-16 NOTE — Progress Notes (Signed)
   Subjective:    Patient ID: COLLEEN LAMUNYON, male    DOB: [birth year], 68 y.o.   MRN: [id number]  HPI 68 year old male who presents for right-sided foot pain and swelling.  Patient states that he has constant bilateral foot swelling but the right is always worse than the left.  He states that usually for the last 8 years he has had a solitary episode of redness and pain developing around the base of the fifth metatarsal bone and increased swelling.  He recently underwent venous ultrasound which did not reveal any DVT.  His hematologist recommend he follow-up with [place] for further management.  Patient states that for around 2 weeks he is still able to walk but eventually the pain and redness does resolve on its own.  He does not endorse any other symptoms aside from his intermittent foot swelling and redness.  Of note the patient has a medical history significant for polycythemia vera.  He takes hydroxyurea and sees Dr. Marin Olp from hematology for this issue.   Review of Systems Per HPI    Objective:   Physical Exam Inspection: Bilateral lower extremity 2+ edema noted, right greater than left Palpation: Fluid easily palpated in peripheral tissues.  No tenderness to palpation at base fifth metatarsal.  No tenderness to palpation on the course of peroneal tendons. Range of motion: Fully intact flexion, extension, internal rotation, external rotation, eversion, inversion Strength: Fully intact to flexion, extension, internal rotation, external rotation, eversion, inversion Neurovascular: 2+ swelling as noted above.  Sensation fully intact Special test: Negative talar tilt, negative anterior drawer  Limited right foot and ankle ultrasound Notable hypoechoic areas consistent with fluid and soft tissues.  Numerous hyperechoic punctate areas noted in calcaneal cuboid, cuboid to fifth metatarsal, TMT 1 through 5 noted.  No evidence of bony erosion in these areas.  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  Patient with hyperechoic punctate areas likely consistent with crystalline deposition.  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  Patient's polycythemia vera rapid cell turnover likely causal for this.  Start patient on colchicine to help manage the symptoms as he is fortunately having very mild symptoms at this point.  Will get right ankle x-ray to further evaluate.  Patient is on hydroxyurea so we will start him off on 0.6 mg daily of colchicine given that there is a mild interaction between these 2 medications.  Plan -Start colchicine 0.6 mg daily -Patient to follow-up with hematology for CBC draw, given interaction between hydroxyurea and colchicine -Patient to follow-up in 4 to 6 weeks for recheck  Guadalupe Dawn MD PGY-3 Family Medicine Resident

## 2019-05-16 NOTE — Patient Instructions (Signed)
It was great seeing you again today! We believe that you have either gout or pseduogout of your right foot. We will start a medication called colchicine at 0.6mg  daily. Please be sure and let your hematologist as well. We will send dr. Jonette Eva a note.

## 2019-05-17 ENCOUNTER — Ambulatory Visit
Admission: RE | Admit: 2019-05-17 | Discharge: 2019-05-17 | Disposition: A | Discharge status: Home / Self Care | Payer: Medicare Other | Source: Ambulatory Visit | Attending: Sports Medicine | Admitting: Sports Medicine

## 2019-05-17 DIAGNOSIS — M79671 Pain in right foot: Secondary | ICD-10-CM

## 2019-05-18 NOTE — Progress Notes (Signed)
   Subjective:    Patient ID: Benjamin [name], male    DOB: 12-06-1950, 68 y.o.   MRN: [id number]  HPI 68 year old male who presents for right-sided foot pain and swelling.  Patient states that he has constant bilateral foot swelling but the right is always worse than the left.  He states that usually for the last 8 years he has had a solitary episode of redness and pain developing around the base of the fifth metatarsal bone and increased swelling.  He recently underwent venous ultrasound which did not reveal any DVT.  His hematologist recommend he follow-up with [place] for further management.  Patient states that for around 2 weeks he is barely able to walk but eventually the pain and redness does resolve on its own.  He does not endorse any other symptoms aside from his intermittent foot swelling and redness.  Of note the patient has a medical history significant for polycythemia vera.  He takes hydroxyurea and sees Dr. Marin Olp from hematology for this issue.   Review of Systems Per HPI    Objective:   Physical Exam  NAD BP 126/72   Ht 6' (1.829 m)   Wt 169 lb (76.7 kg)   BMI 22.92 kg/m   Inspection: Bilateral lower extremity 2+ edema noted, right greater than left Palpation: Fluid easily palpated in peripheral tissues. Entire dorsum of RT foot swollen more to lateral side.  No tenderness to palpation at base fifth metatarsal.  No tenderness to palpation on the course of peroneal tendons. Range of motion: Fully intact flexion, extension, internal rotation, external rotation, eversion, inversion Strength: Fully intact to flexion, extension, internal rotation, external rotation, eversion, inversion Neurovascular: 2+ swelling as noted above.  Sensation fully intact Special test: Negative talar tilt, negative anterior drawer  Limited right foot and ankle ultrasound Notable hypoechoic areas consistent with fluid and soft tissues.  Numerous hyperechoic punctate areas noted in calcaneal  cuboid, cuboid to fifth metatarsal, TMT 1 through 5 noted.  No evidence of bony erosion in these areas.  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 Niah Heinle, MD      Assessment & Plan:  Assessment 67 year old male who presents with intermittent redness, swelling, and pain at base of fifth metatarsal which is happened 1 or 2 times per year for the last 7 or 8 years.  Patient with hyperechoic punctate areas likely consistent with crystalline deposition.  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  Patient's polycythemia vera rapid cell turnover possibly related to this.  Start patient on colchicine to help manage the symptoms as he is fortunately having very mild symptoms at this point.  Will get right ankle x-ray to further evaluate.  Patient is on hydroxyurea so we will start him off on 0.6 mg daily of colchicine given that there is a mild interaction between these 2 medications.  Plan -Start colchicine 0.6 mg daily -Patient to follow-up with hematology for CBC draw, given interaction between hydroxyurea and colchicine -Patient to follow-up in 4 to 6 weeks for recheck  Guadalupe Dawn MD PGY-3 Family Medicine Resident  I observed and examined the patient with the resident and agree with assessment and plan.  Note reviewed and modified by me. Ila Mcgill, MD

## 2019-06-15 ENCOUNTER — Other Ambulatory Visit: Payer: Self-pay | Admitting: *Deleted

## 2019-06-15 MED ORDER — COLCHICINE 0.6 MG PO TABS: 0.6000 mg | ORAL_TABLET | Freq: Every day | ORAL | 6 refills | Status: DC

## 2019-06-16 ENCOUNTER — Inpatient Hospital Stay: Payer: Medicare Other | Attending: Hematology & Oncology

## 2019-06-16 ENCOUNTER — Encounter: Payer: Self-pay | Admitting: Family

## 2019-06-16 ENCOUNTER — Inpatient Hospital Stay (HOSPITAL_BASED_OUTPATIENT_CLINIC_OR_DEPARTMENT_OTHER): Payer: Medicare Other | Admitting: Family

## 2019-06-16 ENCOUNTER — Other Ambulatory Visit: Payer: Self-pay

## 2019-06-16 ENCOUNTER — Inpatient Hospital Stay: Payer: Medicare Other

## 2019-06-16 VITALS — BP 128/61 | HR 71 | Temp 97.9°F | Resp 18 | Wt 167.4 lb

## 2019-06-16 VITALS — BP 113/68 | HR 66 | Resp 18

## 2019-06-16 DIAGNOSIS — D45 Polycythemia vera: Secondary | ICD-10-CM

## 2019-06-16 DIAGNOSIS — Z7982 Long term (current) use of aspirin: Secondary | ICD-10-CM | POA: Diagnosis not present

## 2019-06-16 DIAGNOSIS — D500 Iron deficiency anemia secondary to blood loss (chronic): Secondary | ICD-10-CM

## 2019-06-16 DIAGNOSIS — B9681 Helicobacter pylori [H. pylori] as the cause of diseases classified elsewhere: Secondary | ICD-10-CM | POA: Diagnosis not present

## 2019-06-16 DIAGNOSIS — M7989 Other specified soft tissue disorders: Secondary | ICD-10-CM | POA: Diagnosis not present

## 2019-06-16 DIAGNOSIS — K255 Chronic or unspecified gastric ulcer with perforation: Secondary | ICD-10-CM | POA: Diagnosis not present

## 2019-06-16 LAB — CBC WITH DIFFERENTIAL (CANCER CENTER ONLY)
Abs Immature Granulocytes: 0.74 10*3/uL — ABNORMAL HIGH (ref 0.00–0.07)
Basophils Absolute: 0.2 10*3/uL — ABNORMAL HIGH (ref 0.0–0.1)
Basophils Relative: 1 %
Eosinophils Absolute: 0.1 10*3/uL (ref 0.0–0.5)
Eosinophils Relative: 1 %
HCT: 48.9 % (ref 39.0–52.0)
Hemoglobin: 13.7 g/dL (ref 13.0–17.0)
Immature Granulocytes: 4 %
Lymphocytes Relative: 7 %
Lymphs Abs: 1.4 10*3/uL (ref 0.7–4.0)
MCH: 24.6 pg — ABNORMAL LOW (ref 26.0–34.0)
MCHC: 28 g/dL — ABNORMAL LOW (ref 30.0–36.0)
MCV: 87.9 fL (ref 80.0–100.0)
Monocytes Absolute: 0.5 10*3/uL (ref 0.1–1.0)
Monocytes Relative: 3 %
Neutro Abs: 16.6 10*3/uL — ABNORMAL HIGH (ref 1.7–7.7)
Neutrophils Relative %: 84 %
Platelet Count: 338 10*3/uL (ref 150–400)
RBC: 5.56 MIL/uL (ref 4.22–5.81)
RDW: 19.4 % — ABNORMAL HIGH (ref 11.5–15.5)
WBC Count: 19.5 10*3/uL — ABNORMAL HIGH (ref 4.0–10.5)
nRBC: 0.2 % (ref 0.0–0.2)

## 2019-06-16 LAB — IRON AND TIBC
Iron: 37 ug/dL — ABNORMAL LOW (ref 42–163)
Saturation Ratios: 9 % — ABNORMAL LOW (ref 20–55)
TIBC: 419 ug/dL — ABNORMAL HIGH (ref 202–409)
UIBC: 382 ug/dL — ABNORMAL HIGH (ref 117–376)

## 2019-06-16 LAB — CMP (CANCER CENTER ONLY)
ALT: 35 U/L (ref 0–44)
AST: 42 U/L — ABNORMAL HIGH (ref 15–41)
Albumin: 4.9 g/dL (ref 3.5–5.0)
Alkaline Phosphatase: 812 U/L — ABNORMAL HIGH (ref 38–126)
Anion gap: 8 (ref 5–15)
BUN: 26 mg/dL — ABNORMAL HIGH (ref 8–23)
CO2: 29 mmol/L (ref 22–32)
Calcium: 9.9 mg/dL (ref 8.9–10.3)
Chloride: 105 mmol/L (ref 98–111)
Creatinine: 1.1 mg/dL (ref 0.61–1.24)
GFR, Est AFR Am: >60 mL/min (ref >60)
GFR, Estimated: >60 mL/min (ref >60)
Glucose, Bld: 84 mg/dL (ref 70–99)
Potassium: 5 mmol/L (ref 3.5–5.1)
Sodium: 142 mmol/L (ref 135–145)
Total Bilirubin: 1.4 mg/dL — ABNORMAL HIGH (ref 0.3–1.2)
Total Protein: 6.8 g/dL (ref 6.5–8.1)

## 2019-06-16 LAB — LACTATE DEHYDROGENASE: LDH: 764 U/L — ABNORMAL HIGH (ref 98–192)

## 2019-06-16 LAB — FERRITIN: Ferritin: 25 ng/mL (ref 24–336)

## 2019-06-16 NOTE — Progress Notes (Signed)
Benjamin [name] presents today for phlebotomy per MD orders. Phlebotomy procedure started at 1045 and ended at 1050. 525 cc removed  Via 16 G needle at R antecubital site. Patient tolerated procedure well.

## 2019-06-16 NOTE — Progress Notes (Signed)
Hematology and Oncology Follow Up Visit  Benjamin [name] [id number] 16-Oct-1950 68 y.o. 06/16/2019   Principle Diagnosis:  Polycythemia vera -- JAK2 positive. Perforated gastric ulcer-H pylori positive  Current Therapy:   Phlebotomy to maintain a Hct < 45% EC aspirin 81 mg PO daily Hydrea 500 mg po q day   Interim History:  Benjamin [name] is here today for follow-up and phlebotomy. Hct today is 48.9%. Platelet count is 338.  He is taking his Hydrea and baby aspirin daily as prescribed.  No episodes of bleeding. No bruising or petechiae.  WBC count is a bit more elevated this visit at 19.5 (up from 16.6). 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 His symptoms (pain and swelling) are improving.  No fever, chills, n/v, cough, rash, dizziness, SOB, chest pain, palpitations, abdominal pain or changes in bowel or bladder habits.  No numbness or tingling in his extremities at this time.  No falls or syncope.  He has maintained a good appetite and is hydrating well. His weight is stable.   ECOG Performance Status: 1 - Symptomatic but completely ambulatory  Medications:  Allergies as of 06/16/2019      Reactions   Aleve [naproxen Sodium] Other (See Comments)   Pt thinks it might be all NSAIDs but is unsure. Caused him to have emergency stomach surgery.   Biaxin [clarithromycin] Other (See Comments)   Joint pains   Cephalexin Hives, Rash   Flagyl [metronidazole] Other (See Comments)   Joint pain   Morphine And Related Hives      Medication List       Accurate as of June 16, 2019 11:04 AM. If you have any questions, ask your nurse or doctor.        aspirin 81 MG tablet Take 81 mg by mouth every morning.   B-complex with vitamin C tablet Take 1 tablet by mouth daily.   colchicine 0.6 MG tablet Take 1 tablet (0.6 mg total) by mouth daily.   Finacea 15 % Foam Generic drug: Azelaic Acid  hydroxyurea 500 MG capsule Commonly known as: HYDREA Take 1 capsule (500 mg total) by mouth daily. May take with food to minimize GI side effects.   Magnesium 400 MG Caps Take 1 capsule by mouth daily.   Vitamin D 50 MCG (2000 UT) Caps Take by mouth.   Vitamin E 400 units Tabs Take 1 tablet by mouth daily.       Allergies:  Allergies  Allergen Reactions  . Aleve [Naproxen Sodium] Other (See Comments)    Pt thinks it might be all NSAIDs but is unsure. Caused him to have emergency stomach surgery.  . Biaxin [Clarithromycin] Other (See Comments)    Joint pains  . Cephalexin Hives and Rash  . Flagyl [Metronidazole] Other (See Comments)    Joint pain  . Morphine And Related Hives    Past Medical History, Surgical history, Social history, and Family History were reviewed and updated.  Review of Systems: All other 10 point review of systems is negative.   Physical Exam:  weight is 167 lb 6.4 oz (75.9 kg). His temporal temperature is 97.9 F (36.6 C). His blood pressure is 128/61 and his pulse is 71. His respiration is 18 and oxygen saturation is 100%.   Wt Readings from Last 3 Encounters:  06/16/19 167 lb 6.4 oz (75.9 kg)  05/16/19 169 lb (76.7 kg)  05/05/19 169 lb (76.7  kg)    Ocular: Sclerae unicteric, pupils equal, round and reactive to light Ear-nose-throat: Oropharynx clear, dentition fair Lymphatic: No cervical or supraclavicular adenopathy Lungs no rales or rhonchi, good excursion bilaterally Heart regular rate and rhythm, no murmur appreciated Abd soft, nontender, positive bowel sounds, no liver or spleen tip palpated on exam, no fluid wave  MSK no focal spinal tenderness, no joint edema Neuro: non-focal, well-oriented, appropriate affect Breasts: Deferred   Lab Results  Component Value Date   WBC 19.5 (H) 06/16/2019   HGB 13.7 06/16/2019   HCT 48.9 06/16/2019   MCV 87.9 06/16/2019   PLT 338 06/16/2019   Lab Results  Component Value Date   FERRITIN 22  (L) 05/05/2019   IRON 25 (L) 05/05/2019   TIBC 361 05/05/2019   UIBC 336 05/05/2019   IRONPCTSAT 7 (L) 05/05/2019   Lab Results  Component Value Date   RETICCTPCT 2.7 (H) 05/09/2018   RBC 5.56 06/16/2019   RETICCTABS 173.6 02/27/2015   No results found for: KPAFRELGTCHN, LAMBDASER, KAPLAMBRATIO No results found for: IGGSERUM, IGA, IGMSERUM No results found for: Kathrynn Ducking, MSPIKE, SPEI   Chemistry      Component Value Date/Time   NA 142 06/16/2019 0936   NA 141 07/19/2017 1417   NA 141 05/14/2016 1202   K 5.0 06/16/2019 0936   K 4.6 07/19/2017 1417   K 4.8 05/14/2016 1202   CL 105 06/16/2019 0936   CL 103 07/19/2017 1417   CO2 29 06/16/2019 0936   CO2 30 07/19/2017 1417   CO2 26 05/14/2016 1202   BUN 26 (H) 06/16/2019 0936   BUN 13 07/19/2017 1417   BUN 14.0 05/14/2016 1202   CREATININE 1.10 06/16/2019 0936   CREATININE 0.9 07/19/2017 1417   CREATININE 1.0 05/14/2016 1202      Component Value Date/Time   CALCIUM 9.9 06/16/2019 0936   CALCIUM 9.1 07/19/2017 1417   CALCIUM 9.2 05/14/2016 1202   ALKPHOS 812 (H) 06/16/2019 0936   ALKPHOS 870 (H) 07/19/2017 1417   ALKPHOS 913 (H) 05/14/2016 1202   AST 42 (H) 06/16/2019 0936   AST 39 (H) 05/14/2016 1202   ALT 35 06/16/2019 0936   ALT 50 (H) 07/19/2017 1417   ALT 41 05/14/2016 1202   BILITOT 1.4 (H) 06/16/2019 0936   BILITOT 1.00 05/14/2016 1202       Impression and Plan: Benjamin [name] is a very pleasant 68 yo caucasian gentleman with polycythemia vera, JAK2 positive. We will proceed with phlebotomy today.  He will continue his same regimen with Hydrea and aspirin.  We will see him back in 4 weeks for follow-up and repeat lab work.  He will contact our office with any questions or concerns. We can certainly see him sooner if needed.   Laverna Peace, NP 10/23/202011:04 AM

## 2019-07-14 ENCOUNTER — Telehealth: Payer: Self-pay | Admitting: Hematology & Oncology

## 2019-07-14 ENCOUNTER — Inpatient Hospital Stay: Payer: Medicare Other

## 2019-07-14 ENCOUNTER — Inpatient Hospital Stay: Payer: Medicare Other | Attending: Hematology & Oncology | Admitting: Family

## 2019-07-14 ENCOUNTER — Encounter: Payer: Self-pay | Admitting: Family

## 2019-07-14 ENCOUNTER — Other Ambulatory Visit: Payer: Self-pay

## 2019-07-14 VITALS — BP 140/60 | HR 80 | Temp 96.9°F | Resp 18 | Wt 170.5 lb

## 2019-07-14 DIAGNOSIS — B9681 Helicobacter pylori [H. pylori] as the cause of diseases classified elsewhere: Secondary | ICD-10-CM | POA: Diagnosis not present

## 2019-07-14 DIAGNOSIS — D45 Polycythemia vera: Secondary | ICD-10-CM | POA: Diagnosis not present

## 2019-07-14 DIAGNOSIS — Z7982 Long term (current) use of aspirin: Secondary | ICD-10-CM | POA: Diagnosis not present

## 2019-07-14 DIAGNOSIS — K255 Chronic or unspecified gastric ulcer with perforation: Secondary | ICD-10-CM | POA: Diagnosis not present

## 2019-07-14 DIAGNOSIS — D500 Iron deficiency anemia secondary to blood loss (chronic): Secondary | ICD-10-CM

## 2019-07-14 LAB — CMP (CANCER CENTER ONLY)
ALT: 22 U/L (ref 0–44)
AST: 26 U/L (ref 15–41)
Albumin: 4.6 g/dL (ref 3.5–5.0)
Alkaline Phosphatase: 763 U/L — ABNORMAL HIGH (ref 38–126)
Anion gap: 6 (ref 5–15)
BUN: 21 mg/dL (ref 8–23)
CO2: 30 mmol/L (ref 22–32)
Calcium: 9.2 mg/dL (ref 8.9–10.3)
Chloride: 106 mmol/L (ref 98–111)
Creatinine: 1.02 mg/dL (ref 0.61–1.24)
GFR, Est AFR Am: >60 mL/min (ref >60)
GFR, Estimated: >60 mL/min (ref >60)
Glucose, Bld: 90 mg/dL (ref 70–99)
Potassium: 4.4 mmol/L (ref 3.5–5.1)
Sodium: 142 mmol/L (ref 135–145)
Total Bilirubin: 0.9 mg/dL (ref 0.3–1.2)
Total Protein: 6.5 g/dL (ref 6.5–8.1)

## 2019-07-14 LAB — CBC WITH DIFFERENTIAL (CANCER CENTER ONLY)
Abs Immature Granulocytes: 0.82 10*3/uL — ABNORMAL HIGH (ref 0.00–0.07)
Basophils Absolute: 0.1 10*3/uL (ref 0.0–0.1)
Basophils Relative: 1 %
Eosinophils Absolute: 0.2 10*3/uL (ref 0.0–0.5)
Eosinophils Relative: 1 %
HCT: 43.1 % (ref 39.0–52.0)
Hemoglobin: 12.3 g/dL — ABNORMAL LOW (ref 13.0–17.0)
Immature Granulocytes: 5 %
Lymphocytes Relative: 6 %
Lymphs Abs: 1 10*3/uL (ref 0.7–4.0)
MCH: 25.7 pg — ABNORMAL LOW (ref 26.0–34.0)
MCHC: 28.5 g/dL — ABNORMAL LOW (ref 30.0–36.0)
MCV: 90 fL (ref 80.0–100.0)
Monocytes Absolute: 0.4 10*3/uL (ref 0.1–1.0)
Monocytes Relative: 2 %
Neutro Abs: 15.2 10*3/uL — ABNORMAL HIGH (ref 1.7–7.7)
Neutrophils Relative %: 85 %
Platelet Count: 357 10*3/uL (ref 150–400)
RBC: 4.79 MIL/uL (ref 4.22–5.81)
RDW: 18.7 % — ABNORMAL HIGH (ref 11.5–15.5)
WBC Count: 17.7 10*3/uL — ABNORMAL HIGH (ref 4.0–10.5)
nRBC: 0.3 % — ABNORMAL HIGH (ref 0.0–0.2)

## 2019-07-14 NOTE — Telephone Encounter (Signed)
Appointments scheduled and L:M for patient w/ date/time per 11/20 los

## 2019-07-14 NOTE — Progress Notes (Signed)
Hematology and Oncology Follow Up Visit  Benjamin [name] [id number] 1951-04-30 68 y.o. 07/14/2019   Principle Diagnosis:  Polycythemia vera -- JAK2 positive. Perforated gastric ulcer-H pylori positive  Current Therapy:   Phlebotomy to maintain a Hct < 45% EC aspirin 81 mg PO daily Hydrea 500 mg po q day   Interim History:  Benjamin [name] is here today for follow-up. He is doing well and has no complaints at this time.  Hct is stable at 43.1%, WBC count 17.7 and platelets 357.  No fatigue.  No blood loss, bruising or petechiae.  No fever, chills, n/v, cough, rash, dizziness, SOB, chest pain, palpitations, abdominal pain or changes in bowel or bladder habits.  No swelling, tenderness, numbness or tingling in her extremities.  No falls or syncope.  He is eating and drinking well. His weight is stable.   ECOG Performance Status: 0 - Asymptomatic  Medications:  Allergies as of 07/14/2019      Reactions   Aleve [naproxen Sodium] Other (See Comments)   Pt thinks it might be all NSAIDs but is unsure. Caused him to have emergency stomach surgery.   Biaxin [clarithromycin] Other (See Comments)   Joint pains   Cephalexin Hives, Rash   Flagyl [metronidazole] Other (See Comments)   Joint pain   Morphine And Related Hives      Medication List       Accurate as of July 14, 2019  1:42 PM. If you have any questions, ask your nurse or doctor.        aspirin 81 MG tablet Take 81 mg by mouth every morning.   B-complex with vitamin C tablet Take 1 tablet by mouth daily.   colchicine 0.6 MG tablet Take 1 tablet (0.6 mg total) by mouth daily.   Finacea 15 % Foam Generic drug: Azelaic Acid   hydroxyurea 500 MG capsule Commonly known as: HYDREA Take 1 capsule (500 mg total) by mouth daily. May take with food to minimize GI side effects.   Magnesium 400 MG Caps Take 1 capsule by mouth daily.   Vitamin D 50 MCG (2000 UT) Caps Take by mouth.   Vitamin E 400 units Tabs  Take 1 tablet by mouth daily.       Allergies:  Allergies  Allergen Reactions  . Aleve [Naproxen Sodium] Other (See Comments)    Pt thinks it might be all NSAIDs but is unsure. Caused him to have emergency stomach surgery.  . Biaxin [Clarithromycin] Other (See Comments)    Joint pains  . Cephalexin Hives and Rash  . Flagyl [Metronidazole] Other (See Comments)    Joint pain  . Morphine And Related Hives    Past Medical History, Surgical history, Social history, and Family History were reviewed and updated.  Review of Systems: All other 10 point review of systems is negative.   Physical Exam:  weight is 170 lb 8 oz (77.3 kg). His temporal temperature is 96.9 F (36.1 C) (abnormal). His blood pressure is 140/60 and his pulse is 80. His respiration is 18 and oxygen saturation is 100%.   Wt Readings from Last 3 Encounters:  07/14/19 170 lb 8 oz (77.3 kg)  06/16/19 167 lb 6.4 oz (75.9 kg)  05/16/19 169 lb (76.7 kg)    Ocular: Sclerae unicteric, pupils equal, round and reactive to light Ear-nose-throat: Oropharynx clear, dentition fair Lymphatic: No cervical or supraclavicular adenopathy Lungs no rales or rhonchi, good excursion bilaterally Heart regular rate and rhythm, no murmur appreciated Abd soft,  nontender, positive bowel sounds, no liver or spleen tip palpated on exam, no fluid wave  MSK no focal spinal tenderness, no joint edema Neuro: non-focal, well-oriented, appropriate affect Breasts: Deferred   Lab Results  Component Value Date   WBC 17.7 (H) 07/14/2019   HGB 12.3 (L) 07/14/2019   HCT 43.1 07/14/2019   MCV 90.0 07/14/2019   PLT 357 07/14/2019   Lab Results  Component Value Date   FERRITIN 25 06/16/2019   IRON 37 (L) 06/16/2019   TIBC 419 (H) 06/16/2019   UIBC 382 (H) 06/16/2019   IRONPCTSAT 9 (L) 06/16/2019   Lab Results  Component Value Date   RETICCTPCT 2.7 (H) 05/09/2018   RBC 4.79 07/14/2019   RETICCTABS 173.6 02/27/2015   No results found  for: KPAFRELGTCHN, LAMBDASER, KAPLAMBRATIO No results found for: IGGSERUM, IGA, IGMSERUM No results found for: Kathrynn Ducking, MSPIKE, SPEI   Chemistry      Component Value Date/Time   NA 142 06/16/2019 0936   NA 141 07/19/2017 1417   NA 141 05/14/2016 1202   K 5.0 06/16/2019 0936   K 4.6 07/19/2017 1417   K 4.8 05/14/2016 1202   CL 105 06/16/2019 0936   CL 103 07/19/2017 1417   CO2 29 06/16/2019 0936   CO2 30 07/19/2017 1417   CO2 26 05/14/2016 1202   BUN 26 (H) 06/16/2019 0936   BUN 13 07/19/2017 1417   BUN 14.0 05/14/2016 1202   CREATININE 1.10 06/16/2019 0936   CREATININE 0.9 07/19/2017 1417   CREATININE 1.0 05/14/2016 1202      Component Value Date/Time   CALCIUM 9.9 06/16/2019 0936   CALCIUM 9.1 07/19/2017 1417   CALCIUM 9.2 05/14/2016 1202   ALKPHOS 812 (H) 06/16/2019 0936   ALKPHOS 870 (H) 07/19/2017 1417   ALKPHOS 913 (H) 05/14/2016 1202   AST 42 (H) 06/16/2019 0936   AST 39 (H) 05/14/2016 1202   ALT 35 06/16/2019 0936   ALT 50 (H) 07/19/2017 1417   ALT 41 05/14/2016 1202   BILITOT 1.4 (H) 06/16/2019 0936   BILITOT 1.00 05/14/2016 1202       Impression and Plan: Benjamin [name] is a very pleasant 68 yo caucasian gentleman with polycythemia vera, JAK2 positive. No phlebotomy needed this visit.  We will plan to see him back in another 6 weeks.  He will contact our office with any questions or concerns. We can certainly see him sooner if needed.   Laverna Peace, NP 11/20/20201:42 PM

## 2019-07-17 LAB — IRON AND TIBC
Iron: 24 ug/dL — ABNORMAL LOW (ref 42–163)
Saturation Ratios: 7 % — ABNORMAL LOW (ref 20–55)
TIBC: 343 ug/dL (ref 202–409)
UIBC: 319 ug/dL (ref 117–376)

## 2019-07-17 LAB — LACTATE DEHYDROGENASE: LDH: 714 U/L — ABNORMAL HIGH (ref 98–192)

## 2019-07-17 LAB — FERRITIN: Ferritin: 26 ng/mL (ref 24–336)

## 2019-07-31 ENCOUNTER — Other Ambulatory Visit: Payer: Self-pay | Admitting: *Deleted

## 2019-07-31 MED ORDER — HYDROXYUREA 500 MG PO CAPS: 500.0000 mg | ORAL_CAPSULE | Freq: Every day | ORAL | 4 refills | Status: DC

## 2019-08-31 ENCOUNTER — Other Ambulatory Visit: Payer: Self-pay

## 2019-08-31 ENCOUNTER — Encounter: Payer: Self-pay | Admitting: Hematology & Oncology

## 2019-08-31 ENCOUNTER — Inpatient Hospital Stay: Payer: Medicare PPO

## 2019-08-31 ENCOUNTER — Inpatient Hospital Stay (HOSPITAL_BASED_OUTPATIENT_CLINIC_OR_DEPARTMENT_OTHER): Payer: Medicare PPO | Admitting: Hematology & Oncology

## 2019-08-31 ENCOUNTER — Inpatient Hospital Stay: Payer: Medicare PPO | Attending: Hematology & Oncology

## 2019-08-31 ENCOUNTER — Telehealth: Payer: Self-pay | Admitting: Hematology & Oncology

## 2019-08-31 VITALS — BP 141/65 | HR 78 | Temp 97.6°F | Resp 18 | Wt 168.0 lb

## 2019-08-31 DIAGNOSIS — Z7982 Long term (current) use of aspirin: Secondary | ICD-10-CM | POA: Insufficient documentation

## 2019-08-31 DIAGNOSIS — K255 Chronic or unspecified gastric ulcer with perforation: Secondary | ICD-10-CM | POA: Diagnosis not present

## 2019-08-31 DIAGNOSIS — D45 Polycythemia vera: Secondary | ICD-10-CM | POA: Diagnosis not present

## 2019-08-31 DIAGNOSIS — B9681 Helicobacter pylori [H. pylori] as the cause of diseases classified elsewhere: Secondary | ICD-10-CM | POA: Insufficient documentation

## 2019-08-31 DIAGNOSIS — D500 Iron deficiency anemia secondary to blood loss (chronic): Secondary | ICD-10-CM

## 2019-08-31 LAB — CBC WITH DIFFERENTIAL (CANCER CENTER ONLY)
Abs Immature Granulocytes: 1.46 10*3/uL — ABNORMAL HIGH (ref 0.00–0.07)
Basophils Absolute: 0.3 10*3/uL — ABNORMAL HIGH (ref 0.0–0.1)
Basophils Relative: 1 %
Eosinophils Absolute: 0.2 10*3/uL (ref 0.0–0.5)
Eosinophils Relative: 1 %
HCT: 46.3 % (ref 39.0–52.0)
Hemoglobin: 13 g/dL (ref 13.0–17.0)
Immature Granulocytes: 6 %
Lymphocytes Relative: 5 %
Lymphs Abs: 1.2 10*3/uL (ref 0.7–4.0)
MCH: 25.5 pg — ABNORMAL LOW (ref 26.0–34.0)
MCHC: 28.1 g/dL — ABNORMAL LOW (ref 30.0–36.0)
MCV: 91 fL (ref 80.0–100.0)
Monocytes Absolute: 0.7 10*3/uL (ref 0.1–1.0)
Monocytes Relative: 3 %
Neutro Abs: 19.1 10*3/uL — ABNORMAL HIGH (ref 1.7–7.7)
Neutrophils Relative %: 84 %
Platelet Count: 414 10*3/uL — ABNORMAL HIGH (ref 150–400)
RBC: 5.09 MIL/uL (ref 4.22–5.81)
RDW: 18.6 % — ABNORMAL HIGH (ref 11.5–15.5)
WBC Count: 22.9 10*3/uL — ABNORMAL HIGH (ref 4.0–10.5)
nRBC: 0.2 % (ref 0.0–0.2)

## 2019-08-31 LAB — CMP (CANCER CENTER ONLY)
ALT: 18 U/L (ref 0–44)
AST: 30 U/L (ref 15–41)
Albumin: 4.7 g/dL (ref 3.5–5.0)
Alkaline Phosphatase: 763 U/L — ABNORMAL HIGH (ref 38–126)
Anion gap: 7 (ref 5–15)
BUN: 22 mg/dL (ref 8–23)
CO2: 29 mmol/L (ref 22–32)
Calcium: 9.5 mg/dL (ref 8.9–10.3)
Chloride: 103 mmol/L (ref 98–111)
Creatinine: 1.04 mg/dL (ref 0.61–1.24)
GFR, Est AFR Am: >60 mL/min (ref >60)
GFR, Estimated: >60 mL/min (ref >60)
Glucose, Bld: 114 mg/dL — ABNORMAL HIGH (ref 70–99)
Potassium: 4.4 mmol/L (ref 3.5–5.1)
Sodium: 139 mmol/L (ref 135–145)
Total Bilirubin: 1.5 mg/dL — ABNORMAL HIGH (ref 0.3–1.2)
Total Protein: 6.5 g/dL (ref 6.5–8.1)

## 2019-08-31 LAB — LACTATE DEHYDROGENASE: LDH: 1012 U/L — ABNORMAL HIGH (ref 98–192)

## 2019-08-31 NOTE — Telephone Encounter (Signed)
Appointments scheduled patient declined calendar due to My Chart per 1/7 los

## 2019-08-31 NOTE — Progress Notes (Signed)
Hematology and Oncology Follow Up Visit  Benjamin [name] [id number] Jan 02, 1951 69 y.o. 08/31/2019   Principle Diagnosis:  Polycythemia vera -- JAK2 positive. Perforated gastric ulcer-H pylori positive   Current Therapy:   Phlebotomy to maintain a Hct < 45% EC aspirin 81 mg PO daily  Hydrea 500 mg po q day   Interim History:  Benjamin [name] is here today for follow-up.  So far, he really has had no complaints.  Had a very nice Christmas and New Year's holiday.  He is having no issues with the Hydrea.  He thinks that is helping him out.  He feels that it is causing his spleen to shrink a little bit.  He is trying to exercise.  He is walking quite a bit.  He has had no abdominal pain.  He is due for a colonoscopy.  He has had no fever.  He has had no cough.  There is been no rashes.  He has had no headache.  There is been no bleeding.  His appetite has been doing pretty well.  He has had no nausea or vomiting.  Overall, his performance status is ECOG 0.    Medications:  Allergies as of 08/31/2019      Reactions   Aleve [naproxen Sodium] Other (See Comments)   Pt thinks it might be all NSAIDs but is unsure. Caused him to have emergency stomach surgery.   Biaxin [clarithromycin] Other (See Comments)   Joint pains   Cephalexin Hives, Rash   Flagyl [metronidazole] Other (See Comments)   Joint pain   Morphine And Related Hives      Medication List       Accurate as of August 31, 2019  1:00 PM. If you have any questions, ask your nurse or doctor.        aspirin 81 MG tablet Take 81 mg by mouth every morning.   B-complex with vitamin C tablet Take 1 tablet by mouth daily.   colchicine 0.6 MG tablet Take 1 tablet (0.6 mg total) by mouth daily.   Finacea 15 % Foam Generic drug: Azelaic Acid   Fish Oil 1000 MG Caps Take by mouth.   hydroxyurea 500 MG capsule Commonly known as: HYDREA Take 1 capsule (500 mg total) by mouth daily. May take with food to minimize GI  side effects.   Magnesium 400 MG Caps Take 1 capsule by mouth daily.   Vitamin D 50 MCG (2000 UT) Caps Take by mouth.   Vitamin E 400 units Tabs Take 1 tablet by mouth daily.       Allergies:  Allergies  Allergen Reactions  . Aleve [Naproxen Sodium] Other (See Comments)    Pt thinks it might be all NSAIDs but is unsure. Caused him to have emergency stomach surgery.  . Biaxin [Clarithromycin] Other (See Comments)    Joint pains  . Cephalexin Hives and Rash  . Flagyl [Metronidazole] Other (See Comments)    Joint pain  . Morphine And Related Hives    Past Medical History, Surgical history, Social history, and Family History were reviewed and updated.  Review of Systems:  Review of Systems  Constitutional: Negative.   HENT: Negative.   Eyes: Negative.   Respiratory: Negative.   Cardiovascular: Negative.   Gastrointestinal: Negative.   Genitourinary: Negative.   Musculoskeletal: Negative.   Skin: Negative.   Neurological: Negative.   Endo/Heme/Allergies: Negative.   Psychiatric/Behavioral: Negative.     Physical Exam:  weight is 168 lb (76.2 kg). His  temporal temperature is 97.6 F (36.4 C). His blood pressure is 141/65 (abnormal) and his pulse is 78. His respiration is 18 and oxygen saturation is 100%.   Wt Readings from Last 3 Encounters:  08/31/19 168 lb (76.2 kg)  07/14/19 170 lb 8 oz (77.3 kg)  06/16/19 167 lb 6.4 oz (75.9 kg)    Physical Exam Vitals reviewed.  HENT:     Head: Normocephalic and atraumatic.  Eyes:     Pupils: Pupils are equal, round, and reactive to light.  Cardiovascular:     Rate and Rhythm: Normal rate and regular rhythm.     Heart sounds: Normal heart sounds.  Pulmonary:     Effort: Pulmonary effort is normal.     Breath sounds: Normal breath sounds.  Abdominal:     General: Bowel sounds are normal.     Palpations: Abdomen is soft.  Musculoskeletal:        General: No tenderness or deformity. Normal range of motion.      Cervical back: Normal range of motion.  Lymphadenopathy:     Cervical: No cervical adenopathy.  Skin:    General: Skin is warm and dry.     Findings: No erythema or rash.  Neurological:     Mental Status: He is alert and oriented to person, place, and time.  Psychiatric:        Behavior: Behavior normal.        Thought Content: Thought content normal.        Judgment: Judgment normal.      Lab Results  Component Value Date   WBC 22.9 (H) 08/31/2019   HGB 13.0 08/31/2019   HCT 46.3 08/31/2019   MCV 91.0 08/31/2019   PLT 414 (H) 08/31/2019   Lab Results  Component Value Date   FERRITIN 26 07/14/2019   IRON 24 (L) 07/14/2019   TIBC 343 07/14/2019   UIBC 319 07/14/2019   IRONPCTSAT 7 (L) 07/14/2019   Lab Results  Component Value Date   RETICCTPCT 2.7 (H) 05/09/2018   RBC 5.09 08/31/2019   RETICCTABS 173.6 02/27/2015   No results found for: KPAFRELGTCHN, LAMBDASER, KAPLAMBRATIO No results found for: IGGSERUM, IGA, IGMSERUM No results found for: Kathrynn Ducking, MSPIKE, SPEI   Chemistry      Component Value Date/Time   NA 139 08/31/2019 1205   NA 141 07/19/2017 1417   NA 141 05/14/2016 1202   K 4.4 08/31/2019 1205   K 4.6 07/19/2017 1417   K 4.8 05/14/2016 1202   CL 103 08/31/2019 1205   CL 103 07/19/2017 1417   CO2 29 08/31/2019 1205   CO2 30 07/19/2017 1417   CO2 26 05/14/2016 1202   BUN 22 08/31/2019 1205   BUN 13 07/19/2017 1417   BUN 14.0 05/14/2016 1202   CREATININE 1.04 08/31/2019 1205   CREATININE 0.9 07/19/2017 1417   CREATININE 1.0 05/14/2016 1202      Component Value Date/Time   CALCIUM 9.5 08/31/2019 1205   CALCIUM 9.1 07/19/2017 1417   CALCIUM 9.2 05/14/2016 1202   ALKPHOS 763 (H) 08/31/2019 1205   ALKPHOS 870 (H) 07/19/2017 1417   ALKPHOS 913 (H) 05/14/2016 1202   AST 30 08/31/2019 1205   AST 39 (H) 05/14/2016 1202   ALT 18 08/31/2019 1205   ALT 50 (H) 07/19/2017 1417   ALT 41 05/14/2016 1202    BILITOT 1.5 (H) 08/31/2019 1205   BILITOT 1.00 05/14/2016 1202      Impression  and Plan: Benjamin [name] is a very pleasant 69 yo caucasian gentleman with polycythemia vera, JAK2 positive.   His blood counts are little bit on the high side.  We can increase Hydrea dose if necessary.  However, Benjamin [name] is doing okay right now and really is not that symptomatic.  He will need to be phlebotomized.  We will do this next week.  I will plan to see him back in 6 weeks.  Volanda Napoleon, MD 1/7/20211:00 PM

## 2019-09-01 LAB — IRON AND TIBC
Iron: 43 ug/dL (ref 42–163)
Saturation Ratios: 11 % — ABNORMAL LOW (ref 20–55)
TIBC: 398 ug/dL (ref 202–409)
UIBC: 355 ug/dL (ref 117–376)

## 2019-09-01 LAB — FERRITIN: Ferritin: 34 ng/mL (ref 24–336)

## 2019-09-08 ENCOUNTER — Other Ambulatory Visit: Payer: Self-pay

## 2019-09-08 ENCOUNTER — Inpatient Hospital Stay: Payer: Medicare PPO

## 2019-09-08 VITALS — BP 128/55 | HR 74 | Temp 97.3°F

## 2019-09-08 DIAGNOSIS — D45 Polycythemia vera: Secondary | ICD-10-CM | POA: Diagnosis not present

## 2019-09-08 NOTE — Progress Notes (Signed)
Benjamin [name] presents today for phlebotomy per MD orders. Phlebotomy procedure started at 1337 and ended at 1341. 525 cc removed via 16 G needle at [facility] site. Patient tolerated procedure well.

## 2019-10-05 ENCOUNTER — Ambulatory Visit: Payer: Medicare PPO | Attending: Internal Medicine

## 2019-10-05 DIAGNOSIS — Z23 Encounter for immunization: Secondary | ICD-10-CM | POA: Insufficient documentation

## 2019-10-05 NOTE — Progress Notes (Signed)
   Covid-19 Vaccination Clinic  Name:  Benjamin [name]    MRN: [id number] DOB: May 25, 1951  10/05/2019  [name] was observed post Covid-19 immunization for 15 minutes without incidence. He was provided with Vaccine Information Sheet and instruction to access the V-Safe system.   [name] was instructed to call 911 with any severe reactions post vaccine: Marland Kitchen Difficulty breathing  . Swelling of your face and throat  . A fast heartbeat  . A bad rash all over your body  . Dizziness and weakness    Immunizations Administered    Name Date Dose VIS Date Route   Pfizer COVID-19 Vaccine 10/05/2019  8:46 AM 0.3 mL 08/04/2019 Intramuscular   Manufacturer: [place]   Lot: [id number]   [place]: [id number]

## 2019-10-11 ENCOUNTER — Telehealth: Payer: Self-pay | Admitting: Hematology & Oncology

## 2019-10-11 NOTE — Telephone Encounter (Signed)
Called and spoke with patient regarding moving 2/18 appointments he was ok with new date/time

## 2019-10-12 ENCOUNTER — Ambulatory Visit: Payer: Medicare PPO | Admitting: Hematology & Oncology

## 2019-10-12 ENCOUNTER — Other Ambulatory Visit: Payer: Medicare PPO

## 2019-10-20 ENCOUNTER — Inpatient Hospital Stay: Payer: Medicare PPO

## 2019-10-20 ENCOUNTER — Inpatient Hospital Stay (HOSPITAL_BASED_OUTPATIENT_CLINIC_OR_DEPARTMENT_OTHER): Payer: Medicare PPO | Admitting: Hematology & Oncology

## 2019-10-20 ENCOUNTER — Inpatient Hospital Stay: Payer: Medicare PPO | Attending: Hematology & Oncology

## 2019-10-20 ENCOUNTER — Encounter: Payer: Self-pay | Admitting: Hematology & Oncology

## 2019-10-20 ENCOUNTER — Other Ambulatory Visit: Payer: Self-pay

## 2019-10-20 VITALS — BP 133/72 | HR 73 | Temp 96.0°F | Resp 18 | Wt 174.0 lb

## 2019-10-20 DIAGNOSIS — D45 Polycythemia vera: Secondary | ICD-10-CM | POA: Insufficient documentation

## 2019-10-20 LAB — CBC WITH DIFFERENTIAL (CANCER CENTER ONLY)
Abs Immature Granulocytes: 0.95 10*3/uL — ABNORMAL HIGH (ref 0.00–0.07)
Basophils Absolute: 0.2 10*3/uL — ABNORMAL HIGH (ref 0.0–0.1)
Basophils Relative: 1 %
Eosinophils Absolute: 0.1 10*3/uL (ref 0.0–0.5)
Eosinophils Relative: 1 %
HCT: 43.6 % (ref 39.0–52.0)
Hemoglobin: 12.3 g/dL — ABNORMAL LOW (ref 13.0–17.0)
Immature Granulocytes: 5 %
Lymphocytes Relative: 6 %
Lymphs Abs: 1 10*3/uL (ref 0.7–4.0)
MCH: 26.7 pg (ref 26.0–34.0)
MCHC: 28.2 g/dL — ABNORMAL LOW (ref 30.0–36.0)
MCV: 94.6 fL (ref 80.0–100.0)
Monocytes Absolute: 0.5 10*3/uL (ref 0.1–1.0)
Monocytes Relative: 3 %
Neutro Abs: 15.3 10*3/uL — ABNORMAL HIGH (ref 1.7–7.7)
Neutrophils Relative %: 84 %
Platelet Count: 335 10*3/uL (ref 150–400)
RBC: 4.61 MIL/uL (ref 4.22–5.81)
RDW: 19.4 % — ABNORMAL HIGH (ref 11.5–15.5)
WBC Count: 18 10*3/uL — ABNORMAL HIGH (ref 4.0–10.5)
nRBC: 0.3 % — ABNORMAL HIGH (ref 0.0–0.2)

## 2019-10-20 LAB — CMP (CANCER CENTER ONLY)
ALT: 22 U/L (ref 0–44)
AST: 24 U/L (ref 15–41)
Albumin: 4.5 g/dL (ref 3.5–5.0)
Alkaline Phosphatase: 728 U/L — ABNORMAL HIGH (ref 38–126)
Anion gap: 8 (ref 5–15)
BUN: 23 mg/dL (ref 8–23)
CO2: 29 mmol/L (ref 22–32)
Calcium: 9.5 mg/dL (ref 8.9–10.3)
Chloride: 104 mmol/L (ref 98–111)
Creatinine: 1.01 mg/dL (ref 0.61–1.24)
GFR, Est AFR Am: >60 mL/min (ref >60)
GFR, Estimated: >60 mL/min (ref >60)
Glucose, Bld: 166 mg/dL — ABNORMAL HIGH (ref 70–99)
Potassium: 4.5 mmol/L (ref 3.5–5.1)
Sodium: 141 mmol/L (ref 135–145)
Total Bilirubin: 1 mg/dL (ref 0.3–1.2)
Total Protein: 6.4 g/dL — ABNORMAL LOW (ref 6.5–8.1)

## 2019-10-20 LAB — SAVE SMEAR(SSMR), FOR PROVIDER SLIDE REVIEW

## 2019-10-20 NOTE — Progress Notes (Signed)
Hematology and Oncology Follow Up Visit  Benjamin [name] [id number] June 15, 1951 69 y.o. 10/20/2019   Principle Diagnosis:  Polycythemia vera -- JAK2 positive. Perforated gastric ulcer-H pylori positive   Current Therapy:   Phlebotomy to maintain a Hct < 45% EC aspirin 81 mg PO daily  Hydrea 500 mg po q day   Interim History:  Benjamin [name] is here today for follow-up.  Overall, he is doing quite well right now.  He really has done well with the Hydrea.  His spleen seems to be improving in size.  His blood counts are also improving.  He has had no problems with fever.  He has had his first coronavirus vaccine.  There is been no change in bowel or bladder habits.  He has had no bleeding.  He has had no bruising.  There is been no pruritus.  He has little bit of chronic swelling about the right ankle.  This is not new.  He does take colchicine on occasion.  He has had no headache.  Overall, his performance status is ECOG 0.    Medications:  Allergies as of 10/20/2019      Reactions   Aleve [naproxen Sodium] Other (See Comments)   Pt thinks it might be all NSAIDs but is unsure. Caused him to have emergency stomach surgery.   Biaxin [clarithromycin] Other (See Comments)   Joint pains   Cephalexin Hives, Rash   Flagyl [metronidazole] Other (See Comments)   Joint pain   Morphine And Related Hives      Medication List       Accurate as of October 20, 2019 12:27 PM. If you have any questions, ask your nurse or doctor.        STOP taking these medications   B-complex with vitamin C tablet Stopped by: Volanda Napoleon, MD   Finacea 15 % Foam Generic drug: Azelaic Acid Stopped by: Volanda Napoleon, MD   Fish Oil 1000 MG Caps Stopped by: Volanda Napoleon, MD   Vitamin E 400 units Tabs Stopped by: Volanda Napoleon, MD     TAKE these medications   aspirin 81 MG tablet Take 81 mg by mouth every morning.   colchicine 0.6 MG tablet Take 1 tablet (0.6 mg total) by mouth  daily.   hydroxyurea 500 MG capsule Commonly known as: HYDREA Take 1 capsule (500 mg total) by mouth daily. May take with food to minimize GI side effects.   Magnesium 400 MG Caps Take 1 capsule by mouth daily.   Vitamin D 50 MCG (2000 UT) Caps Take by mouth.       Allergies:  Allergies  Allergen Reactions  . Aleve [Naproxen Sodium] Other (See Comments)    Pt thinks it might be all NSAIDs but is unsure. Caused him to have emergency stomach surgery.  . Biaxin [Clarithromycin] Other (See Comments)    Joint pains  . Cephalexin Hives and Rash  . Flagyl [Metronidazole] Other (See Comments)    Joint pain  . Morphine And Related Hives    Past Medical History, Surgical history, Social history, and Family History were reviewed and updated.  Review of Systems:  Review of Systems  Constitutional: Negative.   HENT: Negative.   Eyes: Negative.   Respiratory: Negative.   Cardiovascular: Negative.   Gastrointestinal: Negative.   Genitourinary: Negative.   Musculoskeletal: Negative.   Skin: Negative.   Neurological: Negative.   Endo/Heme/Allergies: Negative.   Psychiatric/Behavioral: Negative.     Physical Exam:  weight is 174 lb (78.9 kg). His temporal temperature is 96 F (35.6 C) (abnormal). His blood pressure is 133/72 and his pulse is 73. His respiration is 18 and oxygen saturation is 100%.   Wt Readings from Last 3 Encounters:  10/20/19 174 lb (78.9 kg)  08/31/19 168 lb (76.2 kg)  07/14/19 170 lb 8 oz (77.3 kg)    Physical Exam Vitals reviewed.  HENT:     Head: Normocephalic and atraumatic.  Eyes:     Pupils: Pupils are equal, round, and reactive to light.  Cardiovascular:     Rate and Rhythm: Normal rate and regular rhythm.     Heart sounds: Normal heart sounds.  Pulmonary:     Effort: Pulmonary effort is normal.     Breath sounds: Normal breath sounds.  Abdominal:     General: Bowel sounds are normal.     Palpations: Abdomen is soft.  Musculoskeletal:         General: No tenderness or deformity. Normal range of motion.     Cervical back: Normal range of motion.  Lymphadenopathy:     Cervical: No cervical adenopathy.  Skin:    General: Skin is warm and dry.     Findings: No erythema or rash.  Neurological:     Mental Status: He is alert and oriented to person, place, and time.  Psychiatric:        Behavior: Behavior normal.        Thought Content: Thought content normal.        Judgment: Judgment normal.      Lab Results  Component Value Date   WBC 18.0 (H) 10/20/2019   HGB 12.3 (L) 10/20/2019   HCT 43.6 10/20/2019   MCV 94.6 10/20/2019   PLT 335 10/20/2019   Lab Results  Component Value Date   FERRITIN 34 08/31/2019   IRON 43 08/31/2019   TIBC 398 08/31/2019   UIBC 355 08/31/2019   IRONPCTSAT 11 (L) 08/31/2019   Lab Results  Component Value Date   RETICCTPCT 2.7 (H) 05/09/2018   RBC 4.61 10/20/2019   RETICCTABS 173.6 02/27/2015   No results found for: KPAFRELGTCHN, LAMBDASER, KAPLAMBRATIO No results found for: IGGSERUM, IGA, IGMSERUM No results found for: Kathrynn Ducking, MSPIKE, SPEI   Chemistry      Component Value Date/Time   NA 141 10/20/2019 1124   NA 141 07/19/2017 1417   NA 141 05/14/2016 1202   K 4.5 10/20/2019 1124   K 4.6 07/19/2017 1417   K 4.8 05/14/2016 1202   CL 104 10/20/2019 1124   CL 103 07/19/2017 1417   CO2 29 10/20/2019 1124   CO2 30 07/19/2017 1417   CO2 26 05/14/2016 1202   BUN 23 10/20/2019 1124   BUN 13 07/19/2017 1417   BUN 14.0 05/14/2016 1202   CREATININE 1.01 10/20/2019 1124   CREATININE 0.9 07/19/2017 1417   CREATININE 1.0 05/14/2016 1202      Component Value Date/Time   CALCIUM 9.5 10/20/2019 1124   CALCIUM 9.1 07/19/2017 1417   CALCIUM 9.2 05/14/2016 1202   ALKPHOS 728 (H) 10/20/2019 1124   ALKPHOS 870 (H) 07/19/2017 1417   ALKPHOS 913 (H) 05/14/2016 1202   AST 24 10/20/2019 1124   AST 39 (H) 05/14/2016 1202   ALT 22  10/20/2019 1124   ALT 50 (H) 07/19/2017 1417   ALT 41 05/14/2016 1202   BILITOT 1.0 10/20/2019 1124   BILITOT 1.00 05/14/2016 1202  Impression and Plan: Benjamin [name] is a very pleasant 69 yo caucasian gentleman with polycythemia vera, JAK2 positive.   I think that the Hydrea certainly has been a bonus for him.  He is still on a very low dose.  He does not need to be phlebotomized.  We will still plan for a 6-week follow-up for him.  Volanda Napoleon, MD 2/26/202112:27 PM

## 2019-10-23 LAB — IRON AND TIBC
Iron: 23 ug/dL — ABNORMAL LOW (ref 42–163)
Saturation Ratios: 6 % — ABNORMAL LOW (ref 20–55)
TIBC: 393 ug/dL (ref 202–409)
UIBC: 369 ug/dL (ref 117–376)

## 2019-10-23 LAB — FERRITIN: Ferritin: 33 ng/mL (ref 24–336)

## 2019-10-23 LAB — LACTATE DEHYDROGENASE: LDH: 854 U/L — ABNORMAL HIGH (ref 98–192)

## 2019-11-01 ENCOUNTER — Ambulatory Visit: Payer: Medicare PPO | Attending: Internal Medicine

## 2019-11-01 DIAGNOSIS — Z23 Encounter for immunization: Secondary | ICD-10-CM | POA: Insufficient documentation

## 2019-11-01 NOTE — Progress Notes (Signed)
   Covid-19 Vaccination Clinic  Name:  Benjamin [name]    MRN: [id number] DOB: 1950/12/27  11/01/2019  Benjamin [name] was observed post Covid-19 immunization for 15 minutes without incident. He was provided with Vaccine Information Sheet and instruction to access the V-Safe system.   Benjamin [name] was instructed to call 911 with any severe reactions post vaccine: Marland Kitchen Difficulty breathing  . Swelling of face and throat  . A fast heartbeat  . A bad rash all over body  . Dizziness and weakness   Immunizations Administered    Name Date Dose VIS Date Route   Pfizer COVID-19 Vaccine 11/01/2019 11:09 AM 0.3 mL 08/04/2019 Intramuscular   Manufacturer: [place]   Lot: [id number]   [place]: [id number]

## 2019-12-04 ENCOUNTER — Inpatient Hospital Stay: Payer: Medicare PPO | Attending: Hematology & Oncology

## 2019-12-04 ENCOUNTER — Telehealth: Payer: Self-pay | Admitting: Hematology & Oncology

## 2019-12-04 ENCOUNTER — Encounter: Payer: Self-pay | Admitting: Hematology & Oncology

## 2019-12-04 ENCOUNTER — Other Ambulatory Visit: Payer: Self-pay

## 2019-12-04 ENCOUNTER — Inpatient Hospital Stay: Payer: Medicare PPO

## 2019-12-04 ENCOUNTER — Inpatient Hospital Stay (HOSPITAL_BASED_OUTPATIENT_CLINIC_OR_DEPARTMENT_OTHER): Payer: Medicare PPO | Admitting: Hematology & Oncology

## 2019-12-04 VITALS — BP 137/69 | HR 68 | Temp 96.8°F | Resp 18 | Wt 167.0 lb

## 2019-12-04 DIAGNOSIS — Z7982 Long term (current) use of aspirin: Secondary | ICD-10-CM | POA: Diagnosis not present

## 2019-12-04 DIAGNOSIS — D45 Polycythemia vera: Secondary | ICD-10-CM | POA: Diagnosis not present

## 2019-12-04 LAB — CBC WITH DIFFERENTIAL (CANCER CENTER ONLY)
Abs Immature Granulocytes: 1.29 10*3/uL — ABNORMAL HIGH (ref 0.00–0.07)
Basophils Absolute: 0.2 10*3/uL — ABNORMAL HIGH (ref 0.0–0.1)
Basophils Relative: 1 %
Eosinophils Absolute: 0.1 10*3/uL (ref 0.0–0.5)
Eosinophils Relative: 1 %
HCT: 43.7 % (ref 39.0–52.0)
Hemoglobin: 12.7 g/dL — ABNORMAL LOW (ref 13.0–17.0)
Immature Granulocytes: 8 %
Lymphocytes Relative: 8 %
Lymphs Abs: 1.3 10*3/uL (ref 0.7–4.0)
MCH: 29.2 pg (ref 26.0–34.0)
MCHC: 29.1 g/dL — ABNORMAL LOW (ref 30.0–36.0)
MCV: 100.5 fL — ABNORMAL HIGH (ref 80.0–100.0)
Monocytes Absolute: 0.8 10*3/uL (ref 0.1–1.0)
Monocytes Relative: 4 %
Neutro Abs: 13.4 10*3/uL — ABNORMAL HIGH (ref 1.7–7.7)
Neutrophils Relative %: 78 %
Platelet Count: 294 10*3/uL (ref 150–400)
RBC: 4.35 MIL/uL (ref 4.22–5.81)
RDW: 20.9 % — ABNORMAL HIGH (ref 11.5–15.5)
WBC Count: 17.1 10*3/uL — ABNORMAL HIGH (ref 4.0–10.5)
nRBC: 0.4 % — ABNORMAL HIGH (ref 0.0–0.2)

## 2019-12-04 LAB — CMP (CANCER CENTER ONLY)
ALT: 12 U/L (ref 0–44)
AST: 25 U/L (ref 15–41)
Albumin: 4.2 g/dL (ref 3.5–5.0)
Alkaline Phosphatase: 666 U/L — ABNORMAL HIGH (ref 38–126)
Anion gap: 6 (ref 5–15)
BUN: 27 mg/dL — ABNORMAL HIGH (ref 8–23)
CO2: 29 mmol/L (ref 22–32)
Calcium: 9.2 mg/dL (ref 8.9–10.3)
Chloride: 105 mmol/L (ref 98–111)
Creatinine: 0.97 mg/dL (ref 0.61–1.24)
GFR, Est AFR Am: >60 mL/min (ref >60)
GFR, Estimated: >60 mL/min (ref >60)
Glucose, Bld: 81 mg/dL (ref 70–99)
Potassium: 4.5 mmol/L (ref 3.5–5.1)
Sodium: 140 mmol/L (ref 135–145)
Total Bilirubin: 1.3 mg/dL — ABNORMAL HIGH (ref 0.3–1.2)
Total Protein: 6 g/dL — ABNORMAL LOW (ref 6.5–8.1)

## 2019-12-04 LAB — SAVE SMEAR(SSMR), FOR PROVIDER SLIDE REVIEW

## 2019-12-04 LAB — LACTATE DEHYDROGENASE: LDH: 1063 U/L — ABNORMAL HIGH (ref 98–192)

## 2019-12-04 MED ORDER — HYDROXYUREA 500 MG PO CAPS: 500.0000 mg | ORAL_CAPSULE | Freq: Every day | ORAL | 3 refills | Status: DC

## 2019-12-04 NOTE — Telephone Encounter (Signed)
Appointments scheduled calendar printed per 4/12 los °

## 2019-12-04 NOTE — Progress Notes (Signed)
Hematology and Oncology Follow Up Visit  Benjamin [name] [id number] 10-Aug-1951 69 y.o. 12/04/2019   Principle Diagnosis:  Polycythemia vera -- JAK2 positive. Perforated gastric ulcer-H pylori positive   Current Therapy:   Phlebotomy to maintain a Hct < 45% EC aspirin 81 mg PO daily  Hydrea 500 mg po q day   Interim History:  Benjamin [name] is here today for follow-up.  Overall, he is doing quite well right now.  He really has done well with the Hydrea.  His spleen seems to be improving in size.  His blood counts are also improving.  He has had no problems with fever.  He has had his  coronavirus vaccines.  He had no problems with the vaccinations.  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  This is certainly a big issue and wonderful activity for him.  It looks like he may have little bit of gout with the right foot.  He takes colchicine.  This seems to work very nicely.  Overall, his performance status is ECOG 0.    Medications:  Allergies as of 12/04/2019      Reactions   Aleve [naproxen Sodium] Other (See Comments)   Pt thinks it might be all NSAIDs but is unsure. Caused him to have emergency stomach surgery.   Biaxin [clarithromycin] Other (See Comments)   Joint pains   Cephalexin Hives, Rash   Flagyl [metronidazole] Other (See Comments)   Joint pain   Morphine And Related Hives      Medication List       Accurate as of December 04, 2019 12:13 PM. If you have any questions, ask your nurse or doctor.        aspirin 81 MG tablet Take 81 mg by mouth every morning.   colchicine 0.6 MG tablet Take 1 tablet (0.6 mg total) by mouth daily.   hydroxyurea 500 MG capsule Commonly known as: HYDREA Take 1 capsule (500 mg total) by mouth daily. May take with food to minimize GI side effects.   Magnesium 400 MG Caps Take 1 capsule by mouth daily.   Vitamin D 50 MCG (2000 UT) Caps Take by mouth.       Allergies:  Allergies   Allergen Reactions  . Aleve [Naproxen Sodium] Other (See Comments)    Pt thinks it might be all NSAIDs but is unsure. Caused him to have emergency stomach surgery.  . Biaxin [Clarithromycin] Other (See Comments)    Joint pains  . Cephalexin Hives and Rash  . Flagyl [Metronidazole] Other (See Comments)    Joint pain  . Morphine And Related Hives    Past Medical History, Surgical history, Social history, and Family History were reviewed and updated.  Review of Systems:  Review of Systems  Constitutional: Negative.   HENT: Negative.   Eyes: Negative.   Respiratory: Negative.   Cardiovascular: Negative.   Gastrointestinal: Negative.   Genitourinary: Negative.   Musculoskeletal: Negative.   Skin: Negative.   Neurological: Negative.   Endo/Heme/Allergies: Negative.   Psychiatric/Behavioral: Negative.     Physical Exam:  weight is 167 lb (75.8 kg). His temporal temperature is 96.8 F (36 C) (abnormal). His blood pressure is 137/69 and his pulse is 68. His respiration is 18 and oxygen saturation is 100%.   Wt Readings from Last 3 Encounters:  12/04/19 167 lb (75.8 kg)  10/20/19 174 lb (78.9 kg)  08/31/19 168 lb (76.2 kg)    Physical Exam  Vitals reviewed.  HENT:     Head: Normocephalic and atraumatic.  Eyes:     Pupils: Pupils are equal, round, and reactive to light.  Cardiovascular:     Rate and Rhythm: Normal rate and regular rhythm.     Heart sounds: Normal heart sounds.  Pulmonary:     Effort: Pulmonary effort is normal.     Breath sounds: Normal breath sounds.  Abdominal:     General: Bowel sounds are normal.     Palpations: Abdomen is soft.  Musculoskeletal:        General: No tenderness or deformity. Normal range of motion.     Cervical back: Normal range of motion.  Lymphadenopathy:     Cervical: No cervical adenopathy.  Skin:    General: Skin is warm and dry.     Findings: No erythema or rash.  Neurological:     Mental Status: He is alert and oriented  to person, place, and time.  Psychiatric:        Behavior: Behavior normal.        Thought Content: Thought content normal.        Judgment: Judgment normal.      Lab Results  Component Value Date   WBC 17.1 (H) 12/04/2019   HGB 12.7 (L) 12/04/2019   HCT 43.7 12/04/2019   MCV 100.5 (H) 12/04/2019   PLT 294 12/04/2019   Lab Results  Component Value Date   FERRITIN 33 10/20/2019   IRON 23 (L) 10/20/2019   TIBC 393 10/20/2019   UIBC 369 10/20/2019   IRONPCTSAT 6 (L) 10/20/2019   Lab Results  Component Value Date   RETICCTPCT 2.7 (H) 05/09/2018   RBC 4.35 12/04/2019   RETICCTABS 173.6 02/27/2015   No results found for: KPAFRELGTCHN, LAMBDASER, KAPLAMBRATIO No results found for: IGGSERUM, IGA, IGMSERUM No results found for: Kathrynn Ducking, MSPIKE, SPEI   Chemistry      Component Value Date/Time   NA 141 10/20/2019 1124   NA 141 07/19/2017 1417   NA 141 05/14/2016 1202   K 4.5 10/20/2019 1124   K 4.6 07/19/2017 1417   K 4.8 05/14/2016 1202   CL 104 10/20/2019 1124   CL 103 07/19/2017 1417   CO2 29 10/20/2019 1124   CO2 30 07/19/2017 1417   CO2 26 05/14/2016 1202   BUN 23 10/20/2019 1124   BUN 13 07/19/2017 1417   BUN 14.0 05/14/2016 1202   CREATININE 1.01 10/20/2019 1124   CREATININE 0.9 07/19/2017 1417   CREATININE 1.0 05/14/2016 1202      Component Value Date/Time   CALCIUM 9.5 10/20/2019 1124   CALCIUM 9.1 07/19/2017 1417   CALCIUM 9.2 05/14/2016 1202   ALKPHOS 728 (H) 10/20/2019 1124   ALKPHOS 870 (H) 07/19/2017 1417   ALKPHOS 913 (H) 05/14/2016 1202   AST 24 10/20/2019 1124   AST 39 (H) 05/14/2016 1202   ALT 22 10/20/2019 1124   ALT 50 (H) 07/19/2017 1417   ALT 41 05/14/2016 1202   BILITOT 1.0 10/20/2019 1124   BILITOT 1.00 05/14/2016 1202      Impression and Plan: Benjamin [name] is a very pleasant 69 yo caucasian gentleman with polycythemia vera, JAK2 positive.   I think that the Hydrea certainly has  been a bonus for him.  He is still on a very low dose.  We will do an ultrasound of his spleen so we can see him how well it is improved.  I actively  that his spleen is half the size now.  He does not need to be phlebotomized.  We will still plan for a 6-week follow-up for him.  Volanda Napoleon, MD 4/12/202112:13 PM

## 2019-12-05 LAB — IRON AND TIBC
Iron: 41 ug/dL — ABNORMAL LOW (ref 42–163)
Saturation Ratios: 11 % — ABNORMAL LOW (ref 20–55)
TIBC: 355 ug/dL (ref 202–409)
UIBC: 315 ug/dL (ref 117–376)

## 2019-12-05 LAB — FERRITIN: Ferritin: 54 ng/mL (ref 24–336)

## 2019-12-11 ENCOUNTER — Other Ambulatory Visit: Payer: Self-pay

## 2019-12-11 ENCOUNTER — Ambulatory Visit (HOSPITAL_COMMUNITY)
Admission: RE | Admit: 2019-12-11 | Discharge: 2019-12-11 | Disposition: A | Discharge status: Home / Self Care | Payer: Medicare PPO | Source: Ambulatory Visit | Attending: Hematology & Oncology | Admitting: Hematology & Oncology

## 2019-12-11 DIAGNOSIS — K8020 Calculus of gallbladder without cholecystitis without obstruction: Secondary | ICD-10-CM | POA: Diagnosis not present

## 2019-12-11 DIAGNOSIS — D45 Polycythemia vera: Secondary | ICD-10-CM

## 2019-12-11 DIAGNOSIS — R161 Splenomegaly, not elsewhere classified: Secondary | ICD-10-CM | POA: Diagnosis not present

## 2019-12-12 ENCOUNTER — Other Ambulatory Visit: Payer: Self-pay

## 2019-12-12 ENCOUNTER — Telehealth: Payer: Self-pay | Admitting: *Deleted

## 2019-12-12 MED ORDER — COLCHICINE 0.6 MG PO TABS: 0.6000 mg | ORAL_TABLET | Freq: Every day | ORAL | 6 refills | Status: DC

## 2019-12-12 NOTE — Telephone Encounter (Signed)
-----   Message from Volanda Napoleon, MD sent at 12/11/2019  4:44 PM EDT ----- Call - the spleen is decreasing nicely!!  It is now 1600 cc and was 2500cc!!!  The Hydrea is working nicely!!  Laurey Arrow

## 2019-12-12 NOTE — Telephone Encounter (Signed)
Patient notified per order of Dr. Marin Olp that "the spleen is decreasing nicely!!  It is now 1600 cc and was 2500 cc!!!  The Hydrea is working nicely[employer] of call and has no questions or concerns at this time.

## 2020-01-15 ENCOUNTER — Inpatient Hospital Stay: Payer: Medicare PPO | Attending: Hematology & Oncology

## 2020-01-15 ENCOUNTER — Inpatient Hospital Stay (HOSPITAL_BASED_OUTPATIENT_CLINIC_OR_DEPARTMENT_OTHER): Payer: Medicare PPO | Admitting: Hematology & Oncology

## 2020-01-15 ENCOUNTER — Other Ambulatory Visit: Payer: Self-pay

## 2020-01-15 VITALS — BP 139/62 | HR 74 | Temp 96.6°F | Resp 18 | Wt 166.0 lb

## 2020-01-15 DIAGNOSIS — D45 Polycythemia vera: Secondary | ICD-10-CM | POA: Insufficient documentation

## 2020-01-15 DIAGNOSIS — F520 Hypoactive sexual desire disorder: Secondary | ICD-10-CM | POA: Diagnosis not present

## 2020-01-15 DIAGNOSIS — B9681 Helicobacter pylori [H. pylori] as the cause of diseases classified elsewhere: Secondary | ICD-10-CM | POA: Insufficient documentation

## 2020-01-15 DIAGNOSIS — K255 Chronic or unspecified gastric ulcer with perforation: Secondary | ICD-10-CM | POA: Diagnosis not present

## 2020-01-15 LAB — CBC WITH DIFFERENTIAL (CANCER CENTER ONLY)
Abs Immature Granulocytes: 2.6 10*3/uL — ABNORMAL HIGH (ref 0.00–0.07)
Basophils Absolute: 0.2 10*3/uL — ABNORMAL HIGH (ref 0.0–0.1)
Basophils Relative: 1 %
Eosinophils Absolute: 0.2 10*3/uL (ref 0.0–0.5)
Eosinophils Relative: 1 %
HCT: 35.8 % — ABNORMAL LOW (ref 39.0–52.0)
Hemoglobin: 10.8 g/dL — ABNORMAL LOW (ref 13.0–17.0)
Lymphocytes Relative: 7 %
Lymphs Abs: 1.7 10*3/uL (ref 0.7–4.0)
MCH: 34.6 pg — ABNORMAL HIGH (ref 26.0–34.0)
MCHC: 30.2 g/dL (ref 30.0–36.0)
MCV: 114.7 fL — ABNORMAL HIGH (ref 80.0–100.0)
Metamyelocytes Relative: 7 %
Monocytes Absolute: 0.9 10*3/uL (ref 0.1–1.0)
Monocytes Relative: 4 %
Myelocytes: 4 %
Neutro Abs: 17.9 10*3/uL — ABNORMAL HIGH (ref 1.7–7.7)
Neutrophils Relative %: 76 %
Platelet Count: 328 10*3/uL (ref 150–400)
RBC: 3.12 MIL/uL — ABNORMAL LOW (ref 4.22–5.81)
RDW: 21.2 % — ABNORMAL HIGH (ref 11.5–15.5)
WBC Count: 23.6 10*3/uL — ABNORMAL HIGH (ref 4.0–10.5)
nRBC: 0.2 % (ref 0.0–0.2)

## 2020-01-15 LAB — CMP (CANCER CENTER ONLY)
ALT: 15 U/L (ref 0–44)
AST: 27 U/L (ref 15–41)
Albumin: 4.2 g/dL (ref 3.5–5.0)
Alkaline Phosphatase: 676 U/L — ABNORMAL HIGH (ref 38–126)
Anion gap: 6 (ref 5–15)
BUN: 22 mg/dL (ref 8–23)
CO2: 29 mmol/L (ref 22–32)
Calcium: 9.4 mg/dL (ref 8.9–10.3)
Chloride: 107 mmol/L (ref 98–111)
Creatinine: 0.95 mg/dL (ref 0.61–1.24)
GFR, Est AFR Am: >60 mL/min (ref >60)
GFR, Estimated: >60 mL/min (ref >60)
Glucose, Bld: 99 mg/dL (ref 70–99)
Potassium: 5.1 mmol/L (ref 3.5–5.1)
Sodium: 142 mmol/L (ref 135–145)
Total Bilirubin: 1.2 mg/dL (ref 0.3–1.2)
Total Protein: 6.4 g/dL — ABNORMAL LOW (ref 6.5–8.1)

## 2020-01-15 LAB — RETICULOCYTES
Immature Retic Fract: 29 % — ABNORMAL HIGH (ref 2.3–15.9)
RBC.: 3.06 MIL/uL — ABNORMAL LOW (ref 4.22–5.81)
Retic Count, Absolute: 184.5 10*3/uL (ref 19.0–186.0)
Retic Ct Pct: 6 % — ABNORMAL HIGH (ref 0.4–3.1)

## 2020-01-15 NOTE — Progress Notes (Signed)
Hematology and Oncology Follow Up Visit  Benjamin [name] [id number] [birth year] 69 y.o. 01/15/2020   Principle Diagnosis:  Polycythemia vera -- JAK2 positive. Perforated gastric ulcer-H pylori positive   Current Therapy:   Phlebotomy to maintain a Hct < 45% EC aspirin 81 mg PO daily  Hydrea 500 mg po q day   Interim History:  Benjamin [name] is here today for follow-up.  Overall, he is doing quite well right now.  He really has done well with the Hydrea.  His spleen seems to be improving in size.  We last did his ultrasound of the spleen back in April, the spleen size by decreased by about 30%.  He says that he can feel that his spleen is improved.  He says he is having some night sweats.  We will go ahead and check a testosterone level on him.  I noted that his hemoglobin and hematocrit are down a little bit more than they should be.  This could be from the [facility].  The MCV is quite high.  I will also have to check a vitamin B12 level on him.  The one thing that I do worry about is a possibility of transformation of polycythemia to a secondary myelofibrosis.  There is been no problems with rashes.  He has had no issues with cough or shortness of breath.  He does have some chronic swelling in his right foot/ankle.  Overall, I would say his performance status is ECOG 1.    Medications:  Allergies as of 01/15/2020      Reactions   Aleve [naproxen Sodium] Other (See Comments)   Pt thinks it might be all NSAIDs but is unsure. Caused him to have emergency stomach surgery.   Biaxin [clarithromycin] Other (See Comments)   Joint pains   Cephalexin Hives, Rash   Flagyl [metronidazole] Other (See Comments)   Joint pain   Morphine And Related Hives      Medication List       Accurate as of Jan 15, 2020  4:08 PM. If you have any questions, ask your nurse or doctor.        aspirin 81 MG tablet Take 81 mg by mouth every morning.   colchicine 0.6 MG tablet Take 1 tablet (0.6 mg  total) by mouth daily. What changed: Another medication with the same name was removed. Continue taking this medication, and follow the directions you see here. Changed by: Volanda Napoleon, MD   hydroxyurea 500 MG capsule Commonly known as: HYDREA Take 1 capsule (500 mg total) by mouth daily. May take with food to minimize GI side effects.   Magnesium 400 MG Caps Take 1 capsule by mouth daily.   Vitamin D 50 MCG (2000 UT) Caps Take by mouth.       Allergies:  Allergies  Allergen Reactions  . Aleve [Naproxen Sodium] Other (See Comments)    Pt thinks it might be all NSAIDs but is unsure. Caused him to have emergency stomach surgery.  . Biaxin [Clarithromycin] Other (See Comments)    Joint pains  . Cephalexin Hives and Rash  . Flagyl [Metronidazole] Other (See Comments)    Joint pain  . Morphine And Related Hives    Past Medical History, Surgical history, Social history, and Family History were reviewed and updated.  Review of Systems:  Review of Systems  Constitutional: Negative.   HENT: Negative.   Eyes: Negative.   Respiratory: Negative.   Cardiovascular: Negative.   Gastrointestinal: Negative.  Genitourinary: Negative.   Musculoskeletal: Negative.   Skin: Negative.   Neurological: Negative.   Endo/Heme/Allergies: Negative.   Psychiatric/Behavioral: Negative.     Physical Exam:  weight is 166 lb (75.3 kg). His temporal temperature is 96.6 F (35.9 C) (abnormal). His blood pressure is 139/62 and his pulse is 74. His respiration is 18 and oxygen saturation is 100%.   Wt Readings from Last 3 Encounters:  01/15/20 166 lb (75.3 kg)  12/04/19 167 lb (75.8 kg)  10/20/19 174 lb (78.9 kg)    Physical Exam Vitals reviewed.  HENT:     Head: Normocephalic and atraumatic.  Eyes:     Pupils: Pupils are equal, round, and reactive to light.  Cardiovascular:     Rate and Rhythm: Normal rate and regular rhythm.     Heart sounds: Normal heart sounds.  Pulmonary:      Effort: Pulmonary effort is normal.     Breath sounds: Normal breath sounds.  Abdominal:     General: Bowel sounds are normal.     Palpations: Abdomen is soft.  Musculoskeletal:        General: No tenderness or deformity. Normal range of motion.     Cervical back: Normal range of motion.  Lymphadenopathy:     Cervical: No cervical adenopathy.  Skin:    General: Skin is warm and dry.     Findings: No erythema or rash.  Neurological:     Mental Status: He is alert and oriented to person, place, and time.  Psychiatric:        Behavior: Behavior normal.        Thought Content: Thought content normal.        Judgment: Judgment normal.      Lab Results  Component Value Date   WBC 23.6 (H) 01/15/2020   HGB 10.8 (L) 01/15/2020   HCT 35.8 (L) 01/15/2020   MCV 114.7 (H) 01/15/2020   PLT 328 01/15/2020   Lab Results  Component Value Date   FERRITIN 54 12/04/2019   IRON 41 (L) 12/04/2019   TIBC 355 12/04/2019   UIBC 315 12/04/2019   IRONPCTSAT 11 (L) 12/04/2019   Lab Results  Component Value Date   RETICCTPCT 6.0 (H) 01/15/2020   RBC 3.06 (L) 01/15/2020   RETICCTABS 173.6 02/27/2015   No results found for: KPAFRELGTCHN, LAMBDASER, KAPLAMBRATIO No results found for: IGGSERUM, IGA, IGMSERUM No results found for: Odetta Pink, SPEI   Chemistry      Component Value Date/Time   NA 142 01/15/2020 1523   NA 141 07/19/2017 1417   NA 141 05/14/2016 1202   K 5.1 01/15/2020 1523   K 4.6 07/19/2017 1417   K 4.8 05/14/2016 1202   CL 107 01/15/2020 1523   CL 103 07/19/2017 1417   CO2 29 01/15/2020 1523   CO2 30 07/19/2017 1417   CO2 26 05/14/2016 1202   BUN 22 01/15/2020 1523   BUN 13 07/19/2017 1417   BUN 14.0 05/14/2016 1202   CREATININE 0.95 01/15/2020 1523   CREATININE 0.9 07/19/2017 1417   CREATININE 1.0 05/14/2016 1202      Component Value Date/Time   CALCIUM 9.4 01/15/2020 1523   CALCIUM 9.1 07/19/2017 1417    CALCIUM 9.2 05/14/2016 1202   ALKPHOS 676 (H) 01/15/2020 1523   ALKPHOS 870 (H) 07/19/2017 1417   ALKPHOS 913 (H) 05/14/2016 1202   AST 27 01/15/2020 1523   AST 39 (H) 05/14/2016 1202   ALT  15 01/15/2020 1523   ALT 50 (H) 07/19/2017 1417   ALT 41 05/14/2016 1202   BILITOT 1.2 01/15/2020 1523   BILITOT 1.00 05/14/2016 1202      Impression and Plan: Mr. Augello is a very pleasant 69 yo caucasian gentleman with polycythemia vera, JAK2 positive.   I believe we have to be cautious right now.  Again we just really need to make sure that there is no transformation that is trying to occur.  I am going to have him come back in about 4 weeks.  I will check a vitamin B12 level on him.  I think if his hemoglobin drops further, we may have to consider a bone marrow biopsy on him.  I am glad that his quality of life is still doing quite well.  He is exercising quite a bit.  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    Volanda Napoleon, MD 5/24/20214:08 PM

## 2020-01-16 ENCOUNTER — Encounter: Payer: Self-pay | Admitting: *Deleted

## 2020-01-16 LAB — FERRITIN: Ferritin: 277 ng/mL (ref 24–336)

## 2020-01-16 LAB — LACTATE DEHYDROGENASE: LDH: 1310 U/L — ABNORMAL HIGH (ref 98–192)

## 2020-01-16 LAB — IRON AND TIBC
Iron: 73 ug/dL (ref 42–163)
Saturation Ratios: 28 % (ref 20–55)
TIBC: 263 ug/dL (ref 202–409)
UIBC: 190 ug/dL (ref 117–376)

## 2020-01-16 LAB — TESTOSTERONE: Testosterone: 223 ng/dL — ABNORMAL LOW (ref 264–916)

## 2020-01-16 MED ORDER — TESTOSTERONE 25 MG/2.5GM (1%) TD GEL: 25.0000 mg | Freq: Every day | TRANSDERMAL | 3 refills | Status: DC

## 2020-01-16 NOTE — Addendum Note (Signed)
Addended by: Burney Gauze R on: 01/16/2020 07:19 AM   Modules accepted: Orders

## 2020-02-02 ENCOUNTER — Other Ambulatory Visit: Payer: Self-pay | Admitting: *Deleted

## 2020-02-02 NOTE — Progress Notes (Unsigned)
c 

## 2020-02-06 ENCOUNTER — Encounter: Payer: Self-pay | Admitting: *Deleted

## 2020-02-06 DIAGNOSIS — E349 Endocrine disorder, unspecified: Secondary | ICD-10-CM | POA: Insufficient documentation

## 2020-02-07 ENCOUNTER — Telehealth: Payer: Self-pay | Admitting: *Deleted

## 2020-02-07 NOTE — Telephone Encounter (Signed)
Pt called to check on the status of his Testosterone prescription PA.  Pt notified that his insurance has denied the testosterone and that an appeal is in process.  Pt appreciative of information and has no further questions at this time.

## 2020-02-12 ENCOUNTER — Other Ambulatory Visit: Payer: Self-pay

## 2020-02-12 ENCOUNTER — Encounter: Payer: Self-pay | Admitting: Hematology & Oncology

## 2020-02-12 ENCOUNTER — Inpatient Hospital Stay: Payer: Medicare PPO | Attending: Hematology & Oncology

## 2020-02-12 ENCOUNTER — Inpatient Hospital Stay (HOSPITAL_BASED_OUTPATIENT_CLINIC_OR_DEPARTMENT_OTHER): Payer: Medicare PPO | Admitting: Hematology & Oncology

## 2020-02-12 ENCOUNTER — Inpatient Hospital Stay: Payer: Medicare PPO

## 2020-02-12 ENCOUNTER — Other Ambulatory Visit: Payer: Self-pay | Admitting: *Deleted

## 2020-02-12 VITALS — BP 146/54 | HR 88 | Temp 96.6°F | Resp 16 | Wt 167.0 lb

## 2020-02-12 DIAGNOSIS — R5383 Other fatigue: Secondary | ICD-10-CM | POA: Insufficient documentation

## 2020-02-12 DIAGNOSIS — B9681 Helicobacter pylori [H. pylori] as the cause of diseases classified elsewhere: Secondary | ICD-10-CM | POA: Insufficient documentation

## 2020-02-12 DIAGNOSIS — D45 Polycythemia vera: Secondary | ICD-10-CM

## 2020-02-12 DIAGNOSIS — Z7982 Long term (current) use of aspirin: Secondary | ICD-10-CM | POA: Insufficient documentation

## 2020-02-12 DIAGNOSIS — K255 Chronic or unspecified gastric ulcer with perforation: Secondary | ICD-10-CM | POA: Diagnosis not present

## 2020-02-12 DIAGNOSIS — R531 Weakness: Secondary | ICD-10-CM | POA: Insufficient documentation

## 2020-02-12 LAB — CMP (CANCER CENTER ONLY)
ALT: 22 U/L (ref 0–44)
AST: 51 U/L — ABNORMAL HIGH (ref 15–41)
Albumin: 3.8 g/dL (ref 3.5–5.0)
Alkaline Phosphatase: 522 U/L — ABNORMAL HIGH (ref 38–126)
Anion gap: 8 (ref 5–15)
BUN: 19 mg/dL (ref 8–23)
CO2: 26 mmol/L (ref 22–32)
Calcium: 9.2 mg/dL (ref 8.9–10.3)
Chloride: 106 mmol/L (ref 98–111)
Creatinine: 1.05 mg/dL (ref 0.61–1.24)
GFR, Est AFR Am: >60 mL/min (ref >60)
GFR, Estimated: >60 mL/min (ref >60)
Glucose, Bld: 119 mg/dL — ABNORMAL HIGH (ref 70–99)
Potassium: 4.5 mmol/L (ref 3.5–5.1)
Sodium: 140 mmol/L (ref 135–145)
Total Bilirubin: 2.2 mg/dL — ABNORMAL HIGH (ref 0.3–1.2)
Total Protein: 6.4 g/dL — ABNORMAL LOW (ref 6.5–8.1)

## 2020-02-12 LAB — RETICULOCYTES
Immature Retic Fract: 35.2 % — ABNORMAL HIGH (ref 2.3–15.9)
RBC.: 1.99 MIL/uL — ABNORMAL LOW (ref 4.22–5.81)
Retic Count, Absolute: 229.2 10*3/uL — ABNORMAL HIGH (ref 19.0–186.0)
Retic Ct Pct: 11.5 % — ABNORMAL HIGH (ref 0.4–3.1)

## 2020-02-12 LAB — CBC WITH DIFFERENTIAL (CANCER CENTER ONLY)
Abs Immature Granulocytes: 3.82 10*3/uL — ABNORMAL HIGH (ref 0.00–0.07)
Basophils Absolute: 0.3 10*3/uL — ABNORMAL HIGH (ref 0.0–0.1)
Basophils Relative: 1 %
Eosinophils Absolute: 0.2 10*3/uL (ref 0.0–0.5)
Eosinophils Relative: 1 %
HCT: 24.9 % — ABNORMAL LOW (ref 39.0–52.0)
Hemoglobin: 7.2 g/dL — ABNORMAL LOW (ref 13.0–17.0)
Immature Granulocytes: 16 %
Lymphocytes Relative: 11 %
Lymphs Abs: 2.7 10*3/uL (ref 0.7–4.0)
MCH: 36 pg — ABNORMAL HIGH (ref 26.0–34.0)
MCHC: 28.9 g/dL — ABNORMAL LOW (ref 30.0–36.0)
MCV: 124.5 fL — ABNORMAL HIGH (ref 80.0–100.0)
Monocytes Absolute: 1.2 10*3/uL — ABNORMAL HIGH (ref 0.1–1.0)
Monocytes Relative: 5 %
Neutro Abs: 16.3 10*3/uL — ABNORMAL HIGH (ref 1.7–7.7)
Neutrophils Relative %: 66 %
Platelet Count: 210 10*3/uL (ref 150–400)
RBC: 2 MIL/uL — ABNORMAL LOW (ref 4.22–5.81)
RDW: 23.9 % — ABNORMAL HIGH (ref 11.5–15.5)
WBC Count: 24.4 10*3/uL — ABNORMAL HIGH (ref 4.0–10.5)
nRBC: 1.8 % — ABNORMAL HIGH (ref 0.0–0.2)

## 2020-02-12 LAB — VITAMIN B12: Vitamin B-12: 616 pg/mL (ref 180–914)

## 2020-02-12 LAB — ABO/RH: ABO/RH(D): B POS

## 2020-02-12 LAB — PREPARE RBC (CROSSMATCH)

## 2020-02-12 LAB — SAVE SMEAR(SSMR), FOR PROVIDER SLIDE REVIEW

## 2020-02-12 NOTE — Progress Notes (Signed)
Hematology and Oncology Follow Up Visit  Benjamin [name] [id number] 03-22-1951 69 y.o. 02/12/2020   Principle Diagnosis:  Polycythemia vera -- JAK2 positive. Perforated gastric ulcer-H pylori positive   Current Therapy:   Phlebotomy to maintain a Hct < 45% EC aspirin 81 mg PO daily  Hydrea 500 mg po q day   Interim History:  Benjamin [name] is here today for follow-up.  Unfortunately, I suspect we probably have a problem now.  His hemoglobin is dropped 7.2.  His hematocrit is 24.2.  The MCV is now up to 124.  I checked a rectal exam on him.  His stool was heme negative.  He does feel tired.  He does not have as much energy.  He has not noticed any obvious bleeding.  He has had been having some night sweats.  He has had no fever.  On his exam, his spleen certainly feels a lot larger.  I looked at his blood smear under the microscope.  I see some very immature appearing white blood cells.  They almost look like blasts.  I do not see any Auer rods.  As it is suppose that the cells could be myelocytes.  He has nucleated red cells.  He clearly is going to need to have a bone marrow biopsy done now.  He has had polycythemia for 15 years.  I suspect that he might be trying to transform over to maybe myelofibrosis.  Overall, I would say his performance status is ECOG 1.    Medications:  Allergies as of 02/12/2020      Reactions   Aleve [naproxen Sodium] Other (See Comments)   Pt thinks it might be all NSAIDs but is unsure. Caused him to have emergency stomach surgery.   Biaxin [clarithromycin] Other (See Comments)   Joint pains   Cephalexin Hives, Rash   Flagyl [metronidazole] Other (See Comments)   Joint pain   Morphine And Related Hives      Medication List       Accurate as of February 12, 2020  2:26 PM. If you have any questions, ask your nurse or doctor.        aspirin 81 MG tablet Take 81 mg by mouth every morning.   colchicine 0.6 MG tablet Take 1 tablet (0.6 mg total)  by mouth daily.   hydroxyurea 500 MG capsule Commonly known as: HYDREA Take 1 capsule (500 mg total) by mouth daily. May take with food to minimize GI side effects.   Magnesium 400 MG Caps Take 1 capsule by mouth daily.   Testosterone 25 MG/2.5GM (1%) Gel Commonly known as: AndroGel Place 25 mg onto the skin daily.   Vitamin D 50 MCG (2000 UT) Caps Take by mouth.       Allergies:  Allergies  Allergen Reactions  . Aleve [Naproxen Sodium] Other (See Comments)    Pt thinks it might be all NSAIDs but is unsure. Caused him to have emergency stomach surgery.  . Biaxin [Clarithromycin] Other (See Comments)    Joint pains  . Cephalexin Hives and Rash  . Flagyl [Metronidazole] Other (See Comments)    Joint pain  . Morphine And Related Hives    Past Medical History, Surgical history, Social history, and Family History were reviewed and updated.  Review of Systems:  Review of Systems  Constitutional: Negative.   HENT: Negative.   Eyes: Negative.   Respiratory: Negative.   Cardiovascular: Negative.   Gastrointestinal: Negative.   Genitourinary: Negative.   Musculoskeletal: Negative.  Skin: Negative.   Neurological: Negative.   Endo/Heme/Allergies: Negative.   Psychiatric/Behavioral: Negative.     Physical Exam:  weight is 167 lb (75.8 kg). His temporal temperature is 96.6 F (35.9 C) (abnormal). His blood pressure is 146/54 (abnormal) and his pulse is 88. His respiration is 16 and oxygen saturation is 100%.   Wt Readings from Last 3 Encounters:  02/12/20 167 lb (75.8 kg)  01/15/20 166 lb (75.3 kg)  12/04/19 167 lb (75.8 kg)    Physical Exam Vitals reviewed.  HENT:     Head: Normocephalic and atraumatic.  Eyes:     Pupils: Pupils are equal, round, and reactive to light.     Comments: Conjunctiva are pale.  Cardiovascular:     Rate and Rhythm: Normal rate and regular rhythm.     Heart sounds: Normal heart sounds.  Pulmonary:     Effort: Pulmonary effort is  normal.     Breath sounds: Normal breath sounds.  Abdominal:     General: Bowel sounds are normal.     Palpations: Abdomen is soft.     Comments: Abdominal exam shows a soft abdomen with good bowel sounds.  He has no fluid wave.  There is no guarding or rebound tenderness.  He has no abdominal mass.  His spleen is about 8-9 cm below the left costal margin.  There is no obvious hepatomegaly.  Musculoskeletal:        General: No tenderness or deformity. Normal range of motion.     Cervical back: Normal range of motion.  Lymphadenopathy:     Cervical: No cervical adenopathy.  Skin:    General: Skin is warm and dry.     Coloration: Skin is pale.     Findings: No erythema or rash.  Neurological:     Mental Status: He is alert and oriented to person, place, and time.  Psychiatric:        Behavior: Behavior normal.        Thought Content: Thought content normal.        Judgment: Judgment normal.      Lab Results  Component Value Date   WBC 24.4 (H) 02/12/2020   HGB 7.2 (L) 02/12/2020   HCT 24.9 (L) 02/12/2020   MCV 124.5 (H) 02/12/2020   PLT 210 02/12/2020   Lab Results  Component Value Date   FERRITIN 277 01/15/2020   IRON 73 01/15/2020   TIBC 263 01/15/2020   UIBC 190 01/15/2020   IRONPCTSAT 28 01/15/2020   Lab Results  Component Value Date   RETICCTPCT 11.5 (H) 02/12/2020   RBC 2.00 (L) 02/12/2020   RBC 1.99 (L) 02/12/2020   RETICCTABS 173.6 02/27/2015   No results found for: KPAFRELGTCHN, LAMBDASER, KAPLAMBRATIO No results found for: IGGSERUM, IGA, IGMSERUM No results found for: Odetta Pink, SPEI   Chemistry      Component Value Date/Time   NA 140 02/12/2020 1339   NA 141 07/19/2017 1417   NA 141 05/14/2016 1202   K 4.5 02/12/2020 1339   K 4.6 07/19/2017 1417   K 4.8 05/14/2016 1202   CL 106 02/12/2020 1339   CL 103 07/19/2017 1417   CO2 26 02/12/2020 1339   CO2 30 07/19/2017 1417   CO2 26 05/14/2016  1202   BUN 19 02/12/2020 1339   BUN 13 07/19/2017 1417   BUN 14.0 05/14/2016 1202   CREATININE 1.05 02/12/2020 1339   CREATININE 0.9 07/19/2017 1417   CREATININE  1.0 05/14/2016 1202      Component Value Date/Time   CALCIUM 9.2 02/12/2020 1339   CALCIUM 9.1 07/19/2017 1417   CALCIUM 9.2 05/14/2016 1202   ALKPHOS 522 (H) 02/12/2020 1339   ALKPHOS 870 (H) 07/19/2017 1417   ALKPHOS 913 (H) 05/14/2016 1202   AST 51 (H) 02/12/2020 1339   AST 39 (H) 05/14/2016 1202   ALT 22 02/12/2020 1339   ALT 50 (H) 07/19/2017 1417   ALT 41 05/14/2016 1202   BILITOT 2.2 (H) 02/12/2020 1339   BILITOT 1.00 05/14/2016 1202      Impression and Plan: Mr. Burkitt is a very pleasant 69 yo caucasian gentleman with polycythemia vera, JAK2 positive.   Again, I have to suspect that we are looking at transformation.  As such, we are going to have to get a bone marrow biopsy on him.  Patient had to be transfused.  I talked to him about a blood transfusion.  We have never had a transfusion before.  I just cannot see that his blood count go down further and then he is run into problems with significant fatigue and weakness.  He agrees to the blood transfusion.  I would want to get an ultrasound of his abdomen.  I think that his spleen is significantly larger than what we last saw him.  Ultimately, he is going to need to have a bone marrow biopsy done.  I will think he is having 1 probably for 10 years or more if longer.  This was very complicated.  I was not expecting to see the blood count change so dramatically.  I had a spent about 45 minutes with him today.  We will plan to get him back to see [place] in about 3 weeks time at which point we should know exactly what is going on.  Volanda Napoleon, MD 6/21/20212:26 PM

## 2020-02-13 ENCOUNTER — Inpatient Hospital Stay: Payer: Medicare PPO

## 2020-02-13 DIAGNOSIS — Z7982 Long term (current) use of aspirin: Secondary | ICD-10-CM | POA: Diagnosis not present

## 2020-02-13 DIAGNOSIS — R531 Weakness: Secondary | ICD-10-CM | POA: Diagnosis not present

## 2020-02-13 DIAGNOSIS — R5383 Other fatigue: Secondary | ICD-10-CM | POA: Diagnosis not present

## 2020-02-13 DIAGNOSIS — K255 Chronic or unspecified gastric ulcer with perforation: Secondary | ICD-10-CM | POA: Diagnosis not present

## 2020-02-13 DIAGNOSIS — B9681 Helicobacter pylori [H. pylori] as the cause of diseases classified elsewhere: Secondary | ICD-10-CM | POA: Diagnosis not present

## 2020-02-13 DIAGNOSIS — D45 Polycythemia vera: Secondary | ICD-10-CM

## 2020-02-13 LAB — IRON AND TIBC
Iron: 75 ug/dL (ref 42–163)
Saturation Ratios: 33 % (ref 20–55)
TIBC: 225 ug/dL (ref 202–409)
UIBC: 150 ug/dL (ref 117–376)

## 2020-02-13 LAB — FERRITIN: Ferritin: 337 ng/mL — ABNORMAL HIGH (ref 24–336)

## 2020-02-13 NOTE — Patient Instructions (Signed)

## 2020-02-14 ENCOUNTER — Telehealth: Payer: Self-pay | Admitting: *Deleted

## 2020-02-14 LAB — TYPE AND SCREEN
ABO/RH(D): B POS
Antibody Screen: NEGATIVE
Unit division: 0
Unit division: 0

## 2020-02-14 LAB — BPAM RBC
Blood Product Expiration Date: 202107102359
Blood Product Expiration Date: 202107192359
ISSUE DATE / TIME: 202106220751
ISSUE DATE / TIME: 202106220751
Unit Type and Rh: 7300
Unit Type and Rh: 7300

## 2020-02-14 NOTE — Telephone Encounter (Signed)
Pt had question at the end of blood transfusion appt yesterday, if he should continue Aspirin or not.  Informed pt to hold Asprin and that I would speak with Dr. Marin Olp today about his Aspirin.  Call placed to patient and patient notified per order of Dr. Marin Olp to continue Aspirin as previously ordered.  Pt appreciative of call and has no further questions at this time.

## 2020-02-15 ENCOUNTER — Other Ambulatory Visit: Payer: Self-pay | Admitting: Hematology & Oncology

## 2020-02-15 ENCOUNTER — Other Ambulatory Visit: Payer: Self-pay

## 2020-02-15 ENCOUNTER — Ambulatory Visit (HOSPITAL_BASED_OUTPATIENT_CLINIC_OR_DEPARTMENT_OTHER)
Admission: RE | Admit: 2020-02-15 | Discharge: 2020-02-15 | Disposition: A | Discharge status: Home / Self Care | Payer: Medicare PPO | Source: Ambulatory Visit | Attending: Hematology & Oncology | Admitting: Hematology & Oncology

## 2020-02-15 ENCOUNTER — Telehealth: Payer: Self-pay | Admitting: *Deleted

## 2020-02-15 DIAGNOSIS — D45 Polycythemia vera: Secondary | ICD-10-CM | POA: Diagnosis not present

## 2020-02-15 DIAGNOSIS — R161 Splenomegaly, not elsewhere classified: Secondary | ICD-10-CM | POA: Diagnosis not present

## 2020-02-15 NOTE — Telephone Encounter (Signed)
-----   Message from Volanda Napoleon, MD sent at 02/15/2020  2:06 PM EDT ----- Call - the spleen is definitely larger!!!  The bone marrow will be critical!!  Laurey Arrow

## 2020-02-15 NOTE — Telephone Encounter (Signed)
Message left on pt.'s private voicemail to notify patient that "the spleen is definitely larger!!!  The bone marrow will be critical!!!"  Instructed pt to call office back with any questions or concerns.

## 2020-02-18 ENCOUNTER — Other Ambulatory Visit: Payer: Self-pay | Admitting: Radiology

## 2020-02-20 ENCOUNTER — Ambulatory Visit (HOSPITAL_COMMUNITY)
Admission: RE | Admit: 2020-02-20 | Discharge: 2020-02-20 | Disposition: A | Discharge status: Home / Self Care | Payer: Medicare PPO | Source: Ambulatory Visit | Attending: Hematology & Oncology | Admitting: Hematology & Oncology

## 2020-02-20 ENCOUNTER — Encounter (HOSPITAL_COMMUNITY): Payer: Self-pay

## 2020-02-20 ENCOUNTER — Other Ambulatory Visit: Payer: Self-pay

## 2020-02-20 DIAGNOSIS — D689 Coagulation defect, unspecified: Secondary | ICD-10-CM | POA: Diagnosis not present

## 2020-02-20 DIAGNOSIS — Z79899 Other long term (current) drug therapy: Secondary | ICD-10-CM | POA: Insufficient documentation

## 2020-02-20 DIAGNOSIS — D72829 Elevated white blood cell count, unspecified: Secondary | ICD-10-CM | POA: Diagnosis not present

## 2020-02-20 DIAGNOSIS — D45 Polycythemia vera: Secondary | ICD-10-CM

## 2020-02-20 DIAGNOSIS — D751 Secondary polycythemia: Secondary | ICD-10-CM | POA: Insufficient documentation

## 2020-02-20 DIAGNOSIS — D649 Anemia, unspecified: Secondary | ICD-10-CM | POA: Diagnosis not present

## 2020-02-20 DIAGNOSIS — Z86718 Personal history of other venous thrombosis and embolism: Secondary | ICD-10-CM | POA: Diagnosis not present

## 2020-02-20 DIAGNOSIS — D4989 Neoplasm of unspecified behavior of other specified sites: Secondary | ICD-10-CM | POA: Diagnosis not present

## 2020-02-20 DIAGNOSIS — Z8719 Personal history of other diseases of the digestive system: Secondary | ICD-10-CM | POA: Diagnosis not present

## 2020-02-20 DIAGNOSIS — R161 Splenomegaly, not elsewhere classified: Secondary | ICD-10-CM | POA: Insufficient documentation

## 2020-02-20 DIAGNOSIS — Z7982 Long term (current) use of aspirin: Secondary | ICD-10-CM | POA: Insufficient documentation

## 2020-02-20 DIAGNOSIS — K219 Gastro-esophageal reflux disease without esophagitis: Secondary | ICD-10-CM | POA: Diagnosis not present

## 2020-02-20 DIAGNOSIS — D539 Nutritional anemia, unspecified: Secondary | ICD-10-CM | POA: Diagnosis not present

## 2020-02-20 LAB — CBC WITH DIFFERENTIAL/PLATELET
Abs Immature Granulocytes: 1.4 10*3/uL — ABNORMAL HIGH (ref 0.00–0.07)
Band Neutrophils: 2 %
Basophils Absolute: 0.9 10*3/uL — ABNORMAL HIGH (ref 0.0–0.1)
Basophils Relative: 4 %
Blasts: 3 %
Eosinophils Absolute: 0 10*3/uL (ref 0.0–0.5)
Eosinophils Relative: 0 %
HCT: 32.6 % — ABNORMAL LOW (ref 39.0–52.0)
Hemoglobin: 9.6 g/dL — ABNORMAL LOW (ref 13.0–17.0)
Lymphocytes Relative: 6 %
Lymphs Abs: 1.4 10*3/uL (ref 0.7–4.0)
MCH: 33.9 pg (ref 26.0–34.0)
MCHC: 29.4 g/dL — ABNORMAL LOW (ref 30.0–36.0)
MCV: 115.2 fL — ABNORMAL HIGH (ref 80.0–100.0)
Metamyelocytes Relative: 5 %
Monocytes Absolute: 0.7 10*3/uL (ref 0.1–1.0)
Monocytes Relative: 3 %
Myelocytes: 1 %
Neutro Abs: 18.5 10*3/uL — ABNORMAL HIGH (ref 1.7–7.7)
Neutrophils Relative %: 76 %
Platelets: 252 10*3/uL (ref 150–400)
RBC: 2.83 MIL/uL — ABNORMAL LOW (ref 4.22–5.81)
RDW: 24.7 % — ABNORMAL HIGH (ref 11.5–15.5)
WBC: 23.7 10*3/uL — ABNORMAL HIGH (ref 4.0–10.5)
nRBC: 0.4 % — ABNORMAL HIGH (ref 0.0–0.2)

## 2020-02-20 LAB — PROTIME-INR
INR: 1.2 (ref 0.8–1.2)
Prothrombin Time: 14.3 seconds (ref 11.4–15.2)

## 2020-02-20 MED ORDER — MIDAZOLAM HCL 2 MG/2ML IJ SOLN
INTRAMUSCULAR | Status: AC
  Filled 2020-02-20: qty 4

## 2020-02-20 MED ORDER — FENTANYL CITRATE (PF) 100 MCG/2ML IJ SOLN
INTRAMUSCULAR | Status: AC | PRN
  Administered 2020-02-20 (×2): 50 ug via INTRAVENOUS

## 2020-02-20 MED ORDER — LIDOCAINE HCL (PF) 1 % IJ SOLN
INTRAMUSCULAR | Status: AC | PRN
  Administered 2020-02-20: 10 mL

## 2020-02-20 MED ORDER — FENTANYL CITRATE (PF) 100 MCG/2ML IJ SOLN
INTRAMUSCULAR | Status: AC
  Filled 2020-02-20: qty 2

## 2020-02-20 MED ORDER — SODIUM CHLORIDE 0.9 % IV SOLN: INTRAVENOUS | Status: DC

## 2020-02-20 MED ORDER — MIDAZOLAM HCL 2 MG/2ML IJ SOLN
INTRAMUSCULAR | Status: AC | PRN
  Administered 2020-02-20 (×2): 1 mg via INTRAVENOUS

## 2020-02-20 NOTE — Procedures (Signed)
Interventional Radiology Procedure Note  Procedure: CT BM ASP AND CORE  Complications: None  Estimated Blood Loss: min  Findings: Dry tap, 2 cores obtained

## 2020-02-20 NOTE — Discharge Instructions (Signed)
Bone Marrow Aspiration and Bone Marrow Biopsy, Adult, Care After This sheet gives you information about how to care for yourself after your procedure. Your health care provider may also give you more specific instructions. If you have problems or questions, contact your health care provider. What can I expect after the procedure? After the procedure, it is common to have:  Mild pain and tenderness.  Swelling.  Bruising. Follow these instructions at home: Puncture site care   Follow instructions from your health care provider about how to take care of the puncture site. Make sure you: ? Wash your hands with soap and water before and after you change your bandage (dressing). If soap and water are not available, use hand sanitizer. ? Change your dressing as told by your health care provider.  Check your puncture site every day for signs of infection. Check for: ? More redness, swelling, or pain. ? Fluid or blood. ? Warmth. ? Pus or a bad smell. Activity  Return to your normal activities as told by your health care provider. Ask your health care provider what activities are safe for you.  Do not lift anything that is heavier than 10 lb (4.5 kg), or the limit that you are told, until your health care provider says that it is safe.  Do not drive for 24 hours if you were given a sedative during your procedure. General instructions   Take over-the-counter and prescription medicines only as told by your health care provider.  Do not take baths, swim, or use a hot tub until your health care provider approves. Ask your health care provider if you may take showers. You may only be allowed to take sponge baths.  If directed, put ice on the affected area. To do this: ? Put ice in a plastic bag. ? Place a towel between your skin and the bag. ? Leave the ice on for 20 minutes, 2-3 times a day.  Keep all follow-up visits as told by your health care provider. This is important. Contact a  health care provider if:  Your pain is not controlled with medicine.  You have a fever.  You have more redness, swelling, or pain around the puncture site.  You have fluid or blood coming from the puncture site.  Your puncture site feels warm to the touch.  You have pus or a bad smell coming from the puncture site. Summary  After the procedure, it is common to have mild pain, tenderness, swelling, and bruising.  Follow instructions from your health care provider about how to take care of the puncture site and what activities are safe for you.  Take over-the-counter and prescription medicines only as told by your health care provider.  Contact a health care provider if you have any signs of infection, such as fluid or blood coming from the puncture site. This information is not intended to replace advice given to you by your health care provider. Make sure you discuss any questions you have with your health care provider. Document Revised: 12/27/2018 Document Reviewed: 12/27/2018 Elsevier Patient Education  2020 Elsevier Inc. Moderate Conscious Sedation, Adult, Care After These instructions provide you with information about caring for yourself after your procedure. Your health care provider may also give you more specific instructions. Your treatment has been planned according to current medical practices, but problems sometimes occur. Call your health care provider if you have any problems or questions after your procedure. What can I expect after the procedure? After your procedure,   it is common:  To feel sleepy for several hours.  To feel clumsy and have poor balance for several hours.  To have poor judgment for several hours.  To vomit if you eat too soon. Follow these instructions at home: For at least 24 hours after the procedure:   Do not: ? Participate in activities where you could fall or become injured. ? Drive. ? Use heavy machinery. ? Drink alcohol. ? Take  sleeping pills or medicines that cause drowsiness. ? Make important decisions or sign legal documents. ? Take care of children on your own.  Rest. Eating and drinking  Follow the diet recommended by your health care provider.  If you vomit: ? Drink water, juice, or soup when you can drink without vomiting. ? Make sure you have little or no nausea before eating solid foods. General instructions  Have a responsible adult stay with you until you are awake and alert.  Take over-the-counter and prescription medicines only as told by your health care provider.  If you smoke, do not smoke without supervision.  Keep all follow-up visits as told by your health care provider. This is important. Contact a health care provider if:  You keep feeling nauseous or you keep vomiting.  You feel light-headed.  You develop a rash.  You have a fever. Get help right away if:  You have trouble breathing. This information is not intended to replace advice given to you by your health care provider. Make sure you discuss any questions you have with your health care provider. Document Revised: 07/23/2017 Document Reviewed: 11/30/2015 Elsevier Patient Education  2020 Elsevier Inc.  

## 2020-02-20 NOTE — Consult Note (Signed)
Chief Complaint: Patient was seen in consultation today for CT guided bone marrow biopsy  Referring Physician(s): Ennever,Peter R  Supervising Physician: Daryll Brod  Patient Status: [facility] - Out-pt  History of Present Illness: Benjamin [name] is a 69 y.o. male with hx polycthemia vera, new anemia and splenomegaly who presents today for CT guided bone marrow biopsy to assess for conversion to myelofibrosis.   Past Medical History:  Diagnosis Date  . Anemia   . Clotting disorder ([place])   . Colon polyps   . DVT (deep venous thrombosis) ([name])    hx -lt leg-dx polycythemia  . Focal dystonia   . GERD (gastroesophageal reflux disease)   . 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 Perforated bowel ([place])   . Perforated stomach ([place])    denies perforated bowel  . Polycythemia   . Sepsis [facility])     Past Surgical History:  Procedure Laterality Date  . COLONOSCOPY    . COLONOSCOPY    . 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 FINGER ARTHROPLASTY  1998   rt index  . HERNIA REPAIR    . INGUINAL HERNIA REPAIR  2007   left  . INGUINAL HERNIA REPAIR  2009   right  . LAPAROTOMY N/A 12/15/2015   Procedure: EXPLORATORY LAPAROTOMY;  Surgeon: Leighton Ruff, MD;  Location: [facility];  Service: General;  Laterality: N/A;  . WISDOM TOOTH EXTRACTION      Allergies: Aleve [naproxen sodium], Biaxin [clarithromycin], Cephalexin, Flagyl [metronidazole], and Morphine and related  Medications: Prior to Admission medications   Medication Sig Start Date End Date Taking? Authorizing Provider  aspirin 81 MG tablet Take 81 mg by mouth every morning.    Yes [provider]  Cholecalciferol (VITAMIN D) 2000 units CAPS Take by mouth.   Yes [provider]  hydroxyurea (HYDREA) 500 MG capsule Take 1 capsule (500 mg total)  by mouth daily. May take with food to minimize GI side effects. 12/04/19  Yes Volanda Napoleon, MD  Magnesium 400 MG CAPS Take 1 capsule by mouth daily.    Yes [provider]  colchicine 0.6 MG tablet Take 1 tablet (0.6 mg total) by mouth daily. 12/12/19   Stefanie Libel, MD  Testosterone (ANDROGEL) 25 MG/2.5GM (1%) GEL Place 25 mg onto the skin daily. 01/16/20   Volanda Napoleon, MD     Family History  Problem Relation Age of Onset  . Heart disease Father   . Hyperlipidemia Father   . Hypertension Father   . Diabetes Father   . Diabetes Brother   . Hyperlipidemia Brother   . Prostate cancer Brother        oldest brother  . Colon cancer Neg Hx     Social History   Socioeconomic History  . Marital status: Married    Spouse name: Not on file  . Number of children: Not on file  . Years of education: Not on file  . Highest education level: Not on file  Occupational History  . Not on file  Tobacco Use  . Smoking status: Never Smoker  . Smokeless tobacco: Never Used  . Tobacco comment: never used tobacco  Vaping Use  . Vaping Use: Never used  Substance and Sexual Activity  . Alcohol use: Yes    Alcohol/week: 0.0 standard drinks    Comment: almost  daily wine  . Drug use: No  . Sexual activity: Not on file  Other Topics Concern  . Not on file  Social History Narrative  . Not on file   Social Determinants of Health   Financial Resource Strain:   . Difficulty of Paying Living Expenses:   Food Insecurity:   . Worried About Charity fundraiser in the Last Year:   . Arboriculturist in the Last Year:   Transportation Needs:   . Film/video editor (Medical):   Marland Kitchen Lack of Transportation (Non-Medical):   Physical Activity:   . Days of Exercise per Week:   . Minutes of Exercise per Session:   Stress:   . Feeling of Stress :   Social Connections:   . Frequency of Communication with Friends and Family:   . Frequency of Social Gatherings with Friends and Family:   .  Attends Religious Services:   . Active Member of Clubs or Organizations:   . Attends Archivist Meetings:   Marland Kitchen Marital Status:      Review of Systems denies fever,HA,CP,dyspnea, cough, abd/back pain, N/V or bleeding; he does LE swelling  Vital Signs: temp 98.1 , BP 149/67, HR 80, R 17, O2 SATS 98% RA Ht 6' (1.829 m)   Wt 167 lb (75.8 kg)   BMI 22.65 kg/m   Physical Exam awake/alert; chest- sl distant BS bilat; heart - RRR; abd- soft,+BS, NT, splenomegaly; bilat LE edema  Imaging: US Abdomen Limited  Result Date: 02/15/2020 CLINICAL DATA:  Polycythemia.  Question splenomegaly. EXAM: ULTRASOUND ABDOMEN LIMITED COMPARISON:  Ultrasound 12/11/2019. FINDINGS: Spleen measures 22.4 x 15.4 x 12.9 cm with a volume of 2349.3 cc. Spleen is increased in size from prior exam. No focal splenic abnormality identified. IMPRESSION: Severe splenomegaly.  Spleen has increased in size from prior exam. Electronically Signed   By: [place]   On: 02/15/2020 13:17    Labs:  CBC: Recent Labs    12/04/19 1138 01/15/20 1523 02/12/20 1339 02/20/20 0940  WBC 17.1* 23.6* 24.4* 23.7*  HGB 12.7* 10.8* 7.2* 9.6*  HCT 43.7 35.8* 24.9* 32.6*  PLT 294 328 210 252    COAGS: Recent Labs    02/20/20 0940  INR 1.2    BMP: Recent Labs    10/20/19 1124 12/04/19 1138 01/15/20 1523 02/12/20 1339  NA 141 140 142 140  K 4.5 4.5 5.1 4.5  CL 104 105 107 106  CO2 _0 GLUCOSE 166* 81 99 119*  BUN 23 27* 22 19  CALCIUM 9.5 9.2 9.4 9.2  CREATININE 1.01 0.97 0.95 1.05  GFRNONAA >60 >60 >60 >60  GFRAA >60 >60 >60 >60    LIVER FUNCTION TESTS: Recent Labs    10/20/19 1124 12/04/19 1138 01/15/20 1523 02/12/20 1339  BILITOT 1.0 1.3* 1.2 2.2*  AST _1 51*  ALT _2 ALKPHOS 728* 666* 676* 522*  PROT 6.4* 6.0* 6.4* 6.4*  ALBUMIN 4.5 4.2 4.2 3.8    TUMOR MARKERS: No results for input(s): AFPTM, CEA, CA199, CHROMGRNA in the last 8760 hours.  Assessment and  Plan: 69 y.o. male with hx polycthemia vera, new anemia and splenomegaly who presents today for CT guided bone marrow biopsy to assess for conversion to myelofibrosis.Risks and benefits of procedure was discussed with the patient  including, but not limited to bleeding, infection, damage to adjacent structures or low yield requiring additional tests.  All of the questions  were answered and there is agreement to proceed.  Consent signed and in chart.     Thank you for this interesting consult.  I greatly enjoyed meeting Benjamin [name] and look forward to participating in their care.  A copy of this report was sent to the requesting provider on this date.  Electronically Signed: D. Rowe Robert, PA-C 02/20/2020, 10:42 AM   I spent a total of  20 minutes   in face to face in clinical consultation, greater than 50% of which was counseling/coordinating care for CT guided bone marrow biopsy

## 2020-02-24 LAB — SURGICAL PATHOLOGY

## 2020-02-28 ENCOUNTER — Encounter (HOSPITAL_COMMUNITY): Payer: Self-pay | Admitting: Hematology & Oncology

## 2020-03-01 ENCOUNTER — Inpatient Hospital Stay: Payer: Medicare PPO | Attending: Hematology & Oncology

## 2020-03-01 ENCOUNTER — Inpatient Hospital Stay (HOSPITAL_BASED_OUTPATIENT_CLINIC_OR_DEPARTMENT_OTHER): Payer: Medicare PPO | Admitting: Hematology & Oncology

## 2020-03-01 ENCOUNTER — Other Ambulatory Visit: Payer: Self-pay

## 2020-03-01 ENCOUNTER — Encounter: Payer: Self-pay | Admitting: Hematology & Oncology

## 2020-03-01 VITALS — BP 135/49 | HR 86 | Temp 98.0°F | Resp 16 | Wt 161.0 lb

## 2020-03-01 DIAGNOSIS — R161 Splenomegaly, not elsewhere classified: Secondary | ICD-10-CM | POA: Insufficient documentation

## 2020-03-01 DIAGNOSIS — D45 Polycythemia vera: Secondary | ICD-10-CM | POA: Insufficient documentation

## 2020-03-01 DIAGNOSIS — D649 Anemia, unspecified: Secondary | ICD-10-CM | POA: Insufficient documentation

## 2020-03-01 DIAGNOSIS — K255 Chronic or unspecified gastric ulcer with perforation: Secondary | ICD-10-CM | POA: Diagnosis not present

## 2020-03-01 DIAGNOSIS — B9681 Helicobacter pylori [H. pylori] as the cause of diseases classified elsewhere: Secondary | ICD-10-CM | POA: Insufficient documentation

## 2020-03-01 LAB — CMP (CANCER CENTER ONLY)
ALT: 14 U/L (ref 0–44)
AST: 35 U/L (ref 15–41)
Albumin: 4.4 g/dL (ref 3.5–5.0)
Alkaline Phosphatase: 661 U/L — ABNORMAL HIGH (ref 38–126)
Anion gap: 9 (ref 5–15)
BUN: 23 mg/dL (ref 8–23)
CO2: 27 mmol/L (ref 22–32)
Calcium: 9.2 mg/dL (ref 8.9–10.3)
Chloride: 103 mmol/L (ref 98–111)
Creatinine: 1 mg/dL (ref 0.61–1.24)
GFR, Est AFR Am: >60 mL/min (ref >60)
GFR, Estimated: >60 mL/min (ref >60)
Glucose, Bld: 135 mg/dL — ABNORMAL HIGH (ref 70–99)
Potassium: 4.5 mmol/L (ref 3.5–5.1)
Sodium: 139 mmol/L (ref 135–145)
Total Bilirubin: 1.7 mg/dL — ABNORMAL HIGH (ref 0.3–1.2)
Total Protein: 6.6 g/dL (ref 6.5–8.1)

## 2020-03-01 LAB — RETICULOCYTES
Immature Retic Fract: 29.6 % — ABNORMAL HIGH (ref 2.3–15.9)
RBC.: 2.7 MIL/uL — ABNORMAL LOW (ref 4.22–5.81)
Retic Count, Absolute: 200.9 10*3/uL — ABNORMAL HIGH (ref 19.0–186.0)
Retic Ct Pct: 7.4 % — ABNORMAL HIGH (ref 0.4–3.1)

## 2020-03-01 LAB — CBC WITH DIFFERENTIAL (CANCER CENTER ONLY)
Abs Immature Granulocytes: 4.06 10*3/uL — ABNORMAL HIGH (ref 0.00–0.07)
Basophils Absolute: 0.3 10*3/uL — ABNORMAL HIGH (ref 0.0–0.1)
Basophils Relative: 1 %
Eosinophils Absolute: 0.1 10*3/uL (ref 0.0–0.5)
Eosinophils Relative: 1 %
HCT: 29.6 % — ABNORMAL LOW (ref 39.0–52.0)
Hemoglobin: 8.8 g/dL — ABNORMAL LOW (ref 13.0–17.0)
Immature Granulocytes: 16 %
Lymphocytes Relative: 9 %
Lymphs Abs: 2.4 10*3/uL (ref 0.7–4.0)
MCH: 32.6 pg (ref 26.0–34.0)
MCHC: 29.7 g/dL — ABNORMAL LOW (ref 30.0–36.0)
MCV: 109.6 fL — ABNORMAL HIGH (ref 80.0–100.0)
Monocytes Absolute: 1.3 10*3/uL — ABNORMAL HIGH (ref 0.1–1.0)
Monocytes Relative: 5 %
Neutro Abs: 17.7 10*3/uL — ABNORMAL HIGH (ref 1.7–7.7)
Neutrophils Relative %: 68 %
Platelet Count: 224 10*3/uL (ref 150–400)
RBC: 2.7 MIL/uL — ABNORMAL LOW (ref 4.22–5.81)
RDW: 24.4 % — ABNORMAL HIGH (ref 11.5–15.5)
WBC Count: 25.9 10*3/uL — ABNORMAL HIGH (ref 4.0–10.5)
nRBC: 0.5 % — ABNORMAL HIGH (ref 0.0–0.2)

## 2020-03-01 LAB — SAVE SMEAR(SSMR), FOR PROVIDER SLIDE REVIEW

## 2020-03-01 LAB — SAMPLE TO BLOOD BANK

## 2020-03-01 MED ORDER — RUXOLITINIB PHOSPHATE 20 MG PO TABS
20.0000 mg | ORAL_TABLET | Freq: Two times a day (BID) | ORAL | 6 refills | Status: DC
Start: 2020-03-01 — End: 2020-03-04

## 2020-03-01 MED ORDER — TESTOSTERONE 4 MG/24HR TD PT24: 1.0000 | MEDICATED_PATCH | Freq: Every day | TRANSDERMAL | 2 refills | Status: DC

## 2020-03-01 NOTE — Progress Notes (Signed)
Hematology and Oncology Follow Up Visit  Benjamin [name] [id number] Oct 31, 1950 69 y.o. 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  Things are really changed for him.  More last saw him, his hemoglobin was down to 7.2.  His blood smear certainly looks more "active.".  His spleen was larger on exam.  I had a feeling that he was beginning to transform from polycythemia to possibly myelofibrosis or even to an acute leukemic phase.  We got an ultrasound of the abdomen.  This clearly showed significant increase in splenic size.  The splenic volume was 2350 cm.  I think the key test however was the bone marrow biopsy and aspirate.  This was done on July 2.  The pathology report ([id number]) showed that there was a hypercellular marrow.  He had increasing blast cells but still less than 10%.  He had more fibrosis.  Think everything is consistent with his polycythemia trying to transform.  Given the fact that his bone marrow neoplasm is JAK 2+, we will now have to start [facility] on him.  He actually feels well today.  We did transfuse him when we last saw him.  This helped quite a bit.  I am sending off an erythropoietin level on him to see if we might be able to use ESA for anemia.  I talked him about Jakafi.  I told him how it worked.  I really believe that Benjamin [name] will really help with his splenomegaly and get his bone marrow less active.  He has had no fever.  His appetite is good.  Has had no problems with nausea or vomiting.  He does have some swelling in the legs.  He still has problems with his testosterone.  His insurance would not approve AndroGel.  As such, we will  have to try the AndroDerm patch.    He has had no problems with cough.  There is been no chest wall pain.  He has had no obvious bleeding.  Overall, I would say his performance status is ECOG 1.    Medications:  Allergies as of 03/01/2020      Reactions   Aleve [naproxen Sodium] Other (See Comments)   Pt thinks it might be all NSAIDs but is unsure. Caused him to have emergency stomach surgery.   Biaxin [clarithromycin] Other (See Comments)   Joint pains   Cephalexin Hives, Rash   Flagyl [metronidazole] Other (See Comments)   Joint pain   Morphine And Related Hives      Medication List       Accurate as of March 01, 2020  4:07 PM. If you have any questions, ask your nurse or doctor.        STOP taking these medications   Testosterone 25 MG/2.5GM (1%) Gel Commonly known as: AndroGel Replaced by: testosterone 4 MG/24HR Pt24 patch Stopped by: Volanda Napoleon, MD     TAKE these medications   aspirin 81 MG tablet Take 81 mg by mouth every morning.   colchicine 0.6 MG tablet Take 1 tablet (0.6 mg total) by mouth daily.   hydroxyurea 500 MG capsule Commonly known as: HYDREA Take  1 capsule (500 mg total) by mouth daily. May take with food to minimize GI side effects.   Magnesium 400 MG Caps Take 1 capsule by mouth daily.   ruxolitinib phosphate 20 MG tablet Commonly known as: JAKAFI Take 1 tablet (20 mg total) by mouth 2 (two) times daily. Started by: Volanda Napoleon, MD   testosterone 4 MG/24HR Pt24 patch Commonly known as: ANDRODERM Place 1 patch onto the skin daily. Replaces: Testosterone 25 MG/2.5GM (1%) Gel Started by: Volanda Napoleon, MD   Vitamin D 50 MCG (2000 UT) Caps Take by mouth.       Allergies:  Allergies  Allergen Reactions  . Aleve [Naproxen Sodium] Other (See Comments)    Pt thinks it might be all NSAIDs but is unsure. Caused him to have emergency stomach surgery.  . Biaxin [Clarithromycin] Other (See Comments)    Joint pains  . Cephalexin  Hives and Rash  . Flagyl [Metronidazole] Other (See Comments)    Joint pain  . Morphine And Related Hives    Past Medical History, Surgical history, Social history, and Family History were reviewed and updated.  Review of Systems:  Review of Systems  Constitutional: Negative.   HENT: Negative.   Eyes: Negative.   Respiratory: Negative.   Cardiovascular: Negative.   Gastrointestinal: Negative.   Genitourinary: Negative.   Musculoskeletal: Negative.   Skin: Negative.   Neurological: Negative.   Endo/Heme/Allergies: Negative.   Psychiatric/Behavioral: Negative.     Physical Exam:  weight is 161 lb (73 kg). His oral temperature is 98 F (36.7 C). His blood pressure is 135/49 (abnormal) and his pulse is 86. His respiration is 16 and oxygen saturation is 99%.   Wt Readings from Last 3 Encounters:  03/01/20 161 lb (73 kg)  02/20/20 167 lb (75.8 kg)  02/12/20 167 lb (75.8 kg)    Physical Exam Vitals reviewed.  HENT:     Head: Normocephalic and atraumatic.  Eyes:     Pupils: Pupils are equal, round, and reactive to light.  Cardiovascular:     Rate and Rhythm: Normal rate and regular rhythm.     Heart sounds: Normal heart sounds.  Pulmonary:     Effort: Pulmonary effort is normal.     Breath sounds: Normal breath sounds.  Abdominal:     General: Bowel sounds are normal.     Palpations: Abdomen is soft.     Comments: Abdominal exam shows a soft abdomen.  He has decent bowel sounds.  There is no fluid wave.  His spleen tip is down to the umbilicus.  There is no hepatomegaly.  Musculoskeletal:        General: No tenderness or deformity. Normal range of motion.     Cervical back: Normal range of motion.     Comments: He does have about 1-2+ edema in his legs bilaterally.  Lymphadenopathy:     Cervical: No cervical adenopathy.  Skin:    General: Skin is warm and dry.     Findings: No erythema or rash.  Neurological:     Mental Status: He is alert and oriented to person,  place, and time.  Psychiatric:        Behavior: Behavior normal.        Thought Content: Thought content normal.        Judgment: Judgment normal.    Lab Results  Component Value Date   WBC 25.9 (H) 03/01/2020   HGB 8.8 (L) 03/01/2020   HCT 29.6 (L) 03/01/2020  MCV 109.6 (H) 03/01/2020   PLT 224 03/01/2020   Lab Results  Component Value Date   FERRITIN 337 (H) 02/12/2020   IRON 75 02/12/2020   TIBC 225 02/12/2020   UIBC 150 02/12/2020   IRONPCTSAT 33 02/12/2020   Lab Results  Component Value Date   RETICCTPCT 7.4 (H) 03/01/2020   RBC 2.70 (L) 03/01/2020   RETICCTABS 173.6 02/27/2015   No results found for: KPAFRELGTCHN, LAMBDASER, KAPLAMBRATIO No results found for: IGGSERUM, IGA, IGMSERUM No results found for: Kathrynn Ducking, MSPIKE, SPEI   Chemistry      Component Value Date/Time   NA 139 03/01/2020 1424   NA 141 07/19/2017 1417   NA 141 05/14/2016 1202   K 4.5 03/01/2020 1424   K 4.6 07/19/2017 1417   K 4.8 05/14/2016 1202   CL 103 03/01/2020 1424   CL 103 07/19/2017 1417   CO2 27 03/01/2020 1424   CO2 30 07/19/2017 1417   CO2 26 05/14/2016 1202   BUN 23 03/01/2020 1424   BUN 13 07/19/2017 1417   BUN 14.0 05/14/2016 1202   CREATININE 1.00 03/01/2020 1424   CREATININE 0.9 07/19/2017 1417   CREATININE 1.0 05/14/2016 1202      Component Value Date/Time   CALCIUM 9.2 03/01/2020 1424   CALCIUM 9.1 07/19/2017 1417   CALCIUM 9.2 05/14/2016 1202   ALKPHOS 661 (H) 03/01/2020 1424   ALKPHOS 870 (H) 07/19/2017 1417   ALKPHOS 913 (H) 05/14/2016 1202   AST 35 03/01/2020 1424   AST 39 (H) 05/14/2016 1202   ALT 14 03/01/2020 1424   ALT 50 (H) 07/19/2017 1417   ALT 41 05/14/2016 1202   BILITOT 1.7 (H) 03/01/2020 1424   BILITOT 1.00 05/14/2016 1202      Impression and Plan: Mr. Arizpe is a very pleasant 69 yo caucasian gentleman with polycythemia vera, JAK2 positive.   I hate the fact that he is trying to  transform.  We have followed him for about 16 years.  He has done so well.  I do feel confident that the Jakafi will help slow down his bone marrow.  We should see the alkaline phosphatase go down and also of the LDH level go down.  We will have to be careful with the anemia however.  That is 1 of Jakafi's side effects.  We will have to follow his lab work weekly.  I will start this at the end of July.  We will also be able to tell how Benjamin [name] is working by his splenomegaly.  His spleen is down to the umbilicus so we should be able to see the spleen shrink in size.  I am spent about 40-45 minutes with him today.  This was really complicated.  He now has a much more active problem that will have to be more aggressive with.  Volanda Napoleon, MD 7/9/20214:07 PM

## 2020-03-04 ENCOUNTER — Telehealth: Payer: Self-pay | Admitting: Pharmacist

## 2020-03-04 ENCOUNTER — Telehealth: Payer: Self-pay | Admitting: Hematology & Oncology

## 2020-03-04 ENCOUNTER — Telehealth: Payer: Self-pay | Admitting: Pharmacy Technician

## 2020-03-04 DIAGNOSIS — D45 Polycythemia vera: Secondary | ICD-10-CM

## 2020-03-04 LAB — IRON AND TIBC
Iron: 92 ug/dL (ref 42–163)
Saturation Ratios: 39 % (ref 20–55)
TIBC: 236 ug/dL (ref 202–409)
UIBC: 144 ug/dL (ref 117–376)

## 2020-03-04 LAB — FERRITIN: Ferritin: 714 ng/mL — ABNORMAL HIGH (ref 24–336)

## 2020-03-04 LAB — LACTATE DEHYDROGENASE: LDH: 1657 U/L — ABNORMAL HIGH (ref 98–192)

## 2020-03-04 LAB — ERYTHROPOIETIN: Erythropoietin: 56.2 m[IU]/mL — ABNORMAL HIGH (ref 2.6–18.5)

## 2020-03-04 MED ORDER — RUXOLITINIB PHOSPHATE 10 MG PO TABS: 10.0000 mg | ORAL_TABLET | Freq: Two times a day (BID) | ORAL | 6 refills | Status: DC

## 2020-03-04 NOTE — Telephone Encounter (Signed)
Oral Oncology Patient Advocate Encounter  Prior Authorization for [name] has been approved.    PA# [id number] Effective dates: 03/04/20 through 08/31/20  Patients co-pay is $100.00.  Oral Oncology Clinic will continue to follow.   [place] Patient [place] Phone [phone] Fax [phone] 03/04/2020 3:58 PM

## 2020-03-04 NOTE — Telephone Encounter (Signed)
Appointments scheduled calendar mailed per 7/9 los

## 2020-03-04 NOTE — Telephone Encounter (Signed)
Oral Oncology Pharmacist Encounter  Received new prescription for Jakafi (ruxolitinib) for the treatment of polycythemia vera, planned duration until disease progression or unacceptable drug toxicity.  CMP from 03/01/20 assessed, no relevant lab abnormalities. Prescription dose and frequency assessed.   Current medication list in Epic reviewed, no DDIs with roxolitinib identified.   Prescription has been e-scribed to the [facility] for benefits analysis and approval.  Oral Oncology Clinic will continue to follow for insurance authorization, copayment issues, initial counseling and start date.  Darl Pikes, PharmD, BCPS, BCOP, CPP Hematology/Oncology Clinical Pharmacist Practitioner [facility] [place] [facility] [phone]  03/04/2020 10:27 AM

## 2020-03-04 NOTE — Telephone Encounter (Signed)
Oral Oncology Patient Advocate Encounter   Received notification from [facility] that prior authorization for [name] is required.   PA submitted on CoverMyMeds Key [id number] Status is pending   Oral Oncology Clinic will continue to follow.  [place] Patient [place] Phone [phone] Fax [phone] 03/04/2020 3:53 PM

## 2020-03-05 ENCOUNTER — Telehealth: Payer: Self-pay | Admitting: Hematology & Oncology

## 2020-03-05 NOTE — Telephone Encounter (Signed)
Appointments scheduled calendar printed & mailed per 7/9 los 

## 2020-03-06 LAB — SURGICAL PATHOLOGY

## 2020-03-07 MED FILL — JAKAFI 10 MG TABLET: 10 | 30 days supply | Qty: 60 | Fill #0

## 2020-03-07 NOTE — Telephone Encounter (Signed)
Oral Chemotherapy Pharmacist Encounter  [place] will deliver the [facility] on 03/08/20. He know to get started when he receives the medication.  Patient Education I spoke with patient for overview of new oral chemotherapy medication: Jakafi (ruxolitinib) for the treatment of polycythemia vera, planned duration until disease progression or unacceptable drug toxicity.   Pt is doing well. Counseled patient on administration, dosing, side effects, monitoring, drug-food interactions, safe handling, storage, and disposal. Patient will take 1 tablet (10 mg total) by mouth 2 (two) times daily.  Side effects include but not limited to: decreased hgb/plt, fatigue.    Reviewed with patient importance of keeping a medication schedule and plan for any missed doses.  Benjamin [name] voiced understanding and appreciation. All questions answered. Medication handout placed in the mail.  Provided patient with Oral [place] [facility] phone number. Patient knows to call the office with questions or concerns. Oral Chemotherapy Navigation Clinic will continue to follow.  Darl Pikes, PharmD, BCPS, BCOP, CPP Hematology/Oncology Clinical Pharmacist Practitioner [facility] [phone]  03/07/2020 2:04 PM

## 2020-03-08 ENCOUNTER — Inpatient Hospital Stay: Payer: Medicare PPO

## 2020-03-08 ENCOUNTER — Telehealth: Payer: Self-pay | Admitting: *Deleted

## 2020-03-08 ENCOUNTER — Other Ambulatory Visit: Payer: Self-pay

## 2020-03-08 DIAGNOSIS — K255 Chronic or unspecified gastric ulcer with perforation: Secondary | ICD-10-CM | POA: Diagnosis not present

## 2020-03-08 DIAGNOSIS — R161 Splenomegaly, not elsewhere classified: Secondary | ICD-10-CM | POA: Diagnosis not present

## 2020-03-08 DIAGNOSIS — D649 Anemia, unspecified: Secondary | ICD-10-CM | POA: Diagnosis not present

## 2020-03-08 DIAGNOSIS — D45 Polycythemia vera: Secondary | ICD-10-CM

## 2020-03-08 DIAGNOSIS — B9681 Helicobacter pylori [H. pylori] as the cause of diseases classified elsewhere: Secondary | ICD-10-CM | POA: Diagnosis not present

## 2020-03-08 LAB — CMP (CANCER CENTER ONLY)
ALT: 14 U/L (ref 0–44)
AST: 27 U/L (ref 15–41)
Albumin: 4.2 g/dL (ref 3.5–5.0)
Alkaline Phosphatase: 654 U/L — ABNORMAL HIGH (ref 38–126)
Anion gap: 8 (ref 5–15)
BUN: 22 mg/dL (ref 8–23)
CO2: 27 mmol/L (ref 22–32)
Calcium: 9.5 mg/dL (ref 8.9–10.3)
Chloride: 104 mmol/L (ref 98–111)
Creatinine: 0.91 mg/dL (ref 0.61–1.24)
GFR, Est AFR Am: >60 mL/min (ref >60)
GFR, Estimated: >60 mL/min (ref >60)
Glucose, Bld: 98 mg/dL (ref 70–99)
Potassium: 4.3 mmol/L (ref 3.5–5.1)
Sodium: 139 mmol/L (ref 135–145)
Total Bilirubin: 1.5 mg/dL — ABNORMAL HIGH (ref 0.3–1.2)
Total Protein: 6.2 g/dL — ABNORMAL LOW (ref 6.5–8.1)

## 2020-03-08 LAB — CBC WITH DIFFERENTIAL (CANCER CENTER ONLY)
Abs Immature Granulocytes: 4.56 10*3/uL — ABNORMAL HIGH (ref 0.00–0.07)
Basophils Absolute: 0.3 10*3/uL — ABNORMAL HIGH (ref 0.0–0.1)
Basophils Relative: 1 %
Eosinophils Absolute: 0.2 10*3/uL (ref 0.0–0.5)
Eosinophils Relative: 1 %
HCT: 24.5 % — ABNORMAL LOW (ref 39.0–52.0)
Hemoglobin: 7.3 g/dL — ABNORMAL LOW (ref 13.0–17.0)
Immature Granulocytes: 17 %
Lymphocytes Relative: 11 %
Lymphs Abs: 2.8 10*3/uL (ref 0.7–4.0)
MCH: 33.5 pg (ref 26.0–34.0)
MCHC: 29.8 g/dL — ABNORMAL LOW (ref 30.0–36.0)
MCV: 112.4 fL — ABNORMAL HIGH (ref 80.0–100.0)
Monocytes Absolute: 1.7 10*3/uL — ABNORMAL HIGH (ref 0.1–1.0)
Monocytes Relative: 6 %
Neutro Abs: 17.3 10*3/uL — ABNORMAL HIGH (ref 1.7–7.7)
Neutrophils Relative %: 64 %
Platelet Count: 212 10*3/uL (ref 150–400)
RBC: 2.18 MIL/uL — ABNORMAL LOW (ref 4.22–5.81)
RDW: 25.7 % — ABNORMAL HIGH (ref 11.5–15.5)
WBC Count: 26.9 10*3/uL — ABNORMAL HIGH (ref 4.0–10.5)
nRBC: 1.2 % — ABNORMAL HIGH (ref 0.0–0.2)

## 2020-03-08 LAB — SAMPLE TO BLOOD BANK

## 2020-03-08 NOTE — Telephone Encounter (Signed)
Call received back from patient and he states that he feels "good" and does not feel as though he needs blood at this time.  Pt is without complaints.  Pt appreciative of call and has no questions at this time.

## 2020-03-08 NOTE — Telephone Encounter (Signed)
Oral Oncology Patient Advocate Encounter  I spoke with Mr Erekson on 03/07/20 to set up delivery of [place].  Address verified for shipment.  [name] will be filled through [facility] and mailed 7/15 for delivery 7/16.    [facility] will call 7-10 days before next refill is due to complete adherence call and set up delivery of medication.     [place] Patient [place] Phone [phone] Fax [phone] 03/08/2020 10:11 AM

## 2020-03-08 NOTE — Telephone Encounter (Signed)
Message left on pt.'s private cell phone instructing him to call office back ASAP due to drop in hemoglobin.  No answer on pt.'s home phone.

## 2020-03-15 ENCOUNTER — Other Ambulatory Visit: Payer: Self-pay

## 2020-03-15 ENCOUNTER — Inpatient Hospital Stay: Payer: Medicare PPO

## 2020-03-15 DIAGNOSIS — B9681 Helicobacter pylori [H. pylori] as the cause of diseases classified elsewhere: Secondary | ICD-10-CM | POA: Diagnosis not present

## 2020-03-15 DIAGNOSIS — D45 Polycythemia vera: Secondary | ICD-10-CM

## 2020-03-15 DIAGNOSIS — K255 Chronic or unspecified gastric ulcer with perforation: Secondary | ICD-10-CM | POA: Diagnosis not present

## 2020-03-15 DIAGNOSIS — D649 Anemia, unspecified: Secondary | ICD-10-CM | POA: Diagnosis not present

## 2020-03-15 DIAGNOSIS — R161 Splenomegaly, not elsewhere classified: Secondary | ICD-10-CM | POA: Diagnosis not present

## 2020-03-15 LAB — CMP (CANCER CENTER ONLY)
ALT: 13 U/L (ref 0–44)
AST: 25 U/L (ref 15–41)
Albumin: 4.2 g/dL (ref 3.5–5.0)
Alkaline Phosphatase: 465 U/L — ABNORMAL HIGH (ref 38–126)
Anion gap: 7 (ref 5–15)
BUN: 22 mg/dL (ref 8–23)
CO2: 27 mmol/L (ref 22–32)
Calcium: 9.3 mg/dL (ref 8.9–10.3)
Chloride: 108 mmol/L (ref 98–111)
Creatinine: 0.9 mg/dL (ref 0.61–1.24)
GFR, Est AFR Am: >60 mL/min (ref >60)
GFR, Estimated: >60 mL/min (ref >60)
Glucose, Bld: 134 mg/dL — ABNORMAL HIGH (ref 70–99)
Potassium: 5 mmol/L (ref 3.5–5.1)
Sodium: 142 mmol/L (ref 135–145)
Total Bilirubin: 1.3 mg/dL — ABNORMAL HIGH (ref 0.3–1.2)
Total Protein: 6.3 g/dL — ABNORMAL LOW (ref 6.5–8.1)

## 2020-03-15 LAB — CBC WITH DIFFERENTIAL (CANCER CENTER ONLY)
Abs Immature Granulocytes: 3.65 10*3/uL — ABNORMAL HIGH (ref 0.00–0.07)
Basophils Absolute: 0.4 10*3/uL — ABNORMAL HIGH (ref 0.0–0.1)
Basophils Relative: 2 %
Eosinophils Absolute: 0.2 10*3/uL (ref 0.0–0.5)
Eosinophils Relative: 1 %
HCT: 25 % — ABNORMAL LOW (ref 39.0–52.0)
Hemoglobin: 7.4 g/dL — ABNORMAL LOW (ref 13.0–17.0)
Immature Granulocytes: 15 %
Lymphocytes Relative: 16 %
Lymphs Abs: 3.8 10*3/uL (ref 0.7–4.0)
MCH: 33.3 pg (ref 26.0–34.0)
MCHC: 29.6 g/dL — ABNORMAL LOW (ref 30.0–36.0)
MCV: 112.6 fL — ABNORMAL HIGH (ref 80.0–100.0)
Monocytes Absolute: 0.9 10*3/uL (ref 0.1–1.0)
Monocytes Relative: 4 %
Neutro Abs: 15.6 10*3/uL — ABNORMAL HIGH (ref 1.7–7.7)
Neutrophils Relative %: 62 %
Platelet Count: 203 10*3/uL (ref 150–400)
RBC: 2.22 MIL/uL — ABNORMAL LOW (ref 4.22–5.81)
RDW: 27 % — ABNORMAL HIGH (ref 11.5–15.5)
WBC Count: 24.4 10*3/uL — ABNORMAL HIGH (ref 4.0–10.5)
nRBC: 0.6 % — ABNORMAL HIGH (ref 0.0–0.2)

## 2020-03-15 LAB — SAMPLE TO BLOOD BANK

## 2020-03-22 ENCOUNTER — Encounter: Payer: Self-pay | Admitting: Hematology & Oncology

## 2020-03-22 ENCOUNTER — Other Ambulatory Visit: Payer: Self-pay | Admitting: Hematology & Oncology

## 2020-03-22 ENCOUNTER — Inpatient Hospital Stay: Payer: Medicare PPO

## 2020-03-22 ENCOUNTER — Other Ambulatory Visit: Payer: Self-pay

## 2020-03-22 ENCOUNTER — Other Ambulatory Visit: Payer: Self-pay | Admitting: *Deleted

## 2020-03-22 ENCOUNTER — Telehealth: Payer: Self-pay | Admitting: *Deleted

## 2020-03-22 DIAGNOSIS — D649 Anemia, unspecified: Secondary | ICD-10-CM | POA: Diagnosis not present

## 2020-03-22 DIAGNOSIS — D474 Osteomyelofibrosis: Secondary | ICD-10-CM

## 2020-03-22 DIAGNOSIS — D45 Polycythemia vera: Secondary | ICD-10-CM | POA: Diagnosis not present

## 2020-03-22 DIAGNOSIS — D471 Chronic myeloproliferative disease: Secondary | ICD-10-CM

## 2020-03-22 DIAGNOSIS — B9681 Helicobacter pylori [H. pylori] as the cause of diseases classified elsewhere: Secondary | ICD-10-CM | POA: Diagnosis not present

## 2020-03-22 DIAGNOSIS — R161 Splenomegaly, not elsewhere classified: Secondary | ICD-10-CM | POA: Diagnosis not present

## 2020-03-22 DIAGNOSIS — D500 Iron deficiency anemia secondary to blood loss (chronic): Secondary | ICD-10-CM

## 2020-03-22 DIAGNOSIS — K255 Chronic or unspecified gastric ulcer with perforation: Secondary | ICD-10-CM | POA: Diagnosis not present

## 2020-03-22 HISTORY — DX: Osteomyelofibrosis: D47.4

## 2020-03-22 HISTORY — DX: Chronic myeloproliferative disease: D47.1

## 2020-03-22 LAB — CBC WITH DIFFERENTIAL (CANCER CENTER ONLY)
Abs Immature Granulocytes: 2.32 10*3/uL — ABNORMAL HIGH (ref 0.00–0.07)
Basophils Absolute: 0.3 10*3/uL — ABNORMAL HIGH (ref 0.0–0.1)
Basophils Relative: 2 %
Eosinophils Absolute: 0.1 10*3/uL (ref 0.0–0.5)
Eosinophils Relative: 1 %
HCT: 23.3 % — ABNORMAL LOW (ref 39.0–52.0)
Hemoglobin: 6.7 g/dL — CL (ref 13.0–17.0)
Immature Granulocytes: 15 %
Lymphocytes Relative: 10 %
Lymphs Abs: 1.6 10*3/uL (ref 0.7–4.0)
MCH: 33 pg (ref 26.0–34.0)
MCHC: 28.8 g/dL — ABNORMAL LOW (ref 30.0–36.0)
MCV: 114.8 fL — ABNORMAL HIGH (ref 80.0–100.0)
Monocytes Absolute: 2 10*3/uL — ABNORMAL HIGH (ref 0.1–1.0)
Monocytes Relative: 13 %
Neutro Abs: 9 10*3/uL — ABNORMAL HIGH (ref 1.7–7.7)
Neutrophils Relative %: 59 %
Platelet Count: 142 10*3/uL — ABNORMAL LOW (ref 150–400)
RBC: 2.03 MIL/uL — ABNORMAL LOW (ref 4.22–5.81)
RDW: 27.9 % — ABNORMAL HIGH (ref 11.5–15.5)
WBC Count: 15.2 10*3/uL — ABNORMAL HIGH (ref 4.0–10.5)
nRBC: 3.1 % — ABNORMAL HIGH (ref 0.0–0.2)

## 2020-03-22 LAB — CMP (CANCER CENTER ONLY)
ALT: 14 U/L (ref 0–44)
AST: 28 U/L (ref 15–41)
Albumin: 3.8 g/dL (ref 3.5–5.0)
Alkaline Phosphatase: 339 U/L — ABNORMAL HIGH (ref 38–126)
Anion gap: 9 (ref 5–15)
BUN: 25 mg/dL — ABNORMAL HIGH (ref 8–23)
CO2: 24 mmol/L (ref 22–32)
Calcium: 8.2 mg/dL — ABNORMAL LOW (ref 8.9–10.3)
Chloride: 104 mmol/L (ref 98–111)
Creatinine: 1.02 mg/dL (ref 0.61–1.24)
GFR, Est AFR Am: >60 mL/min (ref >60)
GFR, Estimated: >60 mL/min (ref >60)
Glucose, Bld: 145 mg/dL — ABNORMAL HIGH (ref 70–99)
Potassium: 4.4 mmol/L (ref 3.5–5.1)
Sodium: 137 mmol/L (ref 135–145)
Total Bilirubin: 1.4 mg/dL — ABNORMAL HIGH (ref 0.3–1.2)
Total Protein: 6.3 g/dL — ABNORMAL LOW (ref 6.5–8.1)

## 2020-03-22 LAB — SAMPLE TO BLOOD BANK

## 2020-03-22 LAB — PREPARE RBC (CROSSMATCH)

## 2020-03-22 NOTE — Telephone Encounter (Signed)
Dr. Marin Olp notified of hgb-6.7.  Orders received for pt to get two units of blood on Monday, 03/25/20.  Message sent to scheduling.

## 2020-03-25 ENCOUNTER — Other Ambulatory Visit: Payer: Self-pay

## 2020-03-25 ENCOUNTER — Inpatient Hospital Stay: Payer: Medicare PPO | Attending: Hematology & Oncology

## 2020-03-25 DIAGNOSIS — N189 Chronic kidney disease, unspecified: Secondary | ICD-10-CM | POA: Insufficient documentation

## 2020-03-25 DIAGNOSIS — B9681 Helicobacter pylori [H. pylori] as the cause of diseases classified elsewhere: Secondary | ICD-10-CM | POA: Diagnosis not present

## 2020-03-25 DIAGNOSIS — D45 Polycythemia vera: Secondary | ICD-10-CM | POA: Diagnosis not present

## 2020-03-25 DIAGNOSIS — D631 Anemia in chronic kidney disease: Secondary | ICD-10-CM | POA: Diagnosis not present

## 2020-03-25 DIAGNOSIS — K255 Chronic or unspecified gastric ulcer with perforation: Secondary | ICD-10-CM | POA: Insufficient documentation

## 2020-03-25 DIAGNOSIS — Z7982 Long term (current) use of aspirin: Secondary | ICD-10-CM | POA: Diagnosis not present

## 2020-03-25 MED ORDER — SODIUM CHLORIDE 0.9% IV SOLUTION
250.0000 mL | Freq: Once | INTRAVENOUS | Status: AC
  Administered 2020-03-25: 250 mL via INTRAVENOUS
  Filled 2020-03-25: qty 250

## 2020-03-25 NOTE — Patient Instructions (Signed)

## 2020-03-26 LAB — TYPE AND SCREEN
ABO/RH(D): B POS
Antibody Screen: NEGATIVE
Unit division: 0
Unit division: 0

## 2020-03-26 LAB — BPAM RBC
Blood Product Expiration Date: 202108272359
Blood Product Expiration Date: 202108272359
ISSUE DATE / TIME: 202108020751
ISSUE DATE / TIME: 202108020751
Unit Type and Rh: 7300
Unit Type and Rh: 7300

## 2020-03-27 ENCOUNTER — Other Ambulatory Visit: Payer: Self-pay | Admitting: Hematology & Oncology

## 2020-03-28 ENCOUNTER — Encounter: Payer: Self-pay | Admitting: *Deleted

## 2020-03-29 ENCOUNTER — Inpatient Hospital Stay: Payer: Medicare PPO

## 2020-03-29 ENCOUNTER — Other Ambulatory Visit: Payer: Self-pay

## 2020-03-29 DIAGNOSIS — B9681 Helicobacter pylori [H. pylori] as the cause of diseases classified elsewhere: Secondary | ICD-10-CM | POA: Diagnosis not present

## 2020-03-29 DIAGNOSIS — D45 Polycythemia vera: Secondary | ICD-10-CM

## 2020-03-29 DIAGNOSIS — Z7982 Long term (current) use of aspirin: Secondary | ICD-10-CM | POA: Diagnosis not present

## 2020-03-29 DIAGNOSIS — D631 Anemia in chronic kidney disease: Secondary | ICD-10-CM | POA: Diagnosis not present

## 2020-03-29 DIAGNOSIS — N189 Chronic kidney disease, unspecified: Secondary | ICD-10-CM | POA: Diagnosis not present

## 2020-03-29 DIAGNOSIS — K255 Chronic or unspecified gastric ulcer with perforation: Secondary | ICD-10-CM | POA: Diagnosis not present

## 2020-03-29 LAB — CMP (CANCER CENTER ONLY)
ALT: 15 U/L (ref 0–44)
AST: 25 U/L (ref 15–41)
Albumin: 4.1 g/dL (ref 3.5–5.0)
Alkaline Phosphatase: 330 U/L — ABNORMAL HIGH (ref 38–126)
Anion gap: 6 (ref 5–15)
BUN: 18 mg/dL (ref 8–23)
CO2: 28 mmol/L (ref 22–32)
Calcium: 9.2 mg/dL (ref 8.9–10.3)
Chloride: 107 mmol/L (ref 98–111)
Creatinine: 0.97 mg/dL (ref 0.61–1.24)
GFR, Est AFR Am: >60 mL/min (ref >60)
GFR, Estimated: >60 mL/min (ref >60)
Glucose, Bld: 125 mg/dL — ABNORMAL HIGH (ref 70–99)
Potassium: 4.7 mmol/L (ref 3.5–5.1)
Sodium: 141 mmol/L (ref 135–145)
Total Bilirubin: 1.2 mg/dL (ref 0.3–1.2)
Total Protein: 6.2 g/dL — ABNORMAL LOW (ref 6.5–8.1)

## 2020-03-29 LAB — CBC WITH DIFFERENTIAL (CANCER CENTER ONLY)
Abs Immature Granulocytes: 2.1 10*3/uL — ABNORMAL HIGH (ref 0.00–0.07)
Basophils Absolute: 0.3 10*3/uL — ABNORMAL HIGH (ref 0.0–0.1)
Basophils Relative: 2 %
Blasts: 2 %
Eosinophils Absolute: 0.2 10*3/uL (ref 0.0–0.5)
Eosinophils Relative: 1 %
HCT: 30.2 % — ABNORMAL LOW (ref 39.0–52.0)
Hemoglobin: 9.1 g/dL — ABNORMAL LOW (ref 13.0–17.0)
Lymphocytes Relative: 12 %
Lymphs Abs: 1.9 10*3/uL (ref 0.7–4.0)
MCH: 32.3 pg (ref 26.0–34.0)
MCHC: 30.1 g/dL (ref 30.0–36.0)
MCV: 107.1 fL — ABNORMAL HIGH (ref 80.0–100.0)
Metamyelocytes Relative: 10 %
Monocytes Absolute: 0.6 10*3/uL (ref 0.1–1.0)
Monocytes Relative: 4 %
Myelocytes: 3 %
Neutro Abs: 10.6 10*3/uL — ABNORMAL HIGH (ref 1.7–7.7)
Neutrophils Relative %: 66 %
Platelet Count: 123 10*3/uL — ABNORMAL LOW (ref 150–400)
RBC: 2.82 MIL/uL — ABNORMAL LOW (ref 4.22–5.81)
RDW: 24.1 % — ABNORMAL HIGH (ref 11.5–15.5)
WBC Count: 16.1 10*3/uL — ABNORMAL HIGH (ref 4.0–10.5)
nRBC: 1.5 % — ABNORMAL HIGH (ref 0.0–0.2)
nRBC: 2 /100 WBC — ABNORMAL HIGH

## 2020-03-29 LAB — SAMPLE TO BLOOD BANK

## 2020-04-04 ENCOUNTER — Other Ambulatory Visit: Payer: Self-pay

## 2020-04-04 ENCOUNTER — Inpatient Hospital Stay: Payer: Medicare PPO

## 2020-04-04 ENCOUNTER — Inpatient Hospital Stay (HOSPITAL_BASED_OUTPATIENT_CLINIC_OR_DEPARTMENT_OTHER): Payer: Medicare PPO | Admitting: Hematology & Oncology

## 2020-04-04 ENCOUNTER — Encounter: Payer: Self-pay | Admitting: Hematology & Oncology

## 2020-04-04 VITALS — BP 145/68 | HR 70 | Temp 97.7°F | Resp 17 | Wt 161.0 lb

## 2020-04-04 DIAGNOSIS — D45 Polycythemia vera: Secondary | ICD-10-CM

## 2020-04-04 DIAGNOSIS — B9681 Helicobacter pylori [H. pylori] as the cause of diseases classified elsewhere: Secondary | ICD-10-CM | POA: Diagnosis not present

## 2020-04-04 DIAGNOSIS — D474 Osteomyelofibrosis: Secondary | ICD-10-CM

## 2020-04-04 DIAGNOSIS — Z7982 Long term (current) use of aspirin: Secondary | ICD-10-CM | POA: Diagnosis not present

## 2020-04-04 DIAGNOSIS — E349 Endocrine disorder, unspecified: Secondary | ICD-10-CM | POA: Diagnosis not present

## 2020-04-04 DIAGNOSIS — K255 Chronic or unspecified gastric ulcer with perforation: Secondary | ICD-10-CM | POA: Diagnosis not present

## 2020-04-04 DIAGNOSIS — Z7189 Other specified counseling: Secondary | ICD-10-CM

## 2020-04-04 DIAGNOSIS — D631 Anemia in chronic kidney disease: Secondary | ICD-10-CM | POA: Diagnosis not present

## 2020-04-04 DIAGNOSIS — N189 Chronic kidney disease, unspecified: Secondary | ICD-10-CM | POA: Diagnosis not present

## 2020-04-04 HISTORY — DX: Anemia in chronic kidney disease: D63.1

## 2020-04-04 HISTORY — DX: Other specified counseling: Z71.89

## 2020-04-04 LAB — CMP (CANCER CENTER ONLY)
ALT: 16 U/L (ref 0–44)
AST: 24 U/L (ref 15–41)
Albumin: 4.2 g/dL (ref 3.5–5.0)
Alkaline Phosphatase: 346 U/L — ABNORMAL HIGH (ref 38–126)
Anion gap: 8 (ref 5–15)
BUN: 23 mg/dL (ref 8–23)
CO2: 29 mmol/L (ref 22–32)
Calcium: 9.5 mg/dL (ref 8.9–10.3)
Chloride: 105 mmol/L (ref 98–111)
Creatinine: 0.89 mg/dL (ref 0.61–1.24)
GFR, Est AFR Am: >60 mL/min (ref >60)
GFR, Estimated: >60 mL/min (ref >60)
Glucose, Bld: 126 mg/dL — ABNORMAL HIGH (ref 70–99)
Potassium: 4.3 mmol/L (ref 3.5–5.1)
Sodium: 142 mmol/L (ref 135–145)
Total Bilirubin: 1.2 mg/dL (ref 0.3–1.2)
Total Protein: 6.4 g/dL — ABNORMAL LOW (ref 6.5–8.1)

## 2020-04-04 LAB — CBC WITH DIFFERENTIAL (CANCER CENTER ONLY)
Abs Immature Granulocytes: 2.5 10*3/uL — ABNORMAL HIGH (ref 0.00–0.07)
Basophils Absolute: 0.4 10*3/uL — ABNORMAL HIGH (ref 0.0–0.1)
Basophils Relative: 2 %
Blasts: 2 %
Eosinophils Absolute: 0.2 10*3/uL (ref 0.0–0.5)
Eosinophils Relative: 1 %
HCT: 29.4 % — ABNORMAL LOW (ref 39.0–52.0)
Hemoglobin: 8.9 g/dL — ABNORMAL LOW (ref 13.0–17.0)
Lymphocytes Relative: 8 %
Lymphs Abs: 1.4 10*3/uL (ref 0.7–4.0)
MCH: 31.7 pg (ref 26.0–34.0)
MCHC: 30.3 g/dL (ref 30.0–36.0)
MCV: 104.6 fL — ABNORMAL HIGH (ref 80.0–100.0)
Metamyelocytes Relative: 10 %
Monocytes Absolute: 1.4 10*3/uL — ABNORMAL HIGH (ref 0.1–1.0)
Monocytes Relative: 8 %
Myelocytes: 4 %
Neutro Abs: 11.6 10*3/uL — ABNORMAL HIGH (ref 1.7–7.7)
Neutrophils Relative %: 65 %
Platelet Count: 145 10*3/uL — ABNORMAL LOW (ref 150–400)
RBC: 2.81 MIL/uL — ABNORMAL LOW (ref 4.22–5.81)
RDW: 23.9 % — ABNORMAL HIGH (ref 11.5–15.5)
WBC Count: 17.9 10*3/uL — ABNORMAL HIGH (ref 4.0–10.5)
nRBC: 1.6 % — ABNORMAL HIGH (ref 0.0–0.2)

## 2020-04-04 MED ORDER — RUXOLITINIB PHOSPHATE 10 MG PO TABS: 10.0000 mg | ORAL_TABLET | Freq: Two times a day (BID) | ORAL | 6 refills | Status: DC

## 2020-04-04 MED FILL — JAKAFI 10 MG TABLET: 10 | 30 days supply | Qty: 60 | Fill #0

## 2020-04-04 NOTE — Progress Notes (Signed)
Hematology and Oncology Follow Up Visit  Benjamin [name] [id number] 1950/11/27 69 y.o. 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  Thankfully, he is doing better.  We had to transfuse him a couple weeks ago.  Thankfully, his hemoglobin is holding pretty steady right now.  There has been no problems with nausea or vomiting.  He feels a spleen is improving.  He has had no diarrhea.  He has had a little bit of a rash which is localized on the left flank.  This does not itch or burn.  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  He has had a good appetite.  He has had no mouth sores.  He has had no dysphagia or odynophagia.  Of note, his erythropoietin level is only 56.  As such, we can probably utilize ESA to try to help with his blood count.  He has had no obvious bleeding.  He has had no fever.  He has had a little bit of leg swelling which has been chronic.  Currently, his performance status is ECOG 1.  2+, we will now have to start [facility] on him.  He actually feels well today.  We did transfuse him when we last saw him.  This helped quite a bit.  I am sending off an erythropoietin level on him to see if we might be able to use ESA for anemia.  I talked him about Jakafi.  I told him how it worked.  I really believe that Benjamin [name] will really help with his splenomegaly and get his bone marrow less active.  He has had no fever.  His appetite is good.  Has had no problems with nausea or vomiting.  He does have some swelling in the legs.  He is on a  testosterone patch.  Hopefully, this is helped him feel a bit better.  Hopefully this will help with erythropoiesis.  Currently, I would say his performance status is ECOG 1.    Medications:  Allergies as of 04/04/2020      Reactions   Aleve [naproxen Sodium] Other (See Comments)   Pt thinks it might be all NSAIDs but is unsure. Caused him to have emergency stomach surgery.   Biaxin [clarithromycin] Other (See Comments)   Joint pains   Cephalexin Hives, Rash   Flagyl [metronidazole] Other (See Comments)   Joint pain   Morphine And Related Hives      Medication List       Accurate as of April 04, 2020  2:52 PM. If you have any questions, ask your nurse or doctor.        aspirin 81 MG tablet Take 81 mg by mouth every morning.   colchicine 0.6 MG tablet Take 1 tablet (0.6 mg total) by mouth daily.   hydroxyurea 500 MG capsule Commonly known as: HYDREA Take 1 capsule (500 mg total) by mouth daily. May take with food to minimize GI side effects.   Magnesium 400 MG Caps Take 1 capsule  by mouth daily.   ruxolitinib phosphate 10 MG tablet Commonly known as: JAKAFI Take 1 tablet (10 mg total) by mouth 2 (two) times daily.   testosterone 4 MG/24HR Pt24 patch Commonly known as: ANDRODERM Place 1 patch onto the skin daily.   vitamin C 100 MG tablet Take 100 mg by mouth daily.   Vitamin D 50 MCG (2000 UT) Caps Take by mouth.       Allergies:  Allergies  Allergen Reactions  . Aleve [Naproxen Sodium] Other (See Comments)    Pt thinks it might be all NSAIDs but is unsure. Caused him to have emergency stomach surgery.  . Biaxin [Clarithromycin] Other (See Comments)    Joint pains  . Cephalexin Hives and Rash  . Flagyl [Metronidazole] Other (See Comments)    Joint pain  . Morphine And Related Hives    Past Medical History, Surgical history, Social history, and Family History were reviewed and updated.  Review of Systems:  Review of Systems  Constitutional:  Negative.   HENT: Negative.   Eyes: Negative.   Respiratory: Negative.   Cardiovascular: Negative.   Gastrointestinal: Negative.   Genitourinary: Negative.   Musculoskeletal: Negative.   Skin: Negative.   Neurological: Negative.   Endo/Heme/Allergies: Negative.   Psychiatric/Behavioral: Negative.     Physical Exam:  weight is 161 lb (73 kg). His oral temperature is 97.7 F (36.5 C). His blood pressure is 145/68 (abnormal) and his pulse is 70. His respiration is 17 and oxygen saturation is 100%.   Wt Readings from Last 3 Encounters:  04/04/20 161 lb (73 kg)  03/01/20 161 lb (73 kg)  02/20/20 167 lb (75.8 kg)    Physical Exam Vitals reviewed.  HENT:     Head: Normocephalic and atraumatic.  Eyes:     Pupils: Pupils are equal, round, and reactive to light.  Cardiovascular:     Rate and Rhythm: Normal rate and regular rhythm.     Heart sounds: Normal heart sounds.  Pulmonary:     Effort: Pulmonary effort is normal.     Breath sounds: Normal breath sounds.  Abdominal:     General: Bowel sounds are normal.     Palpations: Abdomen is soft.     Comments: Abdominal exam shows a soft abdomen.  He has decent bowel sounds.  There is no fluid wave.  His spleen tip is down to the umbilicus.  There is no hepatomegaly.  Musculoskeletal:        General: No tenderness or deformity. Normal range of motion.     Cervical back: Normal range of motion.     Comments: He does have about 1-2+ edema in his legs bilaterally.  Lymphadenopathy:     Cervical: No cervical adenopathy.  Skin:    General: Skin is warm and dry.     Findings: No erythema or rash.  Neurological:     Mental Status: He is alert and oriented to person, place, and time.  Psychiatric:        Behavior: Behavior normal.        Thought Content: Thought content normal.        Judgment: Judgment normal.    Lab Results  Component Value Date   WBC 17.9 (H) 04/04/2020   HGB 8.9 (L) 04/04/2020   HCT 29.4 (L) 04/04/2020    MCV 104.6 (H) 04/04/2020   PLT 145 (L) 04/04/2020   Lab Results  Component Value Date   FERRITIN 714 (H) 03/01/2020   IRON 92 03/01/2020  TIBC 236 03/01/2020   UIBC 144 03/01/2020   IRONPCTSAT 39 03/01/2020   Lab Results  Component Value Date   RETICCTPCT 7.4 (H) 03/01/2020   RBC 2.81 (L) 04/04/2020   RETICCTABS 173.6 02/27/2015   No results found for: KPAFRELGTCHN, LAMBDASER, KAPLAMBRATIO No results found for: IGGSERUM, IGA, IGMSERUM No results found for: Kathrynn Ducking, MSPIKE, SPEI   Chemistry      Component Value Date/Time   NA 141 03/29/2020 1416   NA 141 07/19/2017 1417   NA 141 05/14/2016 1202   K 4.7 03/29/2020 1416   K 4.6 07/19/2017 1417   K 4.8 05/14/2016 1202   CL 107 03/29/2020 1416   CL 103 07/19/2017 1417   CO2 28 03/29/2020 1416   CO2 30 07/19/2017 1417   CO2 26 05/14/2016 1202   BUN 18 03/29/2020 1416   BUN 13 07/19/2017 1417   BUN 14.0 05/14/2016 1202   CREATININE 0.97 03/29/2020 1416   CREATININE 0.9 07/19/2017 1417   CREATININE 1.0 05/14/2016 1202      Component Value Date/Time   CALCIUM 9.2 03/29/2020 1416   CALCIUM 9.1 07/19/2017 1417   CALCIUM 9.2 05/14/2016 1202   ALKPHOS 330 (H) 03/29/2020 1416   ALKPHOS 870 (H) 07/19/2017 1417   ALKPHOS 913 (H) 05/14/2016 1202   AST 25 03/29/2020 1416   AST 39 (H) 05/14/2016 1202   ALT 15 03/29/2020 1416   ALT 50 (H) 07/19/2017 1417   ALT 41 05/14/2016 1202   BILITOT 1.2 03/29/2020 1416   BILITOT 1.00 05/14/2016 1202      Impression and Plan: Mr. Milian is a very pleasant 69 yo caucasian gentleman with polycythemia vera, JAK2 positive.   His polycythemia clearly has transformed.  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  I looked at his blood smear.  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  The blood smear does not look as "active" as it had previously.  I think that Aranesp will help with his anemia.  We will hold off on it  for right now.  He will still come in weekly for lab work.  We will have to see how his hemoglobin trends.  I will not do any ultrasounds on him as of yet.  He has only been on the [facility] for about a month or so.  I would like to probably get an ultrasound of his abdomen in October.  I would like to see him back in about 4 weeks or so.  Volanda Napoleon, MD 8/12/20212:52 PM

## 2020-04-05 ENCOUNTER — Other Ambulatory Visit: Payer: Medicare PPO

## 2020-04-05 ENCOUNTER — Other Ambulatory Visit: Payer: Self-pay | Admitting: Family

## 2020-04-05 LAB — IRON AND TIBC
Iron: 80 ug/dL (ref 42–163)
Saturation Ratios: 34 % (ref 20–55)
TIBC: 239 ug/dL (ref 202–409)
UIBC: 158 ug/dL (ref 117–376)

## 2020-04-05 LAB — SAMPLE TO BLOOD BANK

## 2020-04-05 LAB — FERRITIN: Ferritin: 748 ng/mL — ABNORMAL HIGH (ref 24–336)

## 2020-04-05 LAB — LACTATE DEHYDROGENASE: LDH: 1298 U/L — ABNORMAL HIGH (ref 98–192)

## 2020-04-12 ENCOUNTER — Other Ambulatory Visit: Payer: Self-pay

## 2020-04-12 ENCOUNTER — Inpatient Hospital Stay (HOSPITAL_BASED_OUTPATIENT_CLINIC_OR_DEPARTMENT_OTHER): Payer: Medicare PPO | Admitting: Hematology & Oncology

## 2020-04-12 ENCOUNTER — Inpatient Hospital Stay: Payer: Medicare PPO

## 2020-04-12 ENCOUNTER — Encounter: Payer: Self-pay | Admitting: Hematology & Oncology

## 2020-04-12 VITALS — BP 126/59 | HR 80 | Temp 98.5°F | Resp 18 | Ht 72.0 in | Wt 161.2 lb

## 2020-04-12 DIAGNOSIS — Z7982 Long term (current) use of aspirin: Secondary | ICD-10-CM | POA: Diagnosis not present

## 2020-04-12 DIAGNOSIS — D45 Polycythemia vera: Secondary | ICD-10-CM | POA: Diagnosis not present

## 2020-04-12 DIAGNOSIS — B9681 Helicobacter pylori [H. pylori] as the cause of diseases classified elsewhere: Secondary | ICD-10-CM | POA: Diagnosis not present

## 2020-04-12 DIAGNOSIS — D474 Osteomyelofibrosis: Secondary | ICD-10-CM

## 2020-04-12 DIAGNOSIS — K255 Chronic or unspecified gastric ulcer with perforation: Secondary | ICD-10-CM | POA: Diagnosis not present

## 2020-04-12 DIAGNOSIS — D631 Anemia in chronic kidney disease: Secondary | ICD-10-CM | POA: Diagnosis not present

## 2020-04-12 DIAGNOSIS — N189 Chronic kidney disease, unspecified: Secondary | ICD-10-CM | POA: Diagnosis not present

## 2020-04-12 LAB — CBC WITH DIFFERENTIAL (CANCER CENTER ONLY)
Abs Immature Granulocytes: 2.88 10*3/uL — ABNORMAL HIGH (ref 0.00–0.07)
Basophils Absolute: 0.2 10*3/uL — ABNORMAL HIGH (ref 0.0–0.1)
Basophils Relative: 1 %
Eosinophils Absolute: 0.1 10*3/uL (ref 0.0–0.5)
Eosinophils Relative: 0 %
HCT: 24.7 % — ABNORMAL LOW (ref 39.0–52.0)
Hemoglobin: 7.5 g/dL — ABNORMAL LOW (ref 13.0–17.0)
Immature Granulocytes: 17 %
Lymphocytes Relative: 9 %
Lymphs Abs: 1.5 10*3/uL (ref 0.7–4.0)
MCH: 31.6 pg (ref 26.0–34.0)
MCHC: 30.4 g/dL (ref 30.0–36.0)
MCV: 104.2 fL — ABNORMAL HIGH (ref 80.0–100.0)
Monocytes Absolute: 3.2 10*3/uL — ABNORMAL HIGH (ref 0.1–1.0)
Monocytes Relative: 19 %
Neutro Abs: 9.4 10*3/uL — ABNORMAL HIGH (ref 1.7–7.7)
Neutrophils Relative %: 54 %
Platelet Count: 134 10*3/uL — ABNORMAL LOW (ref 150–400)
RBC: 2.37 MIL/uL — ABNORMAL LOW (ref 4.22–5.81)
RDW: 24.1 % — ABNORMAL HIGH (ref 11.5–15.5)
WBC Count: 17.3 10*3/uL — ABNORMAL HIGH (ref 4.0–10.5)
WBC Morphology: 5
nRBC: 2.9 % — ABNORMAL HIGH (ref 0.0–0.2)

## 2020-04-12 LAB — CMP (CANCER CENTER ONLY)
ALT: 11 U/L (ref 0–44)
AST: 20 U/L (ref 15–41)
Albumin: 4.2 g/dL (ref 3.5–5.0)
Alkaline Phosphatase: 288 U/L — ABNORMAL HIGH (ref 38–126)
Anion gap: 8 (ref 5–15)
BUN: 24 mg/dL — ABNORMAL HIGH (ref 8–23)
CO2: 28 mmol/L (ref 22–32)
Calcium: 9.2 mg/dL (ref 8.9–10.3)
Chloride: 106 mmol/L (ref 98–111)
Creatinine: 1 mg/dL (ref 0.61–1.24)
GFR, Est AFR Am: >60 mL/min (ref >60)
GFR, Estimated: >60 mL/min (ref >60)
Glucose, Bld: 108 mg/dL — ABNORMAL HIGH (ref 70–99)
Potassium: 4.7 mmol/L (ref 3.5–5.1)
Sodium: 142 mmol/L (ref 135–145)
Total Bilirubin: 1.1 mg/dL (ref 0.3–1.2)
Total Protein: 6.6 g/dL (ref 6.5–8.1)

## 2020-04-12 LAB — SAMPLE TO BLOOD BANK

## 2020-04-12 MED ORDER — EPOETIN ALFA-EPBX 40000 UNIT/ML IJ SOLN
INTRAMUSCULAR | Status: AC
  Filled 2020-04-12: qty 1

## 2020-04-12 MED ORDER — EPOETIN ALFA-EPBX 40000 UNIT/ML IJ SOLN
40000.0000 [IU] | Freq: Once | INTRAMUSCULAR | Status: AC
  Administered 2020-04-12: 40000 [IU] via SUBCUTANEOUS

## 2020-04-12 NOTE — Patient Instructions (Signed)
Reticulocyte Count Test Why am I having this test? Reticulocytes are new, or immature, red blood cells (RBCs) that are made by your bone marrow. The reticulocyte count test may be used to help determine your bone marrow's ability to produce RBCs in response to a condition in which you have a low blood cell count (anemia). The test may also be done if you have an abnormally high RBC count or a condition that causes other forms of bone marrow dysfunction. Some of these conditions include:  Excessive blood loss (hemorrhage).  A condition that causes your body to break down RBCs too quickly (hemolysis).  A type of anemia that affects how RBCs are formed.  Cancer. What is being tested? This test measures the amount of reticulocytes in your blood. What kind of sample is taken?  A blood sample is required for this test. It is usually collected by inserting a needle into a blood vessel or by sticking a finger with a small needle. Tell a health care provider about:  Any medical conditions you have.  Whether you are pregnant or may be pregnant. How are the results reported? Your test results will be reported as a percentage of the total number of RBCs that are reticulocytes. Your health care provider will compare your results to normal ranges that were established after testing a large group of people (reference ranges). Reference ranges may vary among labs and hospitals. For this test, common reference ranges are:  Adult, elderly, or child: 0.5-2.0% of the total number of RBCs.  Infant: 0.5-3.1% of the total number of RBCs.  Newborn: 2.5-6.5% of the total number of RBCs. What do the results mean? Results that are within the reference range are considered normal. A reticulocyte count that is higher than the reference range can result from:  Hemolytic anemia. This is a condition in which you have a low RBC count because RBCs are being destroyed more quickly than  normal.  Hemorrhage.  Hemolytic disease of the newborn.  Treatment for deficiencies of: ? Iron. ? Vitamin I94. ? Folic acid (folate). Test results that are lower than the reference range can indicate:  Pernicious anemia and folate deficiency.  Iron-deficiency anemia.  Aplastic anemia. In this type of anemia, the creation of all types of blood cells by the bone marrow is abnormally low.  A complication of radiation therapy.  Cancer.  Bone marrow failure.  Abnormal function of adrenal glands.  Abnormal function of the pituitary gland.  Chronic diseases. Talk with your health care provider about what your results mean. Questions to ask your health care provider Ask your health care provider, or the department that is doing the test:  When will my results be ready?  How will I get my results?  What are my treatment options?  What other tests do I need?  What are my next steps? Summary  Reticulocytes are new, or immature, red blood cells (RBCs) that are made by your bone marrow.  The reticulocyte count test may be used to help determine your bone marrow's ability to produce RBCs in response to a condition in which you have a low blood cell count (anemia).  Results that are higher or lower than the reference range may help your health care provider diagnose certain diseases.  Make sure you talk with your health care provider about what your results mean. This information is not intended to replace advice given to you by your health care provider. Make sure you discuss any questions you  have with your health care provider. Document Revised: 05/13/2017 Document Reviewed: 05/13/2017 Elsevier Patient Education  2020 [employer].

## 2020-04-12 NOTE — Progress Notes (Signed)
Hematology and Oncology Follow Up Visit  Benjamin [name] [id number] Nov 21, 1950 69 y.o. 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 Vandeusen is here today for follow-up.  I just want to talk to him about the Aranesp.  We did do a erythropoietin level on him.  The erythropoietin level was only 59.  This is inappropriately low for his degree of anemia.  I think that Aranesp will help.  I really do not see any downside to doing the Aranesp.  His blood pressure is doing well.  He just does not have the ability to make red blood cells on his own.  He feels okay.  His hemoglobin is 7.5.  He does not think he needs to be transfused.  He is doing okay on the [place].  He has had no problems with Jakafi.  He thinks his spleen is a little bit less notable.  He has had no diarrhea.  He has had the leg swelling.  Hopefully we can get his anemia better, the leg swelling will improve.  Overall, his performance status is ECOG 1.   Medications:  Allergies as of 04/12/2020      Reactions   Aleve [naproxen Sodium] Other (See Comments)   Pt thinks it might be all NSAIDs but is unsure. Caused him to have emergency stomach surgery.   Biaxin [clarithromycin] Other (See Comments)   Joint pains   Cephalexin Hives, Rash   Flagyl [metronidazole] Other (See Comments)   Joint pain   Morphine And Related Hives      Medication List       Accurate as of April 12, 2020  3:44 PM. If you have any questions, ask your nurse or doctor.        aspirin 81 MG tablet Take 81 mg by mouth every morning.   colchicine 0.6 MG tablet Take 1 tablet (0.6 mg total) by  mouth daily.   hydroxyurea 500 MG capsule Commonly known as: HYDREA Take 1 capsule (500 mg total) by mouth daily. May take with food to minimize GI side effects.   Magnesium 400 MG Caps Take 1 capsule by mouth daily.   ruxolitinib phosphate 10 MG tablet Commonly known as: JAKAFI Take 1 tablet (10 mg total) by mouth 2 (two) times daily.   testosterone 4 MG/24HR Pt24 patch Commonly known as: ANDRODERM Place 1 patch onto the skin daily.   vitamin C 100 MG tablet Take 100 mg by mouth daily.   Vitamin D 50 MCG (2000 UT) Caps Take by mouth.       Allergies:  Allergies  Allergen Reactions   Aleve [Naproxen Sodium] Other (See Comments)    Pt thinks it might be all NSAIDs but is unsure. Caused him to have emergency stomach surgery.   Biaxin [Clarithromycin] Other (See Comments)    Joint pains   Cephalexin Hives and Rash   Flagyl [Metronidazole] Other (See Comments)    Joint pain   Morphine And Related Hives    Past Medical History, Surgical history, Social history, and Family History were reviewed and updated.  Review of Systems:  Review of Systems  Constitutional: Negative.   HENT: Negative.   Eyes: Negative.   Respiratory: Negative.   Cardiovascular: Negative.   Gastrointestinal: Negative.   Genitourinary: Negative.   Musculoskeletal: Negative.   Skin: Negative.   Neurological: Negative.   Endo/Heme/Allergies: Negative.   Psychiatric/Behavioral: Negative.     Physical Exam:  height is 6' (1.829 m) and weight is 161 lb 3.2 oz (73.1 kg). His oral temperature is 98.5 F (36.9 C). His blood pressure is 126/59 (abnormal) and his pulse is 80. His respiration is 18 and oxygen saturation is 100%.   Wt Readings from Last 3 Encounters:  04/12/20 161 lb 3.2 oz (73.1 kg)  04/04/20 161 lb (73 kg)  03/01/20 161 lb (73 kg)    Physical Exam Vitals reviewed.  HENT:     Head: Normocephalic and atraumatic.  Eyes:     Pupils: Pupils are equal, round, and reactive to  light.  Cardiovascular:     Rate and Rhythm: Normal rate and regular rhythm.     Heart sounds: Normal heart sounds.  Pulmonary:     Effort: Pulmonary effort is normal.     Breath sounds: Normal breath sounds.  Abdominal:     General: Bowel sounds are normal.     Palpations: Abdomen is soft.     Comments: Abdominal exam shows a soft abdomen.  He has decent bowel sounds.  There is no fluid wave.  His spleen tip is down to the umbilicus.  There is no hepatomegaly.  Musculoskeletal:        General: No tenderness or deformity. Normal range of motion.     Cervical back: Normal range of motion.     Comments: He does have about 1-2+ edema in his legs bilaterally.  Lymphadenopathy:     Cervical: No cervical adenopathy.  Skin:    General: Skin is warm and dry.     Findings: No erythema or rash.  Neurological:     Mental Status: He is alert and oriented to person, place, and time.  Psychiatric:        Behavior: Behavior normal.        Thought Content: Thought content normal.        Judgment: Judgment normal.    Lab Results  Component Value Date   WBC 17.3 (H) 04/12/2020   HGB 7.5 (L) 04/12/2020   HCT 24.7 (L) 04/12/2020   MCV 104.2 (H) 04/12/2020   PLT 134 (L) 04/12/2020   Lab Results  Component Value Date   FERRITIN 748 (H) 04/04/2020   IRON 80 04/04/2020   TIBC 239 04/04/2020   UIBC 158 04/04/2020   IRONPCTSAT 34 04/04/2020   Lab Results  Component Value Date   RETICCTPCT 7.4 (H) 03/01/2020   RBC 2.37 (L) 04/12/2020   RETICCTABS 173.6 02/27/2015   No results found for: KPAFRELGTCHN, LAMBDASER, KAPLAMBRATIO No results found for: IGGSERUM, IGA, IGMSERUM No results found for: Odetta Pink, SPEI   Chemistry      Component Value Date/Time   NA 142 04/12/2020 1418   NA 141 07/19/2017 1417   NA 141 05/14/2016 1202   K 4.7 04/12/2020 1418   K 4.6 07/19/2017 1417   K 4.8 05/14/2016 1202   CL 106 04/12/2020 1418   CL  103 07/19/2017 1417   CO2 28 04/12/2020 1418   CO2 30 07/19/2017 1417   CO2 26 05/14/2016 1202   BUN 24 (H) 04/12/2020 1418   BUN 13 07/19/2017 1417  BUN 14.0 05/14/2016 1202   CREATININE 1.00 04/12/2020 1418   CREATININE 0.9 07/19/2017 1417   CREATININE 1.0 05/14/2016 1202      Component Value Date/Time   CALCIUM 9.2 04/12/2020 1418   CALCIUM 9.1 07/19/2017 1417   CALCIUM 9.2 05/14/2016 1202   ALKPHOS 288 (H) 04/12/2020 1418   ALKPHOS 870 (H) 07/19/2017 1417   ALKPHOS 913 (H) 05/14/2016 1202   AST 20 04/12/2020 1418   AST 39 (H) 05/14/2016 1202   ALT 11 04/12/2020 1418   ALT 50 (H) 07/19/2017 1417   ALT 41 05/14/2016 1202   BILITOT 1.1 04/12/2020 1418   BILITOT 1.00 05/14/2016 1202      Impression and Plan: Mr. Heslin is a very pleasant 69 yo caucasian gentleman with polycythemia vera, JAK2 positive.   His polycythemia clearly has transformed.  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  I looked at his blood smear.  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  The blood smear does not look as "active" as it had previously.  I think that Aranesp will help with his anemia.  He will get his first dose today.  He will still come in weekly for lab work.  We will have to see how his hemoglobin trends.  We will plan to get him back to see [place] in another few weeks.   Volanda Napoleon, MD 8/20/20213:44 PM

## 2020-04-15 ENCOUNTER — Telehealth: Payer: Self-pay | Admitting: Hematology & Oncology

## 2020-04-15 NOTE — Telephone Encounter (Signed)
Appointments scheduled calendar printed per 8/23 los

## 2020-04-15 NOTE — Telephone Encounter (Signed)
NO LOS 8/20  Previous entry was entered in error

## 2020-04-19 ENCOUNTER — Telehealth: Payer: Self-pay | Admitting: *Deleted

## 2020-04-19 ENCOUNTER — Inpatient Hospital Stay: Payer: Medicare PPO

## 2020-04-19 ENCOUNTER — Other Ambulatory Visit: Payer: Self-pay

## 2020-04-19 ENCOUNTER — Other Ambulatory Visit: Payer: Self-pay | Admitting: *Deleted

## 2020-04-19 VITALS — BP 131/50 | HR 75 | Resp 17 | Wt 162.0 lb

## 2020-04-19 DIAGNOSIS — D631 Anemia in chronic kidney disease: Secondary | ICD-10-CM | POA: Diagnosis not present

## 2020-04-19 DIAGNOSIS — D45 Polycythemia vera: Secondary | ICD-10-CM

## 2020-04-19 DIAGNOSIS — B9681 Helicobacter pylori [H. pylori] as the cause of diseases classified elsewhere: Secondary | ICD-10-CM | POA: Diagnosis not present

## 2020-04-19 DIAGNOSIS — N189 Chronic kidney disease, unspecified: Secondary | ICD-10-CM | POA: Diagnosis not present

## 2020-04-19 DIAGNOSIS — Z7982 Long term (current) use of aspirin: Secondary | ICD-10-CM | POA: Diagnosis not present

## 2020-04-19 DIAGNOSIS — K255 Chronic or unspecified gastric ulcer with perforation: Secondary | ICD-10-CM | POA: Diagnosis not present

## 2020-04-19 DIAGNOSIS — D474 Osteomyelofibrosis: Secondary | ICD-10-CM

## 2020-04-19 DIAGNOSIS — D500 Iron deficiency anemia secondary to blood loss (chronic): Secondary | ICD-10-CM

## 2020-04-19 LAB — CMP (CANCER CENTER ONLY)
ALT: 13 U/L (ref 0–44)
AST: 17 U/L (ref 15–41)
Albumin: 4.1 g/dL (ref 3.5–5.0)
Alkaline Phosphatase: 295 U/L — ABNORMAL HIGH (ref 38–126)
Anion gap: 8 (ref 5–15)
BUN: 20 mg/dL (ref 8–23)
CO2: 27 mmol/L (ref 22–32)
Calcium: 8.7 mg/dL — ABNORMAL LOW (ref 8.9–10.3)
Chloride: 106 mmol/L (ref 98–111)
Creatinine: 0.89 mg/dL (ref 0.61–1.24)
GFR, Est AFR Am: >60 mL/min (ref >60)
GFR, Estimated: >60 mL/min (ref >60)
Glucose, Bld: 140 mg/dL — ABNORMAL HIGH (ref 70–99)
Potassium: 4.9 mmol/L (ref 3.5–5.1)
Sodium: 141 mmol/L (ref 135–145)
Total Bilirubin: 1.3 mg/dL — ABNORMAL HIGH (ref 0.3–1.2)
Total Protein: 6.4 g/dL — ABNORMAL LOW (ref 6.5–8.1)

## 2020-04-19 LAB — CBC WITH DIFFERENTIAL (CANCER CENTER ONLY)
Abs Immature Granulocytes: 1.7 10*3/uL — ABNORMAL HIGH (ref 0.00–0.07)
Basophils Absolute: 0.2 10*3/uL — ABNORMAL HIGH (ref 0.0–0.1)
Basophils Relative: 1 %
Blasts: 5 %
Eosinophils Absolute: 0.2 10*3/uL (ref 0.0–0.5)
Eosinophils Relative: 1 %
HCT: 22.4 % — ABNORMAL LOW (ref 39.0–52.0)
Hemoglobin: 6.7 g/dL — CL (ref 13.0–17.0)
Lymphocytes Relative: 11 %
Lymphs Abs: 1.8 10*3/uL (ref 0.7–4.0)
MCH: 31.5 pg (ref 26.0–34.0)
MCHC: 29.9 g/dL — ABNORMAL LOW (ref 30.0–36.0)
MCV: 105.2 fL — ABNORMAL HIGH (ref 80.0–100.0)
Metamyelocytes Relative: 6 %
Monocytes Absolute: 1.8 10*3/uL — ABNORMAL HIGH (ref 0.1–1.0)
Monocytes Relative: 11 %
Myelocytes: 4 %
Neutro Abs: 10.2 10*3/uL — ABNORMAL HIGH (ref 1.7–7.7)
Neutrophils Relative %: 61 %
Platelet Count: 104 10*3/uL — ABNORMAL LOW (ref 150–400)
RBC: 2.13 MIL/uL — ABNORMAL LOW (ref 4.22–5.81)
RDW: 25.1 % — ABNORMAL HIGH (ref 11.5–15.5)
WBC Count: 16.8 10*3/uL — ABNORMAL HIGH (ref 4.0–10.5)
nRBC: 3.9 % — ABNORMAL HIGH (ref 0.0–0.2)

## 2020-04-19 LAB — SAMPLE TO BLOOD BANK

## 2020-04-19 LAB — PREPARE RBC (CROSSMATCH)

## 2020-04-19 MED ORDER — EPOETIN ALFA-EPBX 40000 UNIT/ML IJ SOLN
40000.0000 [IU] | Freq: Once | INTRAMUSCULAR | Status: AC
  Administered 2020-04-19: 40000 [IU] via SUBCUTANEOUS

## 2020-04-19 MED ORDER — EPOETIN ALFA-EPBX 40000 UNIT/ML IJ SOLN
INTRAMUSCULAR | Status: AC
  Filled 2020-04-19: qty 1

## 2020-04-19 NOTE — Patient Instructions (Signed)

## 2020-04-19 NOTE — Telephone Encounter (Signed)
Dr. Marin Olp notified of HGB-6.7.  Order received for patient to get one unit of blood on Monday, 04/22/20 per Dr. Marin Olp.  Message sent to scheduling.

## 2020-04-22 ENCOUNTER — Inpatient Hospital Stay: Payer: Medicare PPO

## 2020-04-22 ENCOUNTER — Other Ambulatory Visit: Payer: Self-pay

## 2020-04-22 DIAGNOSIS — D631 Anemia in chronic kidney disease: Secondary | ICD-10-CM

## 2020-04-22 DIAGNOSIS — Z7982 Long term (current) use of aspirin: Secondary | ICD-10-CM | POA: Diagnosis not present

## 2020-04-22 DIAGNOSIS — K255 Chronic or unspecified gastric ulcer with perforation: Secondary | ICD-10-CM | POA: Diagnosis not present

## 2020-04-22 DIAGNOSIS — D45 Polycythemia vera: Secondary | ICD-10-CM

## 2020-04-22 DIAGNOSIS — B9681 Helicobacter pylori [H. pylori] as the cause of diseases classified elsewhere: Secondary | ICD-10-CM | POA: Diagnosis not present

## 2020-04-22 DIAGNOSIS — D500 Iron deficiency anemia secondary to blood loss (chronic): Secondary | ICD-10-CM

## 2020-04-22 DIAGNOSIS — N189 Chronic kidney disease, unspecified: Secondary | ICD-10-CM | POA: Diagnosis not present

## 2020-04-22 DIAGNOSIS — D474 Osteomyelofibrosis: Secondary | ICD-10-CM

## 2020-04-22 NOTE — Patient Instructions (Signed)

## 2020-04-23 ENCOUNTER — Other Ambulatory Visit: Payer: Self-pay | Admitting: Hematology & Oncology

## 2020-04-23 LAB — TYPE AND SCREEN
ABO/RH(D): B POS
Antibody Screen: NEGATIVE
Unit division: 0

## 2020-04-23 LAB — BPAM RBC
Blood Product Expiration Date: 202109192359
ISSUE DATE / TIME: 202108300804
Unit Type and Rh: 7300

## 2020-04-26 ENCOUNTER — Inpatient Hospital Stay: Payer: Medicare PPO

## 2020-04-26 ENCOUNTER — Other Ambulatory Visit: Payer: Self-pay

## 2020-04-26 ENCOUNTER — Inpatient Hospital Stay: Payer: Medicare PPO | Attending: Hematology & Oncology

## 2020-04-26 VITALS — BP 131/61 | HR 69 | Temp 97.9°F | Resp 18

## 2020-04-26 DIAGNOSIS — D45 Polycythemia vera: Secondary | ICD-10-CM

## 2020-04-26 DIAGNOSIS — N189 Chronic kidney disease, unspecified: Secondary | ICD-10-CM | POA: Insufficient documentation

## 2020-04-26 DIAGNOSIS — D631 Anemia in chronic kidney disease: Secondary | ICD-10-CM | POA: Insufficient documentation

## 2020-04-26 LAB — CBC WITH DIFFERENTIAL (CANCER CENTER ONLY)
Abs Immature Granulocytes: 2.4 10*3/uL — ABNORMAL HIGH (ref 0.00–0.07)
Basophils Absolute: 0.3 10*3/uL — ABNORMAL HIGH (ref 0.0–0.1)
Basophils Relative: 2 %
Blasts: 4 %
Eosinophils Absolute: 0.2 10*3/uL (ref 0.0–0.5)
Eosinophils Relative: 1 %
HCT: 24.6 % — ABNORMAL LOW (ref 39.0–52.0)
Hemoglobin: 7.4 g/dL — ABNORMAL LOW (ref 13.0–17.0)
Lymphocytes Relative: 18 %
Lymphs Abs: 2.9 10*3/uL (ref 0.7–4.0)
MCH: 30.6 pg (ref 26.0–34.0)
MCHC: 30.1 g/dL (ref 30.0–36.0)
MCV: 101.7 fL — ABNORMAL HIGH (ref 80.0–100.0)
Metamyelocytes Relative: 8 %
Monocytes Absolute: 1.6 10*3/uL — ABNORMAL HIGH (ref 0.1–1.0)
Monocytes Relative: 10 %
Myelocytes: 6 %
Neutro Abs: 8 10*3/uL — ABNORMAL HIGH (ref 1.7–7.7)
Neutrophils Relative %: 50 %
Platelet Count: 98 10*3/uL — ABNORMAL LOW (ref 150–400)
Promyelocytes Relative: 1 %
RBC: 2.42 MIL/uL — ABNORMAL LOW (ref 4.22–5.81)
RDW: 24.4 % — ABNORMAL HIGH (ref 11.5–15.5)
WBC Count: 16 10*3/uL — ABNORMAL HIGH (ref 4.0–10.5)
nRBC: 3.1 % — ABNORMAL HIGH (ref 0.0–0.2)

## 2020-04-26 LAB — CMP (CANCER CENTER ONLY)
ALT: 13 U/L (ref 0–44)
AST: 18 U/L (ref 15–41)
Albumin: 4.1 g/dL (ref 3.5–5.0)
Alkaline Phosphatase: 302 U/L — ABNORMAL HIGH (ref 38–126)
Anion gap: 6 (ref 5–15)
BUN: 26 mg/dL — ABNORMAL HIGH (ref 8–23)
CO2: 27 mmol/L (ref 22–32)
Calcium: 9.3 mg/dL (ref 8.9–10.3)
Chloride: 105 mmol/L (ref 98–111)
Creatinine: 0.82 mg/dL (ref 0.61–1.24)
GFR, Est AFR Am: >60 mL/min (ref >60)
GFR, Estimated: >60 mL/min (ref >60)
Glucose, Bld: 92 mg/dL (ref 70–99)
Potassium: 4.8 mmol/L (ref 3.5–5.1)
Sodium: 138 mmol/L (ref 135–145)
Total Bilirubin: 1.2 mg/dL (ref 0.3–1.2)
Total Protein: 6.6 g/dL (ref 6.5–8.1)

## 2020-04-26 LAB — SAMPLE TO BLOOD BANK

## 2020-04-26 MED ORDER — EPOETIN ALFA-EPBX 40000 UNIT/ML IJ SOLN
INTRAMUSCULAR | Status: AC
  Filled 2020-04-26: qty 1

## 2020-04-26 MED ORDER — EPOETIN ALFA-EPBX 40000 UNIT/ML IJ SOLN
40000.0000 [IU] | Freq: Once | INTRAMUSCULAR | Status: AC
  Administered 2020-04-26: 40000 [IU] via SUBCUTANEOUS

## 2020-04-26 NOTE — Patient Instructions (Signed)

## 2020-04-30 MED FILL — JAKAFI 10 MG TABLET: 10 | 30 days supply | Qty: 60 | Fill #1

## 2020-05-03 ENCOUNTER — Inpatient Hospital Stay: Payer: Medicare PPO

## 2020-05-03 ENCOUNTER — Other Ambulatory Visit: Payer: Self-pay

## 2020-05-03 VITALS — BP 133/44 | HR 73 | Temp 98.2°F | Resp 18

## 2020-05-03 DIAGNOSIS — D631 Anemia in chronic kidney disease: Secondary | ICD-10-CM | POA: Diagnosis not present

## 2020-05-03 DIAGNOSIS — D45 Polycythemia vera: Secondary | ICD-10-CM | POA: Diagnosis not present

## 2020-05-03 DIAGNOSIS — N189 Chronic kidney disease, unspecified: Secondary | ICD-10-CM | POA: Diagnosis not present

## 2020-05-03 LAB — CMP (CANCER CENTER ONLY)
ALT: 13 U/L (ref 0–44)
AST: 21 U/L (ref 15–41)
Albumin: 4 g/dL (ref 3.5–5.0)
Alkaline Phosphatase: 280 U/L — ABNORMAL HIGH (ref 38–126)
Anion gap: 7 (ref 5–15)
BUN: 24 mg/dL — ABNORMAL HIGH (ref 8–23)
CO2: 27 mmol/L (ref 22–32)
Calcium: 9 mg/dL (ref 8.9–10.3)
Chloride: 107 mmol/L (ref 98–111)
Creatinine: 0.85 mg/dL (ref 0.61–1.24)
GFR, Est AFR Am: >60 mL/min (ref >60)
GFR, Estimated: >60 mL/min (ref >60)
Glucose, Bld: 115 mg/dL — ABNORMAL HIGH (ref 70–99)
Potassium: 4.1 mmol/L (ref 3.5–5.1)
Sodium: 141 mmol/L (ref 135–145)
Total Bilirubin: 1.3 mg/dL — ABNORMAL HIGH (ref 0.3–1.2)
Total Protein: 6.2 g/dL — ABNORMAL LOW (ref 6.5–8.1)

## 2020-05-03 LAB — CBC WITH DIFFERENTIAL (CANCER CENTER ONLY)
Abs Immature Granulocytes: 1.3 10*3/uL — ABNORMAL HIGH (ref 0.00–0.07)
Basophils Absolute: 0.1 10*3/uL (ref 0.0–0.1)
Basophils Relative: 1 %
Blasts: 6 %
Eosinophils Absolute: 0 10*3/uL (ref 0.0–0.5)
Eosinophils Relative: 0 %
HCT: 22.7 % — ABNORMAL LOW (ref 39.0–52.0)
Hemoglobin: 6.9 g/dL — CL (ref 13.0–17.0)
Lymphocytes Relative: 11 %
Lymphs Abs: 1.2 10*3/uL (ref 0.7–4.0)
MCH: 31.4 pg (ref 26.0–34.0)
MCHC: 30.4 g/dL (ref 30.0–36.0)
MCV: 103.2 fL — ABNORMAL HIGH (ref 80.0–100.0)
Metamyelocytes Relative: 8 %
Monocytes Absolute: 2.2 10*3/uL — ABNORMAL HIGH (ref 0.1–1.0)
Monocytes Relative: 20 %
Myelocytes: 3 %
Neutro Abs: 5.5 10*3/uL (ref 1.7–7.7)
Neutrophils Relative %: 50 %
Platelet Count: 77 10*3/uL — ABNORMAL LOW (ref 150–400)
Promyelocytes Relative: 1 %
RBC: 2.2 MIL/uL — ABNORMAL LOW (ref 4.22–5.81)
RDW: 25.1 % — ABNORMAL HIGH (ref 11.5–15.5)
WBC Count: 11 10*3/uL — ABNORMAL HIGH (ref 4.0–10.5)
nRBC: 3.7 % — ABNORMAL HIGH (ref 0.0–0.2)

## 2020-05-03 LAB — SAMPLE TO BLOOD BANK

## 2020-05-03 MED ORDER — EPOETIN ALFA-EPBX 40000 UNIT/ML IJ SOLN
INTRAMUSCULAR | Status: AC
  Filled 2020-05-03: qty 1

## 2020-05-03 MED ORDER — EPOETIN ALFA-EPBX 40000 UNIT/ML IJ SOLN
40000.0000 [IU] | Freq: Once | INTRAMUSCULAR | Status: AC
  Administered 2020-05-03: 40000 [IU] via SUBCUTANEOUS

## 2020-05-03 NOTE — Patient Instructions (Signed)

## 2020-05-03 NOTE — Progress Notes (Signed)
Dr. Marin Olp notified of HGB-6.9.  Order received for pt to return next week as scheduled and to not get any blood transfusions at this time.

## 2020-05-10 ENCOUNTER — Other Ambulatory Visit: Payer: Self-pay

## 2020-05-10 ENCOUNTER — Inpatient Hospital Stay: Payer: Medicare PPO

## 2020-05-10 ENCOUNTER — Encounter: Payer: Self-pay | Admitting: Hematology & Oncology

## 2020-05-10 ENCOUNTER — Other Ambulatory Visit: Payer: Self-pay | Admitting: Family

## 2020-05-10 ENCOUNTER — Inpatient Hospital Stay (HOSPITAL_BASED_OUTPATIENT_CLINIC_OR_DEPARTMENT_OTHER): Payer: Medicare PPO | Admitting: Hematology & Oncology

## 2020-05-10 VITALS — BP 128/58 | HR 72 | Temp 97.5°F | Resp 19 | Ht 72.0 in | Wt 159.1 lb

## 2020-05-10 DIAGNOSIS — D45 Polycythemia vera: Secondary | ICD-10-CM

## 2020-05-10 DIAGNOSIS — D474 Osteomyelofibrosis: Secondary | ICD-10-CM

## 2020-05-10 DIAGNOSIS — E349 Endocrine disorder, unspecified: Secondary | ICD-10-CM

## 2020-05-10 DIAGNOSIS — N189 Chronic kidney disease, unspecified: Secondary | ICD-10-CM | POA: Diagnosis not present

## 2020-05-10 DIAGNOSIS — D649 Anemia, unspecified: Secondary | ICD-10-CM

## 2020-05-10 DIAGNOSIS — D631 Anemia in chronic kidney disease: Secondary | ICD-10-CM | POA: Diagnosis not present

## 2020-05-10 LAB — CBC WITH DIFFERENTIAL (CANCER CENTER ONLY)
Abs Immature Granulocytes: 1.6 10*3/uL — ABNORMAL HIGH (ref 0.00–0.07)
Band Neutrophils: 8 %
Basophils Absolute: 0.3 10*3/uL — ABNORMAL HIGH (ref 0.0–0.1)
Basophils Relative: 2 %
Blasts: 8 %
Eosinophils Absolute: 0 10*3/uL (ref 0.0–0.5)
Eosinophils Relative: 0 %
HCT: 20.4 % — ABNORMAL LOW (ref 39.0–52.0)
Hemoglobin: 5.9 g/dL — CL (ref 13.0–17.0)
Lymphocytes Relative: 12 %
Lymphs Abs: 1.6 10*3/uL (ref 0.7–4.0)
MCH: 30.1 pg (ref 26.0–34.0)
MCHC: 28.9 g/dL — ABNORMAL LOW (ref 30.0–36.0)
MCV: 104.1 fL — ABNORMAL HIGH (ref 80.0–100.0)
Metamyelocytes Relative: 8 %
Monocytes Absolute: 2.4 10*3/uL — ABNORMAL HIGH (ref 0.1–1.0)
Monocytes Relative: 18 %
Myelocytes: 3 %
Neutro Abs: 6.3 10*3/uL (ref 1.7–7.7)
Neutrophils Relative %: 40 %
Platelet Count: 70 10*3/uL — ABNORMAL LOW (ref 150–400)
Promyelocytes Relative: 1 %
RBC: 1.96 MIL/uL — ABNORMAL LOW (ref 4.22–5.81)
RDW: 26.2 % — ABNORMAL HIGH (ref 11.5–15.5)
WBC Count: 13.2 10*3/uL — ABNORMAL HIGH (ref 4.0–10.5)
nRBC: 3.9 % — ABNORMAL HIGH (ref 0.0–0.2)

## 2020-05-10 LAB — SAMPLE TO BLOOD BANK

## 2020-05-10 LAB — CMP (CANCER CENTER ONLY)
ALT: 12 U/L (ref 0–44)
AST: 22 U/L (ref 15–41)
Albumin: 4 g/dL (ref 3.5–5.0)
Alkaline Phosphatase: 260 U/L — ABNORMAL HIGH (ref 38–126)
Anion gap: 8 (ref 5–15)
BUN: 28 mg/dL — ABNORMAL HIGH (ref 8–23)
CO2: 26 mmol/L (ref 22–32)
Calcium: 9 mg/dL (ref 8.9–10.3)
Chloride: 110 mmol/L (ref 98–111)
Creatinine: 0.9 mg/dL (ref 0.61–1.24)
GFR, Est AFR Am: >60 mL/min (ref >60)
GFR, Estimated: >60 mL/min (ref >60)
Glucose, Bld: 98 mg/dL (ref 70–99)
Potassium: 4.9 mmol/L (ref 3.5–5.1)
Sodium: 144 mmol/L (ref 135–145)
Total Bilirubin: 1.4 mg/dL — ABNORMAL HIGH (ref 0.3–1.2)
Total Protein: 6.4 g/dL — ABNORMAL LOW (ref 6.5–8.1)

## 2020-05-10 MED ORDER — EPOETIN ALFA-EPBX 40000 UNIT/ML IJ SOLN
40000.0000 [IU] | Freq: Once | INTRAMUSCULAR | Status: AC
  Administered 2020-05-10: 40000 [IU] via SUBCUTANEOUS

## 2020-05-10 MED ORDER — EPOETIN ALFA-EPBX 40000 UNIT/ML IJ SOLN
INTRAMUSCULAR | Status: AC
  Filled 2020-05-10: qty 1

## 2020-05-10 NOTE — Patient Instructions (Signed)

## 2020-05-10 NOTE — Progress Notes (Signed)
Hematology and Oncology Follow Up Visit  Benjamin [name] [id number] 1950-10-22 70 y.o. 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 He is on [place]. He is tolerating Jakafi okay. Unfortunately, his blood counts just are not that great. His platelet count is down to 70,000. His hemoglobin is 5.9.  I would not think that the cytopenias are from his underlying disease. His spleen does feel smaller.  I am going to hold the [facility] for right now.  He has had no itching. 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  He has had no fever. He has had no obvious bleeding. There is been no change in bowel or bladder habits. He has been no hematuria.   He still has some swelling in the right ankle. This is been chronic.   His appetite is doing okay. He has had no nausea or vomiting. He has had no cough or shortness of breath. He has had no headache.  Overall, his performance status is ECOG 1.   Medications:  Allergies as of 05/10/2020      Reactions   Aleve [naproxen Sodium] Other (See Comments)   Pt thinks it might be all NSAIDs but is unsure. Caused him to have emergency stomach surgery.   Biaxin [clarithromycin] Other (See Comments)   Joint pains   Cephalexin Hives, Rash   Flagyl [metronidazole] Other (See Comments)   Joint pain   Morphine And Related Hives      Medication List       Accurate as of May 10, 2020  5:31 PM. If you have any questions, ask your nurse or doctor.        aspirin 81 MG tablet Take 81 mg by mouth every morning.    colchicine 0.6 MG tablet Take 1 tablet (0.6 mg total) by mouth daily.   hydroxyurea 500 MG capsule Commonly known as: HYDREA Take 1 capsule (500 mg total) by mouth daily. May take with food to minimize GI side effects.   Magnesium 400 MG Caps Take 1 capsule by mouth daily.   ruxolitinib phosphate 10 MG tablet Commonly known as: JAKAFI Take 1 tablet (10 mg total) by mouth 2 (two) times daily.   testosterone 4 MG/24HR Pt24 patch Commonly known as: ANDRODERM Place 1 patch onto the skin daily.   vitamin C 100 MG tablet Take 100 mg by mouth daily.   Vitamin D 50 MCG (2000 UT) Caps Take by mouth.       Allergies:  Allergies  Allergen Reactions  . Aleve [Naproxen Sodium] Other (See Comments)    Pt thinks it might be all NSAIDs but is unsure. Caused him to have emergency stomach surgery.  . Biaxin [Clarithromycin] Other (See Comments)    Joint pains  . Cephalexin Hives and Rash  . Flagyl [Metronidazole] Other (See Comments)    Joint pain  . Morphine And Related Hives    Past Medical History, Surgical history, Social history, and Family History were reviewed and updated.  Review of Systems:  Review  of Systems  Constitutional: Negative.   HENT: Negative.   Eyes: Negative.   Respiratory: Negative.   Cardiovascular: Negative.   Gastrointestinal: Negative.   Genitourinary: Negative.   Musculoskeletal: Negative.   Skin: Negative.   Neurological: Negative.   Endo/Heme/Allergies: Negative.   Psychiatric/Behavioral: Negative.     Physical Exam:  height is 6' (1.829 m) and weight is 159 lb 1.9 oz (72.2 kg). His oral temperature is 97.5 F (36.4 C) (abnormal). His blood pressure is 128/58 (abnormal) and his pulse is 72. His respiration is 19 and oxygen saturation is 100%.   Wt Readings from Last 3 Encounters:  05/10/20 159 lb 1.9 oz (72.2 kg)  04/19/20 162 lb (73.5 kg)  04/12/20 161 lb 3.2 oz (73.1 kg)    Physical Exam Vitals reviewed.  HENT:     Head:  Normocephalic and atraumatic.  Eyes:     Pupils: Pupils are equal, round, and reactive to light.  Cardiovascular:     Rate and Rhythm: Normal rate and regular rhythm.     Heart sounds: Normal heart sounds.  Pulmonary:     Effort: Pulmonary effort is normal.     Breath sounds: Normal breath sounds.  Abdominal:     General: Bowel sounds are normal.     Palpations: Abdomen is soft.     Comments: Abdominal exam shows a soft abdomen.  He has decent bowel sounds.  There is no fluid wave.  His spleen tip is down to the umbilicus.  There is no hepatomegaly.  Musculoskeletal:        General: No tenderness or deformity. Normal range of motion.     Cervical back: Normal range of motion.     Comments: He does have about 1-2+ edema in his legs bilaterally.  Lymphadenopathy:     Cervical: No cervical adenopathy.  Skin:    General: Skin is warm and dry.     Findings: No erythema or rash.  Neurological:     Mental Status: He is alert and oriented to person, place, and time.  Psychiatric:        Behavior: Behavior normal.        Thought Content: Thought content normal.        Judgment: Judgment normal.    Lab Results  Component Value Date   WBC 13.2 (H) 05/10/2020   HGB 5.9 (LL) 05/10/2020   HCT 20.4 (L) 05/10/2020   MCV 104.1 (H) 05/10/2020   PLT 70 (L) 05/10/2020   Lab Results  Component Value Date   FERRITIN 748 (H) 04/04/2020   IRON 80 04/04/2020   TIBC 239 04/04/2020   UIBC 158 04/04/2020   IRONPCTSAT 34 04/04/2020   Lab Results  Component Value Date   RETICCTPCT 7.4 (H) 03/01/2020   RBC 1.96 (L) 05/10/2020   RETICCTABS 173.6 02/27/2015   No results found for: KPAFRELGTCHN, LAMBDASER, KAPLAMBRATIO No results found for: IGGSERUM, IGA, IGMSERUM No results found for: Ronnald Ramp, A1GS, A2GS, Violet Baldy, MSPIKE, SPEI   Chemistry      Component Value Date/Time   NA 144 05/10/2020 1421   NA 141 07/19/2017 1417   NA 141 05/14/2016 1202   K 4.9  05/10/2020 1421   K 4.6 07/19/2017 1417   K 4.8 05/14/2016 1202   CL 110 05/10/2020 1421   CL 103 07/19/2017 1417   CO2 26 05/10/2020 1421   CO2 30 07/19/2017 1417   CO2 26 05/14/2016 1202   BUN 28 (H) 05/10/2020 1421  BUN 13 07/19/2017 1417   BUN 14.0 05/14/2016 1202   CREATININE 0.90 05/10/2020 1421   CREATININE 0.9 07/19/2017 1417   CREATININE 1.0 05/14/2016 1202      Component Value Date/Time   CALCIUM 9.0 05/10/2020 1421   CALCIUM 9.1 07/19/2017 1417   CALCIUM 9.2 05/14/2016 1202   ALKPHOS 260 (H) 05/10/2020 1421   ALKPHOS 870 (H) 07/19/2017 1417   ALKPHOS 913 (H) 05/14/2016 1202   AST 22 05/10/2020 1421   AST 39 (H) 05/14/2016 1202   ALT 12 05/10/2020 1421   ALT 50 (H) 07/19/2017 1417   ALT 41 05/14/2016 1202   BILITOT 1.4 (H) 05/10/2020 1421   BILITOT 1.00 05/14/2016 1202      Impression and Plan: Benjamin [name] is a very pleasant 70 yo caucasian gentleman with polycythemia vera, JAK2 positive.   His polycythemia clearly has transformed.  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  I looked at his blood smear.  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    Hopefully, we will see his platelet counts improve with the Jakafi being held.  He is going had to be transfused. He will get his Aranesp today.  We are checking his iron studies. Back in August, the iron studies were okay.  Of note, his alkaline phosphatase has been coming down. I would think this is a crude indicator that the bone marrow is certainly not as active.  We still have to follow his lab work weekly for right now.  We will plan to see him back in another 3 weeks or so.   Volanda Napoleon, MD 9/17/20215:31 PM

## 2020-05-11 LAB — TESTOSTERONE: Testosterone: 368 ng/dL (ref 264–916)

## 2020-05-13 ENCOUNTER — Other Ambulatory Visit: Payer: Self-pay

## 2020-05-13 ENCOUNTER — Inpatient Hospital Stay: Payer: Medicare PPO

## 2020-05-13 ENCOUNTER — Other Ambulatory Visit: Payer: Self-pay | Admitting: Hematology & Oncology

## 2020-05-13 DIAGNOSIS — D45 Polycythemia vera: Secondary | ICD-10-CM | POA: Diagnosis not present

## 2020-05-13 DIAGNOSIS — N189 Chronic kidney disease, unspecified: Secondary | ICD-10-CM | POA: Diagnosis not present

## 2020-05-13 DIAGNOSIS — D649 Anemia, unspecified: Secondary | ICD-10-CM

## 2020-05-13 DIAGNOSIS — D474 Osteomyelofibrosis: Secondary | ICD-10-CM

## 2020-05-13 DIAGNOSIS — D631 Anemia in chronic kidney disease: Secondary | ICD-10-CM | POA: Diagnosis not present

## 2020-05-13 LAB — LACTATE DEHYDROGENASE: LDH: 1264 U/L — ABNORMAL HIGH (ref 98–192)

## 2020-05-13 LAB — IRON AND TIBC
Iron: 196 ug/dL — ABNORMAL HIGH (ref 42–163)
Saturation Ratios: 87 % — ABNORMAL HIGH (ref 20–55)
TIBC: 226 ug/dL (ref 202–409)
UIBC: 30 ug/dL — ABNORMAL LOW (ref 117–376)

## 2020-05-13 LAB — FERRITIN: Ferritin: 799 ng/mL — ABNORMAL HIGH (ref 24–336)

## 2020-05-13 LAB — PREPARE RBC (CROSSMATCH)

## 2020-05-13 MED ORDER — DIPHENHYDRAMINE HCL 25 MG PO CAPS
25.0000 mg | ORAL_CAPSULE | Freq: Once | ORAL | Status: AC
  Administered 2020-05-13: 25 mg via ORAL

## 2020-05-13 MED ORDER — SODIUM CHLORIDE 0.9% IV SOLUTION
250.0000 mL | Freq: Once | INTRAVENOUS | Status: AC
  Administered 2020-05-13: 250 mL via INTRAVENOUS
  Filled 2020-05-13: qty 250

## 2020-05-13 MED ORDER — ACETAMINOPHEN 325 MG PO TABS
650.0000 mg | ORAL_TABLET | Freq: Once | ORAL | Status: AC
  Administered 2020-05-13: 650 mg via ORAL

## 2020-05-13 NOTE — Patient Instructions (Signed)

## 2020-05-14 LAB — TYPE AND SCREEN
ABO/RH(D): B POS
Antibody Screen: NEGATIVE
Unit division: 0
Unit division: 0

## 2020-05-14 LAB — BPAM RBC
Blood Product Expiration Date: 202110112359
Blood Product Expiration Date: 202110222359
ISSUE DATE / TIME: 202109200824
ISSUE DATE / TIME: 202109200824
Unit Type and Rh: 7300
Unit Type and Rh: 7300

## 2020-05-17 ENCOUNTER — Other Ambulatory Visit: Payer: Self-pay

## 2020-05-17 ENCOUNTER — Inpatient Hospital Stay: Payer: Medicare PPO

## 2020-05-17 ENCOUNTER — Other Ambulatory Visit: Payer: Self-pay | Admitting: *Deleted

## 2020-05-17 DIAGNOSIS — D474 Osteomyelofibrosis: Secondary | ICD-10-CM

## 2020-05-17 DIAGNOSIS — D631 Anemia in chronic kidney disease: Secondary | ICD-10-CM | POA: Diagnosis not present

## 2020-05-17 DIAGNOSIS — D45 Polycythemia vera: Secondary | ICD-10-CM

## 2020-05-17 DIAGNOSIS — D649 Anemia, unspecified: Secondary | ICD-10-CM

## 2020-05-17 DIAGNOSIS — N189 Chronic kidney disease, unspecified: Secondary | ICD-10-CM | POA: Diagnosis not present

## 2020-05-17 LAB — CBC WITH DIFFERENTIAL (CANCER CENTER ONLY)
Abs Immature Granulocytes: 2.4 10*3/uL — ABNORMAL HIGH (ref 0.00–0.07)
Band Neutrophils: 10 %
Basophils Absolute: 0 10*3/uL (ref 0.0–0.1)
Basophils Relative: 0 %
Blasts: 8 %
Eosinophils Absolute: 0 10*3/uL (ref 0.0–0.5)
Eosinophils Relative: 0 %
HCT: 21.2 % — ABNORMAL LOW (ref 39.0–52.0)
Hemoglobin: 6.5 g/dL — CL (ref 13.0–17.0)
Lymphocytes Relative: 7 %
Lymphs Abs: 1.5 10*3/uL (ref 0.7–4.0)
MCH: 29 pg (ref 26.0–34.0)
MCHC: 30.7 g/dL (ref 30.0–36.0)
MCV: 94.6 fL (ref 80.0–100.0)
Metamyelocytes Relative: 7 %
Monocytes Absolute: 0.9 10*3/uL (ref 0.1–1.0)
Monocytes Relative: 4 %
Myelocytes: 3 %
Neutro Abs: 15.1 10*3/uL — ABNORMAL HIGH (ref 1.7–7.7)
Neutrophils Relative %: 60 %
Platelet Count: 53 10*3/uL — ABNORMAL LOW (ref 150–400)
Promyelocytes Relative: 1 %
RBC: 2.24 MIL/uL — ABNORMAL LOW (ref 4.22–5.81)
RDW: 23.1 % — ABNORMAL HIGH (ref 11.5–15.5)
WBC Count: 21.6 10*3/uL — ABNORMAL HIGH (ref 4.0–10.5)
nRBC: 10.6 % — ABNORMAL HIGH (ref 0.0–0.2)

## 2020-05-17 LAB — CMP (CANCER CENTER ONLY)
ALT: 10 U/L (ref 0–44)
AST: 17 U/L (ref 15–41)
Albumin: 3.7 g/dL (ref 3.5–5.0)
Alkaline Phosphatase: 257 U/L — ABNORMAL HIGH (ref 38–126)
Anion gap: 10 (ref 5–15)
BUN: 20 mg/dL (ref 8–23)
CO2: 23 mmol/L (ref 22–32)
Calcium: 8.9 mg/dL (ref 8.9–10.3)
Chloride: 102 mmol/L (ref 98–111)
Creatinine: 0.97 mg/dL (ref 0.61–1.24)
GFR, Est AFR Am: >60 mL/min (ref >60)
GFR, Estimated: >60 mL/min (ref >60)
Glucose, Bld: 167 mg/dL — ABNORMAL HIGH (ref 70–99)
Potassium: 4.6 mmol/L (ref 3.5–5.1)
Sodium: 135 mmol/L (ref 135–145)
Total Bilirubin: 1.8 mg/dL — ABNORMAL HIGH (ref 0.3–1.2)
Total Protein: 6 g/dL — ABNORMAL LOW (ref 6.5–8.1)

## 2020-05-17 LAB — SAMPLE TO BLOOD BANK

## 2020-05-17 LAB — PREPARE RBC (CROSSMATCH)

## 2020-05-20 ENCOUNTER — Other Ambulatory Visit: Payer: Self-pay

## 2020-05-20 ENCOUNTER — Inpatient Hospital Stay: Payer: Medicare PPO

## 2020-05-20 DIAGNOSIS — D631 Anemia in chronic kidney disease: Secondary | ICD-10-CM

## 2020-05-20 DIAGNOSIS — D45 Polycythemia vera: Secondary | ICD-10-CM | POA: Diagnosis not present

## 2020-05-20 DIAGNOSIS — N189 Chronic kidney disease, unspecified: Secondary | ICD-10-CM | POA: Diagnosis not present

## 2020-05-20 DIAGNOSIS — D500 Iron deficiency anemia secondary to blood loss (chronic): Secondary | ICD-10-CM

## 2020-05-20 DIAGNOSIS — D649 Anemia, unspecified: Secondary | ICD-10-CM

## 2020-05-20 DIAGNOSIS — D474 Osteomyelofibrosis: Secondary | ICD-10-CM

## 2020-05-20 MED ORDER — SODIUM CHLORIDE 0.9% IV SOLUTION
250.0000 mL | Freq: Once | INTRAVENOUS | Status: DC
  Filled 2020-05-20: qty 250

## 2020-05-20 MED ORDER — SODIUM CHLORIDE 0.9% IV SOLUTION
250.0000 mL | Freq: Once | INTRAVENOUS | Status: AC
  Administered 2020-05-20: 250 mL via INTRAVENOUS
  Filled 2020-05-20: qty 250

## 2020-05-20 NOTE — Patient Instructions (Signed)

## 2020-05-21 LAB — TYPE AND SCREEN
ABO/RH(D): B POS
Antibody Screen: NEGATIVE
Unit division: 0

## 2020-05-21 LAB — BPAM RBC
Blood Product Expiration Date: 202110212359
ISSUE DATE / TIME: 202109270801
Unit Type and Rh: 7300

## 2020-05-24 ENCOUNTER — Inpatient Hospital Stay: Payer: Medicare PPO | Attending: Hematology & Oncology

## 2020-05-24 ENCOUNTER — Encounter: Payer: Self-pay | Admitting: *Deleted

## 2020-05-24 ENCOUNTER — Other Ambulatory Visit: Payer: Self-pay | Admitting: Hematology & Oncology

## 2020-05-24 ENCOUNTER — Other Ambulatory Visit: Payer: Self-pay

## 2020-05-24 DIAGNOSIS — D631 Anemia in chronic kidney disease: Secondary | ICD-10-CM | POA: Diagnosis not present

## 2020-05-24 DIAGNOSIS — D45 Polycythemia vera: Secondary | ICD-10-CM | POA: Diagnosis not present

## 2020-05-24 DIAGNOSIS — N189 Chronic kidney disease, unspecified: Secondary | ICD-10-CM | POA: Diagnosis not present

## 2020-05-24 DIAGNOSIS — D474 Osteomyelofibrosis: Secondary | ICD-10-CM

## 2020-05-24 LAB — CMP (CANCER CENTER ONLY)
ALT: 6 U/L (ref 0–44)
AST: 16 U/L (ref 15–41)
Albumin: 3.5 g/dL (ref 3.5–5.0)
Alkaline Phosphatase: 277 U/L — ABNORMAL HIGH (ref 38–126)
Anion gap: 6 (ref 5–15)
BUN: 17 mg/dL (ref 8–23)
CO2: 25 mmol/L (ref 22–32)
Calcium: 8.5 mg/dL — ABNORMAL LOW (ref 8.9–10.3)
Chloride: 106 mmol/L (ref 98–111)
Creatinine: 0.9 mg/dL (ref 0.61–1.24)
GFR, Est AFR Am: >60 mL/min (ref >60)
GFR, Estimated: >60 mL/min (ref >60)
Glucose, Bld: 125 mg/dL — ABNORMAL HIGH (ref 70–99)
Potassium: 4.3 mmol/L (ref 3.5–5.1)
Sodium: 137 mmol/L (ref 135–145)
Total Bilirubin: 1.7 mg/dL — ABNORMAL HIGH (ref 0.3–1.2)
Total Protein: 5.7 g/dL — ABNORMAL LOW (ref 6.5–8.1)

## 2020-05-24 LAB — CBC WITH DIFFERENTIAL (CANCER CENTER ONLY)
Abs Immature Granulocytes: 2 10*3/uL — ABNORMAL HIGH (ref 0.00–0.07)
Band Neutrophils: 5 %
Basophils Absolute: 0.2 10*3/uL — ABNORMAL HIGH (ref 0.0–0.1)
Basophils Relative: 1 %
Blasts: 9 %
Eosinophils Absolute: 0.2 10*3/uL (ref 0.0–0.5)
Eosinophils Relative: 1 %
HCT: 21 % — ABNORMAL LOW (ref 39.0–52.0)
Hemoglobin: 6.2 g/dL — CL (ref 13.0–17.0)
Lymphocytes Relative: 11 %
Lymphs Abs: 1.8 10*3/uL (ref 0.7–4.0)
MCH: 27.8 pg (ref 26.0–34.0)
MCHC: 29.5 g/dL — ABNORMAL LOW (ref 30.0–36.0)
MCV: 94.2 fL (ref 80.0–100.0)
Metamyelocytes Relative: 7 %
Monocytes Absolute: 2 10*3/uL — ABNORMAL HIGH (ref 0.1–1.0)
Monocytes Relative: 12 %
Myelocytes: 4 %
Neutro Abs: 9 10*3/uL — ABNORMAL HIGH (ref 1.7–7.7)
Neutrophils Relative %: 49 %
Platelet Count: 55 10*3/uL — ABNORMAL LOW (ref 150–400)
Promyelocytes Relative: 1 %
RBC: 2.23 MIL/uL — ABNORMAL LOW (ref 4.22–5.81)
RDW: 21.9 % — ABNORMAL HIGH (ref 11.5–15.5)
WBC Count: 16.6 10*3/uL — ABNORMAL HIGH (ref 4.0–10.5)
nRBC: 5.8 % — ABNORMAL HIGH (ref 0.0–0.2)

## 2020-05-24 LAB — SAMPLE TO BLOOD BANK

## 2020-05-24 NOTE — Progress Notes (Signed)
Richardson Landry from lab brought a panic HGB of 6.2 to my attention. Results given to MD.

## 2020-05-31 ENCOUNTER — Inpatient Hospital Stay (HOSPITAL_BASED_OUTPATIENT_CLINIC_OR_DEPARTMENT_OTHER): Payer: Medicare PPO | Admitting: Hematology & Oncology

## 2020-05-31 ENCOUNTER — Inpatient Hospital Stay: Payer: Medicare PPO

## 2020-05-31 ENCOUNTER — Other Ambulatory Visit: Payer: Self-pay | Admitting: *Deleted

## 2020-05-31 ENCOUNTER — Other Ambulatory Visit: Payer: Self-pay

## 2020-05-31 ENCOUNTER — Encounter: Payer: Self-pay | Admitting: Hematology & Oncology

## 2020-05-31 ENCOUNTER — Telehealth: Payer: Self-pay | Admitting: *Deleted

## 2020-05-31 VITALS — BP 139/50 | HR 96 | Temp 98.0°F | Resp 20 | Wt 165.0 lb

## 2020-05-31 DIAGNOSIS — D45 Polycythemia vera: Secondary | ICD-10-CM

## 2020-05-31 DIAGNOSIS — D474 Osteomyelofibrosis: Secondary | ICD-10-CM

## 2020-05-31 DIAGNOSIS — N189 Chronic kidney disease, unspecified: Secondary | ICD-10-CM | POA: Diagnosis not present

## 2020-05-31 DIAGNOSIS — D631 Anemia in chronic kidney disease: Secondary | ICD-10-CM

## 2020-05-31 DIAGNOSIS — D649 Anemia, unspecified: Secondary | ICD-10-CM

## 2020-05-31 LAB — CBC WITH DIFFERENTIAL (CANCER CENTER ONLY)
Abs Immature Granulocytes: 2.08 10*3/uL — ABNORMAL HIGH (ref 0.00–0.07)
Basophils Absolute: 0.2 10*3/uL — ABNORMAL HIGH (ref 0.0–0.1)
Basophils Relative: 1 %
Eosinophils Absolute: 0.1 10*3/uL (ref 0.0–0.5)
Eosinophils Relative: 1 %
HCT: 18.6 % — ABNORMAL LOW (ref 39.0–52.0)
Hemoglobin: 5.5 g/dL — CL (ref 13.0–17.0)
Immature Granulocytes: 14 %
Lymphocytes Relative: 12 %
Lymphs Abs: 1.8 10*3/uL (ref 0.7–4.0)
MCH: 27.8 pg (ref 26.0–34.0)
MCHC: 29.6 g/dL — ABNORMAL LOW (ref 30.0–36.0)
MCV: 93.9 fL (ref 80.0–100.0)
Monocytes Absolute: 3 10*3/uL — ABNORMAL HIGH (ref 0.1–1.0)
Monocytes Relative: 20 %
Neutro Abs: 7.7 10*3/uL (ref 1.7–7.7)
Neutrophils Relative %: 52 %
Platelet Count: 38 10*3/uL — ABNORMAL LOW (ref 150–400)
RBC: 1.98 MIL/uL — ABNORMAL LOW (ref 4.22–5.81)
RDW: 22.6 % — ABNORMAL HIGH (ref 11.5–15.5)
WBC Count: 14.8 10*3/uL — ABNORMAL HIGH (ref 4.0–10.5)
WBC Morphology: 7
nRBC: 4.9 % — ABNORMAL HIGH (ref 0.0–0.2)

## 2020-05-31 LAB — SAMPLE TO BLOOD BANK

## 2020-05-31 LAB — CMP (CANCER CENTER ONLY)
ALT: 5 U/L (ref 0–44)
AST: 14 U/L — ABNORMAL LOW (ref 15–41)
Albumin: 3.6 g/dL (ref 3.5–5.0)
Alkaline Phosphatase: 295 U/L — ABNORMAL HIGH (ref 38–126)
Anion gap: 9 (ref 5–15)
BUN: 15 mg/dL (ref 8–23)
CO2: 24 mmol/L (ref 22–32)
Calcium: 8.7 mg/dL — ABNORMAL LOW (ref 8.9–10.3)
Chloride: 105 mmol/L (ref 98–111)
Creatinine: 0.88 mg/dL (ref 0.61–1.24)
GFR, Estimated: >60 mL/min (ref >60)
Glucose, Bld: 168 mg/dL — ABNORMAL HIGH (ref 70–99)
Potassium: 4.5 mmol/L (ref 3.5–5.1)
Sodium: 138 mmol/L (ref 135–145)
Total Bilirubin: 1.5 mg/dL — ABNORMAL HIGH (ref 0.3–1.2)
Total Protein: 5.6 g/dL — ABNORMAL LOW (ref 6.5–8.1)

## 2020-05-31 LAB — PREPARE RBC (CROSSMATCH)

## 2020-05-31 MED ORDER — EPOETIN ALFA-EPBX 40000 UNIT/ML IJ SOLN
40000.0000 [IU] | Freq: Once | INTRAMUSCULAR | Status: AC
  Administered 2020-05-31: 40000 [IU] via SUBCUTANEOUS

## 2020-05-31 MED ORDER — EPOETIN ALFA-EPBX 40000 UNIT/ML IJ SOLN
INTRAMUSCULAR | Status: AC
  Filled 2020-05-31: qty 1

## 2020-05-31 NOTE — Progress Notes (Signed)
Hematology and Oncology Follow Up Visit  NTHONY [name] [id number] 04-09-1951 69 y.o. 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 is here today for follow-up.return to run into more problems with respect to his blood counts.  He has been off the [facility] for a couple weeks.  His blood counts, particular his platelet count is going down.  His platelet count is now 38,000.  Despite the Retacrit, his hemoglobin is still dropping.  I really have to believe that his bone marrow is just becoming more dysfunctional.  I do worry about the possibility of him beginning to or possibly transformed over to acute leukemia.  I really think were going to have to think about a secondary opinion.  I will have to see about making referral over to [facility] and seeing if he cannot be seen over there quickly.  He is more tired.  He says he just has no energy over the past couple days.  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  I just want him to be able to have less stamina and a good quality of life.  He has had no bleeding or bruising.  He has had some leg swelling which has been chronic.  There is been no problems with fever.  He has had no rashes.  He has had no cough.  His appetite has been doing fairly well.  Overall, I would say his performance status is ECOG 1.    Medications:  Allergies as of 05/31/2020      Reactions   Aleve [naproxen Sodium] Other (See Comments)   Pt thinks it might be all NSAIDs but is unsure. Caused him to have emergency stomach surgery.   Biaxin [clarithromycin] Other (See Comments)    Joint pains   Cephalexin Hives, Rash   Flagyl [metronidazole] Other (See Comments)   Joint pain   Morphine And Related Hives      Medication List       Accurate as of May 31, 2020  1:19 PM. If you have any questions, ask your nurse or doctor.        STOP taking these medications   aspirin 81 MG tablet Stopped by: Volanda Napoleon, MD   colchicine 0.6 MG tablet Stopped by: Volanda Napoleon, MD     TAKE these medications   cyanocobalamin 1000 MCG tablet Take 1,000 mcg by mouth daily.   hydroxyurea 500 MG capsule Commonly known as: HYDREA Take 1 capsule (500 mg total) by mouth daily. May take with food to minimize GI side effects.   Magnesium 400 MG Caps Take 1 capsule by mouth daily.   ruxolitinib phosphate 10 MG tablet Commonly known as: JAKAFI Take 1 tablet (10 mg total) by mouth 2 (two) times daily.   testosterone 4 MG/24HR Pt24 patch Commonly known as: ANDRODERM Place 1 patch onto the skin daily.   vitamin C 100 MG tablet Take 100 mg by mouth daily.   Vitamin D 50 MCG (2000 UT) Caps Take by mouth.  Allergies:  Allergies  Allergen Reactions  . Aleve [Naproxen Sodium] Other (See Comments)    Pt thinks it might be all NSAIDs but is unsure. Caused him to have emergency stomach surgery.  . Biaxin [Clarithromycin] Other (See Comments)    Joint pains  . Cephalexin Hives and Rash  . Flagyl [Metronidazole] Other (See Comments)    Joint pain  . Morphine And Related Hives    Past Medical History, Surgical history, Social history, and Family History were reviewed and updated.  Review of Systems:  Review of Systems  Constitutional: Negative.   HENT: Negative.   Eyes: Negative.   Respiratory: Negative.   Cardiovascular: Negative.   Gastrointestinal: Negative.   Genitourinary: Negative.   Musculoskeletal: Negative.   Skin: Negative.   Neurological: Negative.   Endo/Heme/Allergies: Negative.   Psychiatric/Behavioral: Negative.     Physical  Exam:  weight is 165 lb (74.8 kg). His oral temperature is 98 F (36.7 C). His blood pressure is 139/50 (abnormal) and his pulse is 96. His respiration is 20 and oxygen saturation is 100%.   Wt Readings from Last 3 Encounters:  05/31/20 165 lb (74.8 kg)  05/20/20 163 lb (73.9 kg)  05/10/20 159 lb 1.9 oz (72.2 kg)    Physical Exam Vitals reviewed.  HENT:     Head: Normocephalic and atraumatic.  Eyes:     Pupils: Pupils are equal, round, and reactive to light.  Cardiovascular:     Rate and Rhythm: Normal rate and regular rhythm.     Heart sounds: Normal heart sounds.  Pulmonary:     Effort: Pulmonary effort is normal.     Breath sounds: Normal breath sounds.  Abdominal:     General: Bowel sounds are normal.     Palpations: Abdomen is soft.     Comments: Abdominal exam shows a soft abdomen.  He has decent bowel sounds.  There is no fluid wave.  His spleen tip is down to the umbilicus.  There is no hepatomegaly.  Musculoskeletal:        General: No tenderness or deformity. Normal range of motion.     Cervical back: Normal range of motion.     Comments: He does have about 1-2+ edema in his legs bilaterally.  Lymphadenopathy:     Cervical: No cervical adenopathy.  Skin:    General: Skin is warm and dry.     Findings: No erythema or rash.  Neurological:     Mental Status: He is alert and oriented to person, place, and time.  Psychiatric:        Behavior: Behavior normal.        Thought Content: Thought content normal.        Judgment: Judgment normal.    Lab Results  Component Value Date   WBC 14.8 (H) 05/31/2020   HGB 5.5 (LL) 05/31/2020   HCT 18.6 (L) 05/31/2020   MCV 93.9 05/31/2020   PLT 38 (L) 05/31/2020   Lab Results  Component Value Date   FERRITIN 799 (H) 05/10/2020   IRON 196 (H) 05/10/2020   TIBC 226 05/10/2020   UIBC 30 (L) 05/10/2020   IRONPCTSAT 87 (H) 05/10/2020   Lab Results  Component Value Date   RETICCTPCT 7.4 (H) 03/01/2020   RBC 1.98 (L)  05/31/2020   RETICCTABS 173.6 02/27/2015   No results found for: KPAFRELGTCHN, LAMBDASER, KAPLAMBRATIO No results found for: IGGSERUM, IGA, IGMSERUM No results found for: TOTALPROTELP, ALBUMINELP, A1GS, A2GS, BETS, BETA2SER, [place], MSPIKE, SPEI  Chemistry      Component Value Date/Time   NA 138 05/31/2020 1210   NA 141 07/19/2017 1417   NA 141 05/14/2016 1202   K 4.5 05/31/2020 1210   K 4.6 07/19/2017 1417   K 4.8 05/14/2016 1202   CL 105 05/31/2020 1210   CL 103 07/19/2017 1417   CO2 24 05/31/2020 1210   CO2 30 07/19/2017 1417   CO2 26 05/14/2016 1202   BUN 15 05/31/2020 1210   BUN 13 07/19/2017 1417   BUN 14.0 05/14/2016 1202   CREATININE 0.88 05/31/2020 1210   CREATININE 0.9 07/19/2017 1417   CREATININE 1.0 05/14/2016 1202      Component Value Date/Time   CALCIUM 8.7 (L) 05/31/2020 1210   CALCIUM 9.1 07/19/2017 1417   CALCIUM 9.2 05/14/2016 1202   ALKPHOS 295 (H) 05/31/2020 1210   ALKPHOS 870 (H) 07/19/2017 1417   ALKPHOS 913 (H) 05/14/2016 1202   AST 14 (L) 05/31/2020 1210   AST 39 (H) 05/14/2016 1202   ALT 5 05/31/2020 1210   ALT 50 (H) 07/19/2017 1417   ALT 41 05/14/2016 1202   BILITOT 1.5 (H) 05/31/2020 1210   BILITOT 1.00 05/14/2016 1202      Impression and Plan: Mr. Brunty is a very pleasant 69 yo caucasian gentleman with polycythemia vera, JAK2 positive.   We will have to transfuse him.  I will give him 2 units of blood.  We will do this on October 9.  He got his Retacrit today.  We will have to see if he cannot get to [facility] next week.  I will see if Dr. Jerrye Noble can see him.  It would not surprise me if he needed another bone marrow biopsy.  I just want to try to help Mr. Mcadams.  We have known him for almost 20 years.  His disease is always been very stable.  Now, he is beginning to run into problems.  I know this is incredibly complicated.  I talked to him about making a referral.  He is okay with doing this.  We will still have  to try to do weekly labs.  I am sure that Dr. Florene Glen and his colleagues at [facility] will be able to help [place] out.     Volanda Napoleon, MD 10/8/20211:19 PM

## 2020-05-31 NOTE — Patient Instructions (Signed)

## 2020-05-31 NOTE — Telephone Encounter (Signed)
Dr. Marin Olp notified of HGB-5.5. Order received for pt to get two units of PRBC's tomorrow at [facility].  Hilario Quarry RN spoke with Laurence Compton RN to add pt tomorrow at [facility] for 2 units.  Dr. Marin Olp aware.

## 2020-06-01 ENCOUNTER — Inpatient Hospital Stay: Payer: Medicare PPO

## 2020-06-01 DIAGNOSIS — D474 Osteomyelofibrosis: Secondary | ICD-10-CM

## 2020-06-01 DIAGNOSIS — D631 Anemia in chronic kidney disease: Secondary | ICD-10-CM | POA: Diagnosis not present

## 2020-06-01 DIAGNOSIS — D45 Polycythemia vera: Secondary | ICD-10-CM | POA: Diagnosis not present

## 2020-06-01 DIAGNOSIS — N189 Chronic kidney disease, unspecified: Secondary | ICD-10-CM | POA: Diagnosis not present

## 2020-06-01 DIAGNOSIS — D649 Anemia, unspecified: Secondary | ICD-10-CM

## 2020-06-01 LAB — PREPARE RBC (CROSSMATCH)

## 2020-06-01 MED ORDER — SODIUM CHLORIDE 0.9% IV SOLUTION
250.0000 mL | Freq: Once | INTRAVENOUS | Status: AC
  Administered 2020-06-01: 250 mL via INTRAVENOUS
  Filled 2020-06-01: qty 250

## 2020-06-01 MED ORDER — SODIUM CHLORIDE 0.9% FLUSH
3.0000 mL | INTRAVENOUS | Status: DC | PRN
  Filled 2020-06-01: qty 10

## 2020-06-01 NOTE — Patient Instructions (Signed)

## 2020-06-02 LAB — BPAM RBC
Blood Product Expiration Date: 202111042359
Blood Product Expiration Date: 202111042359
ISSUE DATE / TIME: 202110090840
ISSUE DATE / TIME: 202110090840
Unit Type and Rh: 7300
Unit Type and Rh: 7300

## 2020-06-02 LAB — TYPE AND SCREEN
ABO/RH(D): B POS
Antibody Screen: NEGATIVE
Unit division: 0
Unit division: 0

## 2020-06-03 ENCOUNTER — Telehealth: Payer: Self-pay | Admitting: Hematology & Oncology

## 2020-06-03 NOTE — Telephone Encounter (Signed)
I called and LMVM for patient regarding appointments added   Per 10/8 los

## 2020-06-04 ENCOUNTER — Encounter: Payer: Self-pay | Admitting: *Deleted

## 2020-06-04 NOTE — Progress Notes (Signed)
This nurse made a referral to Dr. Jerrye Noble. Fax number is [phone]. received faxed confirmation.

## 2020-06-07 ENCOUNTER — Inpatient Hospital Stay: Payer: Medicare PPO

## 2020-06-07 ENCOUNTER — Other Ambulatory Visit: Payer: Self-pay

## 2020-06-07 VITALS — BP 142/54 | HR 81 | Temp 98.0°F | Resp 17

## 2020-06-07 DIAGNOSIS — N189 Chronic kidney disease, unspecified: Secondary | ICD-10-CM | POA: Diagnosis not present

## 2020-06-07 DIAGNOSIS — D45 Polycythemia vera: Secondary | ICD-10-CM | POA: Diagnosis not present

## 2020-06-07 DIAGNOSIS — D474 Osteomyelofibrosis: Secondary | ICD-10-CM

## 2020-06-07 DIAGNOSIS — D471 Chronic myeloproliferative disease: Secondary | ICD-10-CM | POA: Diagnosis not present

## 2020-06-07 DIAGNOSIS — D631 Anemia in chronic kidney disease: Secondary | ICD-10-CM | POA: Diagnosis not present

## 2020-06-07 LAB — CBC WITH DIFFERENTIAL (CANCER CENTER ONLY)
Abs Immature Granulocytes: 1.94 10*3/uL — ABNORMAL HIGH (ref 0.00–0.07)
Basophils Absolute: 0.1 10*3/uL (ref 0.0–0.1)
Basophils Relative: 1 %
Eosinophils Absolute: 0.1 10*3/uL (ref 0.0–0.5)
Eosinophils Relative: 1 %
HCT: 26.7 % — ABNORMAL LOW (ref 39.0–52.0)
Hemoglobin: 7.7 g/dL — ABNORMAL LOW (ref 13.0–17.0)
Immature Granulocytes: 15 %
Lymphocytes Relative: 11 %
Lymphs Abs: 1.4 10*3/uL (ref 0.7–4.0)
MCH: 28.5 pg (ref 26.0–34.0)
MCHC: 28.8 g/dL — ABNORMAL LOW (ref 30.0–36.0)
MCV: 98.9 fL (ref 80.0–100.0)
Monocytes Absolute: 2.2 10*3/uL — ABNORMAL HIGH (ref 0.1–1.0)
Monocytes Relative: 17 %
Neutro Abs: 7 10*3/uL (ref 1.7–7.7)
Neutrophils Relative %: 55 %
Platelet Count: 34 10*3/uL — ABNORMAL LOW (ref 150–400)
RBC: 2.7 MIL/uL — ABNORMAL LOW (ref 4.22–5.81)
RDW: 24.9 % — ABNORMAL HIGH (ref 11.5–15.5)
WBC Count: 12.7 10*3/uL — ABNORMAL HIGH (ref 4.0–10.5)
nRBC: 1.6 % — ABNORMAL HIGH (ref 0.0–0.2)

## 2020-06-07 LAB — CMP (CANCER CENTER ONLY)
ALT: 6 U/L (ref 0–44)
AST: 15 U/L (ref 15–41)
Albumin: 3.5 g/dL (ref 3.5–5.0)
Alkaline Phosphatase: 316 U/L — ABNORMAL HIGH (ref 38–126)
Anion gap: 7 (ref 5–15)
BUN: 17 mg/dL (ref 8–23)
CO2: 26 mmol/L (ref 22–32)
Calcium: 8.5 mg/dL — ABNORMAL LOW (ref 8.9–10.3)
Chloride: 109 mmol/L (ref 98–111)
Creatinine: 0.85 mg/dL (ref 0.61–1.24)
GFR, Estimated: >60 mL/min (ref >60)
Glucose, Bld: 143 mg/dL — ABNORMAL HIGH (ref 70–99)
Potassium: 4.7 mmol/L (ref 3.5–5.1)
Sodium: 142 mmol/L (ref 135–145)
Total Bilirubin: 1.5 mg/dL — ABNORMAL HIGH (ref 0.3–1.2)
Total Protein: 5.4 g/dL — ABNORMAL LOW (ref 6.5–8.1)

## 2020-06-07 MED ORDER — EPOETIN ALFA-EPBX 40000 UNIT/ML IJ SOLN
40000.0000 [IU] | Freq: Once | INTRAMUSCULAR | Status: AC
  Administered 2020-06-07: 40000 [IU] via SUBCUTANEOUS

## 2020-06-07 MED ORDER — EPOETIN ALFA-EPBX 40000 UNIT/ML IJ SOLN
INTRAMUSCULAR | Status: AC
  Filled 2020-06-07: qty 1

## 2020-06-07 NOTE — Patient Instructions (Signed)

## 2020-06-07 NOTE — Progress Notes (Signed)
Pt discharged in no apparent distress. Pt left ambulatory without assistance. Pt aware of discharge instructions and verbalized understanding and had no further questions.  

## 2020-06-10 DIAGNOSIS — Z23 Encounter for immunization: Secondary | ICD-10-CM | POA: Diagnosis not present

## 2020-06-10 DIAGNOSIS — D7581 Myelofibrosis: Secondary | ICD-10-CM | POA: Diagnosis not present

## 2020-06-10 DIAGNOSIS — D696 Thrombocytopenia, unspecified: Secondary | ICD-10-CM | POA: Diagnosis not present

## 2020-06-10 DIAGNOSIS — D649 Anemia, unspecified: Secondary | ICD-10-CM | POA: Diagnosis not present

## 2020-06-10 DIAGNOSIS — D45 Polycythemia vera: Secondary | ICD-10-CM | POA: Diagnosis not present

## 2020-06-11 DIAGNOSIS — D649 Anemia, unspecified: Secondary | ICD-10-CM | POA: Diagnosis not present

## 2020-06-11 DIAGNOSIS — D45 Polycythemia vera: Secondary | ICD-10-CM | POA: Diagnosis not present

## 2020-06-11 DIAGNOSIS — D696 Thrombocytopenia, unspecified: Secondary | ICD-10-CM | POA: Diagnosis not present

## 2020-06-14 ENCOUNTER — Other Ambulatory Visit: Payer: Self-pay

## 2020-06-14 ENCOUNTER — Other Ambulatory Visit: Payer: Self-pay | Admitting: Family

## 2020-06-14 ENCOUNTER — Inpatient Hospital Stay: Payer: Medicare PPO

## 2020-06-14 ENCOUNTER — Telehealth: Payer: Self-pay

## 2020-06-14 VITALS — BP 142/47 | HR 77 | Temp 98.0°F | Resp 17

## 2020-06-14 DIAGNOSIS — D631 Anemia in chronic kidney disease: Secondary | ICD-10-CM | POA: Diagnosis not present

## 2020-06-14 DIAGNOSIS — D474 Osteomyelofibrosis: Secondary | ICD-10-CM

## 2020-06-14 DIAGNOSIS — N189 Chronic kidney disease, unspecified: Secondary | ICD-10-CM | POA: Diagnosis not present

## 2020-06-14 DIAGNOSIS — D45 Polycythemia vera: Secondary | ICD-10-CM

## 2020-06-14 DIAGNOSIS — D649 Anemia, unspecified: Secondary | ICD-10-CM

## 2020-06-14 LAB — CBC WITH DIFFERENTIAL (CANCER CENTER ONLY)
Abs Immature Granulocytes: 1.98 10*3/uL — ABNORMAL HIGH (ref 0.00–0.07)
Basophils Absolute: 0.1 10*3/uL (ref 0.0–0.1)
Basophils Relative: 1 %
Eosinophils Absolute: 0.1 10*3/uL (ref 0.0–0.5)
Eosinophils Relative: 1 %
HCT: 23.6 % — ABNORMAL LOW (ref 39.0–52.0)
Hemoglobin: 6.7 g/dL — CL (ref 13.0–17.0)
Immature Granulocytes: 15 %
Lymphocytes Relative: 12 %
Lymphs Abs: 1.7 10*3/uL (ref 0.7–4.0)
MCH: 28.8 pg (ref 26.0–34.0)
MCHC: 28.4 g/dL — ABNORMAL LOW (ref 30.0–36.0)
MCV: 101.3 fL — ABNORMAL HIGH (ref 80.0–100.0)
Monocytes Absolute: 2.5 10*3/uL — ABNORMAL HIGH (ref 0.1–1.0)
Monocytes Relative: 18 %
Neutro Abs: 7.3 10*3/uL (ref 1.7–7.7)
Neutrophils Relative %: 53 %
Platelet Count: 40 10*3/uL — ABNORMAL LOW (ref 150–400)
RBC: 2.33 MIL/uL — ABNORMAL LOW (ref 4.22–5.81)
RDW: 27.6 % — ABNORMAL HIGH (ref 11.5–15.5)
WBC Count: 13.6 10*3/uL — ABNORMAL HIGH (ref 4.0–10.5)
WBC Morphology: 9
nRBC: 5.1 % — ABNORMAL HIGH (ref 0.0–0.2)

## 2020-06-14 LAB — CMP (CANCER CENTER ONLY)
ALT: 6 U/L (ref 0–44)
AST: 22 U/L (ref 15–41)
Albumin: 3.5 g/dL (ref 3.5–5.0)
Alkaline Phosphatase: 287 U/L — ABNORMAL HIGH (ref 38–126)
Anion gap: 8 (ref 5–15)
BUN: 18 mg/dL (ref 8–23)
CO2: 26 mmol/L (ref 22–32)
Calcium: 8.4 mg/dL — ABNORMAL LOW (ref 8.9–10.3)
Chloride: 106 mmol/L (ref 98–111)
Creatinine: 0.9 mg/dL (ref 0.61–1.24)
GFR, Estimated: >60 mL/min (ref >60)
Glucose, Bld: 162 mg/dL — ABNORMAL HIGH (ref 70–99)
Potassium: 4.4 mmol/L (ref 3.5–5.1)
Sodium: 140 mmol/L (ref 135–145)
Total Bilirubin: 2.2 mg/dL — ABNORMAL HIGH (ref 0.3–1.2)
Total Protein: 5.7 g/dL — ABNORMAL LOW (ref 6.5–8.1)

## 2020-06-14 LAB — PREPARE RBC (CROSSMATCH)

## 2020-06-14 MED ORDER — EPOETIN ALFA-EPBX 40000 UNIT/ML IJ SOLN
40000.0000 [IU] | Freq: Once | INTRAMUSCULAR | Status: AC
  Administered 2020-06-14: 40000 [IU] via SUBCUTANEOUS

## 2020-06-14 MED ORDER — EPOETIN ALFA-EPBX 40000 UNIT/ML IJ SOLN
INTRAMUSCULAR | Status: AC
  Filled 2020-06-14: qty 1

## 2020-06-14 NOTE — Progress Notes (Signed)
Pt discharged in no apparent distress. Pt left ambulatory without assistance. Reviewed signs and symptoms of thrombocytopenia and anemia. Pt aware of discharge instructions and verbalized understanding and had no further questions.

## 2020-06-14 NOTE — Patient Instructions (Signed)

## 2020-06-14 NOTE — Telephone Encounter (Signed)
Judson Roch, NP notified of critical low hgb. Pt to receive 2 units PRBC on Monday, October 25th. dph

## 2020-06-15 LAB — SAMPLE TO BLOOD BANK

## 2020-06-17 ENCOUNTER — Other Ambulatory Visit: Payer: Self-pay

## 2020-06-17 ENCOUNTER — Inpatient Hospital Stay: Payer: Medicare PPO

## 2020-06-17 DIAGNOSIS — N189 Chronic kidney disease, unspecified: Secondary | ICD-10-CM | POA: Diagnosis not present

## 2020-06-17 DIAGNOSIS — D474 Osteomyelofibrosis: Secondary | ICD-10-CM

## 2020-06-17 DIAGNOSIS — D45 Polycythemia vera: Secondary | ICD-10-CM | POA: Diagnosis not present

## 2020-06-17 DIAGNOSIS — D649 Anemia, unspecified: Secondary | ICD-10-CM

## 2020-06-17 DIAGNOSIS — D631 Anemia in chronic kidney disease: Secondary | ICD-10-CM | POA: Diagnosis not present

## 2020-06-17 MED ORDER — ACETAMINOPHEN 325 MG PO TABS
650.0000 mg | ORAL_TABLET | Freq: Once | ORAL | Status: AC
  Administered 2020-06-17: 650 mg via ORAL

## 2020-06-17 MED ORDER — DIPHENHYDRAMINE HCL 25 MG PO CAPS
ORAL_CAPSULE | ORAL | Status: AC
  Filled 2020-06-17: qty 1

## 2020-06-17 MED ORDER — DIPHENHYDRAMINE HCL 25 MG PO CAPS
25.0000 mg | ORAL_CAPSULE | Freq: Once | ORAL | Status: AC
  Administered 2020-06-17: 25 mg via ORAL

## 2020-06-17 MED ORDER — ACETAMINOPHEN 325 MG PO TABS
ORAL_TABLET | ORAL | Status: AC
  Filled 2020-06-17: qty 2

## 2020-06-17 MED ORDER — SODIUM CHLORIDE 0.9% IV SOLUTION
250.0000 mL | Freq: Once | INTRAVENOUS | Status: AC
  Administered 2020-06-17: 250 mL via INTRAVENOUS
  Filled 2020-06-17: qty 250

## 2020-06-17 MED ORDER — FUROSEMIDE 10 MG/ML IJ SOLN: 20.0000 mg | Freq: Once | INTRAMUSCULAR | Status: DC

## 2020-06-17 NOTE — Patient Instructions (Signed)

## 2020-06-17 NOTE — Progress Notes (Signed)
Pt discharged in no apparent distress. Pt left ambulatory without assistance. Pt aware of discharge instructions and verbalized understanding and had no further questions.  

## 2020-06-18 LAB — TYPE AND SCREEN
ABO/RH(D): B POS
Antibody Screen: NEGATIVE
Unit division: 0
Unit division: 0

## 2020-06-18 LAB — BPAM RBC
Blood Product Expiration Date: 202111112359
Blood Product Expiration Date: 202111122359
ISSUE DATE / TIME: 202110250743
ISSUE DATE / TIME: 202110250743
Unit Type and Rh: 7300
Unit Type and Rh: 7300

## 2020-06-19 LAB — SAMPLE TO BLOOD BANK

## 2020-06-21 ENCOUNTER — Inpatient Hospital Stay: Payer: Medicare PPO

## 2020-06-21 ENCOUNTER — Other Ambulatory Visit: Payer: Self-pay | Admitting: Hematology & Oncology

## 2020-06-21 ENCOUNTER — Other Ambulatory Visit: Payer: Self-pay

## 2020-06-21 VITALS — BP 128/60 | HR 77 | Temp 97.9°F | Resp 16

## 2020-06-21 DIAGNOSIS — D45 Polycythemia vera: Secondary | ICD-10-CM

## 2020-06-21 DIAGNOSIS — D474 Osteomyelofibrosis: Secondary | ICD-10-CM

## 2020-06-21 DIAGNOSIS — N189 Chronic kidney disease, unspecified: Secondary | ICD-10-CM | POA: Diagnosis not present

## 2020-06-21 DIAGNOSIS — D631 Anemia in chronic kidney disease: Secondary | ICD-10-CM | POA: Diagnosis not present

## 2020-06-21 LAB — CMP (CANCER CENTER ONLY)
ALT: 7 U/L (ref 0–44)
AST: 22 U/L (ref 15–41)
Albumin: 3.3 g/dL — ABNORMAL LOW (ref 3.5–5.0)
Alkaline Phosphatase: 308 U/L — ABNORMAL HIGH (ref 38–126)
Anion gap: 7 (ref 5–15)
BUN: 22 mg/dL (ref 8–23)
CO2: 28 mmol/L (ref 22–32)
Calcium: 8.6 mg/dL — ABNORMAL LOW (ref 8.9–10.3)
Chloride: 104 mmol/L (ref 98–111)
Creatinine: 0.87 mg/dL (ref 0.61–1.24)
GFR, Estimated: >60 mL/min (ref >60)
Glucose, Bld: 104 mg/dL — ABNORMAL HIGH (ref 70–99)
Potassium: 4 mmol/L (ref 3.5–5.1)
Sodium: 139 mmol/L (ref 135–145)
Total Bilirubin: 1.8 mg/dL — ABNORMAL HIGH (ref 0.3–1.2)
Total Protein: 5.8 g/dL — ABNORMAL LOW (ref 6.5–8.1)

## 2020-06-21 LAB — CBC WITH DIFFERENTIAL (CANCER CENTER ONLY)
Abs Immature Granulocytes: 0.6 10*3/uL — ABNORMAL HIGH (ref 0.00–0.07)
Basophils Absolute: 0.2 10*3/uL — ABNORMAL HIGH (ref 0.0–0.1)
Basophils Relative: 3 %
Blasts: 7 %
Eosinophils Absolute: 0.1 10*3/uL (ref 0.0–0.5)
Eosinophils Relative: 1 %
HCT: 26.8 % — ABNORMAL LOW (ref 39.0–52.0)
Hemoglobin: 7.8 g/dL — ABNORMAL LOW (ref 13.0–17.0)
Lymphocytes Relative: 16 %
Lymphs Abs: 1.1 10*3/uL (ref 0.7–4.0)
MCH: 29 pg (ref 26.0–34.0)
MCHC: 29.1 g/dL — ABNORMAL LOW (ref 30.0–36.0)
MCV: 99.6 fL (ref 80.0–100.0)
Metamyelocytes Relative: 5 %
Monocytes Absolute: 1.2 10*3/uL — ABNORMAL HIGH (ref 0.1–1.0)
Monocytes Relative: 17 %
Myelocytes: 3 %
Neutro Abs: 3.3 10*3/uL (ref 1.7–7.7)
Neutrophils Relative %: 48 %
Platelet Count: 39 10*3/uL — ABNORMAL LOW (ref 150–400)
RBC: 2.69 MIL/uL — ABNORMAL LOW (ref 4.22–5.81)
RDW: 25.5 % — ABNORMAL HIGH (ref 11.5–15.5)
WBC Count: 6.9 10*3/uL (ref 4.0–10.5)
nRBC: 8.1 % — ABNORMAL HIGH (ref 0.0–0.2)

## 2020-06-21 MED ORDER — EPOETIN ALFA-EPBX 40000 UNIT/ML IJ SOLN
40000.0000 [IU] | Freq: Once | INTRAMUSCULAR | Status: AC
  Administered 2020-06-21: 40000 [IU] via SUBCUTANEOUS

## 2020-06-21 NOTE — Patient Instructions (Signed)

## 2020-06-22 LAB — SAMPLE TO BLOOD BANK

## 2020-06-24 DIAGNOSIS — D45 Polycythemia vera: Secondary | ICD-10-CM | POA: Diagnosis not present

## 2020-06-25 ENCOUNTER — Encounter: Payer: Self-pay | Admitting: *Deleted

## 2020-07-01 ENCOUNTER — Inpatient Hospital Stay: Payer: Medicare PPO

## 2020-07-01 ENCOUNTER — Other Ambulatory Visit: Payer: Self-pay

## 2020-07-01 ENCOUNTER — Inpatient Hospital Stay: Payer: Medicare PPO | Attending: Hematology & Oncology

## 2020-07-01 ENCOUNTER — Encounter: Payer: Self-pay | Admitting: Hematology & Oncology

## 2020-07-01 ENCOUNTER — Inpatient Hospital Stay (HOSPITAL_BASED_OUTPATIENT_CLINIC_OR_DEPARTMENT_OTHER): Payer: Medicare PPO | Admitting: Hematology & Oncology

## 2020-07-01 VITALS — BP 150/49 | HR 87 | Temp 98.2°F | Resp 18 | Wt 162.0 lb

## 2020-07-01 DIAGNOSIS — D474 Osteomyelofibrosis: Secondary | ICD-10-CM

## 2020-07-01 DIAGNOSIS — D45 Polycythemia vera: Secondary | ICD-10-CM | POA: Insufficient documentation

## 2020-07-01 DIAGNOSIS — D631 Anemia in chronic kidney disease: Secondary | ICD-10-CM | POA: Insufficient documentation

## 2020-07-01 DIAGNOSIS — N189 Chronic kidney disease, unspecified: Secondary | ICD-10-CM | POA: Insufficient documentation

## 2020-07-01 LAB — CBC WITH DIFFERENTIAL (CANCER CENTER ONLY)
Abs Immature Granulocytes: 0.6 10*3/uL — ABNORMAL HIGH (ref 0.00–0.07)
Basophils Absolute: 0.2 10*3/uL — ABNORMAL HIGH (ref 0.0–0.1)
Basophils Relative: 2 %
Blasts: 5 %
Eosinophils Absolute: 0.1 10*3/uL (ref 0.0–0.5)
Eosinophils Relative: 1 %
HCT: 25.1 % — ABNORMAL LOW (ref 39.0–52.0)
Hemoglobin: 7.4 g/dL — ABNORMAL LOW (ref 13.0–17.0)
Lymphocytes Relative: 14 %
Lymphs Abs: 1.1 10*3/uL (ref 0.7–4.0)
MCH: 29.1 pg (ref 26.0–34.0)
MCHC: 29.5 g/dL — ABNORMAL LOW (ref 30.0–36.0)
MCV: 98.8 fL (ref 80.0–100.0)
Metamyelocytes Relative: 5 %
Monocytes Absolute: 1.8 10*3/uL — ABNORMAL HIGH (ref 0.1–1.0)
Monocytes Relative: 22 %
Myelocytes: 3 %
Neutro Abs: 3.9 10*3/uL (ref 1.7–7.7)
Neutrophils Relative %: 48 %
Platelet Count: 51 10*3/uL — ABNORMAL LOW (ref 150–400)
RBC: 2.54 MIL/uL — ABNORMAL LOW (ref 4.22–5.81)
RDW: 26.7 % — ABNORMAL HIGH (ref 11.5–15.5)
WBC Count: 8.1 10*3/uL (ref 4.0–10.5)
nRBC: 7.4 % — ABNORMAL HIGH (ref 0.0–0.2)

## 2020-07-01 LAB — RETICULOCYTES
Immature Retic Fract: 30 % — ABNORMAL HIGH (ref 2.3–15.9)
RBC.: 2.55 MIL/uL — ABNORMAL LOW (ref 4.22–5.81)
Retic Count, Absolute: 308.5 10*3/uL — ABNORMAL HIGH (ref 19.0–186.0)
Retic Ct Pct: 12.1 % — ABNORMAL HIGH (ref 0.4–3.1)

## 2020-07-01 LAB — CMP (CANCER CENTER ONLY)
ALT: 9 U/L (ref 0–44)
AST: 28 U/L (ref 15–41)
Albumin: 3.7 g/dL (ref 3.5–5.0)
Alkaline Phosphatase: 261 U/L — ABNORMAL HIGH (ref 38–126)
Anion gap: 8 (ref 5–15)
BUN: 25 mg/dL — ABNORMAL HIGH (ref 8–23)
CO2: 27 mmol/L (ref 22–32)
Calcium: 8.6 mg/dL — ABNORMAL LOW (ref 8.9–10.3)
Chloride: 103 mmol/L (ref 98–111)
Creatinine: 0.93 mg/dL (ref 0.61–1.24)
GFR, Estimated: >60 mL/min (ref >60)
Glucose, Bld: 111 mg/dL — ABNORMAL HIGH (ref 70–99)
Potassium: 4.7 mmol/L (ref 3.5–5.1)
Sodium: 138 mmol/L (ref 135–145)
Total Bilirubin: 2.6 mg/dL — ABNORMAL HIGH (ref 0.3–1.2)
Total Protein: 6.2 g/dL — ABNORMAL LOW (ref 6.5–8.1)

## 2020-07-01 LAB — SAMPLE TO BLOOD BANK

## 2020-07-01 LAB — SAVE SMEAR(SSMR), FOR PROVIDER SLIDE REVIEW

## 2020-07-01 LAB — LACTATE DEHYDROGENASE: LDH: 1580 U/L — ABNORMAL HIGH (ref 98–192)

## 2020-07-01 MED ORDER — EPOETIN ALFA-EPBX 40000 UNIT/ML IJ SOLN
40000.0000 [IU] | Freq: Once | INTRAMUSCULAR | Status: AC
  Administered 2020-07-01: 40000 [IU] via SUBCUTANEOUS

## 2020-07-01 MED ORDER — EPOETIN ALFA-EPBX 40000 UNIT/ML IJ SOLN
INTRAMUSCULAR | Status: AC
  Filled 2020-07-01: qty 1

## 2020-07-01 NOTE — Patient Instructions (Signed)
Pt discharged in no apparent distress. Pt left ambulatory without assistance. Pt aware of discharge instructions and verbalized understanding and had no further questions.  

## 2020-07-01 NOTE — Progress Notes (Signed)
Hematology and Oncology Follow Up Visit  Benjamin [name] [id number] [birth year] 69 y.o. 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  We were able to get Benjamin [name] over to [facility] for a second opinion as to how to help with his transformation of the polycythemia.  Benjamin [name] saw one of the myeloproliferative experts.  Benjamin [name] really liked her.  Reports I do not have the report as of yet.  I think she is still waiting for some molecular studies to come back.  It looks like Benjamin [name] did have a bone marrow biopsy done out there.  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  Benjamin [name] actually feels quite good.  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  Benjamin [name] has been playing couple events.  Benjamin [name] has had no problems with bleeding.  Benjamin [name] has had no problems with bowels or bladder.  His hemoglobin is about stable.  Benjamin [name] says the change has been that Benjamin [name] bought a exercise bike.  Benjamin [name] is on this every day.  Benjamin [name] is trying to get get more stamina.  Benjamin [name] feels that the exercise is helping his blood count.  His appetite is doing okay.  Benjamin [name] has had no nausea or vomiting.  Benjamin [name] has had no rashes.  Benjamin [name] does have the leg swelling which is chronic.  Overall, I would say his performance status is ECOG 1.    Medications:  Allergies as of 07/01/2020      Reactions   Aleve [naproxen Sodium] Other (See Comments)   Pt thinks it might be all NSAIDs but is unsure. Caused Benjamin [name] to have emergency stomach surgery.   Biaxin [clarithromycin] Other (See Comments)   Joint pains   Cephalexin Hives, Rash   Flagyl [metronidazole]  Other (See Comments)   Joint pain   Morphine And Related Hives      Medication List       Accurate as of July 01, 2020  5:26 PM. If you have any questions, ask your nurse or doctor.        STOP taking these medications   cyanocobalamin 1000 MCG tablet Stopped by: Volanda Napoleon, MD   hydroxyurea 500 MG capsule Commonly known as: HYDREA Stopped by: Volanda Napoleon, MD   ruxolitinib phosphate 10 MG tablet Commonly known as: JAKAFI Stopped by: Volanda Napoleon, MD   vitamin C 100 MG tablet Stopped by: Volanda Napoleon, MD     TAKE these medications   allopurinol 300 MG tablet Commonly known as: ZYLOPRIM Take 300 mg by mouth daily.   Magnesium 400 MG Caps Take 1 capsule by mouth daily.   testosterone 4 MG/24HR Pt24 patch Commonly known as: ANDRODERM Place 1 patch onto the skin daily.   Vitamin D 50 MCG (2000 UT) Caps Take by mouth.       Allergies:  Allergies  Allergen Reactions  . Aleve [Naproxen Sodium] Other (See Comments)    Pt thinks it might be all NSAIDs but is unsure. Caused Benjamin [name] to have emergency stomach surgery.  Marland Kitchen  Biaxin [Clarithromycin] Other (See Comments)    Joint pains  . Cephalexin Hives and Rash  . Flagyl [Metronidazole] Other (See Comments)    Joint pain  . Morphine And Related Hives    Past Medical History, Surgical history, Social history, and Family History were reviewed and updated.  Review of Systems:  Review of Systems  Constitutional: Negative.   HENT: Negative.   Eyes: Negative.   Respiratory: Negative.   Cardiovascular: Negative.   Gastrointestinal: Negative.   Genitourinary: Negative.   Musculoskeletal: Negative.   Skin: Negative.   Neurological: Negative.   Endo/Heme/Allergies: Negative.   Psychiatric/Behavioral: Negative.     Physical Exam:  weight is 162 lb (73.5 kg). His oral temperature is 98.2 F (36.8 C). His blood pressure is 150/49 (abnormal) and his pulse is 87. His respiration is 18 and oxygen  saturation is 100%.   Wt Readings from Last 3 Encounters:  07/01/20 162 lb (73.5 kg)  05/31/20 165 lb (74.8 kg)  05/20/20 163 lb (73.9 kg)    Physical Exam Vitals reviewed.  HENT:     Head: Normocephalic and atraumatic.  Eyes:     Pupils: Pupils are equal, round, and reactive to light.  Cardiovascular:     Rate and Rhythm: Normal rate and regular rhythm.     Heart sounds: Normal heart sounds.  Pulmonary:     Effort: Pulmonary effort is normal.     Breath sounds: Normal breath sounds.  Abdominal:     General: Bowel sounds are normal.     Palpations: Abdomen is soft.     Comments: Abdominal exam shows a soft abdomen.  Benjamin [name] has decent bowel sounds.  There is no fluid wave.  His spleen tip is down to the umbilicus.  There is no hepatomegaly.  Musculoskeletal:        General: No tenderness or deformity. Normal range of motion.     Cervical back: Normal range of motion.     Comments: Benjamin [name] does have about 1-2+ edema in his legs bilaterally.  Lymphadenopathy:     Cervical: No cervical adenopathy.  Skin:    General: Skin is warm and dry.     Findings: No erythema or rash.  Neurological:     Mental Status: Benjamin [name] is alert and oriented to person, place, and time.  Psychiatric:        Behavior: Behavior normal.        Thought Content: Thought content normal.        Judgment: Judgment normal.    Lab Results  Component Value Date   WBC 8.1 07/01/2020   HGB 7.4 (L) 07/01/2020   HCT 25.1 (L) 07/01/2020   MCV 98.8 07/01/2020   PLT 51 (L) 07/01/2020   Lab Results  Component Value Date   FERRITIN 799 (H) 05/10/2020   IRON 196 (H) 05/10/2020   TIBC 226 05/10/2020   UIBC 30 (L) 05/10/2020   IRONPCTSAT 87 (H) 05/10/2020   Lab Results  Component Value Date   RETICCTPCT 12.1 (H) 07/01/2020   RBC 2.55 (L) 07/01/2020   RETICCTABS 173.6 02/27/2015   No results found for: KPAFRELGTCHN, LAMBDASER, KAPLAMBRATIO No results found for: IGGSERUM, IGA, IGMSERUM No results found for:  Ronnald Ramp, A1GS, A2GS, Tillman Sers, SPEI   Chemistry      Component Value Date/Time   NA 138 07/01/2020 1429   NA 141 07/19/2017 1417   NA 141 05/14/2016 1202   K 4.7 07/01/2020 1429   K 4.6 07/19/2017 1417  K 4.8 05/14/2016 1202   CL 103 07/01/2020 1429   CL 103 07/19/2017 1417   CO2 27 07/01/2020 1429   CO2 30 07/19/2017 1417   CO2 26 05/14/2016 1202   BUN 25 (H) 07/01/2020 1429   BUN 13 07/19/2017 1417   BUN 14.0 05/14/2016 1202   CREATININE 0.93 07/01/2020 1429   CREATININE 0.9 07/19/2017 1417   CREATININE 1.0 05/14/2016 1202      Component Value Date/Time   CALCIUM 8.6 (L) 07/01/2020 1429   CALCIUM 9.1 07/19/2017 1417   CALCIUM 9.2 05/14/2016 1202   ALKPHOS 261 (H) 07/01/2020 1429   ALKPHOS 870 (H) 07/19/2017 1417   ALKPHOS 913 (H) 05/14/2016 1202   AST 28 07/01/2020 1429   AST 39 (H) 05/14/2016 1202   ALT 9 07/01/2020 1429   ALT 50 (H) 07/19/2017 1417   ALT 41 05/14/2016 1202   BILITOT 2.6 (H) 07/01/2020 1429   BILITOT 1.00 05/14/2016 1202      Impression and Plan: Benjamin [name] is a very pleasant 69 yo caucasian gentleman with polycythemia vera, JAK2 positive.  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  Benjamin [name] has seen the doctors at [facility].  They are awaiting some molecular studies to try to see how they might be able to treat this process.  Thankfully Benjamin [name] does not need to be transfused right now.  I am happy about this.  For right now, we will continue to have Benjamin [name] come in weekly for lab work.  Benjamin [name] likes to come in on Friday so that we can check his blood may be transfuse Benjamin [name] in the next week.  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  We will have to arrange follow-up around that.  I want to make sure that his blood is going be okay prior to Benjamin [name] playing on these events.  Benjamin [name] got his Retacrit today.  I would like to hope that this is helping Benjamin [name].  I will plan to see Benjamin [name] back myself probably right  after Thanksgiving.   Volanda Napoleon, MD 11/8/20215:26 PM

## 2020-07-02 LAB — IRON AND TIBC
Iron: 176 ug/dL — ABNORMAL HIGH (ref 42–163)
Saturation Ratios: 90 % — ABNORMAL HIGH (ref 20–55)
TIBC: 195 ug/dL — ABNORMAL LOW (ref 202–409)
UIBC: 19 ug/dL — ABNORMAL LOW (ref 117–376)

## 2020-07-02 LAB — FERRITIN: Ferritin: 1087 ng/mL — ABNORMAL HIGH (ref 24–336)

## 2020-07-11 DIAGNOSIS — C9200 Acute myeloblastic leukemia, not having achieved remission: Secondary | ICD-10-CM | POA: Diagnosis not present

## 2020-07-11 DIAGNOSIS — E883 Tumor lysis syndrome: Secondary | ICD-10-CM | POA: Diagnosis not present

## 2020-07-11 DIAGNOSIS — D471 Chronic myeloproliferative disease: Secondary | ICD-10-CM | POA: Diagnosis not present

## 2020-07-11 DIAGNOSIS — Z86718 Personal history of other venous thrombosis and embolism: Secondary | ICD-10-CM | POA: Diagnosis not present

## 2020-07-11 DIAGNOSIS — D45 Polycythemia vera: Secondary | ICD-10-CM | POA: Diagnosis not present

## 2020-07-11 DIAGNOSIS — I517 Cardiomegaly: Secondary | ICD-10-CM | POA: Diagnosis not present

## 2020-07-11 DIAGNOSIS — Z5111 Encounter for antineoplastic chemotherapy: Secondary | ICD-10-CM | POA: Diagnosis not present

## 2020-07-11 DIAGNOSIS — Z888 Allergy status to other drugs, medicaments and biological substances status: Secondary | ICD-10-CM | POA: Diagnosis not present

## 2020-07-11 DIAGNOSIS — Z885 Allergy status to narcotic agent status: Secondary | ICD-10-CM | POA: Diagnosis not present

## 2020-07-11 DIAGNOSIS — I361 Nonrheumatic tricuspid (valve) insufficiency: Secondary | ICD-10-CM | POA: Diagnosis not present

## 2020-07-11 DIAGNOSIS — Z95828 Presence of other vascular implants and grafts: Secondary | ICD-10-CM | POA: Diagnosis not present

## 2020-07-11 DIAGNOSIS — I272 Pulmonary hypertension, unspecified: Secondary | ICD-10-CM | POA: Diagnosis not present

## 2020-07-11 DIAGNOSIS — Z886 Allergy status to analgesic agent status: Secondary | ICD-10-CM | POA: Diagnosis not present

## 2020-07-11 DIAGNOSIS — R600 Localized edema: Secondary | ICD-10-CM | POA: Diagnosis not present

## 2020-07-11 DIAGNOSIS — D7581 Myelofibrosis: Secondary | ICD-10-CM | POA: Diagnosis not present

## 2020-07-11 DIAGNOSIS — Z881 Allergy status to other antibiotic agents status: Secondary | ICD-10-CM | POA: Diagnosis not present

## 2020-07-11 DIAGNOSIS — D696 Thrombocytopenia, unspecified: Secondary | ICD-10-CM | POA: Diagnosis not present

## 2020-07-12 ENCOUNTER — Inpatient Hospital Stay: Payer: Medicare PPO

## 2020-07-12 DIAGNOSIS — Z95828 Presence of other vascular implants and grafts: Secondary | ICD-10-CM | POA: Diagnosis not present

## 2020-07-12 DIAGNOSIS — Z86718 Personal history of other venous thrombosis and embolism: Secondary | ICD-10-CM | POA: Diagnosis not present

## 2020-07-12 DIAGNOSIS — E883 Tumor lysis syndrome: Secondary | ICD-10-CM | POA: Diagnosis not present

## 2020-07-12 DIAGNOSIS — D696 Thrombocytopenia, unspecified: Secondary | ICD-10-CM | POA: Diagnosis not present

## 2020-07-12 DIAGNOSIS — C9200 Acute myeloblastic leukemia, not having achieved remission: Secondary | ICD-10-CM | POA: Diagnosis not present

## 2020-07-12 DIAGNOSIS — R600 Localized edema: Secondary | ICD-10-CM | POA: Diagnosis not present

## 2020-07-12 DIAGNOSIS — D45 Polycythemia vera: Secondary | ICD-10-CM | POA: Diagnosis not present

## 2020-07-15 ENCOUNTER — Other Ambulatory Visit: Payer: Self-pay | Admitting: *Deleted

## 2020-07-15 DIAGNOSIS — D474 Osteomyelofibrosis: Secondary | ICD-10-CM

## 2020-07-16 ENCOUNTER — Telehealth: Payer: Self-pay

## 2020-07-16 NOTE — Telephone Encounter (Signed)
Called and left a VM WITH LAB APPTS ON 11/29 12/2 12/6 AND 12/9 per 07/15/20 inbasket message..... AOM

## 2020-07-19 DIAGNOSIS — C9200 Acute myeloblastic leukemia, not having achieved remission: Secondary | ICD-10-CM | POA: Diagnosis not present

## 2020-07-19 DIAGNOSIS — D45 Polycythemia vera: Secondary | ICD-10-CM | POA: Diagnosis not present

## 2020-07-22 ENCOUNTER — Other Ambulatory Visit: Payer: Self-pay | Admitting: *Deleted

## 2020-07-22 ENCOUNTER — Telehealth: Payer: Self-pay | Admitting: *Deleted

## 2020-07-22 ENCOUNTER — Inpatient Hospital Stay (HOSPITAL_BASED_OUTPATIENT_CLINIC_OR_DEPARTMENT_OTHER): Payer: Medicare PPO | Admitting: Hematology & Oncology

## 2020-07-22 ENCOUNTER — Encounter: Payer: Self-pay | Admitting: Hematology & Oncology

## 2020-07-22 ENCOUNTER — Inpatient Hospital Stay: Payer: Medicare PPO

## 2020-07-22 ENCOUNTER — Other Ambulatory Visit: Payer: Self-pay

## 2020-07-22 VITALS — BP 124/55 | HR 84 | Temp 98.3°F | Resp 17

## 2020-07-22 VITALS — BP 142/53 | HR 77 | Temp 97.8°F | Resp 17

## 2020-07-22 DIAGNOSIS — R935 Abnormal findings on diagnostic imaging of other abdominal regions, including retroperitoneum: Secondary | ICD-10-CM | POA: Diagnosis not present

## 2020-07-22 DIAGNOSIS — C9200 Acute myeloblastic leukemia, not having achieved remission: Secondary | ICD-10-CM | POA: Diagnosis not present

## 2020-07-22 DIAGNOSIS — D474 Osteomyelofibrosis: Secondary | ICD-10-CM

## 2020-07-22 DIAGNOSIS — D45 Polycythemia vera: Secondary | ICD-10-CM | POA: Diagnosis not present

## 2020-07-22 DIAGNOSIS — N189 Chronic kidney disease, unspecified: Secondary | ICD-10-CM | POA: Diagnosis not present

## 2020-07-22 DIAGNOSIS — D631 Anemia in chronic kidney disease: Secondary | ICD-10-CM | POA: Diagnosis not present

## 2020-07-22 HISTORY — DX: Acute myeloblastic leukemia, not having achieved remission: C92.00

## 2020-07-22 LAB — CMP (CANCER CENTER ONLY)
ALT: 455 U/L (ref 0–44)
AST: 373 U/L (ref 15–41)
Albumin: 3.5 g/dL (ref 3.5–5.0)
Alkaline Phosphatase: 417 U/L — ABNORMAL HIGH (ref 38–126)
Anion gap: 8 (ref 5–15)
BUN: 29 mg/dL — ABNORMAL HIGH (ref 8–23)
CO2: 23 mmol/L (ref 22–32)
Calcium: 8.4 mg/dL — ABNORMAL LOW (ref 8.9–10.3)
Chloride: 102 mmol/L (ref 98–111)
Creatinine: 0.72 mg/dL (ref 0.61–1.24)
GFR, Estimated: >60 mL/min (ref >60)
Glucose, Bld: 111 mg/dL — ABNORMAL HIGH (ref 70–99)
Potassium: 4.1 mmol/L (ref 3.5–5.1)
Sodium: 133 mmol/L — ABNORMAL LOW (ref 135–145)
Total Bilirubin: 1.6 mg/dL — ABNORMAL HIGH (ref 0.3–1.2)
Total Protein: 5.7 g/dL — ABNORMAL LOW (ref 6.5–8.1)

## 2020-07-22 LAB — CBC WITH DIFFERENTIAL (CANCER CENTER ONLY)
Abs Immature Granulocytes: 0.2 10*3/uL — ABNORMAL HIGH (ref 0.00–0.07)
Basophils Absolute: 0 10*3/uL (ref 0.0–0.1)
Basophils Relative: 0 %
Blasts: 8 %
Eosinophils Absolute: 0 10*3/uL (ref 0.0–0.5)
Eosinophils Relative: 0 %
HCT: 25.2 % — ABNORMAL LOW (ref 39.0–52.0)
Hemoglobin: 8.2 g/dL — ABNORMAL LOW (ref 13.0–17.0)
Lymphocytes Relative: 16 %
Lymphs Abs: 0.6 10*3/uL — ABNORMAL LOW (ref 0.7–4.0)
MCH: 29.5 pg (ref 26.0–34.0)
MCHC: 32.5 g/dL (ref 30.0–36.0)
MCV: 90.6 fL (ref 80.0–100.0)
Metamyelocytes Relative: 2 %
Monocytes Absolute: 0.5 10*3/uL (ref 0.1–1.0)
Monocytes Relative: 12 %
Myelocytes: 2 %
Neutro Abs: 2.3 10*3/uL (ref 1.7–7.7)
Neutrophils Relative %: 60 %
Platelet Count: 14 10*3/uL — ABNORMAL LOW (ref 150–400)
RBC: 2.78 MIL/uL — ABNORMAL LOW (ref 4.22–5.81)
RDW: 20 % — ABNORMAL HIGH (ref 11.5–15.5)
WBC Count: 3.9 10*3/uL — ABNORMAL LOW (ref 4.0–10.5)
nRBC: 1.6 % — ABNORMAL HIGH (ref 0.0–0.2)

## 2020-07-22 LAB — SAMPLE TO BLOOD BANK

## 2020-07-22 LAB — SAVE SMEAR(SSMR), FOR PROVIDER SLIDE REVIEW

## 2020-07-22 LAB — LACTATE DEHYDROGENASE: LDH: 933 U/L — ABNORMAL HIGH (ref 98–192)

## 2020-07-22 LAB — MAGNESIUM: Magnesium: 2.3 mg/dL (ref 1.7–2.4)

## 2020-07-22 LAB — PREPARE RBC (CROSSMATCH)

## 2020-07-22 MED ORDER — HEPARIN SOD (PORK) LOCK FLUSH 100 UNIT/ML IV SOLN
250.0000 [IU] | Freq: Once | INTRAVENOUS | Status: AC
  Administered 2020-07-22: 250 [IU] via INTRAVENOUS
  Filled 2020-07-22: qty 5

## 2020-07-22 MED ORDER — SODIUM CHLORIDE 0.9% FLUSH
10.0000 mL | Freq: Once | INTRAVENOUS | Status: AC
  Administered 2020-07-22: 10 mL via INTRAVENOUS
  Filled 2020-07-22: qty 10

## 2020-07-22 NOTE — Progress Notes (Signed)
Hematology and Oncology Follow Up Visit  Benjamin [name] [id number] Feb 12, 1951 69 y.o. 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  He has been treated at [facility] with Dacogen/venetoclax.  He has for cycle already.  He seemed to tolerate this pretty well.  He now comes to [place] so we can monitor his blood counts.  He tolerated treatment well.  He had some diarrhea.  I told him to try some Imodium for the diarrhea if this happens again.  He has had no problems with bleeding.  There is no fever.  There is no nausea or vomiting.  He has had chronic leg swelling.  He has had no cough.  Currently, I would have to say that his performance status is probably ECOG 1.   Medications:  Allergies as of 07/22/2020      Reactions   Aleve [naproxen Sodium] Other (See Comments)   Pt thinks it might be all NSAIDs but is unsure. Caused him to have emergency stomach surgery.   Biaxin [clarithromycin] Other (See Comments)   Joint pains   Cephalexin Hives, Rash   Flagyl [metronidazole] Other (See Comments)   Joint pain   Morphine And Related Hives      Medication List       Accurate as of July 22, 2020  5:50 PM. If you have any questions, ask your nurse or doctor.        allopurinol 300 MG tablet Commonly known as: ZYLOPRIM Take 300 mg by mouth daily.   Magnesium 400 MG Caps Take 1 capsule by mouth daily.   testosterone 4 MG/24HR Pt24 patch Commonly known as: ANDRODERM Place 1 patch onto the  skin daily.   Vitamin D 50 MCG (2000 UT) Caps Take by mouth.       Allergies:  Allergies  Allergen Reactions  . Aleve [Naproxen Sodium] Other (See Comments)    Pt thinks it might be all NSAIDs but is unsure. Caused him to have emergency stomach surgery.  . Biaxin [Clarithromycin] Other (See Comments)    Joint pains  . Cephalexin Hives and Rash  . Flagyl [Metronidazole] Other (See Comments)    Joint pain  . Morphine And Related Hives    Past Medical History, Surgical history, Social history, and Family History were reviewed and updated.  Review of Systems:  Review of Systems  Constitutional: Negative.   HENT: Negative.   Eyes: Negative.   Respiratory: Negative.   Cardiovascular: Negative.   Gastrointestinal: Negative.   Genitourinary: Negative.   Musculoskeletal: Negative.   Skin: Negative.   Neurological: Negative.   Endo/Heme/Allergies: Negative.   Psychiatric/Behavioral: Negative.     Physical Exam:  oral temperature is 97.8 F (36.6 C). His blood pressure is 142/53 (abnormal) and his pulse is 77. His respiration is 17 and oxygen saturation is 98%.   Wt Readings from Last 3 Encounters:  07/01/20 162 lb (73.5 kg)  05/31/20 165 lb (74.8 kg)  05/20/20 163 lb (73.9 kg)  Physical Exam Vitals reviewed.  HENT:     Head: Normocephalic and atraumatic.  Eyes:     Pupils: Pupils are equal, round, and reactive to light.  Cardiovascular:     Rate and Rhythm: Normal rate and regular rhythm.     Heart sounds: Normal heart sounds.  Pulmonary:     Effort: Pulmonary effort is normal.     Breath sounds: Normal breath sounds.  Abdominal:     General: Bowel sounds are normal.     Palpations: Abdomen is soft.     Comments: Abdominal exam shows a soft abdomen.  He has decent bowel sounds.  There is no fluid wave.  His spleen tip is down to the umbilicus.  There is no hepatomegaly.  Musculoskeletal:        General: No tenderness or deformity. Normal range of motion.      Cervical back: Normal range of motion.     Comments: He does have about 1-2+ edema in his legs bilaterally.  Lymphadenopathy:     Cervical: No cervical adenopathy.  Skin:    General: Skin is warm and dry.     Findings: No erythema or rash.  Neurological:     Mental Status: He is alert and oriented to person, place, and time.  Psychiatric:        Behavior: Behavior normal.        Thought Content: Thought content normal.        Judgment: Judgment normal.    Lab Results  Component Value Date   WBC 3.9 (L) 07/22/2020   HGB 8.2 (L) 07/22/2020   HCT 25.2 (L) 07/22/2020   MCV 90.6 07/22/2020   PLT 14 (L) 07/22/2020   Lab Results  Component Value Date   FERRITIN 1,087 (H) 07/01/2020   IRON 176 (H) 07/01/2020   TIBC 195 (L) 07/01/2020   UIBC 19 (L) 07/01/2020   IRONPCTSAT 90 (H) 07/01/2020   Lab Results  Component Value Date   RETICCTPCT 12.1 (H) 07/01/2020   RBC 2.78 (L) 07/22/2020   RETICCTABS 173.6 02/27/2015   No results found for: KPAFRELGTCHN, LAMBDASER, KAPLAMBRATIO No results found for: IGGSERUM, IGA, IGMSERUM No results found for: Kathrynn Ducking, MSPIKE, SPEI   Chemistry      Component Value Date/Time   NA 133 (L) 07/22/2020 1413   NA 141 07/19/2017 1417   NA 141 05/14/2016 1202   K 4.1 07/22/2020 1413   K 4.6 07/19/2017 1417   K 4.8 05/14/2016 1202   CL 102 07/22/2020 1413   CL 103 07/19/2017 1417   CO2 23 07/22/2020 1413   CO2 30 07/19/2017 1417   CO2 26 05/14/2016 1202   BUN 29 (H) 07/22/2020 1413   BUN 13 07/19/2017 1417   BUN 14.0 05/14/2016 1202   CREATININE 0.72 07/22/2020 1413   CREATININE 0.9 07/19/2017 1417   CREATININE 1.0 05/14/2016 1202      Component Value Date/Time   CALCIUM 8.4 (L) 07/22/2020 1413   CALCIUM 9.1 07/19/2017 1417   CALCIUM 9.2 05/14/2016 1202   ALKPHOS 417 (H) 07/22/2020 1413   ALKPHOS 870 (H) 07/19/2017 1417   ALKPHOS 913 (H) 05/14/2016 1202   AST 373 (HH) 07/22/2020 1413    AST 39 (H) 05/14/2016 1202   ALT 455 (HH) 07/22/2020 1413   ALT 50 (H) 07/19/2017 1417   ALT 41 05/14/2016 1202   BILITOT 1.6 (H) 07/22/2020 1413   BILITOT 1.00 05/14/2016 1202  Impression and Plan: Benjamin [name] is a very pleasant 69 yo caucasian gentleman with polycythemia vera, JAK2 positive.  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  I think he had normal cytogenetics.  He did not have any genetic markers that could use targeted therapy.  He has had his first cycle of treatment.  I noted on his labs that his liver function studies are quite high.  We are going to have to monitor this very closely.  I will know this from the venetoclax.  I told him to stop the venetoclax for right now.  I will have to get hold of his doctor at [facility] let her know.  He will need to be transfused.  [facility] very kind and gave his parameters for transfusion.  He does look quite good.  He has a diarrhea.  I am unsure if the diarrhea could be related to the elevated liver function studies.  We will monitor his blood counts twice a week.  I will plan to see him back myself next week.  We will see how his liver function studies look later on this week.     Volanda Napoleon, MD 11/29/20215:50 PM

## 2020-07-22 NOTE — Telephone Encounter (Signed)
Richardson Landry from [facility] brought me a panic lab value of AST= 373, ALT=455, and ALP= 417. Results given to MD.

## 2020-07-22 NOTE — Telephone Encounter (Signed)
Called Benjamin [name] in Dr Dickey Gave office to report all critical values on patients bloodwork done in our office.  Patient to get 1 u PRBC and 1 u Platelets tomorrow per parameters set by Dr Dickey Gave offic.e

## 2020-07-22 NOTE — Patient Instructions (Signed)
PICC Home Care Guide  A peripherally inserted central catheter (PICC) is a form of IV access that allows medicines and IV fluids to be quickly distributed throughout the body. The PICC is a long, thin, flexible tube (catheter) that is inserted into a vein in the upper arm. The catheter ends in a large vein in the chest (superior vena cava, or SVC). After the PICC is inserted, a chest X-ray may be done to make sure that it is in the correct place. A PICC may be placed for different reasons, such as:  To give medicines and liquid nutrition.  To give IV fluids and blood products.  If there is trouble placing a peripheral intravenous (PIV) catheter. If taken care of properly, a PICC can remain in place for several months. Having a PICC can also allow a person to go home from the hospital sooner. Medicine and PICC care can be managed at home by a family member, caregiver, or home health care team. What are the risks? Generally, having a PICC is safe. However, problems may occur, including:  A blood clot (thrombus) forming in or at the tip of the PICC.  A blood clot forming in a vein (deep vein thrombosis) or traveling to the lung (pulmonary embolism).  Inflammation of the vein (phlebitis) in which the PICC is placed.  Infection. Central line associated blood stream infection (CLABSI) is a serious infection that often requires hospitalization.  PICC movement (malposition). The PICC tip may move from its original position due to excessive physical activity, forceful coughing, sneezing, or vomiting.  A break or cut in the PICC. It is important not to use scissors near the PICC.  Nerve or tendon irritation or injury during PICC insertion. How to take care of your PICC Preventing problems  You and any caregivers should wash your hands often with soap. Wash hands: ? Before touching the PICC line or the infusion device. ? Before changing a bandage (dressing).  Flush the PICC as told by your  health care provider. Let your health care provider know right away if the PICC is hard to flush or does not flush. Do not use force to flush the PICC.  Do not use a syringe that is less than 10 mL to flush the PICC.  Avoid blood pressure checks on the arm in which the PICC is placed.  Never pull or tug on the PICC.  Do not take the PICC out yourself. Only a trained clinical professional should remove the PICC.  Use clean and sterile supplies only. Keep the supplies in a dry place. Do not reuse needles, syringes, or any other supplies. Doing that can lead to infection.  Keep pets and children away from your PICC line.  Check the PICC insertion site every day for signs of infection. Check for: ? Leakage. ? Redness, swelling, or pain. ? Fluid or blood. ? Warmth. ? Pus or a bad smell. PICC dressing care  Keep your PICC bandage (dressing) clean and dry to prevent infection.  Do not take baths, swim, or use a hot tub until your health care provider approves. Ask your health care provider if you can take showers. You may only be allowed to take sponge baths for bathing. When you are allowed to shower: ? Ask your health care provider to teach you how to wrap the PICC line. ? Cover the PICC line with clear plastic wrap and tape to keep it dry while showering.  Follow instructions from your health care provider   about how to take care of your insertion site and dressing. Make sure you: ? Wash your hands with soap and water before you change your bandage (dressing). If soap and water are not available, use hand sanitizer. ? Change your dressing as told by your health care provider. ? Leave stitches (sutures), skin glue, or adhesive strips in place. These skin closures may need to stay in place for 2 weeks or longer. If adhesive strip edges start to loosen and curl up, you may trim the loose edges. Do not remove adhesive strips completely unless your health care provider tells you to do  that.  Change your PICC dressing if it becomes loose or wet. General instructions   Carry your PICC identification card or wear a medical alert bracelet at all times.  Keep the tube clamped at all times, unless it is being used.  Carry a smooth-edge clamp with you at all times to place on the tube if it breaks.  Do not use scissors or sharp objects near the tube.  You may bend your arm and move it freely. If your PICC is near or at the bend of your elbow, avoid activity with repeated motion at the elbow.  Avoid lifting heavy objects as told by your health care provider.  Keep all follow-up visits as told by your health care provider. This is important. Disposal of supplies  Throw away any syringes in a disposal container that is meant for sharp items (sharps container). You can buy a sharps container from a pharmacy, or you can make one by using an empty hard plastic bottle with a cover.  Place any used dressings or infusion bags into a plastic bag. Throw that bag in the trash. Contact a health care provider if:  You have pain in your arm, ear, face, or teeth.  You have a fever or chills.  You have redness, swelling, or pain around the insertion site.  You have fluid or blood coming from the insertion site.  Your insertion site feels warm to the touch.  You have pus or a bad smell coming from the insertion site.  Your skin feels hard and raised around the insertion site. Get help right away if:  Your PICC is accidentally pulled all the way out. If this happens, cover the insertion site with a bandage or gauze dressing. Do not throw the PICC away. Your health care provider will need to check it.  Your PICC was tugged or pulled and has partially come out. Do not  push the PICC back in.  You cannot flush the PICC, it is hard to flush, or the PICC leaks around the insertion site when it is flushed.  You hear a "flushing" sound when the PICC is flushed.  You feel your  heart racing or skipping beats.  There is a hole or tear in the PICC.  You have swelling in the arm in which the PICC was inserted.  You have a red streak going up your arm from where the PICC was inserted. Summary  A peripherally inserted central catheter (PICC) is a long, thin, flexible tube (catheter) that is inserted into a vein in the upper arm.  The PICC is inserted using a sterile technique by a specially trained nurse or physician. Only a trained clinical professional should remove it.  Keep your PICC identification card with you at all times.  Avoid blood pressure checks on the arm in which the PICC is placed.  If cared for   properly, a PICC can remain in place for several months. Having a PICC can also allow a person to go home from the hospital sooner. This information is not intended to replace advice given to you by your health care provider. Make sure you discuss any questions you have with your health care provider. Document Revised: 07/23/2017 Document Reviewed: 09/12/2016 Elsevier Patient Education  2020 [employer]  

## 2020-07-23 ENCOUNTER — Telehealth: Payer: Self-pay | Admitting: *Deleted

## 2020-07-23 ENCOUNTER — Inpatient Hospital Stay: Payer: Medicare PPO | Admitting: Hematology & Oncology

## 2020-07-23 ENCOUNTER — Inpatient Hospital Stay: Payer: Medicare PPO

## 2020-07-23 ENCOUNTER — Ambulatory Visit (HOSPITAL_BASED_OUTPATIENT_CLINIC_OR_DEPARTMENT_OTHER)
Admission: RE | Admit: 2020-07-23 | Discharge: 2020-07-23 | Disposition: A | Discharge status: Home / Self Care | Payer: Medicare PPO | Source: Ambulatory Visit | Attending: Hematology & Oncology | Admitting: Hematology & Oncology

## 2020-07-23 DIAGNOSIS — D649 Anemia, unspecified: Secondary | ICD-10-CM

## 2020-07-23 DIAGNOSIS — D631 Anemia in chronic kidney disease: Secondary | ICD-10-CM | POA: Diagnosis not present

## 2020-07-23 DIAGNOSIS — K8020 Calculus of gallbladder without cholecystitis without obstruction: Secondary | ICD-10-CM | POA: Diagnosis not present

## 2020-07-23 DIAGNOSIS — R935 Abnormal findings on diagnostic imaging of other abdominal regions, including retroperitoneum: Secondary | ICD-10-CM | POA: Insufficient documentation

## 2020-07-23 DIAGNOSIS — D45 Polycythemia vera: Secondary | ICD-10-CM

## 2020-07-23 DIAGNOSIS — D474 Osteomyelofibrosis: Secondary | ICD-10-CM

## 2020-07-23 DIAGNOSIS — N189 Chronic kidney disease, unspecified: Secondary | ICD-10-CM | POA: Diagnosis not present

## 2020-07-23 LAB — CMP (CANCER CENTER ONLY)
ALT: 360 U/L (ref 0–44)
AST: 191 U/L (ref 15–41)
Albumin: 3.4 g/dL — ABNORMAL LOW (ref 3.5–5.0)
Alkaline Phosphatase: 449 U/L — ABNORMAL HIGH (ref 38–126)
Anion gap: 7 (ref 5–15)
BUN: 31 mg/dL — ABNORMAL HIGH (ref 8–23)
CO2: 24 mmol/L (ref 22–32)
Calcium: 8.4 mg/dL — ABNORMAL LOW (ref 8.9–10.3)
Chloride: 103 mmol/L (ref 98–111)
Creatinine: 0.79 mg/dL (ref 0.61–1.24)
GFR, Estimated: >60 mL/min (ref >60)
Glucose, Bld: 148 mg/dL — ABNORMAL HIGH (ref 70–99)
Potassium: 3.8 mmol/L (ref 3.5–5.1)
Sodium: 134 mmol/L — ABNORMAL LOW (ref 135–145)
Total Bilirubin: 1.3 mg/dL — ABNORMAL HIGH (ref 0.3–1.2)
Total Protein: 5.5 g/dL — ABNORMAL LOW (ref 6.5–8.1)

## 2020-07-23 MED ORDER — SODIUM CHLORIDE 0.9% IV SOLUTION
250.0000 mL | Freq: Once | INTRAVENOUS | Status: AC
  Administered 2020-07-23: 250 mL via INTRAVENOUS
  Filled 2020-07-23: qty 250

## 2020-07-23 MED ORDER — ACETAMINOPHEN 325 MG PO TABS
ORAL_TABLET | ORAL | Status: AC
  Filled 2020-07-23: qty 2

## 2020-07-23 MED ORDER — SODIUM CHLORIDE 0.9% FLUSH
3.0000 mL | INTRAVENOUS | Status: AC | PRN
  Administered 2020-07-23: 3 mL
  Filled 2020-07-23: qty 10

## 2020-07-23 MED ORDER — ACETAMINOPHEN 325 MG PO TABS
650.0000 mg | ORAL_TABLET | Freq: Once | ORAL | Status: AC
  Administered 2020-07-23: 650 mg via ORAL

## 2020-07-23 MED ORDER — DIPHENHYDRAMINE HCL 25 MG PO CAPS
25.0000 mg | ORAL_CAPSULE | Freq: Once | ORAL | Status: AC
  Administered 2020-07-23: 25 mg via ORAL

## 2020-07-23 MED ORDER — HEPARIN SOD (PORK) LOCK FLUSH 100 UNIT/ML IV SOLN
500.0000 [IU] | Freq: Every day | INTRAVENOUS | Status: AC | PRN
  Administered 2020-07-23: 500 [IU]
  Filled 2020-07-23: qty 5

## 2020-07-23 NOTE — Telephone Encounter (Signed)
Dr. Marin Olp notified of AST-191 and ALT-360.  Pt is to have US of the abdomen done today after transfusions per order of Dr. Marin Olp.  No other new orders received at this time.

## 2020-07-23 NOTE — Patient Instructions (Addendum)
Thrombocytopenia Thrombocytopenia means that you have a low number of platelets in your blood. Platelets are tiny cells in the blood. When you bleed, they clump together at the cut or injury to stop the bleeding. This is called blood clotting. If you do not have enough platelets, it can cause bleeding problems. Some cases of this condition are mild while others are more severe. What are the causes? This condition may be caused by:  Your body not making enough platelets. This may be caused by: ? Your bone marrow not making blood cells (aplastic anemia). ? Cancer in the bone marrow. ? Certain medicines. ? Infection in the bone marrow. ? Drinking a lot of alcohol.  Your body destroying platelets too quickly. This may be caused by: ? Certain immune diseases. ? Certain medicines. ? Certain blood clotting disorders. ? Certain disorders that are passed from parent to child (inherited). ? Certain bleeding disorders. ? Pregnancy. ? Having a spleen that is larger than normal. What are the signs or symptoms?  Bleeding that is not normal.  Nosebleeds.  Heavy menstrual periods.  Blood in the pee (urine) or poop (stool).  A purple-like color to the skin (purpura).  Bruising.  A rash that looks like pinpoint, purple-red spots (petechiae). How is this treated?  Treatment of another condition that is causing the low platelet count.  Medicines to help protect your platelets from being destroyed.  A replacement (transfusion) of platelets to stop or prevent bleeding.  Surgery to remove the spleen. Follow these instructions at home: Activity  Avoid activities that could cause you to get hurt or bruised. Follow instructions about how to prevent falls.  Take care not to cut yourself: ? When you shave. ? When you use scissors, needles, knives, or other tools.  Take care not to burn yourself: ? When you use an iron. ? When you cook. General instructions   Check your skin and the  inside of your mouth for bruises or blood as told by your doctor.  Check to see if there is blood in your spit (sputum), pee, and poop. Do this as told by your doctor.  Do not drink alcohol.  Take over-the-counter and prescription medicines only as told by your doctor.  Do not take any medicines that have aspirin or NSAIDs in them. These medicines can thin your blood and cause you to bleed.  Tell all of your doctors that you have this condition. Be sure to tell your dentist and eye doctor too. Contact a doctor if:  You have bruises and you do not know why. Get help right away if:  You are bleeding anywhere on your body.  You have blood in your spit, pee, or poop. Summary  Thrombocytopenia means that you have a low number of platelets in your blood.  Platelets are needed for blood clotting.  Symptoms of this condition include bleeding that is not normal, and bruising.  Take care not to cut or burn yourself. This information is not intended to replace advice given to you by your health care provider. Make sure you discuss any questions you have with your health care provider. Document Revised: 05/12/2018 Document Reviewed: 05/12/2018 Elsevier Patient Education  [place]. Anemia  Anemia is a condition in which you do not have enough red blood cells or hemoglobin. Hemoglobin is a substance in red blood cells that carries oxygen. When you do not have enough red blood cells or hemoglobin (are anemic), your body cannot get enough oxygen and  your organs may not work properly. As a result, you may feel very tired or have other problems. What are the causes? Common causes of anemia include:  Excessive bleeding. Anemia can be caused by excessive bleeding inside or outside the body, including bleeding from the intestine or from periods in women.  Poor nutrition.  Long-lasting (chronic) kidney, thyroid, and liver disease.  Bone marrow disorders.  Cancer and treatments for  cancer.  HIV (human immunodeficiency virus) and AIDS (acquired immunodeficiency syndrome).  Treatments for HIV and AIDS.  Spleen problems.  Blood disorders.  Infections, medicines, and autoimmune disorders that destroy red blood cells. What are the signs or symptoms? Symptoms of this condition include:  Minor weakness.  Dizziness.  Headache.  Feeling heartbeats that are irregular or faster than normal (palpitations).  Shortness of breath, especially with exercise.  Paleness.  Cold sensitivity.  Indigestion.  Nausea.  Difficulty sleeping.  Difficulty concentrating. Symptoms may occur suddenly or develop slowly. If your anemia is mild, you may not have symptoms. How is this diagnosed? This condition is diagnosed based on:  Blood tests.  Your medical history.  A physical exam.  Bone marrow biopsy. Your health care provider may also check your stool (feces) for blood and may do additional testing to look for the cause of your bleeding. You may also have other tests, including:  Imaging tests, such as a CT scan or MRI.  Endoscopy.  Colonoscopy. How is this treated? Treatment for this condition depends on the cause. If you continue to lose a lot of blood, you may need to be treated at a hospital. Treatment may include:  Taking supplements of iron, vitamin U23, or folic acid.  Taking a hormone medicine (erythropoietin) that can help to stimulate red blood cell growth.  Having a blood transfusion. This may be needed if you lose a lot of blood.  Making changes to your diet.  Having surgery to remove your spleen. Follow these instructions at home:  Take over-the-counter and prescription medicines only as told by your health care provider.  Take supplements only as told by your health care provider.  Follow any diet instructions that you were given.  Keep all follow-up visits as told by your health care provider. This is important. Contact a health care  provider if:  You develop new bleeding anywhere in the body. Get help right away if:  You are very weak.  You are short of breath.  You have pain in your abdomen or chest.  You are dizzy or feel faint.  You have trouble concentrating.  You have bloody or black, tarry stools.  You vomit repeatedly or you vomit up blood. Summary  Anemia is a condition in which you do not have enough red blood cells or enough of a substance in your red blood cells that carries oxygen (hemoglobin).  Symptoms may occur suddenly or develop slowly.  If your anemia is mild, you may not have symptoms.  This condition is diagnosed with blood tests as well as a medical history and physical exam. Other tests may be needed.  Treatment for this condition depends on the cause of the anemia. This information is not intended to replace advice given to you by your health care provider. Make sure you discuss any questions you have with your health care provider. Document Revised: 07/23/2017 Document Reviewed: 09/11/2016 Elsevier Patient Education  2020 [place].  Blood Transfusion, Adult, Care After This sheet gives you information about how to care for yourself after  your procedure. Your doctor may also give you more specific instructions. If you have problems or questions, contact your doctor. What can I expect after the procedure? After the procedure, it is common to have:  Bruising and soreness at the IV site.  A fever or chills on the day of the procedure. This may be your body's response to the new blood cells received.  A headache. Follow these instructions at home: Insertion site care      Follow instructions from your doctor about how to take care of your insertion site. This is where an IV tube was put into your vein. Make sure you: ? Wash your hands with soap and water before and after you change your bandage (dressing). If you cannot use soap and water, use hand sanitizer. ? Change  your bandage as told by your doctor.  Check your insertion site every day for signs of infection. Check for: ? Redness, swelling, or pain. ? Bleeding from the site. ? Warmth. ? Pus or a bad smell. General instructions  Take over-the-counter and prescription medicines only as told by your doctor.  Rest as told by your doctor.  Go back to your normal activities as told by your doctor.  Keep all follow-up visits as told by your doctor. This is important. Contact a doctor if:  You have itching or red, swollen areas of skin (hives).  You feel worried or nervous (anxious).  You feel weak after doing your normal activities.  You have redness, swelling, warmth, or pain around the insertion site.  You have blood coming from the insertion site, and the blood does not stop with pressure.  You have pus or a bad smell coming from the insertion site. Get help right away if:  You have signs of a serious reaction. This may be coming from an allergy or the body's defense system (immune system). Signs include: ? Trouble breathing or shortness of breath. ? Swelling of the face or feeling warm (flushed). ? Fever or chills. ? Head, chest, or back pain. ? Dark pee (urine) or blood in the pee. ? Widespread rash. ? Fast heartbeat. ? Feeling dizzy or light-headed. You may receive your blood transfusion in an outpatient setting. If so, you will be told whom to contact to report any reactions. These symptoms may be an emergency. Do not wait to see if the symptoms will go away. Get medical help right away. Call your local emergency services (911 in the [place] Do not drive yourself to the hospital. Summary  Bruising and soreness at the IV site are common.  Check your insertion site every day for signs of infection.  Rest as told by your doctor. Go back to your normal activities as told by your doctor.  Get help right away if you have signs of a serious reaction. This information is not intended  to replace advice given to you by your health care provider. Make sure you discuss any questions you have with your health care provider. Document Revised: 02/02/2019 Document Reviewed: 02/02/2019 Elsevier Patient Education  [place].

## 2020-07-23 NOTE — Progress Notes (Signed)
Pt. stable and asymptomatic upon discharge.

## 2020-07-23 NOTE — Addendum Note (Signed)
Addended by: Burney Gauze R on: 07/23/2020 08:59 AM   Modules accepted: Orders

## 2020-07-24 LAB — BPAM PLATELET PHERESIS
Blood Product Expiration Date: 202112012359
ISSUE DATE / TIME: 202111300736
Unit Type and Rh: 5100

## 2020-07-24 LAB — TYPE AND SCREEN
ABO/RH(D): B POS
Antibody Screen: NEGATIVE
Unit division: 0

## 2020-07-24 LAB — BPAM RBC
Blood Product Expiration Date: 202112172359
ISSUE DATE / TIME: 202111300730
Unit Type and Rh: 7300

## 2020-07-24 LAB — PREPARE PLATELET PHERESIS: Unit division: 0

## 2020-07-25 ENCOUNTER — Inpatient Hospital Stay: Payer: Medicare PPO | Attending: Hematology & Oncology

## 2020-07-25 ENCOUNTER — Other Ambulatory Visit (HOSPITAL_BASED_OUTPATIENT_CLINIC_OR_DEPARTMENT_OTHER): Payer: Medicare PPO

## 2020-07-25 ENCOUNTER — Inpatient Hospital Stay: Payer: Medicare PPO

## 2020-07-25 ENCOUNTER — Other Ambulatory Visit: Payer: Self-pay | Admitting: *Deleted

## 2020-07-25 ENCOUNTER — Other Ambulatory Visit: Payer: Self-pay

## 2020-07-25 VITALS — BP 137/57 | HR 70 | Temp 97.4°F | Resp 17

## 2020-07-25 DIAGNOSIS — D474 Osteomyelofibrosis: Secondary | ICD-10-CM

## 2020-07-25 DIAGNOSIS — B9681 Helicobacter pylori [H. pylori] as the cause of diseases classified elsewhere: Secondary | ICD-10-CM | POA: Insufficient documentation

## 2020-07-25 DIAGNOSIS — C9200 Acute myeloblastic leukemia, not having achieved remission: Secondary | ICD-10-CM | POA: Insufficient documentation

## 2020-07-25 DIAGNOSIS — D45 Polycythemia vera: Secondary | ICD-10-CM | POA: Diagnosis not present

## 2020-07-25 DIAGNOSIS — D500 Iron deficiency anemia secondary to blood loss (chronic): Secondary | ICD-10-CM

## 2020-07-25 DIAGNOSIS — Z79899 Other long term (current) drug therapy: Secondary | ICD-10-CM | POA: Diagnosis not present

## 2020-07-25 DIAGNOSIS — D649 Anemia, unspecified: Secondary | ICD-10-CM

## 2020-07-25 DIAGNOSIS — Z7982 Long term (current) use of aspirin: Secondary | ICD-10-CM | POA: Insufficient documentation

## 2020-07-25 DIAGNOSIS — K255 Chronic or unspecified gastric ulcer with perforation: Secondary | ICD-10-CM | POA: Diagnosis not present

## 2020-07-25 LAB — CMP (CANCER CENTER ONLY)
ALT: 188 U/L — ABNORMAL HIGH (ref 0–44)
AST: 48 U/L — ABNORMAL HIGH (ref 15–41)
Albumin: 3.5 g/dL (ref 3.5–5.0)
Alkaline Phosphatase: 397 U/L — ABNORMAL HIGH (ref 38–126)
Anion gap: 5 (ref 5–15)
BUN: 24 mg/dL — ABNORMAL HIGH (ref 8–23)
CO2: 26 mmol/L (ref 22–32)
Calcium: 8.7 mg/dL — ABNORMAL LOW (ref 8.9–10.3)
Chloride: 103 mmol/L (ref 98–111)
Creatinine: 0.7 mg/dL (ref 0.61–1.24)
GFR, Estimated: >60 mL/min (ref >60)
Glucose, Bld: 89 mg/dL (ref 70–99)
Potassium: 4.3 mmol/L (ref 3.5–5.1)
Sodium: 134 mmol/L — ABNORMAL LOW (ref 135–145)
Total Bilirubin: 1.6 mg/dL — ABNORMAL HIGH (ref 0.3–1.2)
Total Protein: 5.6 g/dL — ABNORMAL LOW (ref 6.5–8.1)

## 2020-07-25 LAB — CBC WITH DIFFERENTIAL (CANCER CENTER ONLY)
Band Neutrophils: 0 %
Basophils Absolute: 0 10*3/uL (ref 0.0–0.1)
Basophils Relative: 0 %
Blasts: 8 %
Eosinophils Absolute: 0 10*3/uL (ref 0.0–0.5)
Eosinophils Relative: 0 %
HCT: 24.2 % — ABNORMAL LOW (ref 39.0–52.0)
Hemoglobin: 7.7 g/dL — ABNORMAL LOW (ref 13.0–17.0)
Lymphocytes Relative: 33 %
Lymphs Abs: 0.8 10*3/uL (ref 0.7–4.0)
MCH: 28.7 pg (ref 26.0–34.0)
MCHC: 31.8 g/dL (ref 30.0–36.0)
MCV: 90.3 fL (ref 80.0–100.0)
Metamyelocytes Relative: 3 %
Monocytes Absolute: 0.2 10*3/uL (ref 0.1–1.0)
Monocytes Relative: 9 %
Myelocytes: 1 %
Neutro Abs: 1.1 10*3/uL — ABNORMAL LOW (ref 1.7–7.7)
Neutrophils Relative %: 47 %
Platelet Count: 16 10*3/uL — ABNORMAL LOW (ref 150–400)
Promyelocytes Relative: 0 %
RBC: 2.68 MIL/uL — ABNORMAL LOW (ref 4.22–5.81)
RDW: 20 % — ABNORMAL HIGH (ref 11.5–15.5)
WBC Count: 2.4 10*3/uL — ABNORMAL LOW (ref 4.0–10.5)
nRBC: 5.9 % — ABNORMAL HIGH (ref 0.0–0.2)
nRBC: 6 /100 WBC — ABNORMAL HIGH

## 2020-07-25 MED ORDER — HEPARIN SOD (PORK) LOCK FLUSH 100 UNIT/ML IV SOLN
250.0000 [IU] | Freq: Once | INTRAVENOUS | Status: AC
  Administered 2020-07-25: 250 [IU] via INTRAVENOUS
  Filled 2020-07-25: qty 5

## 2020-07-25 MED ORDER — SODIUM CHLORIDE 0.9% FLUSH
10.0000 mL | Freq: Once | INTRAVENOUS | Status: AC
  Administered 2020-07-25: 10 mL via INTRAVENOUS
  Filled 2020-07-25: qty 10

## 2020-07-25 NOTE — Progress Notes (Signed)
Per Dr. Marin Olp, patient doesn't need any platelets or blood today. Labs to be rechecked on Monday, December,6th. Patient verbalized understanding.  Pt discharged in no apparent distress. Pt left ambulatory without assistance. Pt aware of discharge instructions and verbalized understanding and had no further questions.

## 2020-07-25 NOTE — Patient Instructions (Signed)
Tunneled Central Venous Catheter Flushing Guide  It is important to flush your tunneled central venous catheter each time you use it, both before and after you use it. Flushing your catheter will help prevent it from clogging. What are the risks? Risks may include:  Infection.  Air getting into the catheter and bloodstream. Supplies needed:  A clean pair of gloves.  A disinfecting wipe. Use an alcohol wipe, chlorhexidine wipe, or iodine wipe as told by your health care provider.  A 10 mL syringe that has been prefilled with saline solution.  An empty 10 mL syringe, if a substance called heparin was injected into your catheter. How to flush your catheter When you flush your catheter, make sure you follow any specific instructions from your health care provider or the manufacturer. These are general guidelines. Flushing your catheter before use If there is heparin in your catheter: 1. Wash your hands with soap and water. 2. Put on gloves. 3. Scrub the injection cap for a minimum of 15 seconds with a disinfecting wipe. 4. Unclamp the catheter. 5. Attach the empty syringe to the injection cap. 6. Pull the syringe plunger back and withdraw 10 mL of blood. 7. Place the syringe into an appropriate waste container. 8. Scrub the injection cap for 15 seconds with a disinfecting wipe. 9. Attach the prefilled syringe to the injection cap. 10. Flush the catheter by pushing the plunger forward until all the liquid from the syringe is in the catheter. 11. Remove the syringe from the injection cap. 12. Clamp the catheter. If there is no heparin in your catheter: 1. Wash your hands with soap and water. 2. Put on gloves. 3. Scrub the injection cap for 15 seconds with a disinfecting wipe. 4. Unclamp the catheter. 5. Attach the prefilled syringe to the injection cap. 6. Flush the catheter by pushing the plunger forward until 5 mL of the liquid from the syringe is in the catheter. 7. Pull back on  the syringe until you see blood in the catheter. 8. If you have been asked to collect any blood, follow your health care provider's instructions. Otherwise, flush the catheter with the rest of the solution from the syringe. 9. Remove the syringe from the injection cap. 10. Clamp the catheter.  Flushing your catheter after use 1. Wash your hands with soap and water. 2. Put on gloves. 3. Scrub the injection cap for 15 seconds with a disinfecting wipe. 4. Unclamp the catheter. 5. Attach the prefilled syringe to the injection cap. 6. Flush the catheter by pushing the plunger forward until all of the liquid from the syringe is in the catheter. 7. Remove the syringe from the injection cap. 8. Clamp the catheter. Problems and solutions  If blood cannot be completely cleared from the injection cap, you may need to have the injection cap replaced.  If the catheter is difficult to flush, use the pulsing method. The pulsing method involves pushing only a few milliliters of solution into the catheter at a time and pausing between pushes.  If you do not see blood in the catheter when you pull back on the syringe, change your body position, such as by raising your arms above your head. Take a deep breath and cough. Then, pull back on the syringe. If you still do not see blood, flush the catheter with a small amount of solution. Then, change positions again and take a breath or cough. Pull back on the syringe again. If you still do not see   blood, finish flushing the catheter and contact your health care provider. Do not use your catheter until your health care provider says it is okay. General tips  Have someone help you flush your catheter, if possible.  Do not force fluid through your catheter.  Do not use a syringe that is larger or smaller than 10 mL. Using a smaller syringe can make the catheter burst.  Do not use your catheter without flushing it first if it has heparin in it. Contact a health  care provider if:  You cannot see any blood in the catheter when you flush it before using it.  Your catheter is difficult to flush. Get help right away if:  You cannot flush the catheter.  The catheter leaks when you flush it or when there is fluid in it.  There are cracks or breaks in the catheter. Summary  It is important to flush your tunneled central venous catheter each time you use it, both before and after you use it.  Scrub the injection cap for 15 seconds with a disinfecting wipe before and after you flush it.  When you flush your catheter, make sure you follow any specific instructions from your health care provider or the manufacturer.  Get help right away if you cannot flush the catheter. This information is not intended to replace advice given to you by your health care provider. Make sure you discuss any questions you have with your health care provider. Document Revised: 05/05/2019 Document Reviewed: 10/26/2018 Elsevier Patient Education  2020 Elsevier Inc.  

## 2020-07-26 ENCOUNTER — Other Ambulatory Visit: Payer: Self-pay | Admitting: Hematology & Oncology

## 2020-07-26 DIAGNOSIS — D474 Osteomyelofibrosis: Secondary | ICD-10-CM

## 2020-07-29 ENCOUNTER — Inpatient Hospital Stay: Payer: Medicare PPO

## 2020-07-29 ENCOUNTER — Other Ambulatory Visit: Payer: Self-pay

## 2020-07-29 ENCOUNTER — Other Ambulatory Visit: Payer: Self-pay | Admitting: *Deleted

## 2020-07-29 VITALS — BP 124/59 | HR 78 | Temp 97.8°F | Resp 18

## 2020-07-29 DIAGNOSIS — D45 Polycythemia vera: Secondary | ICD-10-CM | POA: Diagnosis not present

## 2020-07-29 DIAGNOSIS — Z95828 Presence of other vascular implants and grafts: Secondary | ICD-10-CM

## 2020-07-29 DIAGNOSIS — B9681 Helicobacter pylori [H. pylori] as the cause of diseases classified elsewhere: Secondary | ICD-10-CM | POA: Diagnosis not present

## 2020-07-29 DIAGNOSIS — C9200 Acute myeloblastic leukemia, not having achieved remission: Secondary | ICD-10-CM | POA: Diagnosis not present

## 2020-07-29 DIAGNOSIS — Z79899 Other long term (current) drug therapy: Secondary | ICD-10-CM | POA: Diagnosis not present

## 2020-07-29 DIAGNOSIS — D649 Anemia, unspecified: Secondary | ICD-10-CM

## 2020-07-29 DIAGNOSIS — K255 Chronic or unspecified gastric ulcer with perforation: Secondary | ICD-10-CM | POA: Diagnosis not present

## 2020-07-29 DIAGNOSIS — Z7982 Long term (current) use of aspirin: Secondary | ICD-10-CM | POA: Diagnosis not present

## 2020-07-29 DIAGNOSIS — D474 Osteomyelofibrosis: Secondary | ICD-10-CM

## 2020-07-29 LAB — CBC WITH DIFFERENTIAL (CANCER CENTER ONLY)
Abs Immature Granulocytes: 0.15 10*3/uL — ABNORMAL HIGH (ref 0.00–0.07)
Basophils Absolute: 0 10*3/uL (ref 0.0–0.1)
Basophils Relative: 0 %
Eosinophils Absolute: 0 10*3/uL (ref 0.0–0.5)
Eosinophils Relative: 0 %
HCT: 22 % — ABNORMAL LOW (ref 39.0–52.0)
Hemoglobin: 6.9 g/dL — CL (ref 13.0–17.0)
Immature Granulocytes: 6 %
Lymphocytes Relative: 20 %
Lymphs Abs: 0.5 10*3/uL — ABNORMAL LOW (ref 0.7–4.0)
MCH: 28.3 pg (ref 26.0–34.0)
MCHC: 31.4 g/dL (ref 30.0–36.0)
MCV: 90.2 fL (ref 80.0–100.0)
Monocytes Absolute: 0.5 10*3/uL (ref 0.1–1.0)
Monocytes Relative: 23 %
Neutro Abs: 1.2 10*3/uL — ABNORMAL LOW (ref 1.7–7.7)
Neutrophils Relative %: 51 %
Platelet Count: 32 10*3/uL — ABNORMAL LOW (ref 150–400)
RBC: 2.44 MIL/uL — ABNORMAL LOW (ref 4.22–5.81)
RDW: 21.2 % — ABNORMAL HIGH (ref 11.5–15.5)
WBC Count: 2.3 10*3/uL — ABNORMAL LOW (ref 4.0–10.5)
nRBC: 7.3 % — ABNORMAL HIGH (ref 0.0–0.2)

## 2020-07-29 LAB — SAMPLE TO BLOOD BANK

## 2020-07-29 LAB — CMP (CANCER CENTER ONLY)
ALT: 57 U/L — ABNORMAL HIGH (ref 0–44)
AST: 16 U/L (ref 15–41)
Albumin: 3.5 g/dL (ref 3.5–5.0)
Alkaline Phosphatase: 328 U/L — ABNORMAL HIGH (ref 38–126)
Anion gap: 6 (ref 5–15)
BUN: 24 mg/dL — ABNORMAL HIGH (ref 8–23)
CO2: 25 mmol/L (ref 22–32)
Calcium: 8.7 mg/dL — ABNORMAL LOW (ref 8.9–10.3)
Chloride: 99 mmol/L (ref 98–111)
Creatinine: 0.73 mg/dL (ref 0.61–1.24)
GFR, Estimated: >60 mL/min (ref >60)
Glucose, Bld: 120 mg/dL — ABNORMAL HIGH (ref 70–99)
Potassium: 4 mmol/L (ref 3.5–5.1)
Sodium: 130 mmol/L — ABNORMAL LOW (ref 135–145)
Total Bilirubin: 1.9 mg/dL — ABNORMAL HIGH (ref 0.3–1.2)
Total Protein: 5.6 g/dL — ABNORMAL LOW (ref 6.5–8.1)

## 2020-07-29 LAB — PREPARE RBC (CROSSMATCH)

## 2020-07-29 MED ORDER — SODIUM CHLORIDE 0.9% FLUSH
10.0000 mL | Freq: Once | INTRAVENOUS | Status: AC
  Administered 2020-07-29: 10 mL via INTRAVENOUS
  Filled 2020-07-29: qty 10

## 2020-07-29 MED ORDER — HEPARIN SOD (PORK) LOCK FLUSH 100 UNIT/ML IV SOLN
250.0000 [IU] | Freq: Once | INTRAVENOUS | Status: AC
  Administered 2020-07-29: 250 [IU] via INTRAVENOUS
  Filled 2020-07-29: qty 5

## 2020-07-29 NOTE — Progress Notes (Signed)
Pt discharged in no apparent distress. Pt left ambulatory without assistance. Pt aware of discharge instructions and verbalized understanding and had no further questions.  

## 2020-07-29 NOTE — Patient Instructions (Signed)
Tunneled Central Venous Catheter Flushing Guide  It is important to flush your tunneled central venous catheter each time you use it, both before and after you use it. Flushing your catheter will help prevent it from clogging. What are the risks? Risks may include:  Infection.  Air getting into the catheter and bloodstream. Supplies needed:  A clean pair of gloves.  A disinfecting wipe. Use an alcohol wipe, chlorhexidine wipe, or iodine wipe as told by your health care provider.  A 10 mL syringe that has been prefilled with saline solution.  An empty 10 mL syringe, if a substance called heparin was injected into your catheter. How to flush your catheter When you flush your catheter, make sure you follow any specific instructions from your health care provider or the manufacturer. These are general guidelines. Flushing your catheter before use If there is heparin in your catheter: 1. Wash your hands with soap and water. 2. Put on gloves. 3. Scrub the injection cap for a minimum of 15 seconds with a disinfecting wipe. 4. Unclamp the catheter. 5. Attach the empty syringe to the injection cap. 6. Pull the syringe plunger back and withdraw 10 mL of blood. 7. Place the syringe into an appropriate waste container. 8. Scrub the injection cap for 15 seconds with a disinfecting wipe. 9. Attach the prefilled syringe to the injection cap. 10. Flush the catheter by pushing the plunger forward until all the liquid from the syringe is in the catheter. 11. Remove the syringe from the injection cap. 12. Clamp the catheter. If there is no heparin in your catheter: 1. Wash your hands with soap and water. 2. Put on gloves. 3. Scrub the injection cap for 15 seconds with a disinfecting wipe. 4. Unclamp the catheter. 5. Attach the prefilled syringe to the injection cap. 6. Flush the catheter by pushing the plunger forward until 5 mL of the liquid from the syringe is in the catheter. 7. Pull back on  the syringe until you see blood in the catheter. 8. If you have been asked to collect any blood, follow your health care provider's instructions. Otherwise, flush the catheter with the rest of the solution from the syringe. 9. Remove the syringe from the injection cap. 10. Clamp the catheter.  Flushing your catheter after use 1. Wash your hands with soap and water. 2. Put on gloves. 3. Scrub the injection cap for 15 seconds with a disinfecting wipe. 4. Unclamp the catheter. 5. Attach the prefilled syringe to the injection cap. 6. Flush the catheter by pushing the plunger forward until all of the liquid from the syringe is in the catheter. 7. Remove the syringe from the injection cap. 8. Clamp the catheter. Problems and solutions  If blood cannot be completely cleared from the injection cap, you may need to have the injection cap replaced.  If the catheter is difficult to flush, use the pulsing method. The pulsing method involves pushing only a few milliliters of solution into the catheter at a time and pausing between pushes.  If you do not see blood in the catheter when you pull back on the syringe, change your body position, such as by raising your arms above your head. Take a deep breath and cough. Then, pull back on the syringe. If you still do not see blood, flush the catheter with a small amount of solution. Then, change positions again and take a breath or cough. Pull back on the syringe again. If you still do not see   blood, finish flushing the catheter and contact your health care provider. Do not use your catheter until your health care provider says it is okay. General tips  Have someone help you flush your catheter, if possible.  Do not force fluid through your catheter.  Do not use a syringe that is larger or smaller than 10 mL. Using a smaller syringe can make the catheter burst.  Do not use your catheter without flushing it first if it has heparin in it. Contact a health  care provider if:  You cannot see any blood in the catheter when you flush it before using it.  Your catheter is difficult to flush. Get help right away if:  You cannot flush the catheter.  The catheter leaks when you flush it or when there is fluid in it.  There are cracks or breaks in the catheter. Summary  It is important to flush your tunneled central venous catheter each time you use it, both before and after you use it.  Scrub the injection cap for 15 seconds with a disinfecting wipe before and after you flush it.  When you flush your catheter, make sure you follow any specific instructions from your health care provider or the manufacturer.  Get help right away if you cannot flush the catheter. This information is not intended to replace advice given to you by your health care provider. Make sure you discuss any questions you have with your health care provider. Document Revised: 05/05/2019 Document Reviewed: 10/26/2018 Elsevier Patient Education  2020 Elsevier Inc.  

## 2020-07-30 ENCOUNTER — Inpatient Hospital Stay: Payer: Medicare PPO

## 2020-07-30 ENCOUNTER — Other Ambulatory Visit: Payer: Self-pay

## 2020-07-30 ENCOUNTER — Other Ambulatory Visit: Payer: Self-pay | Admitting: Hematology & Oncology

## 2020-07-30 DIAGNOSIS — C9200 Acute myeloblastic leukemia, not having achieved remission: Secondary | ICD-10-CM | POA: Diagnosis not present

## 2020-07-30 DIAGNOSIS — Z79899 Other long term (current) drug therapy: Secondary | ICD-10-CM | POA: Diagnosis not present

## 2020-07-30 DIAGNOSIS — D474 Osteomyelofibrosis: Secondary | ICD-10-CM

## 2020-07-30 DIAGNOSIS — Z7982 Long term (current) use of aspirin: Secondary | ICD-10-CM | POA: Diagnosis not present

## 2020-07-30 DIAGNOSIS — D649 Anemia, unspecified: Secondary | ICD-10-CM

## 2020-07-30 DIAGNOSIS — B9681 Helicobacter pylori [H. pylori] as the cause of diseases classified elsewhere: Secondary | ICD-10-CM | POA: Diagnosis not present

## 2020-07-30 DIAGNOSIS — D45 Polycythemia vera: Secondary | ICD-10-CM | POA: Diagnosis not present

## 2020-07-30 DIAGNOSIS — K255 Chronic or unspecified gastric ulcer with perforation: Secondary | ICD-10-CM | POA: Diagnosis not present

## 2020-07-30 DIAGNOSIS — D631 Anemia in chronic kidney disease: Secondary | ICD-10-CM

## 2020-07-30 LAB — HEPATITIS PANEL, ACUTE
HCV Ab: NONREACTIVE
Hep A IgM: NONREACTIVE
Hep B C IgM: NONREACTIVE
Hepatitis B Surface Ag: NONREACTIVE

## 2020-07-30 MED ORDER — HEPARIN SOD (PORK) LOCK FLUSH 100 UNIT/ML IV SOLN
250.0000 [IU] | INTRAVENOUS | Status: AC | PRN
  Administered 2020-07-30: 250 [IU]
  Filled 2020-07-30: qty 5

## 2020-07-30 MED ORDER — DIPHENHYDRAMINE HCL 25 MG PO CAPS
25.0000 mg | ORAL_CAPSULE | Freq: Once | ORAL | Status: AC
  Administered 2020-07-30: 25 mg via ORAL

## 2020-07-30 MED ORDER — ACETAMINOPHEN 325 MG PO TABS
ORAL_TABLET | ORAL | Status: AC
  Filled 2020-07-30: qty 2

## 2020-07-30 MED ORDER — ACETAMINOPHEN 325 MG PO TABS
650.0000 mg | ORAL_TABLET | Freq: Once | ORAL | Status: AC
  Administered 2020-07-30: 650 mg via ORAL

## 2020-07-30 MED ORDER — DIPHENHYDRAMINE HCL 25 MG PO CAPS
ORAL_CAPSULE | ORAL | Status: AC
  Filled 2020-07-30: qty 1

## 2020-07-30 MED ORDER — SODIUM CHLORIDE 0.9% IV SOLUTION
250.0000 mL | Freq: Once | INTRAVENOUS | Status: AC
  Administered 2020-07-30: 250 mL via INTRAVENOUS
  Filled 2020-07-30: qty 250

## 2020-07-30 MED ORDER — SODIUM CHLORIDE 0.9% FLUSH
10.0000 mL | INTRAVENOUS | Status: AC | PRN
  Administered 2020-07-30: 10 mL
  Filled 2020-07-30: qty 10

## 2020-07-30 NOTE — Patient Instructions (Signed)
Pt discharged in no apparent distress. Pt left ambulatory without assistance. Pt aware of discharge instructions and verbalized understanding and had no further questions.  

## 2020-07-31 LAB — BPAM RBC
Blood Product Expiration Date: 202112172359
Blood Product Expiration Date: 202112172359
ISSUE DATE / TIME: 202112070809
ISSUE DATE / TIME: 202112070809
Unit Type and Rh: 7300
Unit Type and Rh: 7300

## 2020-07-31 LAB — TYPE AND SCREEN
ABO/RH(D): B POS
Antibody Screen: NEGATIVE
Unit division: 0
Unit division: 0

## 2020-08-01 ENCOUNTER — Inpatient Hospital Stay: Payer: Medicare PPO

## 2020-08-01 ENCOUNTER — Other Ambulatory Visit: Payer: Self-pay

## 2020-08-01 ENCOUNTER — Inpatient Hospital Stay (HOSPITAL_BASED_OUTPATIENT_CLINIC_OR_DEPARTMENT_OTHER): Payer: Medicare PPO | Admitting: Hematology & Oncology

## 2020-08-01 ENCOUNTER — Encounter: Payer: Self-pay | Admitting: Hematology & Oncology

## 2020-08-01 VITALS — BP 144/55 | HR 81 | Temp 98.3°F | Resp 19

## 2020-08-01 VITALS — Wt 154.0 lb

## 2020-08-01 DIAGNOSIS — Z452 Encounter for adjustment and management of vascular access device: Secondary | ICD-10-CM

## 2020-08-01 DIAGNOSIS — C9200 Acute myeloblastic leukemia, not having achieved remission: Secondary | ICD-10-CM

## 2020-08-01 DIAGNOSIS — Z79899 Other long term (current) drug therapy: Secondary | ICD-10-CM | POA: Diagnosis not present

## 2020-08-01 DIAGNOSIS — Z7982 Long term (current) use of aspirin: Secondary | ICD-10-CM | POA: Diagnosis not present

## 2020-08-01 DIAGNOSIS — D45 Polycythemia vera: Secondary | ICD-10-CM | POA: Diagnosis not present

## 2020-08-01 DIAGNOSIS — B9681 Helicobacter pylori [H. pylori] as the cause of diseases classified elsewhere: Secondary | ICD-10-CM | POA: Diagnosis not present

## 2020-08-01 DIAGNOSIS — K255 Chronic or unspecified gastric ulcer with perforation: Secondary | ICD-10-CM | POA: Diagnosis not present

## 2020-08-01 LAB — CBC WITH DIFFERENTIAL (CANCER CENTER ONLY)
Abs Immature Granulocytes: 0.18 10*3/uL — ABNORMAL HIGH (ref 0.00–0.07)
Basophils Absolute: 0 10*3/uL (ref 0.0–0.1)
Basophils Relative: 1 %
Eosinophils Absolute: 0 10*3/uL (ref 0.0–0.5)
Eosinophils Relative: 0 %
HCT: 24.6 % — ABNORMAL LOW (ref 39.0–52.0)
Hemoglobin: 7.9 g/dL — ABNORMAL LOW (ref 13.0–17.0)
Immature Granulocytes: 12 %
Lymphocytes Relative: 24 %
Lymphs Abs: 0.4 10*3/uL — ABNORMAL LOW (ref 0.7–4.0)
MCH: 27.8 pg (ref 26.0–34.0)
MCHC: 32.1 g/dL (ref 30.0–36.0)
MCV: 86.6 fL (ref 80.0–100.0)
Monocytes Absolute: 0.4 10*3/uL (ref 0.1–1.0)
Monocytes Relative: 26 %
Neutro Abs: 0.6 10*3/uL — ABNORMAL LOW (ref 1.7–7.7)
Neutrophils Relative %: 37 %
Platelet Count: 39 10*3/uL — ABNORMAL LOW (ref 150–400)
RBC: 2.84 MIL/uL — ABNORMAL LOW (ref 4.22–5.81)
RDW: 18.5 % — ABNORMAL HIGH (ref 11.5–15.5)
WBC Count: 1.5 10*3/uL — ABNORMAL LOW (ref 4.0–10.5)
nRBC: 5.9 % — ABNORMAL HIGH (ref 0.0–0.2)

## 2020-08-01 LAB — CMP (CANCER CENTER ONLY)
ALT: 33 U/L (ref 0–44)
AST: 17 U/L (ref 15–41)
Albumin: 3.5 g/dL (ref 3.5–5.0)
Alkaline Phosphatase: 347 U/L — ABNORMAL HIGH (ref 38–126)
Anion gap: 8 (ref 5–15)
BUN: 23 mg/dL (ref 8–23)
CO2: 23 mmol/L (ref 22–32)
Calcium: 8.5 mg/dL — ABNORMAL LOW (ref 8.9–10.3)
Chloride: 101 mmol/L (ref 98–111)
Creatinine: 0.8 mg/dL (ref 0.61–1.24)
GFR, Estimated: >60 mL/min (ref >60)
Glucose, Bld: 133 mg/dL — ABNORMAL HIGH (ref 70–99)
Potassium: 4 mmol/L (ref 3.5–5.1)
Sodium: 132 mmol/L — ABNORMAL LOW (ref 135–145)
Total Bilirubin: 1.6 mg/dL — ABNORMAL HIGH (ref 0.3–1.2)
Total Protein: 5.7 g/dL — ABNORMAL LOW (ref 6.5–8.1)

## 2020-08-01 LAB — SAMPLE TO BLOOD BANK

## 2020-08-01 LAB — LACTATE DEHYDROGENASE: LDH: 736 U/L — ABNORMAL HIGH (ref 98–192)

## 2020-08-01 LAB — SAVE SMEAR(SSMR), FOR PROVIDER SLIDE REVIEW

## 2020-08-01 MED ORDER — SODIUM CHLORIDE 0.9% FLUSH
10.0000 mL | Freq: Once | INTRAVENOUS | Status: AC
  Administered 2020-08-01: 10 mL via INTRAVENOUS
  Filled 2020-08-01: qty 10

## 2020-08-01 MED ORDER — HEPARIN SOD (PORK) LOCK FLUSH 100 UNIT/ML IV SOLN
500.0000 [IU] | Freq: Once | INTRAVENOUS | Status: AC
  Administered 2020-08-01: 500 [IU] via INTRAVENOUS
  Filled 2020-08-01: qty 5

## 2020-08-01 NOTE — Progress Notes (Signed)
Hematology and Oncology Follow Up Visit  Benjamin [name] [id number] 16-Mar-1951 69 y.o. 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  He feels better after he had the transfusion couple days ago.  He was feeling quite tired on Monday.  He sees his doctor at [facility] on Monday.  He may or may not get a bone marrow biopsy at that time.  His liver function studies have normalized.  He is back on venetoclax.  He has had no fever.  He has had no bleeding.  Is no diarrhea.  He has had no nausea or vomiting.  His labs have been looking pretty good.  His alkaline phosphatase has come down which is nice to see.  His platelet count is slowly trending upward.  There is been no rashes.  He does have chronic leg swelling, more in the right leg than the left.  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  Of note, his hepatitis serologies were all negative.  Overall, his performance status is ECOG 1.  Medications:  Allergies as of 08/01/2020      Reactions   Aleve [naproxen Sodium] Other (See Comments)   Pt thinks it might be all NSAIDs but is unsure. Caused him to have emergency stomach surgery.   Biaxin [clarithromycin] Other (See Comments)   Joint pains   Cephalexin Hives, Rash   Flagyl [metronidazole] Other (See Comments)   Joint pain   Morphine And Related Hives      Medication List       Accurate as of August 01, 2020  4:27 PM. If you have any questions, ask your nurse  or doctor.        allopurinol 300 MG tablet Commonly known as: ZYLOPRIM Take 300 mg by mouth daily.   Magnesium 400 MG Caps Take 1 capsule by mouth daily.   testosterone 4 MG/24HR Pt24 patch Commonly known as: ANDRODERM Place 1 patch onto the skin daily.   Vitamin D 50 MCG (2000 UT) Caps Take by mouth.       Allergies:  Allergies  Allergen Reactions  . Aleve [Naproxen Sodium] Other (See Comments)    Pt thinks it might be all NSAIDs but is unsure. Caused him to have emergency stomach surgery.  . Biaxin [Clarithromycin] Other (See Comments)    Joint pains  . Cephalexin Hives and Rash  . Flagyl [Metronidazole] Other (See Comments)    Joint pain  . Morphine And Related Hives    Past Medical History, Surgical history, Social history, and Family History were reviewed and updated.  Review of Systems:  Review of Systems  Constitutional: Negative.   HENT: Negative.   Eyes: Negative.   Respiratory: Negative.   Cardiovascular: Negative.   Gastrointestinal: Negative.   Genitourinary: Negative.   Musculoskeletal: Negative.   Skin: Negative.   Neurological: Negative.   Endo/Heme/Allergies: Negative.   Psychiatric/Behavioral: Negative.     Physical Exam:  weight is 154  lb (69.9 kg).   Wt Readings from Last 3 Encounters:  08/01/20 154 lb (69.9 kg)  07/01/20 162 lb (73.5 kg)  05/31/20 165 lb (74.8 kg)    Physical Exam Vitals reviewed.  HENT:     Head: Normocephalic and atraumatic.  Eyes:     Pupils: Pupils are equal, round, and reactive to light.  Cardiovascular:     Rate and Rhythm: Normal rate and regular rhythm.     Heart sounds: Normal heart sounds.  Pulmonary:     Effort: Pulmonary effort is normal.     Breath sounds: Normal breath sounds.  Abdominal:     General: Bowel sounds are normal.     Palpations: Abdomen is soft.     Comments: Abdominal exam shows a soft abdomen.  He has decent bowel sounds.  There is no fluid wave.  His spleen tip is down to  the umbilicus.  There is no hepatomegaly.  Musculoskeletal:        General: No tenderness or deformity. Normal range of motion.     Cervical back: Normal range of motion.     Comments: He does have about 1-2+ edema in his legs bilaterally.  Lymphadenopathy:     Cervical: No cervical adenopathy.  Skin:    General: Skin is warm and dry.     Findings: No erythema or rash.  Neurological:     Mental Status: He is alert and oriented to person, place, and time.  Psychiatric:        Behavior: Behavior normal.        Thought Content: Thought content normal.        Judgment: Judgment normal.    Lab Results  Component Value Date   WBC 1.5 (L) 08/01/2020   HGB 7.9 (L) 08/01/2020   HCT 24.6 (L) 08/01/2020   MCV 86.6 08/01/2020   PLT 39 (L) 08/01/2020   Lab Results  Component Value Date   FERRITIN 1,087 (H) 07/01/2020   IRON 176 (H) 07/01/2020   TIBC 195 (L) 07/01/2020   UIBC 19 (L) 07/01/2020   IRONPCTSAT 90 (H) 07/01/2020   Lab Results  Component Value Date   RETICCTPCT 12.1 (H) 07/01/2020   RBC 2.84 (L) 08/01/2020   RETICCTABS 173.6 02/27/2015   No results found for: KPAFRELGTCHN, LAMBDASER, KAPLAMBRATIO No results found for: IGGSERUM, IGA, IGMSERUM No results found for: Kathrynn Ducking, MSPIKE, SPEI   Chemistry      Component Value Date/Time   NA 132 (L) 08/01/2020 1425   NA 141 07/19/2017 1417   NA 141 05/14/2016 1202   K 4.0 08/01/2020 1425   K 4.6 07/19/2017 1417   K 4.8 05/14/2016 1202   CL 101 08/01/2020 1425   CL 103 07/19/2017 1417   CO2 23 08/01/2020 1425   CO2 30 07/19/2017 1417   CO2 26 05/14/2016 1202   BUN 23 08/01/2020 1425   BUN 13 07/19/2017 1417   BUN 14.0 05/14/2016 1202   CREATININE 0.80 08/01/2020 1425   CREATININE 0.9 07/19/2017 1417   CREATININE 1.0 05/14/2016 1202      Component Value Date/Time   CALCIUM 8.5 (L) 08/01/2020 1425   CALCIUM 9.1 07/19/2017 1417   CALCIUM 9.2 05/14/2016 1202    ALKPHOS 347 (H) 08/01/2020 1425   ALKPHOS 870 (H) 07/19/2017 1417   ALKPHOS 913 (H) 05/14/2016 1202   AST 17 08/01/2020 1425   AST 39 (H) 05/14/2016 1202   ALT 33 08/01/2020 1425  ALT 50 (H) 07/19/2017 1417   ALT 41 05/14/2016 1202   BILITOT 1.6 (H) 08/01/2020 1425   BILITOT 1.00 05/14/2016 1202      Impression and Plan: Benjamin [name] is a very pleasant 69 yo caucasian gentleman with polycythemia vera, JAK2 positive.  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  I think he had normal cytogenetics.  He did not have any genetic markers that could use targeted therapy.  He has had his first cycle of treatment.  Overall, he is tolerating his chemotherapy quite nicely.  Hopefully, we will see response.  Today, he does not need any transfusions.  His encouraging that the platelet count is going up a little bit.  I'm very happy that the hepatitis serologies were negative.  I'm happy that his liver function studies have normalized.  For right now, we will continue to monitor him twice a week.  He'll not need to come in this upcoming Monday because of his visit at [facility].  We will have his labs checked next Thursday.  I'll plan to see him back in a couple weeks.       Volanda Napoleon, MD 12/9/20214:27 PM

## 2020-08-02 ENCOUNTER — Telehealth: Payer: Self-pay | Admitting: Hematology & Oncology

## 2020-08-02 NOTE — Telephone Encounter (Signed)
Appointments scheduled calendar printed & mailed per 12/9 los 

## 2020-08-05 ENCOUNTER — Ambulatory Visit: Payer: Medicare PPO | Admitting: Hematology & Oncology

## 2020-08-05 DIAGNOSIS — I272 Pulmonary hypertension, unspecified: Secondary | ICD-10-CM | POA: Diagnosis not present

## 2020-08-05 DIAGNOSIS — K219 Gastro-esophageal reflux disease without esophagitis: Secondary | ICD-10-CM | POA: Diagnosis not present

## 2020-08-05 DIAGNOSIS — K259 Gastric ulcer, unspecified as acute or chronic, without hemorrhage or perforation: Secondary | ICD-10-CM | POA: Diagnosis not present

## 2020-08-05 DIAGNOSIS — R748 Abnormal levels of other serum enzymes: Secondary | ICD-10-CM | POA: Diagnosis not present

## 2020-08-05 DIAGNOSIS — R945 Abnormal results of liver function studies: Secondary | ICD-10-CM | POA: Diagnosis not present

## 2020-08-05 DIAGNOSIS — Z95828 Presence of other vascular implants and grafts: Secondary | ICD-10-CM | POA: Diagnosis not present

## 2020-08-05 DIAGNOSIS — Z86718 Personal history of other venous thrombosis and embolism: Secondary | ICD-10-CM | POA: Diagnosis not present

## 2020-08-05 DIAGNOSIS — K8020 Calculus of gallbladder without cholecystitis without obstruction: Secondary | ICD-10-CM | POA: Diagnosis not present

## 2020-08-05 DIAGNOSIS — C9200 Acute myeloblastic leukemia, not having achieved remission: Secondary | ICD-10-CM | POA: Diagnosis not present

## 2020-08-05 DIAGNOSIS — R7401 Elevation of levels of liver transaminase levels: Secondary | ICD-10-CM | POA: Diagnosis not present

## 2020-08-05 DIAGNOSIS — D45 Polycythemia vera: Secondary | ICD-10-CM | POA: Diagnosis not present

## 2020-08-05 DIAGNOSIS — R112 Nausea with vomiting, unspecified: Secondary | ICD-10-CM | POA: Diagnosis not present

## 2020-08-05 DIAGNOSIS — R197 Diarrhea, unspecified: Secondary | ICD-10-CM | POA: Diagnosis not present

## 2020-08-07 ENCOUNTER — Other Ambulatory Visit: Payer: Self-pay | Admitting: Hematology & Oncology

## 2020-08-07 DIAGNOSIS — R748 Abnormal levels of other serum enzymes: Secondary | ICD-10-CM | POA: Diagnosis not present

## 2020-08-07 DIAGNOSIS — D471 Chronic myeloproliferative disease: Secondary | ICD-10-CM | POA: Diagnosis not present

## 2020-08-08 ENCOUNTER — Inpatient Hospital Stay: Payer: Medicare PPO

## 2020-08-09 DIAGNOSIS — D471 Chronic myeloproliferative disease: Secondary | ICD-10-CM | POA: Diagnosis not present

## 2020-08-12 ENCOUNTER — Telehealth: Payer: Self-pay | Admitting: Hematology & Oncology

## 2020-08-12 ENCOUNTER — Inpatient Hospital Stay: Payer: Medicare PPO

## 2020-08-12 DIAGNOSIS — Z79899 Other long term (current) drug therapy: Secondary | ICD-10-CM | POA: Diagnosis not present

## 2020-08-12 DIAGNOSIS — Z95828 Presence of other vascular implants and grafts: Secondary | ICD-10-CM | POA: Diagnosis not present

## 2020-08-12 DIAGNOSIS — R791 Abnormal coagulation profile: Secondary | ICD-10-CM | POA: Diagnosis not present

## 2020-08-12 DIAGNOSIS — D709 Neutropenia, unspecified: Secondary | ICD-10-CM | POA: Diagnosis not present

## 2020-08-12 DIAGNOSIS — D7581 Myelofibrosis: Secondary | ICD-10-CM | POA: Diagnosis not present

## 2020-08-12 DIAGNOSIS — M720 Palmar fascial fibromatosis [Dupuytren]: Secondary | ICD-10-CM | POA: Diagnosis not present

## 2020-08-12 DIAGNOSIS — Z86718 Personal history of other venous thrombosis and embolism: Secondary | ICD-10-CM | POA: Diagnosis not present

## 2020-08-12 DIAGNOSIS — E883 Tumor lysis syndrome: Secondary | ICD-10-CM | POA: Diagnosis not present

## 2020-08-12 DIAGNOSIS — I272 Pulmonary hypertension, unspecified: Secondary | ICD-10-CM | POA: Diagnosis not present

## 2020-08-12 DIAGNOSIS — Z7901 Long term (current) use of anticoagulants: Secondary | ICD-10-CM | POA: Diagnosis not present

## 2020-08-12 DIAGNOSIS — C9200 Acute myeloblastic leukemia, not having achieved remission: Secondary | ICD-10-CM | POA: Diagnosis not present

## 2020-08-12 NOTE — Telephone Encounter (Signed)
Called and LMVM for patient about las appointment that has been added on per 12/20 sch msg

## 2020-08-13 DIAGNOSIS — C9200 Acute myeloblastic leukemia, not having achieved remission: Secondary | ICD-10-CM | POA: Diagnosis not present

## 2020-08-13 DIAGNOSIS — Z5111 Encounter for antineoplastic chemotherapy: Secondary | ICD-10-CM | POA: Diagnosis not present

## 2020-08-14 DIAGNOSIS — C9200 Acute myeloblastic leukemia, not having achieved remission: Secondary | ICD-10-CM | POA: Diagnosis not present

## 2020-08-14 DIAGNOSIS — Z5111 Encounter for antineoplastic chemotherapy: Secondary | ICD-10-CM | POA: Diagnosis not present

## 2020-08-15 ENCOUNTER — Ambulatory Visit: Payer: Medicare PPO | Admitting: Hematology & Oncology

## 2020-08-15 ENCOUNTER — Telehealth: Payer: Self-pay | Admitting: *Deleted

## 2020-08-15 ENCOUNTER — Inpatient Hospital Stay: Payer: Medicare PPO

## 2020-08-15 DIAGNOSIS — D45 Polycythemia vera: Secondary | ICD-10-CM | POA: Diagnosis not present

## 2020-08-15 DIAGNOSIS — C9200 Acute myeloblastic leukemia, not having achieved remission: Secondary | ICD-10-CM | POA: Diagnosis not present

## 2020-08-15 DIAGNOSIS — R748 Abnormal levels of other serum enzymes: Secondary | ICD-10-CM | POA: Diagnosis not present

## 2020-08-15 NOTE — Telephone Encounter (Signed)
Call placed to patient to check on his status after several no shows to this office.  Message left to inform pt to call this office back with update.

## 2020-08-15 NOTE — Telephone Encounter (Signed)
Call received from patient stating that he will be receiving his treatments at [facility] with Dr. Joan Mayans and would like all of his appts canceled at this office.  Dr. Marin Olp notified.

## 2020-08-20 DIAGNOSIS — D471 Chronic myeloproliferative disease: Secondary | ICD-10-CM | POA: Diagnosis not present

## 2020-08-20 DIAGNOSIS — D45 Polycythemia vera: Secondary | ICD-10-CM | POA: Diagnosis not present

## 2020-08-22 DIAGNOSIS — D45 Polycythemia vera: Secondary | ICD-10-CM | POA: Diagnosis not present

## 2020-08-22 DIAGNOSIS — C9200 Acute myeloblastic leukemia, not having achieved remission: Secondary | ICD-10-CM | POA: Diagnosis not present

## 2020-08-22 DIAGNOSIS — D471 Chronic myeloproliferative disease: Secondary | ICD-10-CM | POA: Diagnosis not present

## 2020-08-27 DIAGNOSIS — R748 Abnormal levels of other serum enzymes: Secondary | ICD-10-CM | POA: Diagnosis not present

## 2020-08-27 DIAGNOSIS — D45 Polycythemia vera: Secondary | ICD-10-CM | POA: Diagnosis not present

## 2020-08-27 DIAGNOSIS — D471 Chronic myeloproliferative disease: Secondary | ICD-10-CM | POA: Diagnosis not present

## 2020-08-27 DIAGNOSIS — C9200 Acute myeloblastic leukemia, not having achieved remission: Secondary | ICD-10-CM | POA: Diagnosis not present

## 2020-08-30 DIAGNOSIS — R197 Diarrhea, unspecified: Secondary | ICD-10-CM | POA: Diagnosis not present

## 2020-08-30 DIAGNOSIS — D45 Polycythemia vera: Secondary | ICD-10-CM | POA: Diagnosis not present

## 2020-08-30 DIAGNOSIS — Z20822 Contact with and (suspected) exposure to covid-19: Secondary | ICD-10-CM | POA: Diagnosis not present

## 2020-08-30 DIAGNOSIS — C9200 Acute myeloblastic leukemia, not having achieved remission: Secondary | ICD-10-CM | POA: Diagnosis not present

## 2020-08-30 DIAGNOSIS — D471 Chronic myeloproliferative disease: Secondary | ICD-10-CM | POA: Diagnosis not present

## 2020-09-02 DIAGNOSIS — D45 Polycythemia vera: Secondary | ICD-10-CM | POA: Diagnosis not present

## 2020-09-02 DIAGNOSIS — D471 Chronic myeloproliferative disease: Secondary | ICD-10-CM | POA: Diagnosis not present

## 2020-09-02 DIAGNOSIS — C9200 Acute myeloblastic leukemia, not having achieved remission: Secondary | ICD-10-CM | POA: Diagnosis not present

## 2020-09-05 DIAGNOSIS — R197 Diarrhea, unspecified: Secondary | ICD-10-CM | POA: Diagnosis not present

## 2020-09-05 DIAGNOSIS — R945 Abnormal results of liver function studies: Secondary | ICD-10-CM | POA: Diagnosis not present

## 2020-09-05 DIAGNOSIS — Z86718 Personal history of other venous thrombosis and embolism: Secondary | ICD-10-CM | POA: Diagnosis not present

## 2020-09-05 DIAGNOSIS — R600 Localized edema: Secondary | ICD-10-CM | POA: Diagnosis not present

## 2020-09-05 DIAGNOSIS — R112 Nausea with vomiting, unspecified: Secondary | ICD-10-CM | POA: Diagnosis not present

## 2020-09-05 DIAGNOSIS — D471 Chronic myeloproliferative disease: Secondary | ICD-10-CM | POA: Diagnosis not present

## 2020-09-05 DIAGNOSIS — I272 Pulmonary hypertension, unspecified: Secondary | ICD-10-CM | POA: Diagnosis not present

## 2020-09-05 DIAGNOSIS — R188 Other ascites: Secondary | ICD-10-CM | POA: Diagnosis not present

## 2020-09-05 DIAGNOSIS — C9200 Acute myeloblastic leukemia, not having achieved remission: Secondary | ICD-10-CM | POA: Diagnosis not present

## 2020-09-05 DIAGNOSIS — K259 Gastric ulcer, unspecified as acute or chronic, without hemorrhage or perforation: Secondary | ICD-10-CM | POA: Diagnosis not present

## 2020-09-05 DIAGNOSIS — J90 Pleural effusion, not elsewhere classified: Secondary | ICD-10-CM | POA: Diagnosis not present

## 2020-09-05 DIAGNOSIS — K219 Gastro-esophageal reflux disease without esophagitis: Secondary | ICD-10-CM | POA: Diagnosis not present

## 2020-09-05 DIAGNOSIS — E883 Tumor lysis syndrome: Secondary | ICD-10-CM | POA: Diagnosis not present

## 2020-09-05 DIAGNOSIS — K7460 Unspecified cirrhosis of liver: Secondary | ICD-10-CM | POA: Diagnosis not present

## 2020-09-05 DIAGNOSIS — M720 Palmar fascial fibromatosis [Dupuytren]: Secondary | ICD-10-CM | POA: Diagnosis not present

## 2020-09-05 DIAGNOSIS — C8400 Mycosis fungoides, unspecified site: Secondary | ICD-10-CM | POA: Diagnosis not present

## 2020-09-05 DIAGNOSIS — R5383 Other fatigue: Secondary | ICD-10-CM | POA: Diagnosis not present

## 2020-09-10 DIAGNOSIS — D45 Polycythemia vera: Secondary | ICD-10-CM | POA: Diagnosis not present

## 2020-09-10 DIAGNOSIS — C9200 Acute myeloblastic leukemia, not having achieved remission: Secondary | ICD-10-CM | POA: Diagnosis not present

## 2020-09-12 DIAGNOSIS — R600 Localized edema: Secondary | ICD-10-CM | POA: Diagnosis not present

## 2020-09-12 DIAGNOSIS — D471 Chronic myeloproliferative disease: Secondary | ICD-10-CM | POA: Diagnosis not present

## 2020-09-12 DIAGNOSIS — M720 Palmar fascial fibromatosis [Dupuytren]: Secondary | ICD-10-CM | POA: Diagnosis not present

## 2020-09-12 DIAGNOSIS — C9200 Acute myeloblastic leukemia, not having achieved remission: Secondary | ICD-10-CM | POA: Diagnosis not present

## 2020-09-12 DIAGNOSIS — Z95828 Presence of other vascular implants and grafts: Secondary | ICD-10-CM | POA: Diagnosis not present

## 2020-09-12 DIAGNOSIS — Z86718 Personal history of other venous thrombosis and embolism: Secondary | ICD-10-CM | POA: Diagnosis not present

## 2020-09-12 DIAGNOSIS — I272 Pulmonary hypertension, unspecified: Secondary | ICD-10-CM | POA: Diagnosis not present

## 2020-09-13 DIAGNOSIS — K8020 Calculus of gallbladder without cholecystitis without obstruction: Secondary | ICD-10-CM | POA: Diagnosis not present

## 2020-09-13 DIAGNOSIS — K7460 Unspecified cirrhosis of liver: Secondary | ICD-10-CM | POA: Diagnosis not present

## 2020-09-13 DIAGNOSIS — R188 Other ascites: Secondary | ICD-10-CM | POA: Diagnosis not present

## 2020-09-16 DIAGNOSIS — D45 Polycythemia vera: Secondary | ICD-10-CM | POA: Diagnosis not present

## 2020-09-16 DIAGNOSIS — C9200 Acute myeloblastic leukemia, not having achieved remission: Secondary | ICD-10-CM | POA: Diagnosis not present

## 2020-09-19 DIAGNOSIS — C9200 Acute myeloblastic leukemia, not having achieved remission: Secondary | ICD-10-CM | POA: Diagnosis not present

## 2020-09-23 DIAGNOSIS — D471 Chronic myeloproliferative disease: Secondary | ICD-10-CM | POA: Diagnosis not present

## 2020-09-23 DIAGNOSIS — J90 Pleural effusion, not elsewhere classified: Secondary | ICD-10-CM | POA: Diagnosis not present

## 2020-09-23 DIAGNOSIS — R600 Localized edema: Secondary | ICD-10-CM | POA: Diagnosis not present

## 2020-09-23 DIAGNOSIS — R140 Abdominal distension (gaseous): Secondary | ICD-10-CM | POA: Diagnosis not present

## 2020-09-23 DIAGNOSIS — C9200 Acute myeloblastic leukemia, not having achieved remission: Secondary | ICD-10-CM | POA: Diagnosis not present

## 2020-09-23 DIAGNOSIS — K219 Gastro-esophageal reflux disease without esophagitis: Secondary | ICD-10-CM | POA: Diagnosis not present

## 2020-09-23 DIAGNOSIS — K7460 Unspecified cirrhosis of liver: Secondary | ICD-10-CM | POA: Diagnosis not present

## 2020-09-23 DIAGNOSIS — D7581 Myelofibrosis: Secondary | ICD-10-CM | POA: Diagnosis not present

## 2020-09-23 DIAGNOSIS — Z86718 Personal history of other venous thrombosis and embolism: Secondary | ICD-10-CM | POA: Diagnosis not present

## 2020-09-24 DIAGNOSIS — C9200 Acute myeloblastic leukemia, not having achieved remission: Secondary | ICD-10-CM | POA: Diagnosis not present

## 2020-09-24 DIAGNOSIS — Z5111 Encounter for antineoplastic chemotherapy: Secondary | ICD-10-CM | POA: Diagnosis not present

## 2020-09-25 DIAGNOSIS — Z5111 Encounter for antineoplastic chemotherapy: Secondary | ICD-10-CM | POA: Diagnosis not present

## 2020-09-25 DIAGNOSIS — Z79899 Other long term (current) drug therapy: Secondary | ICD-10-CM | POA: Diagnosis not present

## 2020-09-25 DIAGNOSIS — C9200 Acute myeloblastic leukemia, not having achieved remission: Secondary | ICD-10-CM | POA: Diagnosis not present

## 2020-09-26 DIAGNOSIS — C9200 Acute myeloblastic leukemia, not having achieved remission: Secondary | ICD-10-CM | POA: Diagnosis not present

## 2020-09-26 DIAGNOSIS — Z5111 Encounter for antineoplastic chemotherapy: Secondary | ICD-10-CM | POA: Diagnosis not present

## 2020-09-26 DIAGNOSIS — Z79899 Other long term (current) drug therapy: Secondary | ICD-10-CM | POA: Diagnosis not present

## 2020-09-27 DIAGNOSIS — Z5111 Encounter for antineoplastic chemotherapy: Secondary | ICD-10-CM | POA: Diagnosis not present

## 2020-09-27 DIAGNOSIS — C9200 Acute myeloblastic leukemia, not having achieved remission: Secondary | ICD-10-CM | POA: Diagnosis not present

## 2020-09-27 DIAGNOSIS — Z79899 Other long term (current) drug therapy: Secondary | ICD-10-CM | POA: Diagnosis not present

## 2020-09-30 DIAGNOSIS — Z86718 Personal history of other venous thrombosis and embolism: Secondary | ICD-10-CM | POA: Diagnosis not present

## 2020-09-30 DIAGNOSIS — J918 Pleural effusion in other conditions classified elsewhere: Secondary | ICD-10-CM | POA: Diagnosis not present

## 2020-09-30 DIAGNOSIS — R600 Localized edema: Secondary | ICD-10-CM | POA: Diagnosis not present

## 2020-09-30 DIAGNOSIS — K7402 Hepatic fibrosis, advanced fibrosis: Secondary | ICD-10-CM | POA: Diagnosis not present

## 2020-09-30 DIAGNOSIS — R112 Nausea with vomiting, unspecified: Secondary | ICD-10-CM | POA: Diagnosis not present

## 2020-09-30 DIAGNOSIS — Z95828 Presence of other vascular implants and grafts: Secondary | ICD-10-CM | POA: Diagnosis not present

## 2020-09-30 DIAGNOSIS — I272 Pulmonary hypertension, unspecified: Secondary | ICD-10-CM | POA: Diagnosis not present

## 2020-09-30 DIAGNOSIS — K766 Portal hypertension: Secondary | ICD-10-CM | POA: Diagnosis not present

## 2020-09-30 DIAGNOSIS — D696 Thrombocytopenia, unspecified: Secondary | ICD-10-CM | POA: Diagnosis not present

## 2020-09-30 DIAGNOSIS — D471 Chronic myeloproliferative disease: Secondary | ICD-10-CM | POA: Diagnosis not present

## 2020-09-30 DIAGNOSIS — K7460 Unspecified cirrhosis of liver: Secondary | ICD-10-CM | POA: Diagnosis not present

## 2020-09-30 DIAGNOSIS — J90 Pleural effusion, not elsewhere classified: Secondary | ICD-10-CM | POA: Diagnosis not present

## 2020-09-30 DIAGNOSIS — R197 Diarrhea, unspecified: Secondary | ICD-10-CM | POA: Diagnosis not present

## 2020-09-30 DIAGNOSIS — C9200 Acute myeloblastic leukemia, not having achieved remission: Secondary | ICD-10-CM | POA: Diagnosis not present

## 2020-09-30 DIAGNOSIS — D630 Anemia in neoplastic disease: Secondary | ICD-10-CM | POA: Diagnosis not present

## 2020-10-03 DIAGNOSIS — D696 Thrombocytopenia, unspecified: Secondary | ICD-10-CM | POA: Diagnosis not present

## 2020-10-03 DIAGNOSIS — C9200 Acute myeloblastic leukemia, not having achieved remission: Secondary | ICD-10-CM | POA: Diagnosis not present

## 2020-10-07 DIAGNOSIS — R600 Localized edema: Secondary | ICD-10-CM | POA: Diagnosis not present

## 2020-10-07 DIAGNOSIS — C9200 Acute myeloblastic leukemia, not having achieved remission: Secondary | ICD-10-CM | POA: Diagnosis not present

## 2020-10-07 DIAGNOSIS — Z86718 Personal history of other venous thrombosis and embolism: Secondary | ICD-10-CM | POA: Diagnosis not present

## 2020-10-07 DIAGNOSIS — D61818 Other pancytopenia: Secondary | ICD-10-CM | POA: Diagnosis not present

## 2020-10-07 DIAGNOSIS — D45 Polycythemia vera: Secondary | ICD-10-CM | POA: Diagnosis not present

## 2020-10-07 DIAGNOSIS — Z95828 Presence of other vascular implants and grafts: Secondary | ICD-10-CM | POA: Diagnosis not present

## 2020-10-07 DIAGNOSIS — K7460 Unspecified cirrhosis of liver: Secondary | ICD-10-CM | POA: Diagnosis not present

## 2020-10-14 DIAGNOSIS — K7460 Unspecified cirrhosis of liver: Secondary | ICD-10-CM | POA: Diagnosis not present

## 2020-10-14 DIAGNOSIS — D61818 Other pancytopenia: Secondary | ICD-10-CM | POA: Diagnosis not present

## 2020-10-14 DIAGNOSIS — C9200 Acute myeloblastic leukemia, not having achieved remission: Secondary | ICD-10-CM | POA: Diagnosis not present

## 2020-10-14 DIAGNOSIS — R600 Localized edema: Secondary | ICD-10-CM | POA: Diagnosis not present

## 2020-10-14 DIAGNOSIS — M720 Palmar fascial fibromatosis [Dupuytren]: Secondary | ICD-10-CM | POA: Diagnosis not present

## 2020-10-14 DIAGNOSIS — Z95828 Presence of other vascular implants and grafts: Secondary | ICD-10-CM | POA: Diagnosis not present

## 2020-10-17 DIAGNOSIS — D45 Polycythemia vera: Secondary | ICD-10-CM | POA: Diagnosis not present

## 2020-10-17 DIAGNOSIS — C9200 Acute myeloblastic leukemia, not having achieved remission: Secondary | ICD-10-CM | POA: Diagnosis not present

## 2020-10-21 DIAGNOSIS — C9200 Acute myeloblastic leukemia, not having achieved remission: Secondary | ICD-10-CM | POA: Diagnosis not present

## 2020-10-21 DIAGNOSIS — Z86718 Personal history of other venous thrombosis and embolism: Secondary | ICD-10-CM | POA: Diagnosis not present

## 2020-10-21 DIAGNOSIS — R609 Edema, unspecified: Secondary | ICD-10-CM | POA: Diagnosis not present

## 2020-10-21 DIAGNOSIS — K7460 Unspecified cirrhosis of liver: Secondary | ICD-10-CM | POA: Diagnosis not present

## 2020-10-21 DIAGNOSIS — Z959 Presence of cardiac and vascular implant and graft, unspecified: Secondary | ICD-10-CM | POA: Diagnosis not present

## 2020-10-21 DIAGNOSIS — D696 Thrombocytopenia, unspecified: Secondary | ICD-10-CM | POA: Diagnosis not present

## 2020-10-21 DIAGNOSIS — I272 Pulmonary hypertension, unspecified: Secondary | ICD-10-CM | POA: Diagnosis not present

## 2020-10-21 DIAGNOSIS — Z95828 Presence of other vascular implants and grafts: Secondary | ICD-10-CM | POA: Diagnosis not present

## 2020-10-21 DIAGNOSIS — Z7901 Long term (current) use of anticoagulants: Secondary | ICD-10-CM | POA: Diagnosis not present

## 2020-10-21 DIAGNOSIS — R600 Localized edema: Secondary | ICD-10-CM | POA: Diagnosis not present

## 2020-10-21 DIAGNOSIS — J90 Pleural effusion, not elsewhere classified: Secondary | ICD-10-CM | POA: Diagnosis not present

## 2020-10-24 DIAGNOSIS — D696 Thrombocytopenia, unspecified: Secondary | ICD-10-CM | POA: Diagnosis not present

## 2020-10-24 DIAGNOSIS — C9200 Acute myeloblastic leukemia, not having achieved remission: Secondary | ICD-10-CM | POA: Diagnosis not present

## 2020-10-25 DIAGNOSIS — Z9221 Personal history of antineoplastic chemotherapy: Secondary | ICD-10-CM | POA: Diagnosis not present

## 2020-10-25 DIAGNOSIS — R748 Abnormal levels of other serum enzymes: Secondary | ICD-10-CM | POA: Diagnosis not present

## 2020-10-25 DIAGNOSIS — D696 Thrombocytopenia, unspecified: Secondary | ICD-10-CM | POA: Diagnosis not present

## 2020-10-25 DIAGNOSIS — R932 Abnormal findings on diagnostic imaging of liver and biliary tract: Secondary | ICD-10-CM | POA: Diagnosis not present

## 2020-10-25 DIAGNOSIS — C9200 Acute myeloblastic leukemia, not having achieved remission: Secondary | ICD-10-CM | POA: Diagnosis not present

## 2020-10-25 DIAGNOSIS — R188 Other ascites: Secondary | ICD-10-CM | POA: Diagnosis not present

## 2020-10-28 DIAGNOSIS — K7460 Unspecified cirrhosis of liver: Secondary | ICD-10-CM | POA: Diagnosis not present

## 2020-10-28 DIAGNOSIS — D61818 Other pancytopenia: Secondary | ICD-10-CM | POA: Diagnosis not present

## 2020-10-28 DIAGNOSIS — R7989 Other specified abnormal findings of blood chemistry: Secondary | ICD-10-CM | POA: Diagnosis not present

## 2020-10-28 DIAGNOSIS — R600 Localized edema: Secondary | ICD-10-CM | POA: Diagnosis not present

## 2020-10-28 DIAGNOSIS — R197 Diarrhea, unspecified: Secondary | ICD-10-CM | POA: Diagnosis not present

## 2020-10-28 DIAGNOSIS — K8020 Calculus of gallbladder without cholecystitis without obstruction: Secondary | ICD-10-CM | POA: Diagnosis not present

## 2020-10-28 DIAGNOSIS — I272 Pulmonary hypertension, unspecified: Secondary | ICD-10-CM | POA: Diagnosis not present

## 2020-10-28 DIAGNOSIS — J90 Pleural effusion, not elsewhere classified: Secondary | ICD-10-CM | POA: Diagnosis not present

## 2020-10-28 DIAGNOSIS — I81 Portal vein thrombosis: Secondary | ICD-10-CM | POA: Diagnosis not present

## 2020-10-28 DIAGNOSIS — C9200 Acute myeloblastic leukemia, not having achieved remission: Secondary | ICD-10-CM | POA: Diagnosis not present

## 2020-10-28 DIAGNOSIS — M79672 Pain in left foot: Secondary | ICD-10-CM | POA: Diagnosis not present

## 2020-10-28 DIAGNOSIS — K259 Gastric ulcer, unspecified as acute or chronic, without hemorrhage or perforation: Secondary | ICD-10-CM | POA: Diagnosis not present

## 2020-10-31 DIAGNOSIS — C9200 Acute myeloblastic leukemia, not having achieved remission: Secondary | ICD-10-CM | POA: Diagnosis not present

## 2020-10-31 DIAGNOSIS — D45 Polycythemia vera: Secondary | ICD-10-CM | POA: Diagnosis not present

## 2020-11-04 DIAGNOSIS — C9200 Acute myeloblastic leukemia, not having achieved remission: Secondary | ICD-10-CM | POA: Diagnosis not present

## 2020-11-04 DIAGNOSIS — I81 Portal vein thrombosis: Secondary | ICD-10-CM | POA: Diagnosis not present

## 2020-11-04 DIAGNOSIS — K7460 Unspecified cirrhosis of liver: Secondary | ICD-10-CM | POA: Diagnosis not present

## 2020-11-04 DIAGNOSIS — J90 Pleural effusion, not elsewhere classified: Secondary | ICD-10-CM | POA: Diagnosis not present

## 2020-11-04 DIAGNOSIS — R7989 Other specified abnormal findings of blood chemistry: Secondary | ICD-10-CM | POA: Diagnosis not present

## 2020-11-04 DIAGNOSIS — K259 Gastric ulcer, unspecified as acute or chronic, without hemorrhage or perforation: Secondary | ICD-10-CM | POA: Diagnosis not present

## 2020-11-04 DIAGNOSIS — R600 Localized edema: Secondary | ICD-10-CM | POA: Diagnosis not present

## 2020-11-04 DIAGNOSIS — Z5111 Encounter for antineoplastic chemotherapy: Secondary | ICD-10-CM | POA: Diagnosis not present

## 2020-11-04 DIAGNOSIS — K8020 Calculus of gallbladder without cholecystitis without obstruction: Secondary | ICD-10-CM | POA: Diagnosis not present

## 2020-11-04 DIAGNOSIS — D696 Thrombocytopenia, unspecified: Secondary | ICD-10-CM | POA: Diagnosis not present

## 2020-11-04 DIAGNOSIS — I272 Pulmonary hypertension, unspecified: Secondary | ICD-10-CM | POA: Diagnosis not present

## 2020-11-05 DIAGNOSIS — Z5111 Encounter for antineoplastic chemotherapy: Secondary | ICD-10-CM | POA: Diagnosis not present

## 2020-11-05 DIAGNOSIS — Z79899 Other long term (current) drug therapy: Secondary | ICD-10-CM | POA: Diagnosis not present

## 2020-11-05 DIAGNOSIS — C9200 Acute myeloblastic leukemia, not having achieved remission: Secondary | ICD-10-CM | POA: Diagnosis not present

## 2020-11-06 DIAGNOSIS — Z5111 Encounter for antineoplastic chemotherapy: Secondary | ICD-10-CM | POA: Diagnosis not present

## 2020-11-06 DIAGNOSIS — Z79899 Other long term (current) drug therapy: Secondary | ICD-10-CM | POA: Diagnosis not present

## 2020-11-06 DIAGNOSIS — C9200 Acute myeloblastic leukemia, not having achieved remission: Secondary | ICD-10-CM | POA: Diagnosis not present

## 2020-11-07 DIAGNOSIS — Z5111 Encounter for antineoplastic chemotherapy: Secondary | ICD-10-CM | POA: Diagnosis not present

## 2020-11-07 DIAGNOSIS — C9200 Acute myeloblastic leukemia, not having achieved remission: Secondary | ICD-10-CM | POA: Diagnosis not present

## 2020-11-07 DIAGNOSIS — Z79899 Other long term (current) drug therapy: Secondary | ICD-10-CM | POA: Diagnosis not present

## 2020-11-08 DIAGNOSIS — Z79899 Other long term (current) drug therapy: Secondary | ICD-10-CM | POA: Diagnosis not present

## 2020-11-08 DIAGNOSIS — C9200 Acute myeloblastic leukemia, not having achieved remission: Secondary | ICD-10-CM | POA: Diagnosis not present

## 2020-11-08 DIAGNOSIS — Z5111 Encounter for antineoplastic chemotherapy: Secondary | ICD-10-CM | POA: Diagnosis not present

## 2020-11-11 DIAGNOSIS — C9200 Acute myeloblastic leukemia, not having achieved remission: Secondary | ICD-10-CM | POA: Diagnosis not present

## 2020-11-14 DIAGNOSIS — C9200 Acute myeloblastic leukemia, not having achieved remission: Secondary | ICD-10-CM | POA: Diagnosis not present

## 2020-11-15 ENCOUNTER — Other Ambulatory Visit (HOSPITAL_COMMUNITY): Payer: Self-pay

## 2020-11-18 DIAGNOSIS — D45 Polycythemia vera: Secondary | ICD-10-CM | POA: Diagnosis not present

## 2020-11-18 DIAGNOSIS — K7460 Unspecified cirrhosis of liver: Secondary | ICD-10-CM | POA: Diagnosis not present

## 2020-11-18 DIAGNOSIS — I748 Embolism and thrombosis of other arteries: Secondary | ICD-10-CM | POA: Diagnosis not present

## 2020-11-18 DIAGNOSIS — C9200 Acute myeloblastic leukemia, not having achieved remission: Secondary | ICD-10-CM | POA: Diagnosis not present

## 2020-11-18 DIAGNOSIS — D61818 Other pancytopenia: Secondary | ICD-10-CM | POA: Diagnosis not present

## 2020-11-18 DIAGNOSIS — I272 Pulmonary hypertension, unspecified: Secondary | ICD-10-CM | POA: Diagnosis not present

## 2020-11-18 DIAGNOSIS — D709 Neutropenia, unspecified: Secondary | ICD-10-CM | POA: Diagnosis not present

## 2020-11-18 DIAGNOSIS — Z79899 Other long term (current) drug therapy: Secondary | ICD-10-CM | POA: Diagnosis not present

## 2020-11-18 DIAGNOSIS — R600 Localized edema: Secondary | ICD-10-CM | POA: Diagnosis not present

## 2020-11-18 DIAGNOSIS — J90 Pleural effusion, not elsewhere classified: Secondary | ICD-10-CM | POA: Diagnosis not present

## 2020-11-21 DIAGNOSIS — C9200 Acute myeloblastic leukemia, not having achieved remission: Secondary | ICD-10-CM | POA: Diagnosis not present

## 2020-11-21 DIAGNOSIS — D45 Polycythemia vera: Secondary | ICD-10-CM | POA: Diagnosis not present

## 2020-11-25 DIAGNOSIS — C9200 Acute myeloblastic leukemia, not having achieved remission: Secondary | ICD-10-CM | POA: Diagnosis not present

## 2020-11-25 DIAGNOSIS — D709 Neutropenia, unspecified: Secondary | ICD-10-CM | POA: Diagnosis not present

## 2020-11-25 DIAGNOSIS — D696 Thrombocytopenia, unspecified: Secondary | ICD-10-CM | POA: Diagnosis not present

## 2020-11-25 DIAGNOSIS — D45 Polycythemia vera: Secondary | ICD-10-CM | POA: Diagnosis not present

## 2020-11-28 DIAGNOSIS — C9200 Acute myeloblastic leukemia, not having achieved remission: Secondary | ICD-10-CM | POA: Diagnosis not present

## 2020-11-28 DIAGNOSIS — D45 Polycythemia vera: Secondary | ICD-10-CM | POA: Diagnosis not present

## 2020-12-02 DIAGNOSIS — I272 Pulmonary hypertension, unspecified: Secondary | ICD-10-CM | POA: Diagnosis not present

## 2020-12-02 DIAGNOSIS — R109 Unspecified abdominal pain: Secondary | ICD-10-CM | POA: Diagnosis not present

## 2020-12-02 DIAGNOSIS — D61818 Other pancytopenia: Secondary | ICD-10-CM | POA: Diagnosis not present

## 2020-12-02 DIAGNOSIS — K259 Gastric ulcer, unspecified as acute or chronic, without hemorrhage or perforation: Secondary | ICD-10-CM | POA: Diagnosis not present

## 2020-12-02 DIAGNOSIS — K8020 Calculus of gallbladder without cholecystitis without obstruction: Secondary | ICD-10-CM | POA: Diagnosis not present

## 2020-12-02 DIAGNOSIS — R600 Localized edema: Secondary | ICD-10-CM | POA: Diagnosis not present

## 2020-12-02 DIAGNOSIS — C9200 Acute myeloblastic leukemia, not having achieved remission: Secondary | ICD-10-CM | POA: Diagnosis not present

## 2020-12-02 DIAGNOSIS — J90 Pleural effusion, not elsewhere classified: Secondary | ICD-10-CM | POA: Diagnosis not present

## 2020-12-02 DIAGNOSIS — K7460 Unspecified cirrhosis of liver: Secondary | ICD-10-CM | POA: Diagnosis not present

## 2020-12-02 DIAGNOSIS — D696 Thrombocytopenia, unspecified: Secondary | ICD-10-CM | POA: Diagnosis not present

## 2020-12-05 DIAGNOSIS — C9200 Acute myeloblastic leukemia, not having achieved remission: Secondary | ICD-10-CM | POA: Diagnosis not present

## 2020-12-09 DIAGNOSIS — D696 Thrombocytopenia, unspecified: Secondary | ICD-10-CM | POA: Diagnosis not present

## 2020-12-09 DIAGNOSIS — C9200 Acute myeloblastic leukemia, not having achieved remission: Secondary | ICD-10-CM | POA: Diagnosis not present

## 2020-12-12 DIAGNOSIS — D696 Thrombocytopenia, unspecified: Secondary | ICD-10-CM | POA: Diagnosis not present

## 2020-12-12 DIAGNOSIS — C9200 Acute myeloblastic leukemia, not having achieved remission: Secondary | ICD-10-CM | POA: Diagnosis not present

## 2020-12-16 DIAGNOSIS — Z7901 Long term (current) use of anticoagulants: Secondary | ICD-10-CM | POA: Diagnosis not present

## 2020-12-16 DIAGNOSIS — I272 Pulmonary hypertension, unspecified: Secondary | ICD-10-CM | POA: Diagnosis not present

## 2020-12-16 DIAGNOSIS — J90 Pleural effusion, not elsewhere classified: Secondary | ICD-10-CM | POA: Diagnosis not present

## 2020-12-16 DIAGNOSIS — C9201 Acute myeloblastic leukemia, in remission: Secondary | ICD-10-CM | POA: Diagnosis not present

## 2020-12-16 DIAGNOSIS — C9200 Acute myeloblastic leukemia, not having achieved remission: Secondary | ICD-10-CM | POA: Diagnosis not present

## 2020-12-16 DIAGNOSIS — Z86718 Personal history of other venous thrombosis and embolism: Secondary | ICD-10-CM | POA: Diagnosis not present

## 2020-12-16 DIAGNOSIS — K7460 Unspecified cirrhosis of liver: Secondary | ICD-10-CM | POA: Diagnosis not present

## 2020-12-16 DIAGNOSIS — Z87898 Personal history of other specified conditions: Secondary | ICD-10-CM | POA: Diagnosis not present

## 2020-12-16 DIAGNOSIS — R600 Localized edema: Secondary | ICD-10-CM | POA: Diagnosis not present

## 2020-12-16 DIAGNOSIS — K219 Gastro-esophageal reflux disease without esophagitis: Secondary | ICD-10-CM | POA: Diagnosis not present

## 2020-12-16 DIAGNOSIS — D61818 Other pancytopenia: Secondary | ICD-10-CM | POA: Diagnosis not present

## 2020-12-16 DIAGNOSIS — Z8719 Personal history of other diseases of the digestive system: Secondary | ICD-10-CM | POA: Diagnosis not present

## 2020-12-17 DIAGNOSIS — Z79899 Other long term (current) drug therapy: Secondary | ICD-10-CM | POA: Diagnosis not present

## 2020-12-17 DIAGNOSIS — Z5111 Encounter for antineoplastic chemotherapy: Secondary | ICD-10-CM | POA: Diagnosis not present

## 2020-12-17 DIAGNOSIS — C9200 Acute myeloblastic leukemia, not having achieved remission: Secondary | ICD-10-CM | POA: Diagnosis not present

## 2020-12-18 DIAGNOSIS — Z5111 Encounter for antineoplastic chemotherapy: Secondary | ICD-10-CM | POA: Diagnosis not present

## 2020-12-18 DIAGNOSIS — Z79899 Other long term (current) drug therapy: Secondary | ICD-10-CM | POA: Diagnosis not present

## 2020-12-18 DIAGNOSIS — C9200 Acute myeloblastic leukemia, not having achieved remission: Secondary | ICD-10-CM | POA: Diagnosis not present

## 2020-12-19 DIAGNOSIS — C9200 Acute myeloblastic leukemia, not having achieved remission: Secondary | ICD-10-CM | POA: Diagnosis not present

## 2020-12-19 DIAGNOSIS — Z79899 Other long term (current) drug therapy: Secondary | ICD-10-CM | POA: Diagnosis not present

## 2020-12-19 DIAGNOSIS — Z5111 Encounter for antineoplastic chemotherapy: Secondary | ICD-10-CM | POA: Diagnosis not present

## 2020-12-20 DIAGNOSIS — Z95828 Presence of other vascular implants and grafts: Secondary | ICD-10-CM | POA: Diagnosis not present

## 2020-12-20 DIAGNOSIS — Z5111 Encounter for antineoplastic chemotherapy: Secondary | ICD-10-CM | POA: Diagnosis not present

## 2020-12-20 DIAGNOSIS — C9200 Acute myeloblastic leukemia, not having achieved remission: Secondary | ICD-10-CM | POA: Diagnosis not present

## 2020-12-23 DIAGNOSIS — C9200 Acute myeloblastic leukemia, not having achieved remission: Secondary | ICD-10-CM | POA: Diagnosis not present

## 2020-12-23 DIAGNOSIS — D45 Polycythemia vera: Secondary | ICD-10-CM | POA: Diagnosis not present

## 2020-12-23 NOTE — Progress Notes (Signed)
HEMATOLOGY/ONCOLOGY CONSULTATION NOTE  Date of Service: 12/24/2020  Patient Care Team: Burnard Bunting, MD as PCP - General (Internal Medicine)  CHIEF COMPLAINTS/PURPOSE OF CONSULTATION:  AML Transfer of Care  HISTORY OF PRESENTING ILLNESS:   Benjamin [name] is a wonderful 70 y.o. 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 The pt reports that he is doing well overall.  The pt reports that he is currently being seen by Dr. Joan Mayans for AML and receiving Decitabine. 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 The pt is here today because he is currently on transfusion support and labs multiple times weekly, seeking for a closer place for all of this due to living very near to [facility]. The pt is currently on cycle 5, started 12/16/2020.  The pt notes that he was diagnosed with Polycythemia Vera in 2005 after a second blood clot within a year of the first clot. He was treated by Dr. Marin Olp for sixteen years with therapeutic phlebotomies. He was then sent to Dr. Hardin Negus who sent him to Dr. Joan Mayans, who is now controlling his  AML. The pt notes he went to [facility] in November and he was immediately put in the hospital. He was given Decitabine and just finished his fifth cycle. Since then, Dr. Joan Mayans has been treating the pt twice weekly (M/Th) with blood tests and transfusions if Hgb less than 8 and Plt less than 20K. The pt notes he can only tell his Hgb is low when it gets below 7. The pt notes that there have been a few instances where his Plt and Hgb have been steady and not needed transfusions support. The pt has been off the Hydroxyurea since starting treatment. The pt notes that Dr. Joan Mayans has him on 200 mg Venetoclax at this time, as he takes two pills daily. The pt notes that everything has been stable recently and he was told he was essentially in  remission. That is why the pt is here today to get the care transferred here due to location. The pt notes that he takes the Venetoclax 2 weeks on, 2 weeks off at this time to allow his body a chance to bounce back. The pt notes his transfusion needs have been gradually decreasing somewhat. The pt notes that he needs the transfusions pretty much every time he visits labs M and Th. He notes that recently on some Thursdays he has not needed any blood. The pt notes that his Hgb had dropped yesterday to the 6 levels from being stable around 8.  The pt notes that he was never told why he has liver cirrhosis or the extent to which this affects the count. He notes that his spleen used to be very enlarged with the PV, but has now since nearly normalized while on his current treatment. He notes no concerns regarding the liver cirrhosis, but gets checks every three months at this time. They have not found anything critical that needs management at this time. The pt notes he is not currently on the antimicrobial nor antifungal he was prescribed due to his counts being okay.  On review of systems, pt reports leg swelling and denies infection issues, fevers, chills, night sweats, and any other symptoms.  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 15% blasts in peripheral blood noted 07/11/20 so treated like AML  07/12/2020 - Chemotherapy  INPT / OP ONC LEUKEMIA / AML / MDS / VENETOCLAX / DECITABINE (5 or 10 Days with Jump Start Options) Plan Provider: Janyth Contes, MD Treatment goal: Control Line of treatment: [No plan line of  treatment]  07/12/2020 - Chemotherapy  C1 Decitabine (9m/m2) IV daily x 5 days plus Venetoclax 40109mPO once daily; Venetoclax HELD 11/29-12/08/21 secondary to elevated LFTs  Complications  - Spontaneous TLS, s/p Rasburicase - 07/22/20: Developed nausea, vomiting and diarrhea along with elevated LFTs; Venetoclax HELD and abdominal U/S locally 07/23/20 showed possible cirrhosis as well as cholelithiasis; to see GI on 08/07/20  08/12/2020 - Chemotherapy  C2 Decitabine 2027m2 IV daily x 4 days (due to Christmas Holiday) plus Venetoclax 400m87m once daily; Venetoclax HELD 09/05/20 secondary to diarrhea  09/05/2020 Biopsy  Repeat bone marrow biopsy variably cellular (<5-60%) with no increase in blasts (1%) but MRD positive in 2.1% of cells.  09/23/2020 - Chemotherapy  C3 Decitabine (20mg70m IV daily x 5 days plus Venetoclax 200mg 65muced secondary to poor tolerance and increased LFTs; reduced further to 100mg w19mon concomitant Posaconazole) PO once daily (#1 at 14-days)  11/04/2020 - Chemotherapy  C4 Dectaibine (20mg/m274m daily x 5 days plus Venetoclax 200mg (re68md secondary to poor tolerance and increased LFTs; reduced further to 100mg when58mconcomitant Posaconazole) PO once daily (#2 at 14-days)  12/16/2020 - Chemotherapy  C5 Dectaibine (20mg/m2) I61mily x 5 days plus Venetoclax 200mg (reduc50mecondary to poor tolerance and increased LFTs; reduced further to 100mg when on68mcomitant Posaconazole) PO once daily (#3 at 14-days)    MEDICAL HISTORY:  Past Medical History:  Diagnosis Date  . Acute myeloid leukemia ([name]) 11/29/20[place] . Anemia   . Clotting disorder ([name])   . [place]ps   . DVT (deep venous thrombosis) ([name])    hx -lt leg-dx polycythemia  . 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 Focal dystonia   . GERD (gastroesophageal reflux disease)   . Goals of care, counseling/discussion 04/04/2020  . 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 P. vera  ([name]) 12/26/20[place] . Perforated bowel ([name])   . PerfCoffman Coveted stomach ([name])    [place] Village[place]forated bowel  . Polycythemia   . Sepsis ([name])     [place]: Past Surgical History:  Procedure Laterality Date  . COLONOSCOPY    . COLONOSCOPY    . 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 FINGER ARTHROPLASTY  1998   rt index  . HERNIA REPAIR    . INGUINAL HERNIA REPAIR  2007   left  . INGUINAL HERNIA REPAIR  2009   right  . LAPAROTOMY N/A 12/15/2015   Procedure: EXPLORATORY LAPAROTOMY;  Surgeon: Alicia ThomasLeighton Ruffon: [facility];  Service: General;  Laterality: N/A;  . WISDOM TOOTH EXTRACTION      SOCIAL HISTORY: Social History   Socioeconomic History  . Marital status: Married    Spouse name: Not on file  . Number of children:  Not on file  . Years of education: Not on file  . Highest education level: Not on file  Occupational History  . Not on file  Tobacco Use  . Smoking status: Never Smoker  . Smokeless tobacco: Never Used  . Tobacco comment: never used tobacco  Vaping Use  . Vaping Use: Never used  Substance and Sexual Activity  . Alcohol use: Yes    Alcohol/week: 0.0 standard drinks    Comment: almost daily wine  . Drug use: No  . Sexual activity: Not on file  Other Topics Concern  . Not on file  Social History Narrative  . Not on file   Social Determinants of Health   Financial Resource Strain: Not on file  Food Insecurity: Not on file  Transportation Needs: Not on file  Physical Activity: Not on file  Stress: Not on file  Social Connections: Not on file  Intimate Partner Violence: Not on file    FAMILY HISTORY: Family History  Problem Relation Age of Onset  . Heart disease Father   . Hyperlipidemia Father   . Hypertension Father   . Diabetes Father   . Diabetes Brother    . Hyperlipidemia Brother   . Prostate cancer Brother        oldest brother  . Colon cancer Neg Hx     ALLERGIES:  is allergic to aleve [naproxen sodium], biaxin [clarithromycin], cephalexin, flagyl [metronidazole], and morphine and related.  MEDICATIONS:  Current Outpatient Medications  Medication Sig Dispense Refill  . allopurinol (ZYLOPRIM) 300 MG tablet Take 300 mg by mouth daily.    . Cholecalciferol (VITAMIN D) 2000 units CAPS Take by mouth.    . Magnesium 400 MG CAPS Take 1 capsule by mouth daily.  (Patient not taking: Reported on 12/24/2020)    . testosterone (ANDRODERM) 4 MG/24HR PT24 patch Place 1 patch onto the skin daily. 30 patch 2   No current facility-administered medications for this visit.   Pt Medication List as per 04/25 note from [facility]   . calcium polycarbophiL (FIBERCON) 625 mg tablet Take 1 tablet (625 mg total) by mouth daily. (Patient taking differently: Take 625 mg by mouth every other day.) 90 tablet 3  . cholecalciferol (VITAMIN D3) 1000 UNIT Tab Take 2,000 Units by mouth daily.  . folic acid (FOLVITE) 300 MCG tablet Take 400 mcg by mouth daily.  . ondansetron (ZOFRAN) 8 MG tablet Take 1 tablet (8 mg total) by mouth every 8 (eight) hours as needed for Nausea. 30 tablet 1  . acyclovir (ZOVIRAX) 400 MG tablet Take 1 tablet (400 mg total) by mouth 2 times daily. While neutropenic 60 tablet 3  . allopurinoL (ZYLOPRIM) 300 MG tablet Take 1 tablet (300 mg total) by mouth daily. 30 tablet 5  . levoFLOXacin (LEVAQUIN) 500 mg tablet Take 1 tablet (500 mg total) by mouth daily. While neutropenic 30 tablet 3  . posaconazole (NOXAFIL) 100 mg DR tablet Take 3 tablets (300 mg total) by mouth daily. When neutropenic 90 tablet 3  . prochlorperazine (COMPAZINE) 10 MG tablet Take 1 tablet (10 mg total) by mouth every 6 (six) hours as needed for Nausea. 30 tablet 1  . sildenafil (VIAGRA) 25 MG tablet Take 2-5 tablets as needed for sexual activity  . venetoclax (VENCLEXTA)  100 mg tablet Take 4 tablets (400 mg total) by mouth daily. Or as directed by your oncologist. (Patient taking differently: Take 200 mg by mouth daily. Or as directed by your oncologist.) 120 tablet 11  REVIEW OF SYSTEMS:    10 Point review of Systems was done is negative except as noted above.  PHYSICAL EXAMINATION: ECOG PERFORMANCE STATUS: 2 - Symptomatic, <50% confined to bed  . Vitals:   12/24/20 1315  BP: (!) 145/65  Pulse: 74  Resp: 18  Temp: (!) 96.8 F (36 C)  SpO2: 100%   Filed Weights   12/24/20 1315  Weight: 154 lb 12.8 oz (70.2 kg)   .Body mass index is 20.99 kg/m.  GENERAL:alert, in no acute distress and comfortable SKIN: no acute rashes, no significant lesions EYES: conjunctiva are pink and non-injected, sclera anicteric OROPHARYNX: MMM, no exudates, no oropharyngeal erythema or ulceration NECK: supple, no JVD LYMPH:  no palpable lymphadenopathy in the cervical, axillary or inguinal regions LUNGS: clear to auscultation b/l with normal respiratory effort HEART: regular rate & rhythm ABDOMEN:  normoactive bowel sounds , non tender, not distended. Extremity: 2+ pedal edema b/l PSYCH: alert & oriented x 3 with fluent speech NEURO: no focal motor/sensory deficits  LABORATORY DATA:  I have reviewed the data as listed  . CBC Latest Ref Rng & Units 08/01/2020 07/29/2020 07/25/2020  WBC 4.0 - 10.5 K/uL 1.5(L) 2.3(L) 2.4(L)  Hemoglobin 13.0 - 17.0 g/dL 7.9(L) 6.9(LL) 7.7(L)  Hematocrit 39.0 - 52.0 % 24.6(L) 22.0(L) 24.2(L)  Platelets 150 - 400 K/uL 39(L) 32(L) 16(L)    . CMP Latest Ref Rng & Units 08/01/2020 07/29/2020 07/25/2020  Glucose 70 - 99 mg/dL 133(H) 120(H) 89  BUN 8 - 23 mg/dL 23 24(H) 24(H)  Creatinine 0.61 - 1.24 mg/dL 0.80 0.73 0.70  Sodium 135 - 145 mmol/L 132(L) 130(L) 134(L)  Potassium 3.5 - 5.1 mmol/L 4.0 4.0 4.3  Chloride 98 - 111 mmol/L 101 99 103  CO2 22 - 32 mmol/L '23 25 26  ' Calcium 8.9 - 10.3 mg/dL 8.5(L) 8.7(L) 8.7(L)  Total  Protein 6.5 - 8.1 g/dL 5.7(L) 5.6(L) 5.6(L)  Total Bilirubin 0.3 - 1.2 mg/dL 1.6(H) 1.9(H) 1.6(H)  Alkaline Phos 38 - 126 U/L 347(H) 328(H) 397(H)  AST 15 - 41 U/L 17 16 48(H)  ALT 0 - 44 U/L 33 57(H) 188(H)     RADIOGRAPHIC STUDIES: I have personally reviewed the radiological images as listed and agreed with the findings in the report. No results found.  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 The risk of this is 1-2% each year. -Discussed acute leukemias and the need for an acute leukemia service. Discussed the need for enhanced bandwidth and transfusion support on the spot.  -Advised pt that if this was de novo AML, then this would be a different situtation. But, the pt had Jak2 positive mutation and myelofibrosis.  -Discussed goals of care. Advised pt that if he desires aggressive care and potential clinical trials, this is best served in an academic center with an acute leukemia service. -Advised pt that if there is stability in his counts and not needing many transfusions, then he can certainly come back here and discuss a potential transfer of care again. We just cannot handle on the spot transfusion needs and the bandwidth for acute leukemia care here. -Discussed limiting factor being the need of transfusion support. The current treatment the pt is receiving is not an issue at all to be handled here. -Advised pt that neutrophils above 0.5K do not typically need the  medications, which is why he was told to stop it. -Recommended pt wear compression socks when not sleeping to reduce leg swelling.    FOLLOW UP: RTC with Dr Joan Mayans @ [facility]   All of the patients questions were answered with apparent satisfaction. The patient knows to call the clinic with any problems, questions or  concerns.  I spent 40 minutes counseling the patient face to face. The total time spent in the appointment was 60 minutes and more than 50% was on counseling and direct patient cares.    Sullivan Lone MD [place] AAHIVMS [facility] Hematology/Oncology Physician [facility]  (Office):       [phone] (Work cell):  [phone] (Fax):           [phone]  12/24/2020 2:04 PM  I, Reinaldo Raddle, am acting as scribe for Dr. Sullivan Lone, MD.  .I have reviewed the above documentation for accuracy and completeness, and I agree with the above. Brunetta Genera MD

## 2020-12-24 ENCOUNTER — Other Ambulatory Visit: Payer: Self-pay

## 2020-12-24 ENCOUNTER — Inpatient Hospital Stay: Payer: Medicare PPO | Attending: Hematology | Admitting: Hematology

## 2020-12-24 VITALS — BP 145/65 | HR 74 | Temp 96.8°F | Resp 18 | Wt 154.8 lb

## 2020-12-24 DIAGNOSIS — D45 Polycythemia vera: Secondary | ICD-10-CM | POA: Insufficient documentation

## 2020-12-24 DIAGNOSIS — C9200 Acute myeloblastic leukemia, not having achieved remission: Secondary | ICD-10-CM | POA: Insufficient documentation

## 2020-12-24 DIAGNOSIS — Z7189 Other specified counseling: Secondary | ICD-10-CM | POA: Diagnosis not present

## 2020-12-26 DIAGNOSIS — D45 Polycythemia vera: Secondary | ICD-10-CM | POA: Diagnosis not present

## 2020-12-26 DIAGNOSIS — C9200 Acute myeloblastic leukemia, not having achieved remission: Secondary | ICD-10-CM | POA: Diagnosis not present

## 2020-12-30 DIAGNOSIS — I270 Primary pulmonary hypertension: Secondary | ICD-10-CM | POA: Diagnosis not present

## 2020-12-30 DIAGNOSIS — R188 Other ascites: Secondary | ICD-10-CM | POA: Diagnosis not present

## 2020-12-30 DIAGNOSIS — J90 Pleural effusion, not elsewhere classified: Secondary | ICD-10-CM | POA: Diagnosis not present

## 2020-12-30 DIAGNOSIS — I820 Budd-Chiari syndrome: Secondary | ICD-10-CM | POA: Diagnosis not present

## 2020-12-30 DIAGNOSIS — K7469 Other cirrhosis of liver: Secondary | ICD-10-CM | POA: Diagnosis not present

## 2020-12-30 DIAGNOSIS — D696 Thrombocytopenia, unspecified: Secondary | ICD-10-CM | POA: Diagnosis not present

## 2020-12-30 DIAGNOSIS — J918 Pleural effusion in other conditions classified elsewhere: Secondary | ICD-10-CM | POA: Diagnosis not present

## 2020-12-30 DIAGNOSIS — I272 Pulmonary hypertension, unspecified: Secondary | ICD-10-CM | POA: Diagnosis not present

## 2020-12-30 DIAGNOSIS — K766 Portal hypertension: Secondary | ICD-10-CM | POA: Diagnosis not present

## 2020-12-30 DIAGNOSIS — D709 Neutropenia, unspecified: Secondary | ICD-10-CM | POA: Diagnosis not present

## 2020-12-30 DIAGNOSIS — D45 Polycythemia vera: Secondary | ICD-10-CM | POA: Diagnosis not present

## 2020-12-30 DIAGNOSIS — R600 Localized edema: Secondary | ICD-10-CM | POA: Diagnosis not present

## 2020-12-30 DIAGNOSIS — C9200 Acute myeloblastic leukemia, not having achieved remission: Secondary | ICD-10-CM | POA: Diagnosis not present

## 2020-12-30 DIAGNOSIS — Z86718 Personal history of other venous thrombosis and embolism: Secondary | ICD-10-CM | POA: Diagnosis not present

## 2021-01-02 DIAGNOSIS — D45 Polycythemia vera: Secondary | ICD-10-CM | POA: Diagnosis not present

## 2021-01-02 DIAGNOSIS — C9200 Acute myeloblastic leukemia, not having achieved remission: Secondary | ICD-10-CM | POA: Diagnosis not present

## 2021-01-06 DIAGNOSIS — C9200 Acute myeloblastic leukemia, not having achieved remission: Secondary | ICD-10-CM | POA: Diagnosis not present

## 2021-01-06 DIAGNOSIS — D45 Polycythemia vera: Secondary | ICD-10-CM | POA: Diagnosis not present

## 2021-01-09 DIAGNOSIS — C9201 Acute myeloblastic leukemia, in remission: Secondary | ICD-10-CM | POA: Diagnosis not present

## 2021-01-13 DIAGNOSIS — D709 Neutropenia, unspecified: Secondary | ICD-10-CM | POA: Diagnosis not present

## 2021-01-13 DIAGNOSIS — C9200 Acute myeloblastic leukemia, not having achieved remission: Secondary | ICD-10-CM | POA: Diagnosis not present

## 2021-01-13 DIAGNOSIS — D696 Thrombocytopenia, unspecified: Secondary | ICD-10-CM | POA: Diagnosis not present

## 2021-01-13 DIAGNOSIS — I272 Pulmonary hypertension, unspecified: Secondary | ICD-10-CM | POA: Diagnosis not present

## 2021-01-13 DIAGNOSIS — K7460 Unspecified cirrhosis of liver: Secondary | ICD-10-CM | POA: Diagnosis not present

## 2021-01-13 DIAGNOSIS — D630 Anemia in neoplastic disease: Secondary | ICD-10-CM | POA: Diagnosis not present

## 2021-01-13 DIAGNOSIS — R600 Localized edema: Secondary | ICD-10-CM | POA: Diagnosis not present

## 2021-01-13 DIAGNOSIS — D45 Polycythemia vera: Secondary | ICD-10-CM | POA: Diagnosis not present

## 2021-01-13 DIAGNOSIS — J90 Pleural effusion, not elsewhere classified: Secondary | ICD-10-CM | POA: Diagnosis not present

## 2021-01-21 DIAGNOSIS — D45 Polycythemia vera: Secondary | ICD-10-CM | POA: Diagnosis not present

## 2021-01-21 DIAGNOSIS — C9200 Acute myeloblastic leukemia, not having achieved remission: Secondary | ICD-10-CM | POA: Diagnosis not present

## 2021-01-24 DIAGNOSIS — K7460 Unspecified cirrhosis of liver: Secondary | ICD-10-CM | POA: Diagnosis not present

## 2021-01-24 DIAGNOSIS — C9200 Acute myeloblastic leukemia, not having achieved remission: Secondary | ICD-10-CM | POA: Diagnosis not present

## 2021-01-27 DIAGNOSIS — C9200 Acute myeloblastic leukemia, not having achieved remission: Secondary | ICD-10-CM | POA: Diagnosis not present

## 2021-01-27 DIAGNOSIS — J90 Pleural effusion, not elsewhere classified: Secondary | ICD-10-CM | POA: Diagnosis not present

## 2021-01-27 DIAGNOSIS — R600 Localized edema: Secondary | ICD-10-CM | POA: Diagnosis not present

## 2021-01-27 DIAGNOSIS — K7469 Other cirrhosis of liver: Secondary | ICD-10-CM | POA: Diagnosis not present

## 2021-01-27 DIAGNOSIS — Z86718 Personal history of other venous thrombosis and embolism: Secondary | ICD-10-CM | POA: Diagnosis not present

## 2021-01-27 DIAGNOSIS — Z9889 Other specified postprocedural states: Secondary | ICD-10-CM | POA: Diagnosis not present

## 2021-01-27 DIAGNOSIS — I81 Portal vein thrombosis: Secondary | ICD-10-CM | POA: Diagnosis not present

## 2021-01-27 DIAGNOSIS — Z95828 Presence of other vascular implants and grafts: Secondary | ICD-10-CM | POA: Diagnosis not present

## 2021-01-27 DIAGNOSIS — D696 Thrombocytopenia, unspecified: Secondary | ICD-10-CM | POA: Diagnosis not present

## 2021-01-27 DIAGNOSIS — Z5111 Encounter for antineoplastic chemotherapy: Secondary | ICD-10-CM | POA: Diagnosis not present

## 2021-01-27 DIAGNOSIS — D709 Neutropenia, unspecified: Secondary | ICD-10-CM | POA: Diagnosis not present

## 2021-01-28 DIAGNOSIS — C9200 Acute myeloblastic leukemia, not having achieved remission: Secondary | ICD-10-CM | POA: Diagnosis not present

## 2021-01-28 DIAGNOSIS — Z95828 Presence of other vascular implants and grafts: Secondary | ICD-10-CM | POA: Diagnosis not present

## 2021-01-28 DIAGNOSIS — Z5111 Encounter for antineoplastic chemotherapy: Secondary | ICD-10-CM | POA: Diagnosis not present

## 2021-01-29 DIAGNOSIS — Z5111 Encounter for antineoplastic chemotherapy: Secondary | ICD-10-CM | POA: Diagnosis not present

## 2021-01-29 DIAGNOSIS — Z95828 Presence of other vascular implants and grafts: Secondary | ICD-10-CM | POA: Diagnosis not present

## 2021-01-29 DIAGNOSIS — C9200 Acute myeloblastic leukemia, not having achieved remission: Secondary | ICD-10-CM | POA: Diagnosis not present

## 2021-01-30 DIAGNOSIS — Z95828 Presence of other vascular implants and grafts: Secondary | ICD-10-CM | POA: Diagnosis not present

## 2021-01-30 DIAGNOSIS — D45 Polycythemia vera: Secondary | ICD-10-CM | POA: Diagnosis not present

## 2021-01-30 DIAGNOSIS — Z5111 Encounter for antineoplastic chemotherapy: Secondary | ICD-10-CM | POA: Diagnosis not present

## 2021-01-30 DIAGNOSIS — C9200 Acute myeloblastic leukemia, not having achieved remission: Secondary | ICD-10-CM | POA: Diagnosis not present

## 2021-01-31 DIAGNOSIS — Z95828 Presence of other vascular implants and grafts: Secondary | ICD-10-CM | POA: Diagnosis not present

## 2021-01-31 DIAGNOSIS — C9200 Acute myeloblastic leukemia, not having achieved remission: Secondary | ICD-10-CM | POA: Diagnosis not present

## 2021-01-31 DIAGNOSIS — Z5111 Encounter for antineoplastic chemotherapy: Secondary | ICD-10-CM | POA: Diagnosis not present

## 2021-02-03 DIAGNOSIS — C9200 Acute myeloblastic leukemia, not having achieved remission: Secondary | ICD-10-CM | POA: Diagnosis not present

## 2021-02-03 DIAGNOSIS — Z95828 Presence of other vascular implants and grafts: Secondary | ICD-10-CM | POA: Diagnosis not present

## 2021-02-03 DIAGNOSIS — D45 Polycythemia vera: Secondary | ICD-10-CM | POA: Diagnosis not present

## 2021-02-06 DIAGNOSIS — D45 Polycythemia vera: Secondary | ICD-10-CM | POA: Diagnosis not present

## 2021-02-06 DIAGNOSIS — Z95828 Presence of other vascular implants and grafts: Secondary | ICD-10-CM | POA: Diagnosis not present

## 2021-02-06 DIAGNOSIS — C9200 Acute myeloblastic leukemia, not having achieved remission: Secondary | ICD-10-CM | POA: Diagnosis not present

## 2021-02-10 DIAGNOSIS — C9200 Acute myeloblastic leukemia, not having achieved remission: Secondary | ICD-10-CM | POA: Diagnosis not present

## 2021-02-10 DIAGNOSIS — D45 Polycythemia vera: Secondary | ICD-10-CM | POA: Diagnosis not present

## 2021-02-10 DIAGNOSIS — Z95828 Presence of other vascular implants and grafts: Secondary | ICD-10-CM | POA: Diagnosis not present

## 2021-02-11 DIAGNOSIS — K7460 Unspecified cirrhosis of liver: Secondary | ICD-10-CM | POA: Diagnosis not present

## 2021-02-11 DIAGNOSIS — R188 Other ascites: Secondary | ICD-10-CM | POA: Diagnosis not present

## 2021-02-11 DIAGNOSIS — R162 Hepatomegaly with splenomegaly, not elsewhere classified: Secondary | ICD-10-CM | POA: Diagnosis not present

## 2021-02-13 DIAGNOSIS — Z95828 Presence of other vascular implants and grafts: Secondary | ICD-10-CM | POA: Diagnosis not present

## 2021-02-13 DIAGNOSIS — D45 Polycythemia vera: Secondary | ICD-10-CM | POA: Diagnosis not present

## 2021-02-13 DIAGNOSIS — C9201 Acute myeloblastic leukemia, in remission: Secondary | ICD-10-CM | POA: Diagnosis not present

## 2021-02-17 DIAGNOSIS — D45 Polycythemia vera: Secondary | ICD-10-CM | POA: Diagnosis not present

## 2021-02-17 DIAGNOSIS — I272 Pulmonary hypertension, unspecified: Secondary | ICD-10-CM | POA: Diagnosis not present

## 2021-02-17 DIAGNOSIS — J90 Pleural effusion, not elsewhere classified: Secondary | ICD-10-CM | POA: Diagnosis not present

## 2021-02-17 DIAGNOSIS — C9200 Acute myeloblastic leukemia, not having achieved remission: Secondary | ICD-10-CM | POA: Diagnosis not present

## 2021-02-17 DIAGNOSIS — D61818 Other pancytopenia: Secondary | ICD-10-CM | POA: Diagnosis not present

## 2021-02-17 DIAGNOSIS — L509 Urticaria, unspecified: Secondary | ICD-10-CM | POA: Diagnosis not present

## 2021-02-17 DIAGNOSIS — Z95828 Presence of other vascular implants and grafts: Secondary | ICD-10-CM | POA: Diagnosis not present

## 2021-02-17 DIAGNOSIS — K7460 Unspecified cirrhosis of liver: Secondary | ICD-10-CM | POA: Diagnosis not present

## 2021-02-17 DIAGNOSIS — T82898D Other specified complication of vascular prosthetic devices, implants and grafts, subsequent encounter: Secondary | ICD-10-CM | POA: Diagnosis not present

## 2021-02-20 DIAGNOSIS — C9201 Acute myeloblastic leukemia, in remission: Secondary | ICD-10-CM | POA: Diagnosis not present

## 2021-02-25 DIAGNOSIS — C9201 Acute myeloblastic leukemia, in remission: Secondary | ICD-10-CM | POA: Diagnosis not present

## 2021-02-28 DIAGNOSIS — C9201 Acute myeloblastic leukemia, in remission: Secondary | ICD-10-CM | POA: Diagnosis not present

## 2021-03-03 DIAGNOSIS — C9201 Acute myeloblastic leukemia, in remission: Secondary | ICD-10-CM | POA: Diagnosis not present

## 2021-03-06 DIAGNOSIS — R600 Localized edema: Secondary | ICD-10-CM | POA: Diagnosis not present

## 2021-03-06 DIAGNOSIS — C9200 Acute myeloblastic leukemia, not having achieved remission: Secondary | ICD-10-CM | POA: Diagnosis not present

## 2021-03-10 DIAGNOSIS — R945 Abnormal results of liver function studies: Secondary | ICD-10-CM | POA: Diagnosis not present

## 2021-03-10 DIAGNOSIS — C9200 Acute myeloblastic leukemia, not having achieved remission: Secondary | ICD-10-CM | POA: Diagnosis not present

## 2021-03-10 DIAGNOSIS — J90 Pleural effusion, not elsewhere classified: Secondary | ICD-10-CM | POA: Diagnosis not present

## 2021-03-10 DIAGNOSIS — Z86718 Personal history of other venous thrombosis and embolism: Secondary | ICD-10-CM | POA: Diagnosis not present

## 2021-03-10 DIAGNOSIS — C9202 Acute myeloblastic leukemia, in relapse: Secondary | ICD-10-CM | POA: Diagnosis not present

## 2021-03-10 DIAGNOSIS — I272 Pulmonary hypertension, unspecified: Secondary | ICD-10-CM | POA: Diagnosis not present

## 2021-03-10 DIAGNOSIS — Z79899 Other long term (current) drug therapy: Secondary | ICD-10-CM | POA: Diagnosis not present

## 2021-03-10 DIAGNOSIS — L509 Urticaria, unspecified: Secondary | ICD-10-CM | POA: Diagnosis not present

## 2021-03-10 DIAGNOSIS — D61818 Other pancytopenia: Secondary | ICD-10-CM | POA: Diagnosis not present

## 2021-03-10 DIAGNOSIS — K7460 Unspecified cirrhosis of liver: Secondary | ICD-10-CM | POA: Diagnosis not present

## 2021-03-11 DIAGNOSIS — Z5111 Encounter for antineoplastic chemotherapy: Secondary | ICD-10-CM | POA: Diagnosis not present

## 2021-03-11 DIAGNOSIS — Z95828 Presence of other vascular implants and grafts: Secondary | ICD-10-CM | POA: Diagnosis not present

## 2021-03-11 DIAGNOSIS — C9200 Acute myeloblastic leukemia, not having achieved remission: Secondary | ICD-10-CM | POA: Diagnosis not present

## 2021-03-12 DIAGNOSIS — C9200 Acute myeloblastic leukemia, not having achieved remission: Secondary | ICD-10-CM | POA: Diagnosis not present

## 2021-03-12 DIAGNOSIS — Z95828 Presence of other vascular implants and grafts: Secondary | ICD-10-CM | POA: Diagnosis not present

## 2021-03-12 DIAGNOSIS — Z5111 Encounter for antineoplastic chemotherapy: Secondary | ICD-10-CM | POA: Diagnosis not present

## 2021-03-13 DIAGNOSIS — D45 Polycythemia vera: Secondary | ICD-10-CM | POA: Diagnosis not present

## 2021-03-13 DIAGNOSIS — Z5111 Encounter for antineoplastic chemotherapy: Secondary | ICD-10-CM | POA: Diagnosis not present

## 2021-03-13 DIAGNOSIS — Z95828 Presence of other vascular implants and grafts: Secondary | ICD-10-CM | POA: Diagnosis not present

## 2021-03-13 DIAGNOSIS — D61818 Other pancytopenia: Secondary | ICD-10-CM | POA: Diagnosis not present

## 2021-03-13 DIAGNOSIS — C9200 Acute myeloblastic leukemia, not having achieved remission: Secondary | ICD-10-CM | POA: Diagnosis not present

## 2021-03-14 DIAGNOSIS — Z5111 Encounter for antineoplastic chemotherapy: Secondary | ICD-10-CM | POA: Diagnosis not present

## 2021-03-14 DIAGNOSIS — C9200 Acute myeloblastic leukemia, not having achieved remission: Secondary | ICD-10-CM | POA: Diagnosis not present

## 2021-03-14 DIAGNOSIS — Z95828 Presence of other vascular implants and grafts: Secondary | ICD-10-CM | POA: Diagnosis not present

## 2021-03-17 DIAGNOSIS — D45 Polycythemia vera: Secondary | ICD-10-CM | POA: Diagnosis not present

## 2021-03-17 DIAGNOSIS — Z95828 Presence of other vascular implants and grafts: Secondary | ICD-10-CM | POA: Diagnosis not present

## 2021-03-17 DIAGNOSIS — C9200 Acute myeloblastic leukemia, not having achieved remission: Secondary | ICD-10-CM | POA: Diagnosis not present

## 2021-03-20 DIAGNOSIS — D45 Polycythemia vera: Secondary | ICD-10-CM | POA: Diagnosis not present

## 2021-03-20 DIAGNOSIS — C9201 Acute myeloblastic leukemia, in remission: Secondary | ICD-10-CM | POA: Diagnosis not present

## 2021-03-24 DIAGNOSIS — D45 Polycythemia vera: Secondary | ICD-10-CM | POA: Diagnosis not present

## 2021-03-24 DIAGNOSIS — C9201 Acute myeloblastic leukemia, in remission: Secondary | ICD-10-CM | POA: Diagnosis not present

## 2021-03-27 DIAGNOSIS — Z95828 Presence of other vascular implants and grafts: Secondary | ICD-10-CM | POA: Diagnosis not present

## 2021-03-27 DIAGNOSIS — C9200 Acute myeloblastic leukemia, not having achieved remission: Secondary | ICD-10-CM | POA: Diagnosis not present

## 2021-03-27 DIAGNOSIS — D45 Polycythemia vera: Secondary | ICD-10-CM | POA: Diagnosis not present

## 2021-03-31 DIAGNOSIS — Z95828 Presence of other vascular implants and grafts: Secondary | ICD-10-CM | POA: Diagnosis not present

## 2021-03-31 DIAGNOSIS — C9200 Acute myeloblastic leukemia, not having achieved remission: Secondary | ICD-10-CM | POA: Diagnosis not present

## 2021-03-31 DIAGNOSIS — D45 Polycythemia vera: Secondary | ICD-10-CM | POA: Diagnosis not present

## 2021-04-03 DIAGNOSIS — Z95828 Presence of other vascular implants and grafts: Secondary | ICD-10-CM | POA: Diagnosis not present

## 2021-04-03 DIAGNOSIS — D45 Polycythemia vera: Secondary | ICD-10-CM | POA: Diagnosis not present

## 2021-04-03 DIAGNOSIS — C9200 Acute myeloblastic leukemia, not having achieved remission: Secondary | ICD-10-CM | POA: Diagnosis not present

## 2021-04-07 DIAGNOSIS — C9201 Acute myeloblastic leukemia, in remission: Secondary | ICD-10-CM | POA: Diagnosis not present

## 2021-04-10 DIAGNOSIS — C9201 Acute myeloblastic leukemia, in remission: Secondary | ICD-10-CM | POA: Diagnosis not present

## 2021-04-14 DIAGNOSIS — D45 Polycythemia vera: Secondary | ICD-10-CM | POA: Diagnosis not present

## 2021-04-14 DIAGNOSIS — Z95828 Presence of other vascular implants and grafts: Secondary | ICD-10-CM | POA: Diagnosis not present

## 2021-04-14 DIAGNOSIS — C9200 Acute myeloblastic leukemia, not having achieved remission: Secondary | ICD-10-CM | POA: Diagnosis not present

## 2021-04-17 DIAGNOSIS — Z95828 Presence of other vascular implants and grafts: Secondary | ICD-10-CM | POA: Diagnosis not present

## 2021-04-17 DIAGNOSIS — C9200 Acute myeloblastic leukemia, not having achieved remission: Secondary | ICD-10-CM | POA: Diagnosis not present

## 2021-04-17 DIAGNOSIS — D45 Polycythemia vera: Secondary | ICD-10-CM | POA: Diagnosis not present

## 2021-04-21 DIAGNOSIS — K7460 Unspecified cirrhosis of liver: Secondary | ICD-10-CM | POA: Diagnosis not present

## 2021-04-21 DIAGNOSIS — Z95828 Presence of other vascular implants and grafts: Secondary | ICD-10-CM | POA: Diagnosis not present

## 2021-04-21 DIAGNOSIS — R600 Localized edema: Secondary | ICD-10-CM | POA: Diagnosis not present

## 2021-04-21 DIAGNOSIS — Z8739 Personal history of other diseases of the musculoskeletal system and connective tissue: Secondary | ICD-10-CM | POA: Diagnosis not present

## 2021-04-21 DIAGNOSIS — R945 Abnormal results of liver function studies: Secondary | ICD-10-CM | POA: Diagnosis not present

## 2021-04-21 DIAGNOSIS — Z9889 Other specified postprocedural states: Secondary | ICD-10-CM | POA: Diagnosis not present

## 2021-04-21 DIAGNOSIS — D61818 Other pancytopenia: Secondary | ICD-10-CM | POA: Diagnosis not present

## 2021-04-21 DIAGNOSIS — Z86718 Personal history of other venous thrombosis and embolism: Secondary | ICD-10-CM | POA: Diagnosis not present

## 2021-04-21 DIAGNOSIS — K766 Portal hypertension: Secondary | ICD-10-CM | POA: Diagnosis not present

## 2021-04-21 DIAGNOSIS — C9202 Acute myeloblastic leukemia, in relapse: Secondary | ICD-10-CM | POA: Diagnosis not present

## 2021-04-21 DIAGNOSIS — Z5112 Encounter for antineoplastic immunotherapy: Secondary | ICD-10-CM | POA: Diagnosis not present

## 2021-04-21 DIAGNOSIS — C9200 Acute myeloblastic leukemia, not having achieved remission: Secondary | ICD-10-CM | POA: Diagnosis not present

## 2021-04-22 DIAGNOSIS — C9200 Acute myeloblastic leukemia, not having achieved remission: Secondary | ICD-10-CM | POA: Diagnosis not present

## 2021-04-22 DIAGNOSIS — Z5111 Encounter for antineoplastic chemotherapy: Secondary | ICD-10-CM | POA: Diagnosis not present

## 2021-04-22 DIAGNOSIS — Z95828 Presence of other vascular implants and grafts: Secondary | ICD-10-CM | POA: Diagnosis not present

## 2021-04-23 DIAGNOSIS — C9200 Acute myeloblastic leukemia, not having achieved remission: Secondary | ICD-10-CM | POA: Diagnosis not present

## 2021-04-23 DIAGNOSIS — Z5111 Encounter for antineoplastic chemotherapy: Secondary | ICD-10-CM | POA: Diagnosis not present

## 2021-04-23 DIAGNOSIS — Z95828 Presence of other vascular implants and grafts: Secondary | ICD-10-CM | POA: Diagnosis not present

## 2021-04-24 DIAGNOSIS — C9200 Acute myeloblastic leukemia, not having achieved remission: Secondary | ICD-10-CM | POA: Diagnosis not present

## 2021-04-24 DIAGNOSIS — Z5111 Encounter for antineoplastic chemotherapy: Secondary | ICD-10-CM | POA: Diagnosis not present

## 2021-04-24 DIAGNOSIS — D45 Polycythemia vera: Secondary | ICD-10-CM | POA: Diagnosis not present

## 2021-04-24 DIAGNOSIS — Z95828 Presence of other vascular implants and grafts: Secondary | ICD-10-CM | POA: Diagnosis not present

## 2021-04-25 DIAGNOSIS — Z5111 Encounter for antineoplastic chemotherapy: Secondary | ICD-10-CM | POA: Diagnosis not present

## 2021-04-25 DIAGNOSIS — C9200 Acute myeloblastic leukemia, not having achieved remission: Secondary | ICD-10-CM | POA: Diagnosis not present

## 2021-04-25 DIAGNOSIS — Z95828 Presence of other vascular implants and grafts: Secondary | ICD-10-CM | POA: Diagnosis not present

## 2021-04-25 DIAGNOSIS — D45 Polycythemia vera: Secondary | ICD-10-CM | POA: Diagnosis not present

## 2021-04-30 DIAGNOSIS — Z95828 Presence of other vascular implants and grafts: Secondary | ICD-10-CM | POA: Diagnosis not present

## 2021-04-30 DIAGNOSIS — C9200 Acute myeloblastic leukemia, not having achieved remission: Secondary | ICD-10-CM | POA: Diagnosis not present

## 2021-04-30 DIAGNOSIS — D45 Polycythemia vera: Secondary | ICD-10-CM | POA: Diagnosis not present

## 2021-05-02 DIAGNOSIS — C9200 Acute myeloblastic leukemia, not having achieved remission: Secondary | ICD-10-CM | POA: Diagnosis not present

## 2021-05-02 DIAGNOSIS — Z95828 Presence of other vascular implants and grafts: Secondary | ICD-10-CM | POA: Diagnosis not present

## 2021-05-02 DIAGNOSIS — D45 Polycythemia vera: Secondary | ICD-10-CM | POA: Diagnosis not present

## 2021-05-05 DIAGNOSIS — D45 Polycythemia vera: Secondary | ICD-10-CM | POA: Diagnosis not present

## 2021-05-05 DIAGNOSIS — C9202 Acute myeloblastic leukemia, in relapse: Secondary | ICD-10-CM | POA: Diagnosis not present

## 2021-05-08 DIAGNOSIS — Z95828 Presence of other vascular implants and grafts: Secondary | ICD-10-CM | POA: Diagnosis not present

## 2021-05-08 DIAGNOSIS — C9202 Acute myeloblastic leukemia, in relapse: Secondary | ICD-10-CM | POA: Diagnosis not present

## 2021-05-08 DIAGNOSIS — D45 Polycythemia vera: Secondary | ICD-10-CM | POA: Diagnosis not present

## 2021-05-12 DIAGNOSIS — D45 Polycythemia vera: Secondary | ICD-10-CM | POA: Diagnosis not present

## 2021-05-12 DIAGNOSIS — Z95828 Presence of other vascular implants and grafts: Secondary | ICD-10-CM | POA: Diagnosis not present

## 2021-05-12 DIAGNOSIS — C9202 Acute myeloblastic leukemia, in relapse: Secondary | ICD-10-CM | POA: Diagnosis not present

## 2021-05-15 DIAGNOSIS — C9202 Acute myeloblastic leukemia, in relapse: Secondary | ICD-10-CM | POA: Diagnosis not present

## 2021-05-15 DIAGNOSIS — Z95828 Presence of other vascular implants and grafts: Secondary | ICD-10-CM | POA: Diagnosis not present

## 2021-05-15 DIAGNOSIS — D45 Polycythemia vera: Secondary | ICD-10-CM | POA: Diagnosis not present

## 2021-05-19 DIAGNOSIS — D45 Polycythemia vera: Secondary | ICD-10-CM | POA: Diagnosis not present

## 2021-05-19 DIAGNOSIS — Z95828 Presence of other vascular implants and grafts: Secondary | ICD-10-CM | POA: Diagnosis not present

## 2021-05-19 DIAGNOSIS — C9202 Acute myeloblastic leukemia, in relapse: Secondary | ICD-10-CM | POA: Diagnosis not present

## 2021-05-22 DIAGNOSIS — C9202 Acute myeloblastic leukemia, in relapse: Secondary | ICD-10-CM | POA: Diagnosis not present

## 2021-05-22 DIAGNOSIS — Z95828 Presence of other vascular implants and grafts: Secondary | ICD-10-CM | POA: Diagnosis not present

## 2021-05-22 DIAGNOSIS — D45 Polycythemia vera: Secondary | ICD-10-CM | POA: Diagnosis not present

## 2021-05-26 DIAGNOSIS — C9202 Acute myeloblastic leukemia, in relapse: Secondary | ICD-10-CM | POA: Diagnosis not present

## 2021-05-29 DIAGNOSIS — C9200 Acute myeloblastic leukemia, not having achieved remission: Secondary | ICD-10-CM | POA: Diagnosis not present

## 2021-05-29 DIAGNOSIS — D45 Polycythemia vera: Secondary | ICD-10-CM | POA: Diagnosis not present

## 2021-06-02 DIAGNOSIS — Z23 Encounter for immunization: Secondary | ICD-10-CM | POA: Diagnosis not present

## 2021-06-02 DIAGNOSIS — I272 Pulmonary hypertension, unspecified: Secondary | ICD-10-CM | POA: Diagnosis not present

## 2021-06-02 DIAGNOSIS — K7460 Unspecified cirrhosis of liver: Secondary | ICD-10-CM | POA: Diagnosis not present

## 2021-06-02 DIAGNOSIS — J90 Pleural effusion, not elsewhere classified: Secondary | ICD-10-CM | POA: Diagnosis not present

## 2021-06-02 DIAGNOSIS — R600 Localized edema: Secondary | ICD-10-CM | POA: Diagnosis not present

## 2021-06-02 DIAGNOSIS — D709 Neutropenia, unspecified: Secondary | ICD-10-CM | POA: Diagnosis not present

## 2021-06-02 DIAGNOSIS — K766 Portal hypertension: Secondary | ICD-10-CM | POA: Diagnosis not present

## 2021-06-02 DIAGNOSIS — C9200 Acute myeloblastic leukemia, not having achieved remission: Secondary | ICD-10-CM | POA: Diagnosis not present

## 2021-06-02 DIAGNOSIS — Z8719 Personal history of other diseases of the digestive system: Secondary | ICD-10-CM | POA: Diagnosis not present

## 2021-06-03 DIAGNOSIS — C9200 Acute myeloblastic leukemia, not having achieved remission: Secondary | ICD-10-CM | POA: Diagnosis not present

## 2021-06-03 DIAGNOSIS — Z95828 Presence of other vascular implants and grafts: Secondary | ICD-10-CM | POA: Diagnosis not present

## 2021-06-03 DIAGNOSIS — Z5111 Encounter for antineoplastic chemotherapy: Secondary | ICD-10-CM | POA: Diagnosis not present

## 2021-06-04 DIAGNOSIS — Z95828 Presence of other vascular implants and grafts: Secondary | ICD-10-CM | POA: Diagnosis not present

## 2021-06-04 DIAGNOSIS — C9200 Acute myeloblastic leukemia, not having achieved remission: Secondary | ICD-10-CM | POA: Diagnosis not present

## 2021-06-04 DIAGNOSIS — Z5111 Encounter for antineoplastic chemotherapy: Secondary | ICD-10-CM | POA: Diagnosis not present

## 2021-06-05 DIAGNOSIS — C9200 Acute myeloblastic leukemia, not having achieved remission: Secondary | ICD-10-CM | POA: Diagnosis not present

## 2021-06-05 DIAGNOSIS — D45 Polycythemia vera: Secondary | ICD-10-CM | POA: Diagnosis not present

## 2021-06-05 DIAGNOSIS — Z5111 Encounter for antineoplastic chemotherapy: Secondary | ICD-10-CM | POA: Diagnosis not present

## 2021-06-05 DIAGNOSIS — Z95828 Presence of other vascular implants and grafts: Secondary | ICD-10-CM | POA: Diagnosis not present

## 2021-06-06 DIAGNOSIS — Z95828 Presence of other vascular implants and grafts: Secondary | ICD-10-CM | POA: Diagnosis not present

## 2021-06-06 DIAGNOSIS — Z5111 Encounter for antineoplastic chemotherapy: Secondary | ICD-10-CM | POA: Diagnosis not present

## 2021-06-06 DIAGNOSIS — D45 Polycythemia vera: Secondary | ICD-10-CM | POA: Diagnosis not present

## 2021-06-06 DIAGNOSIS — C9200 Acute myeloblastic leukemia, not having achieved remission: Secondary | ICD-10-CM | POA: Diagnosis not present

## 2021-06-09 DIAGNOSIS — C9200 Acute myeloblastic leukemia, not having achieved remission: Secondary | ICD-10-CM | POA: Diagnosis not present

## 2021-06-09 DIAGNOSIS — D45 Polycythemia vera: Secondary | ICD-10-CM | POA: Diagnosis not present

## 2021-06-09 DIAGNOSIS — Z95828 Presence of other vascular implants and grafts: Secondary | ICD-10-CM | POA: Diagnosis not present

## 2021-06-10 DIAGNOSIS — D45 Polycythemia vera: Secondary | ICD-10-CM | POA: Diagnosis not present

## 2021-06-10 DIAGNOSIS — Z95828 Presence of other vascular implants and grafts: Secondary | ICD-10-CM | POA: Diagnosis not present

## 2021-06-10 DIAGNOSIS — C9200 Acute myeloblastic leukemia, not having achieved remission: Secondary | ICD-10-CM | POA: Diagnosis not present

## 2021-06-12 DIAGNOSIS — Z95828 Presence of other vascular implants and grafts: Secondary | ICD-10-CM | POA: Diagnosis not present

## 2021-06-12 DIAGNOSIS — C9200 Acute myeloblastic leukemia, not having achieved remission: Secondary | ICD-10-CM | POA: Diagnosis not present

## 2021-06-12 DIAGNOSIS — D45 Polycythemia vera: Secondary | ICD-10-CM | POA: Diagnosis not present

## 2021-06-16 DIAGNOSIS — C9200 Acute myeloblastic leukemia, not having achieved remission: Secondary | ICD-10-CM | POA: Diagnosis not present

## 2021-06-16 DIAGNOSIS — D45 Polycythemia vera: Secondary | ICD-10-CM | POA: Diagnosis not present

## 2021-06-16 DIAGNOSIS — Z95828 Presence of other vascular implants and grafts: Secondary | ICD-10-CM | POA: Diagnosis not present

## 2021-06-19 DIAGNOSIS — Z95828 Presence of other vascular implants and grafts: Secondary | ICD-10-CM | POA: Diagnosis not present

## 2021-06-19 DIAGNOSIS — C9200 Acute myeloblastic leukemia, not having achieved remission: Secondary | ICD-10-CM | POA: Diagnosis not present

## 2021-06-19 DIAGNOSIS — D45 Polycythemia vera: Secondary | ICD-10-CM | POA: Diagnosis not present

## 2021-06-23 DIAGNOSIS — D45 Polycythemia vera: Secondary | ICD-10-CM | POA: Diagnosis not present

## 2021-06-23 DIAGNOSIS — Z95828 Presence of other vascular implants and grafts: Secondary | ICD-10-CM | POA: Diagnosis not present

## 2021-06-23 DIAGNOSIS — C9200 Acute myeloblastic leukemia, not having achieved remission: Secondary | ICD-10-CM | POA: Diagnosis not present

## 2021-06-26 DIAGNOSIS — D45 Polycythemia vera: Secondary | ICD-10-CM | POA: Diagnosis not present

## 2021-06-26 DIAGNOSIS — C9200 Acute myeloblastic leukemia, not having achieved remission: Secondary | ICD-10-CM | POA: Diagnosis not present

## 2021-06-26 DIAGNOSIS — Z95828 Presence of other vascular implants and grafts: Secondary | ICD-10-CM | POA: Diagnosis not present

## 2021-06-30 DIAGNOSIS — D45 Polycythemia vera: Secondary | ICD-10-CM | POA: Diagnosis not present

## 2021-06-30 DIAGNOSIS — Z95828 Presence of other vascular implants and grafts: Secondary | ICD-10-CM | POA: Diagnosis not present

## 2021-06-30 DIAGNOSIS — C9200 Acute myeloblastic leukemia, not having achieved remission: Secondary | ICD-10-CM | POA: Diagnosis not present

## 2021-07-03 DIAGNOSIS — C9202 Acute myeloblastic leukemia, in relapse: Secondary | ICD-10-CM | POA: Diagnosis not present

## 2021-07-07 DIAGNOSIS — Z95828 Presence of other vascular implants and grafts: Secondary | ICD-10-CM | POA: Diagnosis not present

## 2021-07-07 DIAGNOSIS — I748 Embolism and thrombosis of other arteries: Secondary | ICD-10-CM | POA: Diagnosis not present

## 2021-07-07 DIAGNOSIS — D471 Chronic myeloproliferative disease: Secondary | ICD-10-CM | POA: Diagnosis not present

## 2021-07-07 DIAGNOSIS — C9200 Acute myeloblastic leukemia, not having achieved remission: Secondary | ICD-10-CM | POA: Diagnosis not present

## 2021-07-07 DIAGNOSIS — K7460 Unspecified cirrhosis of liver: Secondary | ICD-10-CM | POA: Diagnosis not present

## 2021-07-07 DIAGNOSIS — D45 Polycythemia vera: Secondary | ICD-10-CM | POA: Diagnosis not present

## 2021-07-07 DIAGNOSIS — K219 Gastro-esophageal reflux disease without esophagitis: Secondary | ICD-10-CM | POA: Diagnosis not present

## 2021-07-07 DIAGNOSIS — J90 Pleural effusion, not elsewhere classified: Secondary | ICD-10-CM | POA: Diagnosis not present

## 2021-07-07 DIAGNOSIS — K766 Portal hypertension: Secondary | ICD-10-CM | POA: Diagnosis not present

## 2021-07-07 DIAGNOSIS — R600 Localized edema: Secondary | ICD-10-CM | POA: Diagnosis not present

## 2021-07-07 DIAGNOSIS — D61818 Other pancytopenia: Secondary | ICD-10-CM | POA: Diagnosis not present

## 2021-07-07 DIAGNOSIS — I272 Pulmonary hypertension, unspecified: Secondary | ICD-10-CM | POA: Diagnosis not present

## 2021-07-07 DIAGNOSIS — L538 Other specified erythematous conditions: Secondary | ICD-10-CM | POA: Diagnosis not present

## 2021-07-10 DIAGNOSIS — D45 Polycythemia vera: Secondary | ICD-10-CM | POA: Diagnosis not present

## 2021-07-10 DIAGNOSIS — C9202 Acute myeloblastic leukemia, in relapse: Secondary | ICD-10-CM | POA: Diagnosis not present

## 2021-07-14 DIAGNOSIS — D45 Polycythemia vera: Secondary | ICD-10-CM | POA: Diagnosis not present

## 2021-07-14 DIAGNOSIS — C9202 Acute myeloblastic leukemia, in relapse: Secondary | ICD-10-CM | POA: Diagnosis not present

## 2021-07-14 DIAGNOSIS — Z95828 Presence of other vascular implants and grafts: Secondary | ICD-10-CM | POA: Diagnosis not present

## 2021-07-14 DIAGNOSIS — Z5111 Encounter for antineoplastic chemotherapy: Secondary | ICD-10-CM | POA: Diagnosis not present

## 2021-07-15 DIAGNOSIS — Z5111 Encounter for antineoplastic chemotherapy: Secondary | ICD-10-CM | POA: Diagnosis not present

## 2021-07-15 DIAGNOSIS — C9202 Acute myeloblastic leukemia, in relapse: Secondary | ICD-10-CM | POA: Diagnosis not present

## 2021-07-15 DIAGNOSIS — Z95828 Presence of other vascular implants and grafts: Secondary | ICD-10-CM | POA: Diagnosis not present

## 2021-07-16 DIAGNOSIS — Z95828 Presence of other vascular implants and grafts: Secondary | ICD-10-CM | POA: Diagnosis not present

## 2021-07-16 DIAGNOSIS — C9202 Acute myeloblastic leukemia, in relapse: Secondary | ICD-10-CM | POA: Diagnosis not present

## 2021-07-16 DIAGNOSIS — Z5111 Encounter for antineoplastic chemotherapy: Secondary | ICD-10-CM | POA: Diagnosis not present

## 2021-07-18 DIAGNOSIS — Z5111 Encounter for antineoplastic chemotherapy: Secondary | ICD-10-CM | POA: Diagnosis not present

## 2021-07-18 DIAGNOSIS — C9202 Acute myeloblastic leukemia, in relapse: Secondary | ICD-10-CM | POA: Diagnosis not present

## 2021-07-18 DIAGNOSIS — D45 Polycythemia vera: Secondary | ICD-10-CM | POA: Diagnosis not present

## 2021-07-18 DIAGNOSIS — Z95828 Presence of other vascular implants and grafts: Secondary | ICD-10-CM | POA: Diagnosis not present

## 2021-07-19 DIAGNOSIS — Z5111 Encounter for antineoplastic chemotherapy: Secondary | ICD-10-CM | POA: Diagnosis not present

## 2021-07-19 DIAGNOSIS — C9202 Acute myeloblastic leukemia, in relapse: Secondary | ICD-10-CM | POA: Diagnosis not present

## 2021-07-19 DIAGNOSIS — Z95828 Presence of other vascular implants and grafts: Secondary | ICD-10-CM | POA: Diagnosis not present

## 2021-07-21 DIAGNOSIS — C9202 Acute myeloblastic leukemia, in relapse: Secondary | ICD-10-CM | POA: Diagnosis not present

## 2021-07-21 DIAGNOSIS — D45 Polycythemia vera: Secondary | ICD-10-CM | POA: Diagnosis not present

## 2021-07-24 DIAGNOSIS — D45 Polycythemia vera: Secondary | ICD-10-CM | POA: Diagnosis not present

## 2021-07-24 DIAGNOSIS — C9202 Acute myeloblastic leukemia, in relapse: Secondary | ICD-10-CM | POA: Diagnosis not present

## 2021-07-24 DIAGNOSIS — Z95828 Presence of other vascular implants and grafts: Secondary | ICD-10-CM | POA: Diagnosis not present

## 2021-07-28 DIAGNOSIS — Z95828 Presence of other vascular implants and grafts: Secondary | ICD-10-CM | POA: Diagnosis not present

## 2021-07-28 DIAGNOSIS — D45 Polycythemia vera: Secondary | ICD-10-CM | POA: Diagnosis not present

## 2021-07-28 DIAGNOSIS — C9202 Acute myeloblastic leukemia, in relapse: Secondary | ICD-10-CM | POA: Diagnosis not present

## 2021-07-31 DIAGNOSIS — D45 Polycythemia vera: Secondary | ICD-10-CM | POA: Diagnosis not present

## 2021-07-31 DIAGNOSIS — Z95828 Presence of other vascular implants and grafts: Secondary | ICD-10-CM | POA: Diagnosis not present

## 2021-07-31 DIAGNOSIS — C9202 Acute myeloblastic leukemia, in relapse: Secondary | ICD-10-CM | POA: Diagnosis not present

## 2021-08-01 DIAGNOSIS — R748 Abnormal levels of other serum enzymes: Secondary | ICD-10-CM | POA: Diagnosis not present

## 2021-08-01 DIAGNOSIS — K7460 Unspecified cirrhosis of liver: Secondary | ICD-10-CM | POA: Diagnosis not present

## 2021-08-01 DIAGNOSIS — R188 Other ascites: Secondary | ICD-10-CM | POA: Diagnosis not present

## 2021-08-01 DIAGNOSIS — D696 Thrombocytopenia, unspecified: Secondary | ICD-10-CM | POA: Diagnosis not present

## 2021-08-01 DIAGNOSIS — R162 Hepatomegaly with splenomegaly, not elsewhere classified: Secondary | ICD-10-CM | POA: Diagnosis not present

## 2021-08-04 DIAGNOSIS — D45 Polycythemia vera: Secondary | ICD-10-CM | POA: Diagnosis not present

## 2021-08-04 DIAGNOSIS — C9202 Acute myeloblastic leukemia, in relapse: Secondary | ICD-10-CM | POA: Diagnosis not present

## 2021-08-04 DIAGNOSIS — Z95828 Presence of other vascular implants and grafts: Secondary | ICD-10-CM | POA: Diagnosis not present

## 2021-08-07 DIAGNOSIS — Z95828 Presence of other vascular implants and grafts: Secondary | ICD-10-CM | POA: Diagnosis not present

## 2021-08-07 DIAGNOSIS — D45 Polycythemia vera: Secondary | ICD-10-CM | POA: Diagnosis not present

## 2021-08-07 DIAGNOSIS — C9202 Acute myeloblastic leukemia, in relapse: Secondary | ICD-10-CM | POA: Diagnosis not present

## 2021-08-11 DIAGNOSIS — D45 Polycythemia vera: Secondary | ICD-10-CM | POA: Diagnosis not present

## 2021-08-11 DIAGNOSIS — C9202 Acute myeloblastic leukemia, in relapse: Secondary | ICD-10-CM | POA: Diagnosis not present

## 2021-08-11 DIAGNOSIS — Z95828 Presence of other vascular implants and grafts: Secondary | ICD-10-CM | POA: Diagnosis not present

## 2021-08-11 DIAGNOSIS — K7460 Unspecified cirrhosis of liver: Secondary | ICD-10-CM | POA: Diagnosis not present

## 2021-08-13 DIAGNOSIS — K8020 Calculus of gallbladder without cholecystitis without obstruction: Secondary | ICD-10-CM | POA: Diagnosis not present

## 2021-08-13 DIAGNOSIS — R188 Other ascites: Secondary | ICD-10-CM | POA: Diagnosis not present

## 2021-08-13 DIAGNOSIS — K7460 Unspecified cirrhosis of liver: Secondary | ICD-10-CM | POA: Diagnosis not present

## 2021-08-14 DIAGNOSIS — R600 Localized edema: Secondary | ICD-10-CM | POA: Diagnosis not present

## 2021-08-14 DIAGNOSIS — C9201 Acute myeloblastic leukemia, in remission: Secondary | ICD-10-CM | POA: Diagnosis not present

## 2021-08-14 DIAGNOSIS — I513 Intracardiac thrombosis, not elsewhere classified: Secondary | ICD-10-CM | POA: Diagnosis not present

## 2021-08-14 DIAGNOSIS — I81 Portal vein thrombosis: Secondary | ICD-10-CM | POA: Diagnosis not present

## 2021-08-14 DIAGNOSIS — J90 Pleural effusion, not elsewhere classified: Secondary | ICD-10-CM | POA: Diagnosis not present

## 2021-08-14 DIAGNOSIS — I272 Pulmonary hypertension, unspecified: Secondary | ICD-10-CM | POA: Diagnosis not present

## 2021-08-14 DIAGNOSIS — R609 Edema, unspecified: Secondary | ICD-10-CM | POA: Diagnosis not present

## 2021-08-14 DIAGNOSIS — K7031 Alcoholic cirrhosis of liver with ascites: Secondary | ICD-10-CM | POA: Diagnosis not present

## 2021-08-14 DIAGNOSIS — Z95828 Presence of other vascular implants and grafts: Secondary | ICD-10-CM | POA: Diagnosis not present

## 2021-08-14 DIAGNOSIS — K219 Gastro-esophageal reflux disease without esophagitis: Secondary | ICD-10-CM | POA: Diagnosis not present

## 2021-08-14 DIAGNOSIS — Z959 Presence of cardiac and vascular implant and graft, unspecified: Secondary | ICD-10-CM | POA: Diagnosis not present

## 2021-08-16 DIAGNOSIS — C9201 Acute myeloblastic leukemia, in remission: Secondary | ICD-10-CM | POA: Diagnosis not present

## 2021-08-17 DIAGNOSIS — C9201 Acute myeloblastic leukemia, in remission: Secondary | ICD-10-CM | POA: Diagnosis not present

## 2021-08-19 DIAGNOSIS — C9202 Acute myeloblastic leukemia, in relapse: Secondary | ICD-10-CM | POA: Diagnosis not present

## 2021-08-22 DIAGNOSIS — D45 Polycythemia vera: Secondary | ICD-10-CM | POA: Diagnosis not present

## 2021-08-22 DIAGNOSIS — C9202 Acute myeloblastic leukemia, in relapse: Secondary | ICD-10-CM | POA: Diagnosis not present

## 2021-08-22 DIAGNOSIS — Z95828 Presence of other vascular implants and grafts: Secondary | ICD-10-CM | POA: Diagnosis not present

## 2021-08-26 DIAGNOSIS — C9202 Acute myeloblastic leukemia, in relapse: Secondary | ICD-10-CM | POA: Diagnosis not present

## 2021-08-26 DIAGNOSIS — D45 Polycythemia vera: Secondary | ICD-10-CM | POA: Diagnosis not present

## 2021-08-26 DIAGNOSIS — Z95828 Presence of other vascular implants and grafts: Secondary | ICD-10-CM | POA: Diagnosis not present

## 2021-08-29 DIAGNOSIS — Z95828 Presence of other vascular implants and grafts: Secondary | ICD-10-CM | POA: Diagnosis not present

## 2021-08-29 DIAGNOSIS — D45 Polycythemia vera: Secondary | ICD-10-CM | POA: Diagnosis not present

## 2021-08-29 DIAGNOSIS — C9202 Acute myeloblastic leukemia, in relapse: Secondary | ICD-10-CM | POA: Diagnosis not present

## 2021-09-01 DIAGNOSIS — Z452 Encounter for adjustment and management of vascular access device: Secondary | ICD-10-CM | POA: Diagnosis not present

## 2021-09-01 DIAGNOSIS — T82898D Other specified complication of vascular prosthetic devices, implants and grafts, subsequent encounter: Secondary | ICD-10-CM | POA: Diagnosis not present

## 2021-09-01 DIAGNOSIS — D45 Polycythemia vera: Secondary | ICD-10-CM | POA: Diagnosis not present

## 2021-09-01 DIAGNOSIS — Z95828 Presence of other vascular implants and grafts: Secondary | ICD-10-CM | POA: Diagnosis not present

## 2021-09-01 DIAGNOSIS — C9202 Acute myeloblastic leukemia, in relapse: Secondary | ICD-10-CM | POA: Diagnosis not present

## 2021-09-04 DIAGNOSIS — Z95828 Presence of other vascular implants and grafts: Secondary | ICD-10-CM | POA: Diagnosis not present

## 2021-09-04 DIAGNOSIS — C9202 Acute myeloblastic leukemia, in relapse: Secondary | ICD-10-CM | POA: Diagnosis not present

## 2021-09-04 DIAGNOSIS — D45 Polycythemia vera: Secondary | ICD-10-CM | POA: Diagnosis not present

## 2021-09-09 DIAGNOSIS — Z95828 Presence of other vascular implants and grafts: Secondary | ICD-10-CM | POA: Diagnosis not present

## 2021-09-09 DIAGNOSIS — C9202 Acute myeloblastic leukemia, in relapse: Secondary | ICD-10-CM | POA: Diagnosis not present

## 2021-09-09 DIAGNOSIS — D45 Polycythemia vera: Secondary | ICD-10-CM | POA: Diagnosis not present

## 2021-09-11 DIAGNOSIS — K7460 Unspecified cirrhosis of liver: Secondary | ICD-10-CM | POA: Diagnosis not present

## 2021-09-11 DIAGNOSIS — C9200 Acute myeloblastic leukemia, not having achieved remission: Secondary | ICD-10-CM | POA: Diagnosis not present

## 2021-09-11 DIAGNOSIS — Z95828 Presence of other vascular implants and grafts: Secondary | ICD-10-CM | POA: Diagnosis not present

## 2021-09-11 DIAGNOSIS — R188 Other ascites: Secondary | ICD-10-CM | POA: Diagnosis not present

## 2021-09-11 DIAGNOSIS — C9201 Acute myeloblastic leukemia, in remission: Secondary | ICD-10-CM | POA: Diagnosis not present

## 2021-09-11 DIAGNOSIS — D630 Anemia in neoplastic disease: Secondary | ICD-10-CM | POA: Diagnosis not present

## 2021-09-11 DIAGNOSIS — D45 Polycythemia vera: Secondary | ICD-10-CM | POA: Diagnosis not present

## 2021-09-11 DIAGNOSIS — J90 Pleural effusion, not elsewhere classified: Secondary | ICD-10-CM | POA: Diagnosis not present

## 2021-09-11 DIAGNOSIS — Z86718 Personal history of other venous thrombosis and embolism: Secondary | ICD-10-CM | POA: Diagnosis not present

## 2021-09-11 DIAGNOSIS — D696 Thrombocytopenia, unspecified: Secondary | ICD-10-CM | POA: Diagnosis not present

## 2021-09-15 DIAGNOSIS — D45 Polycythemia vera: Secondary | ICD-10-CM | POA: Diagnosis not present

## 2021-09-15 DIAGNOSIS — Z5111 Encounter for antineoplastic chemotherapy: Secondary | ICD-10-CM | POA: Diagnosis not present

## 2021-09-15 DIAGNOSIS — Z95828 Presence of other vascular implants and grafts: Secondary | ICD-10-CM | POA: Diagnosis not present

## 2021-09-15 DIAGNOSIS — C9200 Acute myeloblastic leukemia, not having achieved remission: Secondary | ICD-10-CM | POA: Diagnosis not present

## 2021-09-16 DIAGNOSIS — C9200 Acute myeloblastic leukemia, not having achieved remission: Secondary | ICD-10-CM | POA: Diagnosis not present

## 2021-09-16 DIAGNOSIS — Z95828 Presence of other vascular implants and grafts: Secondary | ICD-10-CM | POA: Diagnosis not present

## 2021-09-16 DIAGNOSIS — D45 Polycythemia vera: Secondary | ICD-10-CM | POA: Diagnosis not present

## 2021-09-16 DIAGNOSIS — Z5111 Encounter for antineoplastic chemotherapy: Secondary | ICD-10-CM | POA: Diagnosis not present

## 2021-09-17 DIAGNOSIS — C9200 Acute myeloblastic leukemia, not having achieved remission: Secondary | ICD-10-CM | POA: Diagnosis not present

## 2021-09-17 DIAGNOSIS — Z5111 Encounter for antineoplastic chemotherapy: Secondary | ICD-10-CM | POA: Diagnosis not present

## 2021-09-17 DIAGNOSIS — D45 Polycythemia vera: Secondary | ICD-10-CM | POA: Diagnosis not present

## 2021-09-17 DIAGNOSIS — Z95828 Presence of other vascular implants and grafts: Secondary | ICD-10-CM | POA: Diagnosis not present

## 2021-09-18 DIAGNOSIS — D45 Polycythemia vera: Secondary | ICD-10-CM | POA: Diagnosis not present

## 2021-09-18 DIAGNOSIS — Z95828 Presence of other vascular implants and grafts: Secondary | ICD-10-CM | POA: Diagnosis not present

## 2021-09-18 DIAGNOSIS — Z5111 Encounter for antineoplastic chemotherapy: Secondary | ICD-10-CM | POA: Diagnosis not present

## 2021-09-18 DIAGNOSIS — C9200 Acute myeloblastic leukemia, not having achieved remission: Secondary | ICD-10-CM | POA: Diagnosis not present

## 2021-09-19 DIAGNOSIS — Z5111 Encounter for antineoplastic chemotherapy: Secondary | ICD-10-CM | POA: Diagnosis not present

## 2021-09-19 DIAGNOSIS — C9200 Acute myeloblastic leukemia, not having achieved remission: Secondary | ICD-10-CM | POA: Diagnosis not present

## 2021-09-19 DIAGNOSIS — Z95828 Presence of other vascular implants and grafts: Secondary | ICD-10-CM | POA: Diagnosis not present

## 2021-09-19 DIAGNOSIS — D45 Polycythemia vera: Secondary | ICD-10-CM | POA: Diagnosis not present

## 2021-09-22 DIAGNOSIS — C9201 Acute myeloblastic leukemia, in remission: Secondary | ICD-10-CM | POA: Diagnosis not present

## 2021-09-22 DIAGNOSIS — Z79899 Other long term (current) drug therapy: Secondary | ICD-10-CM | POA: Diagnosis not present

## 2021-09-25 DIAGNOSIS — C9200 Acute myeloblastic leukemia, not having achieved remission: Secondary | ICD-10-CM | POA: Diagnosis not present

## 2021-09-25 DIAGNOSIS — Z79899 Other long term (current) drug therapy: Secondary | ICD-10-CM | POA: Diagnosis not present

## 2021-09-25 DIAGNOSIS — D45 Polycythemia vera: Secondary | ICD-10-CM | POA: Diagnosis not present

## 2021-09-25 DIAGNOSIS — Z95828 Presence of other vascular implants and grafts: Secondary | ICD-10-CM | POA: Diagnosis not present

## 2021-09-29 DIAGNOSIS — R7989 Other specified abnormal findings of blood chemistry: Secondary | ICD-10-CM | POA: Diagnosis not present

## 2021-09-29 DIAGNOSIS — D45 Polycythemia vera: Secondary | ICD-10-CM | POA: Diagnosis not present

## 2021-09-29 DIAGNOSIS — Z95828 Presence of other vascular implants and grafts: Secondary | ICD-10-CM | POA: Diagnosis not present

## 2021-09-29 DIAGNOSIS — C9200 Acute myeloblastic leukemia, not having achieved remission: Secondary | ICD-10-CM | POA: Diagnosis not present

## 2021-09-30 DIAGNOSIS — R932 Abnormal findings on diagnostic imaging of liver and biliary tract: Secondary | ICD-10-CM | POA: Diagnosis not present

## 2021-09-30 DIAGNOSIS — K7401 Hepatic fibrosis, early fibrosis: Secondary | ICD-10-CM | POA: Diagnosis not present

## 2021-09-30 DIAGNOSIS — K7460 Unspecified cirrhosis of liver: Secondary | ICD-10-CM | POA: Diagnosis not present

## 2021-10-02 DIAGNOSIS — C9200 Acute myeloblastic leukemia, not having achieved remission: Secondary | ICD-10-CM | POA: Diagnosis not present

## 2021-10-02 DIAGNOSIS — R7989 Other specified abnormal findings of blood chemistry: Secondary | ICD-10-CM | POA: Diagnosis not present

## 2021-10-02 DIAGNOSIS — D45 Polycythemia vera: Secondary | ICD-10-CM | POA: Diagnosis not present

## 2021-10-02 DIAGNOSIS — Z95828 Presence of other vascular implants and grafts: Secondary | ICD-10-CM | POA: Diagnosis not present

## 2021-10-06 DIAGNOSIS — D45 Polycythemia vera: Secondary | ICD-10-CM | POA: Diagnosis not present

## 2021-10-06 DIAGNOSIS — R7989 Other specified abnormal findings of blood chemistry: Secondary | ICD-10-CM | POA: Diagnosis not present

## 2021-10-06 DIAGNOSIS — Z95828 Presence of other vascular implants and grafts: Secondary | ICD-10-CM | POA: Diagnosis not present

## 2021-10-06 DIAGNOSIS — C9200 Acute myeloblastic leukemia, not having achieved remission: Secondary | ICD-10-CM | POA: Diagnosis not present

## 2021-10-09 DIAGNOSIS — D630 Anemia in neoplastic disease: Secondary | ICD-10-CM | POA: Diagnosis not present

## 2021-10-09 DIAGNOSIS — R600 Localized edema: Secondary | ICD-10-CM | POA: Diagnosis not present

## 2021-10-09 DIAGNOSIS — Z8711 Personal history of peptic ulcer disease: Secondary | ICD-10-CM | POA: Diagnosis not present

## 2021-10-09 DIAGNOSIS — D709 Neutropenia, unspecified: Secondary | ICD-10-CM | POA: Diagnosis not present

## 2021-10-09 DIAGNOSIS — C9200 Acute myeloblastic leukemia, not having achieved remission: Secondary | ICD-10-CM | POA: Diagnosis not present

## 2021-10-09 DIAGNOSIS — D45 Polycythemia vera: Secondary | ICD-10-CM | POA: Diagnosis not present

## 2021-10-09 DIAGNOSIS — D693 Immune thrombocytopenic purpura: Secondary | ICD-10-CM | POA: Diagnosis not present

## 2021-10-09 DIAGNOSIS — M720 Palmar fascial fibromatosis [Dupuytren]: Secondary | ICD-10-CM | POA: Diagnosis not present

## 2021-10-09 DIAGNOSIS — C9201 Acute myeloblastic leukemia, in remission: Secondary | ICD-10-CM | POA: Diagnosis not present

## 2021-10-09 DIAGNOSIS — J90 Pleural effusion, not elsewhere classified: Secondary | ICD-10-CM | POA: Diagnosis not present

## 2021-10-09 DIAGNOSIS — D61818 Other pancytopenia: Secondary | ICD-10-CM | POA: Diagnosis not present

## 2021-10-09 DIAGNOSIS — R188 Other ascites: Secondary | ICD-10-CM | POA: Diagnosis not present

## 2021-10-09 DIAGNOSIS — D696 Thrombocytopenia, unspecified: Secondary | ICD-10-CM | POA: Diagnosis not present

## 2021-10-09 DIAGNOSIS — K7460 Unspecified cirrhosis of liver: Secondary | ICD-10-CM | POA: Diagnosis not present

## 2021-10-13 DIAGNOSIS — D45 Polycythemia vera: Secondary | ICD-10-CM | POA: Diagnosis not present

## 2021-10-13 DIAGNOSIS — Z95828 Presence of other vascular implants and grafts: Secondary | ICD-10-CM | POA: Diagnosis not present

## 2021-10-13 DIAGNOSIS — C9200 Acute myeloblastic leukemia, not having achieved remission: Secondary | ICD-10-CM | POA: Diagnosis not present

## 2021-10-13 DIAGNOSIS — R7989 Other specified abnormal findings of blood chemistry: Secondary | ICD-10-CM | POA: Diagnosis not present

## 2021-10-16 DIAGNOSIS — Z79899 Other long term (current) drug therapy: Secondary | ICD-10-CM | POA: Diagnosis not present

## 2021-10-16 DIAGNOSIS — D45 Polycythemia vera: Secondary | ICD-10-CM | POA: Diagnosis not present

## 2021-10-16 DIAGNOSIS — Z95828 Presence of other vascular implants and grafts: Secondary | ICD-10-CM | POA: Diagnosis not present

## 2021-10-16 DIAGNOSIS — C9200 Acute myeloblastic leukemia, not having achieved remission: Secondary | ICD-10-CM | POA: Diagnosis not present

## 2021-10-20 DIAGNOSIS — D45 Polycythemia vera: Secondary | ICD-10-CM | POA: Diagnosis not present

## 2021-10-20 DIAGNOSIS — R7989 Other specified abnormal findings of blood chemistry: Secondary | ICD-10-CM | POA: Diagnosis not present

## 2021-10-20 DIAGNOSIS — Z95828 Presence of other vascular implants and grafts: Secondary | ICD-10-CM | POA: Diagnosis not present

## 2021-10-20 DIAGNOSIS — C9200 Acute myeloblastic leukemia, not having achieved remission: Secondary | ICD-10-CM | POA: Diagnosis not present

## 2021-10-23 DIAGNOSIS — D45 Polycythemia vera: Secondary | ICD-10-CM | POA: Diagnosis not present

## 2021-10-23 DIAGNOSIS — Z95828 Presence of other vascular implants and grafts: Secondary | ICD-10-CM | POA: Diagnosis not present

## 2021-10-23 DIAGNOSIS — C9200 Acute myeloblastic leukemia, not having achieved remission: Secondary | ICD-10-CM | POA: Diagnosis not present

## 2021-10-27 DIAGNOSIS — Z95828 Presence of other vascular implants and grafts: Secondary | ICD-10-CM | POA: Diagnosis not present

## 2021-10-27 DIAGNOSIS — D45 Polycythemia vera: Secondary | ICD-10-CM | POA: Diagnosis not present

## 2021-10-27 DIAGNOSIS — C9200 Acute myeloblastic leukemia, not having achieved remission: Secondary | ICD-10-CM | POA: Diagnosis not present

## 2021-10-27 DIAGNOSIS — Z79899 Other long term (current) drug therapy: Secondary | ICD-10-CM | POA: Diagnosis not present

## 2021-10-30 DIAGNOSIS — C9200 Acute myeloblastic leukemia, not having achieved remission: Secondary | ICD-10-CM | POA: Diagnosis not present

## 2021-10-30 DIAGNOSIS — Z79899 Other long term (current) drug therapy: Secondary | ICD-10-CM | POA: Diagnosis not present

## 2021-10-30 DIAGNOSIS — Z95828 Presence of other vascular implants and grafts: Secondary | ICD-10-CM | POA: Diagnosis not present

## 2021-10-30 DIAGNOSIS — D45 Polycythemia vera: Secondary | ICD-10-CM | POA: Diagnosis not present

## 2021-11-03 DIAGNOSIS — Z95828 Presence of other vascular implants and grafts: Secondary | ICD-10-CM | POA: Diagnosis not present

## 2021-11-03 DIAGNOSIS — D45 Polycythemia vera: Secondary | ICD-10-CM | POA: Diagnosis not present

## 2021-11-03 DIAGNOSIS — C9200 Acute myeloblastic leukemia, not having achieved remission: Secondary | ICD-10-CM | POA: Diagnosis not present

## 2021-11-06 DIAGNOSIS — Z95828 Presence of other vascular implants and grafts: Secondary | ICD-10-CM | POA: Diagnosis not present

## 2021-11-06 DIAGNOSIS — C9200 Acute myeloblastic leukemia, not having achieved remission: Secondary | ICD-10-CM | POA: Diagnosis not present

## 2021-11-06 DIAGNOSIS — Z79899 Other long term (current) drug therapy: Secondary | ICD-10-CM | POA: Diagnosis not present

## 2021-11-06 DIAGNOSIS — D45 Polycythemia vera: Secondary | ICD-10-CM | POA: Diagnosis not present

## 2021-11-10 DIAGNOSIS — J90 Pleural effusion, not elsewhere classified: Secondary | ICD-10-CM | POA: Diagnosis not present

## 2021-11-10 DIAGNOSIS — D61818 Other pancytopenia: Secondary | ICD-10-CM | POA: Diagnosis not present

## 2021-11-10 DIAGNOSIS — C9200 Acute myeloblastic leukemia, not having achieved remission: Secondary | ICD-10-CM | POA: Diagnosis not present

## 2021-11-10 DIAGNOSIS — D630 Anemia in neoplastic disease: Secondary | ICD-10-CM | POA: Diagnosis not present

## 2021-11-10 DIAGNOSIS — D45 Polycythemia vera: Secondary | ICD-10-CM | POA: Diagnosis not present

## 2021-11-10 DIAGNOSIS — Z95828 Presence of other vascular implants and grafts: Secondary | ICD-10-CM | POA: Diagnosis not present

## 2021-11-10 DIAGNOSIS — R600 Localized edema: Secondary | ICD-10-CM | POA: Diagnosis not present

## 2021-11-10 DIAGNOSIS — D696 Thrombocytopenia, unspecified: Secondary | ICD-10-CM | POA: Diagnosis not present

## 2021-11-10 DIAGNOSIS — Z86718 Personal history of other venous thrombosis and embolism: Secondary | ICD-10-CM | POA: Diagnosis not present

## 2021-11-10 DIAGNOSIS — R188 Other ascites: Secondary | ICD-10-CM | POA: Diagnosis not present

## 2021-11-10 DIAGNOSIS — K7469 Other cirrhosis of liver: Secondary | ICD-10-CM | POA: Diagnosis not present

## 2021-11-10 DIAGNOSIS — C9201 Acute myeloblastic leukemia, in remission: Secondary | ICD-10-CM | POA: Diagnosis not present

## 2021-11-10 DIAGNOSIS — M720 Palmar fascial fibromatosis [Dupuytren]: Secondary | ICD-10-CM | POA: Diagnosis not present

## 2021-11-10 DIAGNOSIS — K7460 Unspecified cirrhosis of liver: Secondary | ICD-10-CM | POA: Diagnosis not present

## 2021-11-10 DIAGNOSIS — D709 Neutropenia, unspecified: Secondary | ICD-10-CM | POA: Diagnosis not present

## 2021-11-13 DIAGNOSIS — C9201 Acute myeloblastic leukemia, in remission: Secondary | ICD-10-CM | POA: Diagnosis not present

## 2021-11-13 DIAGNOSIS — D45 Polycythemia vera: Secondary | ICD-10-CM | POA: Diagnosis not present

## 2021-11-17 DIAGNOSIS — C9201 Acute myeloblastic leukemia, in remission: Secondary | ICD-10-CM | POA: Diagnosis not present

## 2021-11-17 DIAGNOSIS — D45 Polycythemia vera: Secondary | ICD-10-CM | POA: Diagnosis not present

## 2021-11-17 DIAGNOSIS — R7989 Other specified abnormal findings of blood chemistry: Secondary | ICD-10-CM | POA: Diagnosis not present

## 2021-11-20 DIAGNOSIS — Z95828 Presence of other vascular implants and grafts: Secondary | ICD-10-CM | POA: Diagnosis not present

## 2021-11-20 DIAGNOSIS — C9201 Acute myeloblastic leukemia, in remission: Secondary | ICD-10-CM | POA: Diagnosis not present

## 2021-11-20 DIAGNOSIS — D45 Polycythemia vera: Secondary | ICD-10-CM | POA: Diagnosis not present

## 2021-11-24 DIAGNOSIS — K7460 Unspecified cirrhosis of liver: Secondary | ICD-10-CM | POA: Diagnosis not present

## 2021-11-24 DIAGNOSIS — D630 Anemia in neoplastic disease: Secondary | ICD-10-CM | POA: Diagnosis not present

## 2021-11-24 DIAGNOSIS — D696 Thrombocytopenia, unspecified: Secondary | ICD-10-CM | POA: Diagnosis not present

## 2021-11-24 DIAGNOSIS — Z8739 Personal history of other diseases of the musculoskeletal system and connective tissue: Secondary | ICD-10-CM | POA: Diagnosis not present

## 2021-11-24 DIAGNOSIS — D45 Polycythemia vera: Secondary | ICD-10-CM | POA: Diagnosis not present

## 2021-11-24 DIAGNOSIS — D61818 Other pancytopenia: Secondary | ICD-10-CM | POA: Diagnosis not present

## 2021-11-24 DIAGNOSIS — J90 Pleural effusion, not elsewhere classified: Secondary | ICD-10-CM | POA: Diagnosis not present

## 2021-11-24 DIAGNOSIS — D701 Agranulocytosis secondary to cancer chemotherapy: Secondary | ICD-10-CM | POA: Diagnosis not present

## 2021-11-24 DIAGNOSIS — K251 Acute gastric ulcer with perforation: Secondary | ICD-10-CM | POA: Diagnosis not present

## 2021-11-24 DIAGNOSIS — C9201 Acute myeloblastic leukemia, in remission: Secondary | ICD-10-CM | POA: Diagnosis not present

## 2021-11-24 DIAGNOSIS — R600 Localized edema: Secondary | ICD-10-CM | POA: Diagnosis not present

## 2021-11-24 DIAGNOSIS — D709 Neutropenia, unspecified: Secondary | ICD-10-CM | POA: Diagnosis not present

## 2021-11-24 DIAGNOSIS — D471 Chronic myeloproliferative disease: Secondary | ICD-10-CM | POA: Diagnosis not present

## 2021-11-24 DIAGNOSIS — R188 Other ascites: Secondary | ICD-10-CM | POA: Diagnosis not present

## 2021-11-24 DIAGNOSIS — C9200 Acute myeloblastic leukemia, not having achieved remission: Secondary | ICD-10-CM | POA: Diagnosis not present

## 2021-11-24 DIAGNOSIS — Z86718 Personal history of other venous thrombosis and embolism: Secondary | ICD-10-CM | POA: Diagnosis not present

## 2021-11-24 DIAGNOSIS — Z7901 Long term (current) use of anticoagulants: Secondary | ICD-10-CM | POA: Diagnosis not present

## 2021-11-25 DIAGNOSIS — C9201 Acute myeloblastic leukemia, in remission: Secondary | ICD-10-CM | POA: Diagnosis not present

## 2021-11-25 DIAGNOSIS — D45 Polycythemia vera: Secondary | ICD-10-CM | POA: Diagnosis not present

## 2021-11-25 DIAGNOSIS — Z5111 Encounter for antineoplastic chemotherapy: Secondary | ICD-10-CM | POA: Diagnosis not present

## 2021-11-25 DIAGNOSIS — Z95828 Presence of other vascular implants and grafts: Secondary | ICD-10-CM | POA: Diagnosis not present

## 2021-11-26 DIAGNOSIS — C9201 Acute myeloblastic leukemia, in remission: Secondary | ICD-10-CM | POA: Diagnosis not present

## 2021-11-26 DIAGNOSIS — D45 Polycythemia vera: Secondary | ICD-10-CM | POA: Diagnosis not present

## 2021-11-26 DIAGNOSIS — Z95828 Presence of other vascular implants and grafts: Secondary | ICD-10-CM | POA: Diagnosis not present

## 2021-11-26 DIAGNOSIS — Z5111 Encounter for antineoplastic chemotherapy: Secondary | ICD-10-CM | POA: Diagnosis not present

## 2021-11-27 DIAGNOSIS — D45 Polycythemia vera: Secondary | ICD-10-CM | POA: Diagnosis not present

## 2021-11-27 DIAGNOSIS — Z5111 Encounter for antineoplastic chemotherapy: Secondary | ICD-10-CM | POA: Diagnosis not present

## 2021-11-27 DIAGNOSIS — Z95828 Presence of other vascular implants and grafts: Secondary | ICD-10-CM | POA: Diagnosis not present

## 2021-11-27 DIAGNOSIS — C9201 Acute myeloblastic leukemia, in remission: Secondary | ICD-10-CM | POA: Diagnosis not present

## 2021-11-28 DIAGNOSIS — Z95828 Presence of other vascular implants and grafts: Secondary | ICD-10-CM | POA: Diagnosis not present

## 2021-11-28 DIAGNOSIS — D45 Polycythemia vera: Secondary | ICD-10-CM | POA: Diagnosis not present

## 2021-11-28 DIAGNOSIS — C9201 Acute myeloblastic leukemia, in remission: Secondary | ICD-10-CM | POA: Diagnosis not present

## 2021-12-01 DIAGNOSIS — C9201 Acute myeloblastic leukemia, in remission: Secondary | ICD-10-CM | POA: Diagnosis not present

## 2021-12-01 DIAGNOSIS — D45 Polycythemia vera: Secondary | ICD-10-CM | POA: Diagnosis not present

## 2021-12-01 DIAGNOSIS — R7989 Other specified abnormal findings of blood chemistry: Secondary | ICD-10-CM | POA: Diagnosis not present

## 2021-12-01 DIAGNOSIS — Z95828 Presence of other vascular implants and grafts: Secondary | ICD-10-CM | POA: Diagnosis not present

## 2021-12-04 DIAGNOSIS — R7989 Other specified abnormal findings of blood chemistry: Secondary | ICD-10-CM | POA: Diagnosis not present

## 2021-12-04 DIAGNOSIS — C9201 Acute myeloblastic leukemia, in remission: Secondary | ICD-10-CM | POA: Diagnosis not present

## 2021-12-04 DIAGNOSIS — D45 Polycythemia vera: Secondary | ICD-10-CM | POA: Diagnosis not present

## 2021-12-04 DIAGNOSIS — Z95828 Presence of other vascular implants and grafts: Secondary | ICD-10-CM | POA: Diagnosis not present

## 2021-12-08 DIAGNOSIS — Z95828 Presence of other vascular implants and grafts: Secondary | ICD-10-CM | POA: Diagnosis not present

## 2021-12-08 DIAGNOSIS — D45 Polycythemia vera: Secondary | ICD-10-CM | POA: Diagnosis not present

## 2021-12-08 DIAGNOSIS — R7989 Other specified abnormal findings of blood chemistry: Secondary | ICD-10-CM | POA: Diagnosis not present

## 2021-12-08 DIAGNOSIS — C9201 Acute myeloblastic leukemia, in remission: Secondary | ICD-10-CM | POA: Diagnosis not present

## 2021-12-11 DIAGNOSIS — R7989 Other specified abnormal findings of blood chemistry: Secondary | ICD-10-CM | POA: Diagnosis not present

## 2021-12-11 DIAGNOSIS — C9201 Acute myeloblastic leukemia, in remission: Secondary | ICD-10-CM | POA: Diagnosis not present

## 2021-12-11 DIAGNOSIS — Z95828 Presence of other vascular implants and grafts: Secondary | ICD-10-CM | POA: Diagnosis not present

## 2021-12-11 DIAGNOSIS — D45 Polycythemia vera: Secondary | ICD-10-CM | POA: Diagnosis not present

## 2021-12-15 DIAGNOSIS — Z95828 Presence of other vascular implants and grafts: Secondary | ICD-10-CM | POA: Diagnosis not present

## 2021-12-15 DIAGNOSIS — R7989 Other specified abnormal findings of blood chemistry: Secondary | ICD-10-CM | POA: Diagnosis not present

## 2021-12-15 DIAGNOSIS — C9201 Acute myeloblastic leukemia, in remission: Secondary | ICD-10-CM | POA: Diagnosis not present

## 2021-12-15 DIAGNOSIS — D45 Polycythemia vera: Secondary | ICD-10-CM | POA: Diagnosis not present

## 2021-12-18 DIAGNOSIS — D45 Polycythemia vera: Secondary | ICD-10-CM | POA: Diagnosis not present

## 2021-12-18 DIAGNOSIS — C9201 Acute myeloblastic leukemia, in remission: Secondary | ICD-10-CM | POA: Diagnosis not present

## 2021-12-22 DIAGNOSIS — E883 Tumor lysis syndrome: Secondary | ICD-10-CM | POA: Diagnosis not present

## 2021-12-22 DIAGNOSIS — R188 Other ascites: Secondary | ICD-10-CM | POA: Diagnosis not present

## 2021-12-22 DIAGNOSIS — Z86718 Personal history of other venous thrombosis and embolism: Secondary | ICD-10-CM | POA: Diagnosis not present

## 2021-12-22 DIAGNOSIS — D630 Anemia in neoplastic disease: Secondary | ICD-10-CM | POA: Diagnosis not present

## 2021-12-22 DIAGNOSIS — Z955 Presence of coronary angioplasty implant and graft: Secondary | ICD-10-CM | POA: Diagnosis not present

## 2021-12-22 DIAGNOSIS — D45 Polycythemia vera: Secondary | ICD-10-CM | POA: Diagnosis not present

## 2021-12-22 DIAGNOSIS — D696 Thrombocytopenia, unspecified: Secondary | ICD-10-CM | POA: Diagnosis not present

## 2021-12-22 DIAGNOSIS — J90 Pleural effusion, not elsewhere classified: Secondary | ICD-10-CM | POA: Diagnosis not present

## 2021-12-22 DIAGNOSIS — K7460 Unspecified cirrhosis of liver: Secondary | ICD-10-CM | POA: Diagnosis not present

## 2021-12-22 DIAGNOSIS — R600 Localized edema: Secondary | ICD-10-CM | POA: Diagnosis not present

## 2021-12-22 DIAGNOSIS — M720 Palmar fascial fibromatosis [Dupuytren]: Secondary | ICD-10-CM | POA: Diagnosis not present

## 2021-12-22 DIAGNOSIS — D709 Neutropenia, unspecified: Secondary | ICD-10-CM | POA: Diagnosis not present

## 2021-12-22 DIAGNOSIS — D61818 Other pancytopenia: Secondary | ICD-10-CM | POA: Diagnosis not present

## 2021-12-22 DIAGNOSIS — C9000 Multiple myeloma not having achieved remission: Secondary | ICD-10-CM | POA: Diagnosis not present

## 2021-12-22 DIAGNOSIS — D471 Chronic myeloproliferative disease: Secondary | ICD-10-CM | POA: Diagnosis not present

## 2021-12-25 DIAGNOSIS — Z95828 Presence of other vascular implants and grafts: Secondary | ICD-10-CM | POA: Diagnosis not present

## 2021-12-25 DIAGNOSIS — C9000 Multiple myeloma not having achieved remission: Secondary | ICD-10-CM | POA: Diagnosis not present

## 2021-12-25 DIAGNOSIS — D45 Polycythemia vera: Secondary | ICD-10-CM | POA: Diagnosis not present

## 2021-12-29 DIAGNOSIS — D45 Polycythemia vera: Secondary | ICD-10-CM | POA: Diagnosis not present

## 2021-12-29 DIAGNOSIS — C9000 Multiple myeloma not having achieved remission: Secondary | ICD-10-CM | POA: Diagnosis not present

## 2021-12-29 DIAGNOSIS — Z79899 Other long term (current) drug therapy: Secondary | ICD-10-CM | POA: Diagnosis not present

## 2022-01-01 DIAGNOSIS — C9000 Multiple myeloma not having achieved remission: Secondary | ICD-10-CM | POA: Diagnosis not present

## 2022-01-01 DIAGNOSIS — Z79899 Other long term (current) drug therapy: Secondary | ICD-10-CM | POA: Diagnosis not present

## 2022-01-01 DIAGNOSIS — D45 Polycythemia vera: Secondary | ICD-10-CM | POA: Diagnosis not present

## 2022-01-05 DIAGNOSIS — D45 Polycythemia vera: Secondary | ICD-10-CM | POA: Diagnosis not present

## 2022-01-05 DIAGNOSIS — Z79899 Other long term (current) drug therapy: Secondary | ICD-10-CM | POA: Diagnosis not present

## 2022-01-05 DIAGNOSIS — Z95828 Presence of other vascular implants and grafts: Secondary | ICD-10-CM | POA: Diagnosis not present

## 2022-01-05 DIAGNOSIS — C9200 Acute myeloblastic leukemia, not having achieved remission: Secondary | ICD-10-CM | POA: Diagnosis not present

## 2022-01-08 DIAGNOSIS — Z95828 Presence of other vascular implants and grafts: Secondary | ICD-10-CM | POA: Diagnosis not present

## 2022-01-08 DIAGNOSIS — C9200 Acute myeloblastic leukemia, not having achieved remission: Secondary | ICD-10-CM | POA: Diagnosis not present

## 2022-01-08 DIAGNOSIS — D45 Polycythemia vera: Secondary | ICD-10-CM | POA: Diagnosis not present

## 2022-01-12 DIAGNOSIS — Z95828 Presence of other vascular implants and grafts: Secondary | ICD-10-CM | POA: Diagnosis not present

## 2022-01-12 DIAGNOSIS — C9200 Acute myeloblastic leukemia, not having achieved remission: Secondary | ICD-10-CM | POA: Diagnosis not present

## 2022-01-12 DIAGNOSIS — Z79899 Other long term (current) drug therapy: Secondary | ICD-10-CM | POA: Diagnosis not present

## 2022-01-12 DIAGNOSIS — D45 Polycythemia vera: Secondary | ICD-10-CM | POA: Diagnosis not present

## 2022-01-15 DIAGNOSIS — D61818 Other pancytopenia: Secondary | ICD-10-CM | POA: Diagnosis not present

## 2022-01-15 DIAGNOSIS — D630 Anemia in neoplastic disease: Secondary | ICD-10-CM | POA: Diagnosis not present

## 2022-01-15 DIAGNOSIS — D471 Chronic myeloproliferative disease: Secondary | ICD-10-CM | POA: Diagnosis not present

## 2022-01-15 DIAGNOSIS — D696 Thrombocytopenia, unspecified: Secondary | ICD-10-CM | POA: Diagnosis not present

## 2022-01-15 DIAGNOSIS — Z959 Presence of cardiac and vascular implant and graft, unspecified: Secondary | ICD-10-CM | POA: Diagnosis not present

## 2022-01-15 DIAGNOSIS — D709 Neutropenia, unspecified: Secondary | ICD-10-CM | POA: Diagnosis not present

## 2022-01-15 DIAGNOSIS — D45 Polycythemia vera: Secondary | ICD-10-CM | POA: Diagnosis not present

## 2022-01-15 DIAGNOSIS — C9201 Acute myeloblastic leukemia, in remission: Secondary | ICD-10-CM | POA: Diagnosis not present

## 2022-01-15 DIAGNOSIS — M720 Palmar fascial fibromatosis [Dupuytren]: Secondary | ICD-10-CM | POA: Diagnosis not present

## 2022-01-15 DIAGNOSIS — Z86718 Personal history of other venous thrombosis and embolism: Secondary | ICD-10-CM | POA: Diagnosis not present

## 2022-01-15 DIAGNOSIS — R600 Localized edema: Secondary | ICD-10-CM | POA: Diagnosis not present

## 2022-01-15 DIAGNOSIS — R188 Other ascites: Secondary | ICD-10-CM | POA: Diagnosis not present

## 2022-01-15 DIAGNOSIS — J90 Pleural effusion, not elsewhere classified: Secondary | ICD-10-CM | POA: Diagnosis not present

## 2022-01-15 DIAGNOSIS — C9200 Acute myeloblastic leukemia, not having achieved remission: Secondary | ICD-10-CM | POA: Diagnosis not present

## 2022-01-15 DIAGNOSIS — K7460 Unspecified cirrhosis of liver: Secondary | ICD-10-CM | POA: Diagnosis not present

## 2022-01-20 DIAGNOSIS — Z95828 Presence of other vascular implants and grafts: Secondary | ICD-10-CM | POA: Diagnosis not present

## 2022-01-20 DIAGNOSIS — Z79899 Other long term (current) drug therapy: Secondary | ICD-10-CM | POA: Diagnosis not present

## 2022-01-20 DIAGNOSIS — C9200 Acute myeloblastic leukemia, not having achieved remission: Secondary | ICD-10-CM | POA: Diagnosis not present

## 2022-01-20 DIAGNOSIS — D45 Polycythemia vera: Secondary | ICD-10-CM | POA: Diagnosis not present

## 2022-01-23 DIAGNOSIS — D45 Polycythemia vera: Secondary | ICD-10-CM | POA: Diagnosis not present

## 2022-01-23 DIAGNOSIS — Z95828 Presence of other vascular implants and grafts: Secondary | ICD-10-CM | POA: Diagnosis not present

## 2022-01-23 DIAGNOSIS — Z79899 Other long term (current) drug therapy: Secondary | ICD-10-CM | POA: Diagnosis not present

## 2022-01-23 DIAGNOSIS — C9200 Acute myeloblastic leukemia, not having achieved remission: Secondary | ICD-10-CM | POA: Diagnosis not present

## 2022-01-26 DIAGNOSIS — Z95828 Presence of other vascular implants and grafts: Secondary | ICD-10-CM | POA: Diagnosis not present

## 2022-01-26 DIAGNOSIS — D45 Polycythemia vera: Secondary | ICD-10-CM | POA: Diagnosis not present

## 2022-01-26 DIAGNOSIS — C9200 Acute myeloblastic leukemia, not having achieved remission: Secondary | ICD-10-CM | POA: Diagnosis not present

## 2022-01-26 DIAGNOSIS — C9201 Acute myeloblastic leukemia, in remission: Secondary | ICD-10-CM | POA: Diagnosis not present

## 2022-01-26 DIAGNOSIS — Z79899 Other long term (current) drug therapy: Secondary | ICD-10-CM | POA: Diagnosis not present

## 2022-01-30 DIAGNOSIS — D630 Anemia in neoplastic disease: Secondary | ICD-10-CM | POA: Diagnosis not present

## 2022-01-30 DIAGNOSIS — C9200 Acute myeloblastic leukemia, not having achieved remission: Secondary | ICD-10-CM | POA: Diagnosis not present

## 2022-01-30 DIAGNOSIS — D45 Polycythemia vera: Secondary | ICD-10-CM | POA: Diagnosis not present

## 2022-01-30 DIAGNOSIS — R188 Other ascites: Secondary | ICD-10-CM | POA: Diagnosis not present

## 2022-01-30 DIAGNOSIS — J90 Pleural effusion, not elsewhere classified: Secondary | ICD-10-CM | POA: Diagnosis not present

## 2022-01-30 DIAGNOSIS — K7460 Unspecified cirrhosis of liver: Secondary | ICD-10-CM | POA: Diagnosis not present

## 2022-01-30 DIAGNOSIS — D709 Neutropenia, unspecified: Secondary | ICD-10-CM | POA: Diagnosis not present

## 2022-01-30 DIAGNOSIS — R7989 Other specified abnormal findings of blood chemistry: Secondary | ICD-10-CM | POA: Diagnosis not present

## 2022-01-30 DIAGNOSIS — D61818 Other pancytopenia: Secondary | ICD-10-CM | POA: Diagnosis not present

## 2022-01-30 DIAGNOSIS — C9201 Acute myeloblastic leukemia, in remission: Secondary | ICD-10-CM | POA: Diagnosis not present

## 2022-01-30 DIAGNOSIS — D696 Thrombocytopenia, unspecified: Secondary | ICD-10-CM | POA: Diagnosis not present

## 2022-02-02 DIAGNOSIS — C9200 Acute myeloblastic leukemia, not having achieved remission: Secondary | ICD-10-CM | POA: Diagnosis not present

## 2022-02-02 DIAGNOSIS — D45 Polycythemia vera: Secondary | ICD-10-CM | POA: Diagnosis not present

## 2022-02-02 DIAGNOSIS — K7460 Unspecified cirrhosis of liver: Secondary | ICD-10-CM | POA: Diagnosis not present

## 2022-02-02 DIAGNOSIS — J90 Pleural effusion, not elsewhere classified: Secondary | ICD-10-CM | POA: Diagnosis not present

## 2022-02-02 DIAGNOSIS — C9201 Acute myeloblastic leukemia, in remission: Secondary | ICD-10-CM | POA: Diagnosis not present

## 2022-02-02 DIAGNOSIS — D61818 Other pancytopenia: Secondary | ICD-10-CM | POA: Diagnosis not present

## 2022-02-02 DIAGNOSIS — Z9221 Personal history of antineoplastic chemotherapy: Secondary | ICD-10-CM | POA: Diagnosis not present

## 2022-02-02 DIAGNOSIS — R7989 Other specified abnormal findings of blood chemistry: Secondary | ICD-10-CM | POA: Diagnosis not present

## 2022-02-02 DIAGNOSIS — R600 Localized edema: Secondary | ICD-10-CM | POA: Diagnosis not present

## 2022-02-02 DIAGNOSIS — R609 Edema, unspecified: Secondary | ICD-10-CM | POA: Diagnosis not present

## 2022-02-02 DIAGNOSIS — Z7969 Long term (current) use of other immunomodulators and immunosuppressants: Secondary | ICD-10-CM | POA: Diagnosis not present

## 2022-02-02 DIAGNOSIS — D709 Neutropenia, unspecified: Secondary | ICD-10-CM | POA: Diagnosis not present

## 2022-02-05 DIAGNOSIS — D45 Polycythemia vera: Secondary | ICD-10-CM | POA: Diagnosis not present

## 2022-02-05 DIAGNOSIS — C9200 Acute myeloblastic leukemia, not having achieved remission: Secondary | ICD-10-CM | POA: Diagnosis not present

## 2022-02-05 DIAGNOSIS — Z95828 Presence of other vascular implants and grafts: Secondary | ICD-10-CM | POA: Diagnosis not present

## 2022-02-09 DIAGNOSIS — Z95828 Presence of other vascular implants and grafts: Secondary | ICD-10-CM | POA: Diagnosis not present

## 2022-02-09 DIAGNOSIS — C9200 Acute myeloblastic leukemia, not having achieved remission: Secondary | ICD-10-CM | POA: Diagnosis not present

## 2022-02-09 DIAGNOSIS — D45 Polycythemia vera: Secondary | ICD-10-CM | POA: Diagnosis not present

## 2022-02-12 DIAGNOSIS — Z95828 Presence of other vascular implants and grafts: Secondary | ICD-10-CM | POA: Diagnosis not present

## 2022-02-12 DIAGNOSIS — Z5111 Encounter for antineoplastic chemotherapy: Secondary | ICD-10-CM | POA: Diagnosis not present

## 2022-02-12 DIAGNOSIS — D45 Polycythemia vera: Secondary | ICD-10-CM | POA: Diagnosis not present

## 2022-02-12 DIAGNOSIS — C9200 Acute myeloblastic leukemia, not having achieved remission: Secondary | ICD-10-CM | POA: Diagnosis not present

## 2022-02-16 DIAGNOSIS — M720 Palmar fascial fibromatosis [Dupuytren]: Secondary | ICD-10-CM | POA: Diagnosis not present

## 2022-02-16 DIAGNOSIS — Z7969 Long term (current) use of other immunomodulators and immunosuppressants: Secondary | ICD-10-CM | POA: Diagnosis not present

## 2022-02-16 DIAGNOSIS — D709 Neutropenia, unspecified: Secondary | ICD-10-CM | POA: Diagnosis not present

## 2022-02-16 DIAGNOSIS — R600 Localized edema: Secondary | ICD-10-CM | POA: Diagnosis not present

## 2022-02-16 DIAGNOSIS — C9200 Acute myeloblastic leukemia, not having achieved remission: Secondary | ICD-10-CM | POA: Diagnosis not present

## 2022-02-16 DIAGNOSIS — K769 Liver disease, unspecified: Secondary | ICD-10-CM | POA: Diagnosis not present

## 2022-02-16 DIAGNOSIS — D61818 Other pancytopenia: Secondary | ICD-10-CM | POA: Diagnosis not present

## 2022-02-16 DIAGNOSIS — D45 Polycythemia vera: Secondary | ICD-10-CM | POA: Diagnosis not present

## 2022-02-16 DIAGNOSIS — L989 Disorder of the skin and subcutaneous tissue, unspecified: Secondary | ICD-10-CM | POA: Diagnosis not present

## 2022-02-16 DIAGNOSIS — Z95828 Presence of other vascular implants and grafts: Secondary | ICD-10-CM | POA: Diagnosis not present

## 2022-02-16 DIAGNOSIS — K7460 Unspecified cirrhosis of liver: Secondary | ICD-10-CM | POA: Diagnosis not present

## 2022-02-16 DIAGNOSIS — K219 Gastro-esophageal reflux disease without esophagitis: Secondary | ICD-10-CM | POA: Diagnosis not present

## 2022-02-16 DIAGNOSIS — D471 Chronic myeloproliferative disease: Secondary | ICD-10-CM | POA: Diagnosis not present

## 2022-02-16 DIAGNOSIS — I272 Pulmonary hypertension, unspecified: Secondary | ICD-10-CM | POA: Diagnosis not present

## 2022-02-16 DIAGNOSIS — Z5111 Encounter for antineoplastic chemotherapy: Secondary | ICD-10-CM | POA: Diagnosis not present

## 2022-02-16 DIAGNOSIS — Z86718 Personal history of other venous thrombosis and embolism: Secondary | ICD-10-CM | POA: Diagnosis not present

## 2022-02-17 DIAGNOSIS — K8020 Calculus of gallbladder without cholecystitis without obstruction: Secondary | ICD-10-CM | POA: Diagnosis not present

## 2022-02-18 DIAGNOSIS — Z5111 Encounter for antineoplastic chemotherapy: Secondary | ICD-10-CM | POA: Diagnosis not present

## 2022-02-18 DIAGNOSIS — Z95828 Presence of other vascular implants and grafts: Secondary | ICD-10-CM | POA: Diagnosis not present

## 2022-02-18 DIAGNOSIS — C9200 Acute myeloblastic leukemia, not having achieved remission: Secondary | ICD-10-CM | POA: Diagnosis not present

## 2022-02-19 DIAGNOSIS — Z95828 Presence of other vascular implants and grafts: Secondary | ICD-10-CM | POA: Diagnosis not present

## 2022-02-19 DIAGNOSIS — D45 Polycythemia vera: Secondary | ICD-10-CM | POA: Diagnosis not present

## 2022-02-19 DIAGNOSIS — Z5111 Encounter for antineoplastic chemotherapy: Secondary | ICD-10-CM | POA: Diagnosis not present

## 2022-02-19 DIAGNOSIS — C9200 Acute myeloblastic leukemia, not having achieved remission: Secondary | ICD-10-CM | POA: Diagnosis not present

## 2022-02-20 DIAGNOSIS — C9200 Acute myeloblastic leukemia, not having achieved remission: Secondary | ICD-10-CM | POA: Diagnosis not present

## 2022-02-20 DIAGNOSIS — Z5111 Encounter for antineoplastic chemotherapy: Secondary | ICD-10-CM | POA: Diagnosis not present

## 2022-02-20 DIAGNOSIS — Z95828 Presence of other vascular implants and grafts: Secondary | ICD-10-CM | POA: Diagnosis not present

## 2022-02-23 DIAGNOSIS — Z5111 Encounter for antineoplastic chemotherapy: Secondary | ICD-10-CM | POA: Diagnosis not present

## 2022-02-23 DIAGNOSIS — D45 Polycythemia vera: Secondary | ICD-10-CM | POA: Diagnosis not present

## 2022-02-23 DIAGNOSIS — C9200 Acute myeloblastic leukemia, not having achieved remission: Secondary | ICD-10-CM | POA: Diagnosis not present

## 2022-02-23 DIAGNOSIS — Z95828 Presence of other vascular implants and grafts: Secondary | ICD-10-CM | POA: Diagnosis not present

## 2022-02-26 DIAGNOSIS — C9200 Acute myeloblastic leukemia, not having achieved remission: Secondary | ICD-10-CM | POA: Diagnosis not present

## 2022-02-26 DIAGNOSIS — Z95828 Presence of other vascular implants and grafts: Secondary | ICD-10-CM | POA: Diagnosis not present

## 2022-02-26 DIAGNOSIS — D45 Polycythemia vera: Secondary | ICD-10-CM | POA: Diagnosis not present

## 2022-03-02 DIAGNOSIS — D45 Polycythemia vera: Secondary | ICD-10-CM | POA: Diagnosis not present

## 2022-03-02 DIAGNOSIS — Z95828 Presence of other vascular implants and grafts: Secondary | ICD-10-CM | POA: Diagnosis not present

## 2022-03-02 DIAGNOSIS — R7989 Other specified abnormal findings of blood chemistry: Secondary | ICD-10-CM | POA: Diagnosis not present

## 2022-03-02 DIAGNOSIS — C9200 Acute myeloblastic leukemia, not having achieved remission: Secondary | ICD-10-CM | POA: Diagnosis not present

## 2022-03-05 DIAGNOSIS — Z95828 Presence of other vascular implants and grafts: Secondary | ICD-10-CM | POA: Diagnosis not present

## 2022-03-05 DIAGNOSIS — D45 Polycythemia vera: Secondary | ICD-10-CM | POA: Diagnosis not present

## 2022-03-05 DIAGNOSIS — Z79899 Other long term (current) drug therapy: Secondary | ICD-10-CM | POA: Diagnosis not present

## 2022-03-05 DIAGNOSIS — C9200 Acute myeloblastic leukemia, not having achieved remission: Secondary | ICD-10-CM | POA: Diagnosis not present

## 2022-03-09 DIAGNOSIS — D45 Polycythemia vera: Secondary | ICD-10-CM | POA: Diagnosis not present

## 2022-03-09 DIAGNOSIS — Z79899 Other long term (current) drug therapy: Secondary | ICD-10-CM | POA: Diagnosis not present

## 2022-03-09 DIAGNOSIS — Z95828 Presence of other vascular implants and grafts: Secondary | ICD-10-CM | POA: Diagnosis not present

## 2022-03-09 DIAGNOSIS — C9200 Acute myeloblastic leukemia, not having achieved remission: Secondary | ICD-10-CM | POA: Diagnosis not present

## 2022-03-12 DIAGNOSIS — D45 Polycythemia vera: Secondary | ICD-10-CM | POA: Diagnosis not present

## 2022-03-12 DIAGNOSIS — Z95828 Presence of other vascular implants and grafts: Secondary | ICD-10-CM | POA: Diagnosis not present

## 2022-03-12 DIAGNOSIS — C9200 Acute myeloblastic leukemia, not having achieved remission: Secondary | ICD-10-CM | POA: Diagnosis not present

## 2022-03-16 DIAGNOSIS — D61818 Other pancytopenia: Secondary | ICD-10-CM | POA: Diagnosis not present

## 2022-03-16 DIAGNOSIS — Z86718 Personal history of other venous thrombosis and embolism: Secondary | ICD-10-CM | POA: Diagnosis not present

## 2022-03-16 DIAGNOSIS — I272 Pulmonary hypertension, unspecified: Secondary | ICD-10-CM | POA: Diagnosis not present

## 2022-03-16 DIAGNOSIS — K7460 Unspecified cirrhosis of liver: Secondary | ICD-10-CM | POA: Diagnosis not present

## 2022-03-16 DIAGNOSIS — K769 Liver disease, unspecified: Secondary | ICD-10-CM | POA: Diagnosis not present

## 2022-03-16 DIAGNOSIS — D471 Chronic myeloproliferative disease: Secondary | ICD-10-CM | POA: Diagnosis not present

## 2022-03-16 DIAGNOSIS — C9200 Acute myeloblastic leukemia, not having achieved remission: Secondary | ICD-10-CM | POA: Diagnosis not present

## 2022-03-16 DIAGNOSIS — D45 Polycythemia vera: Secondary | ICD-10-CM | POA: Diagnosis not present

## 2022-03-16 DIAGNOSIS — M720 Palmar fascial fibromatosis [Dupuytren]: Secondary | ICD-10-CM | POA: Diagnosis not present

## 2022-03-16 DIAGNOSIS — D709 Neutropenia, unspecified: Secondary | ICD-10-CM | POA: Diagnosis not present

## 2022-03-16 DIAGNOSIS — L989 Disorder of the skin and subcutaneous tissue, unspecified: Secondary | ICD-10-CM | POA: Diagnosis not present

## 2022-03-16 DIAGNOSIS — D630 Anemia in neoplastic disease: Secondary | ICD-10-CM | POA: Diagnosis not present

## 2022-03-16 DIAGNOSIS — Z8739 Personal history of other diseases of the musculoskeletal system and connective tissue: Secondary | ICD-10-CM | POA: Diagnosis not present

## 2022-03-16 DIAGNOSIS — R600 Localized edema: Secondary | ICD-10-CM | POA: Diagnosis not present

## 2022-03-19 DIAGNOSIS — D61818 Other pancytopenia: Secondary | ICD-10-CM | POA: Diagnosis not present

## 2022-03-19 DIAGNOSIS — C9200 Acute myeloblastic leukemia, not having achieved remission: Secondary | ICD-10-CM | POA: Diagnosis not present

## 2022-03-19 DIAGNOSIS — Z95828 Presence of other vascular implants and grafts: Secondary | ICD-10-CM | POA: Diagnosis not present

## 2022-03-19 DIAGNOSIS — D45 Polycythemia vera: Secondary | ICD-10-CM | POA: Diagnosis not present

## 2022-03-23 DIAGNOSIS — Z95828 Presence of other vascular implants and grafts: Secondary | ICD-10-CM | POA: Diagnosis not present

## 2022-03-23 DIAGNOSIS — D45 Polycythemia vera: Secondary | ICD-10-CM | POA: Diagnosis not present

## 2022-03-23 DIAGNOSIS — C9200 Acute myeloblastic leukemia, not having achieved remission: Secondary | ICD-10-CM | POA: Diagnosis not present

## 2022-03-23 DIAGNOSIS — R7989 Other specified abnormal findings of blood chemistry: Secondary | ICD-10-CM | POA: Diagnosis not present

## 2022-03-23 DIAGNOSIS — Z79899 Other long term (current) drug therapy: Secondary | ICD-10-CM | POA: Diagnosis not present

## 2022-03-26 DIAGNOSIS — C9200 Acute myeloblastic leukemia, not having achieved remission: Secondary | ICD-10-CM | POA: Diagnosis not present

## 2022-03-26 DIAGNOSIS — R7989 Other specified abnormal findings of blood chemistry: Secondary | ICD-10-CM | POA: Diagnosis not present

## 2022-03-26 DIAGNOSIS — Z79899 Other long term (current) drug therapy: Secondary | ICD-10-CM | POA: Diagnosis not present

## 2022-03-26 DIAGNOSIS — D45 Polycythemia vera: Secondary | ICD-10-CM | POA: Diagnosis not present

## 2022-03-26 DIAGNOSIS — Z95828 Presence of other vascular implants and grafts: Secondary | ICD-10-CM | POA: Diagnosis not present

## 2022-03-30 ENCOUNTER — Ambulatory Visit: Payer: Self-pay

## 2022-03-30 DIAGNOSIS — D45 Polycythemia vera: Secondary | ICD-10-CM | POA: Diagnosis not present

## 2022-03-30 DIAGNOSIS — C9200 Acute myeloblastic leukemia, not having achieved remission: Secondary | ICD-10-CM | POA: Diagnosis not present

## 2022-03-30 DIAGNOSIS — Z95828 Presence of other vascular implants and grafts: Secondary | ICD-10-CM | POA: Diagnosis not present

## 2022-03-30 NOTE — Patient Outreach (Signed)
  Care Coordination   03/30/2022 Name: Benjamin [name] MRN: [id number] DOB: Feb 08, 1951   Care Coordination Outreach Attempts:  An unsuccessful telephone outreach was attempted today to offer the patient information about available care coordination services as a benefit of their health plan.   Follow Up Plan:  Additional outreach attempts will be made to offer the patient care coordination information and services.   Encounter Outcome:  No Answer  Care Coordination Interventions Activated:  No   Care Coordination Interventions:  No, not indicated    Daneen Schick, BSW, CDP Social Worker, Certified Dementia Practitioner Care Coordination [phone]

## 2022-04-02 DIAGNOSIS — D45 Polycythemia vera: Secondary | ICD-10-CM | POA: Diagnosis not present

## 2022-04-02 DIAGNOSIS — C9200 Acute myeloblastic leukemia, not having achieved remission: Secondary | ICD-10-CM | POA: Diagnosis not present

## 2022-04-02 DIAGNOSIS — Z95828 Presence of other vascular implants and grafts: Secondary | ICD-10-CM | POA: Diagnosis not present

## 2022-04-06 DIAGNOSIS — Z79899 Other long term (current) drug therapy: Secondary | ICD-10-CM | POA: Diagnosis not present

## 2022-04-06 DIAGNOSIS — C9200 Acute myeloblastic leukemia, not having achieved remission: Secondary | ICD-10-CM | POA: Diagnosis not present

## 2022-04-06 DIAGNOSIS — D45 Polycythemia vera: Secondary | ICD-10-CM | POA: Diagnosis not present

## 2022-04-06 DIAGNOSIS — D61818 Other pancytopenia: Secondary | ICD-10-CM | POA: Diagnosis not present

## 2022-04-06 DIAGNOSIS — Z95828 Presence of other vascular implants and grafts: Secondary | ICD-10-CM | POA: Diagnosis not present

## 2022-04-09 DIAGNOSIS — C9200 Acute myeloblastic leukemia, not having achieved remission: Secondary | ICD-10-CM | POA: Diagnosis not present

## 2022-04-09 DIAGNOSIS — D45 Polycythemia vera: Secondary | ICD-10-CM | POA: Diagnosis not present

## 2022-04-09 DIAGNOSIS — Z95828 Presence of other vascular implants and grafts: Secondary | ICD-10-CM | POA: Diagnosis not present

## 2022-04-09 DIAGNOSIS — Z79899 Other long term (current) drug therapy: Secondary | ICD-10-CM | POA: Diagnosis not present

## 2022-04-13 DIAGNOSIS — M720 Palmar fascial fibromatosis [Dupuytren]: Secondary | ICD-10-CM | POA: Diagnosis not present

## 2022-04-13 DIAGNOSIS — C9200 Acute myeloblastic leukemia, not having achieved remission: Secondary | ICD-10-CM | POA: Diagnosis not present

## 2022-04-13 DIAGNOSIS — I272 Pulmonary hypertension, unspecified: Secondary | ICD-10-CM | POA: Diagnosis not present

## 2022-04-13 DIAGNOSIS — K769 Liver disease, unspecified: Secondary | ICD-10-CM | POA: Diagnosis not present

## 2022-04-13 DIAGNOSIS — D61818 Other pancytopenia: Secondary | ICD-10-CM | POA: Diagnosis not present

## 2022-04-13 DIAGNOSIS — Z95828 Presence of other vascular implants and grafts: Secondary | ICD-10-CM | POA: Diagnosis not present

## 2022-04-13 DIAGNOSIS — R600 Localized edema: Secondary | ICD-10-CM | POA: Diagnosis not present

## 2022-04-13 DIAGNOSIS — L989 Disorder of the skin and subcutaneous tissue, unspecified: Secondary | ICD-10-CM | POA: Diagnosis not present

## 2022-04-13 DIAGNOSIS — D630 Anemia in neoplastic disease: Secondary | ICD-10-CM | POA: Diagnosis not present

## 2022-04-13 DIAGNOSIS — D709 Neutropenia, unspecified: Secondary | ICD-10-CM | POA: Diagnosis not present

## 2022-04-13 DIAGNOSIS — D45 Polycythemia vera: Secondary | ICD-10-CM | POA: Diagnosis not present

## 2022-04-16 DIAGNOSIS — D45 Polycythemia vera: Secondary | ICD-10-CM | POA: Diagnosis not present

## 2022-04-16 DIAGNOSIS — Z95828 Presence of other vascular implants and grafts: Secondary | ICD-10-CM | POA: Diagnosis not present

## 2022-04-16 DIAGNOSIS — Z79899 Other long term (current) drug therapy: Secondary | ICD-10-CM | POA: Diagnosis not present

## 2022-04-16 DIAGNOSIS — C9200 Acute myeloblastic leukemia, not having achieved remission: Secondary | ICD-10-CM | POA: Diagnosis not present

## 2022-04-17 DIAGNOSIS — C9200 Acute myeloblastic leukemia, not having achieved remission: Secondary | ICD-10-CM | POA: Diagnosis not present

## 2022-04-20 DIAGNOSIS — Z79899 Other long term (current) drug therapy: Secondary | ICD-10-CM | POA: Diagnosis not present

## 2022-04-20 DIAGNOSIS — C9200 Acute myeloblastic leukemia, not having achieved remission: Secondary | ICD-10-CM | POA: Diagnosis not present

## 2022-04-20 DIAGNOSIS — D45 Polycythemia vera: Secondary | ICD-10-CM | POA: Diagnosis not present

## 2022-04-20 DIAGNOSIS — Z95828 Presence of other vascular implants and grafts: Secondary | ICD-10-CM | POA: Diagnosis not present

## 2022-04-23 DIAGNOSIS — Z95828 Presence of other vascular implants and grafts: Secondary | ICD-10-CM | POA: Diagnosis not present

## 2022-04-23 DIAGNOSIS — Z79899 Other long term (current) drug therapy: Secondary | ICD-10-CM | POA: Diagnosis not present

## 2022-04-23 DIAGNOSIS — C9200 Acute myeloblastic leukemia, not having achieved remission: Secondary | ICD-10-CM | POA: Diagnosis not present

## 2022-04-23 DIAGNOSIS — D45 Polycythemia vera: Secondary | ICD-10-CM | POA: Diagnosis not present

## 2022-04-29 DIAGNOSIS — D45 Polycythemia vera: Secondary | ICD-10-CM | POA: Diagnosis not present

## 2022-04-29 DIAGNOSIS — C9200 Acute myeloblastic leukemia, not having achieved remission: Secondary | ICD-10-CM | POA: Diagnosis not present

## 2022-04-29 DIAGNOSIS — Z95828 Presence of other vascular implants and grafts: Secondary | ICD-10-CM | POA: Diagnosis not present

## 2022-04-29 DIAGNOSIS — Z79899 Other long term (current) drug therapy: Secondary | ICD-10-CM | POA: Diagnosis not present

## 2022-05-01 DIAGNOSIS — Z95828 Presence of other vascular implants and grafts: Secondary | ICD-10-CM | POA: Diagnosis not present

## 2022-05-01 DIAGNOSIS — Z79899 Other long term (current) drug therapy: Secondary | ICD-10-CM | POA: Diagnosis not present

## 2022-05-01 DIAGNOSIS — C9200 Acute myeloblastic leukemia, not having achieved remission: Secondary | ICD-10-CM | POA: Diagnosis not present

## 2022-05-01 DIAGNOSIS — D45 Polycythemia vera: Secondary | ICD-10-CM | POA: Diagnosis not present

## 2022-05-04 DIAGNOSIS — D709 Neutropenia, unspecified: Secondary | ICD-10-CM | POA: Diagnosis not present

## 2022-05-04 DIAGNOSIS — D61818 Other pancytopenia: Secondary | ICD-10-CM | POA: Diagnosis not present

## 2022-05-04 DIAGNOSIS — J90 Pleural effusion, not elsewhere classified: Secondary | ICD-10-CM | POA: Diagnosis not present

## 2022-05-04 DIAGNOSIS — D696 Thrombocytopenia, unspecified: Secondary | ICD-10-CM | POA: Diagnosis not present

## 2022-05-04 DIAGNOSIS — K7460 Unspecified cirrhosis of liver: Secondary | ICD-10-CM | POA: Diagnosis not present

## 2022-05-04 DIAGNOSIS — M720 Palmar fascial fibromatosis [Dupuytren]: Secondary | ICD-10-CM | POA: Diagnosis not present

## 2022-05-04 DIAGNOSIS — Z7901 Long term (current) use of anticoagulants: Secondary | ICD-10-CM | POA: Diagnosis not present

## 2022-05-04 DIAGNOSIS — Z86718 Personal history of other venous thrombosis and embolism: Secondary | ICD-10-CM | POA: Diagnosis not present

## 2022-05-04 DIAGNOSIS — Z8719 Personal history of other diseases of the digestive system: Secondary | ICD-10-CM | POA: Diagnosis not present

## 2022-05-04 DIAGNOSIS — K769 Liver disease, unspecified: Secondary | ICD-10-CM | POA: Diagnosis not present

## 2022-05-04 DIAGNOSIS — L989 Disorder of the skin and subcutaneous tissue, unspecified: Secondary | ICD-10-CM | POA: Diagnosis not present

## 2022-05-04 DIAGNOSIS — R600 Localized edema: Secondary | ICD-10-CM | POA: Diagnosis not present

## 2022-05-04 DIAGNOSIS — I272 Pulmonary hypertension, unspecified: Secondary | ICD-10-CM | POA: Diagnosis not present

## 2022-05-04 DIAGNOSIS — D471 Chronic myeloproliferative disease: Secondary | ICD-10-CM | POA: Diagnosis not present

## 2022-05-04 DIAGNOSIS — C9200 Acute myeloblastic leukemia, not having achieved remission: Secondary | ICD-10-CM | POA: Diagnosis not present

## 2022-05-07 DIAGNOSIS — D45 Polycythemia vera: Secondary | ICD-10-CM | POA: Diagnosis not present

## 2022-05-07 DIAGNOSIS — Z79899 Other long term (current) drug therapy: Secondary | ICD-10-CM | POA: Diagnosis not present

## 2022-05-07 DIAGNOSIS — Z95828 Presence of other vascular implants and grafts: Secondary | ICD-10-CM | POA: Diagnosis not present

## 2022-05-07 DIAGNOSIS — C9200 Acute myeloblastic leukemia, not having achieved remission: Secondary | ICD-10-CM | POA: Diagnosis not present

## 2022-05-11 DIAGNOSIS — R7989 Other specified abnormal findings of blood chemistry: Secondary | ICD-10-CM | POA: Diagnosis not present

## 2022-05-11 DIAGNOSIS — D45 Polycythemia vera: Secondary | ICD-10-CM | POA: Diagnosis not present

## 2022-05-11 DIAGNOSIS — Z95828 Presence of other vascular implants and grafts: Secondary | ICD-10-CM | POA: Diagnosis not present

## 2022-05-11 DIAGNOSIS — C9200 Acute myeloblastic leukemia, not having achieved remission: Secondary | ICD-10-CM | POA: Diagnosis not present

## 2022-05-14 DIAGNOSIS — D45 Polycythemia vera: Secondary | ICD-10-CM | POA: Diagnosis not present

## 2022-05-14 DIAGNOSIS — C9200 Acute myeloblastic leukemia, not having achieved remission: Secondary | ICD-10-CM | POA: Diagnosis not present

## 2022-05-18 DIAGNOSIS — Z86718 Personal history of other venous thrombosis and embolism: Secondary | ICD-10-CM | POA: Diagnosis not present

## 2022-05-18 DIAGNOSIS — R7989 Other specified abnormal findings of blood chemistry: Secondary | ICD-10-CM | POA: Diagnosis not present

## 2022-05-18 DIAGNOSIS — I81 Portal vein thrombosis: Secondary | ICD-10-CM | POA: Diagnosis not present

## 2022-05-18 DIAGNOSIS — D709 Neutropenia, unspecified: Secondary | ICD-10-CM | POA: Diagnosis not present

## 2022-05-18 DIAGNOSIS — D471 Chronic myeloproliferative disease: Secondary | ICD-10-CM | POA: Diagnosis not present

## 2022-05-18 DIAGNOSIS — L989 Disorder of the skin and subcutaneous tissue, unspecified: Secondary | ICD-10-CM | POA: Diagnosis not present

## 2022-05-18 DIAGNOSIS — R600 Localized edema: Secondary | ICD-10-CM | POA: Diagnosis not present

## 2022-05-18 DIAGNOSIS — K7460 Unspecified cirrhosis of liver: Secondary | ICD-10-CM | POA: Diagnosis not present

## 2022-05-18 DIAGNOSIS — C9200 Acute myeloblastic leukemia, not having achieved remission: Secondary | ICD-10-CM | POA: Diagnosis not present

## 2022-05-18 DIAGNOSIS — D45 Polycythemia vera: Secondary | ICD-10-CM | POA: Diagnosis not present

## 2022-05-18 DIAGNOSIS — D696 Thrombocytopenia, unspecified: Secondary | ICD-10-CM | POA: Diagnosis not present

## 2022-05-21 DIAGNOSIS — Z95828 Presence of other vascular implants and grafts: Secondary | ICD-10-CM | POA: Diagnosis not present

## 2022-05-21 DIAGNOSIS — C9200 Acute myeloblastic leukemia, not having achieved remission: Secondary | ICD-10-CM | POA: Diagnosis not present

## 2022-05-21 DIAGNOSIS — D45 Polycythemia vera: Secondary | ICD-10-CM | POA: Diagnosis not present

## 2022-05-25 DIAGNOSIS — C9200 Acute myeloblastic leukemia, not having achieved remission: Secondary | ICD-10-CM | POA: Diagnosis not present

## 2022-05-25 DIAGNOSIS — D45 Polycythemia vera: Secondary | ICD-10-CM | POA: Diagnosis not present

## 2022-05-25 DIAGNOSIS — R7989 Other specified abnormal findings of blood chemistry: Secondary | ICD-10-CM | POA: Diagnosis not present

## 2022-05-25 DIAGNOSIS — Z95828 Presence of other vascular implants and grafts: Secondary | ICD-10-CM | POA: Diagnosis not present

## 2022-05-29 DIAGNOSIS — Z95828 Presence of other vascular implants and grafts: Secondary | ICD-10-CM | POA: Diagnosis not present

## 2022-05-29 DIAGNOSIS — C9200 Acute myeloblastic leukemia, not having achieved remission: Secondary | ICD-10-CM | POA: Diagnosis not present

## 2022-05-29 DIAGNOSIS — D45 Polycythemia vera: Secondary | ICD-10-CM | POA: Diagnosis not present

## 2022-05-30 DIAGNOSIS — D45 Polycythemia vera: Secondary | ICD-10-CM | POA: Diagnosis not present

## 2022-05-30 DIAGNOSIS — C9200 Acute myeloblastic leukemia, not having achieved remission: Secondary | ICD-10-CM | POA: Diagnosis not present

## 2022-05-30 DIAGNOSIS — Z95828 Presence of other vascular implants and grafts: Secondary | ICD-10-CM | POA: Diagnosis not present

## 2022-06-01 DIAGNOSIS — Z95828 Presence of other vascular implants and grafts: Secondary | ICD-10-CM | POA: Diagnosis not present

## 2022-06-01 DIAGNOSIS — C9200 Acute myeloblastic leukemia, not having achieved remission: Secondary | ICD-10-CM | POA: Diagnosis not present

## 2022-06-01 DIAGNOSIS — D45 Polycythemia vera: Secondary | ICD-10-CM | POA: Diagnosis not present

## 2022-06-04 DIAGNOSIS — R7989 Other specified abnormal findings of blood chemistry: Secondary | ICD-10-CM | POA: Diagnosis not present

## 2022-06-04 DIAGNOSIS — C9200 Acute myeloblastic leukemia, not having achieved remission: Secondary | ICD-10-CM | POA: Diagnosis not present

## 2022-06-04 DIAGNOSIS — D45 Polycythemia vera: Secondary | ICD-10-CM | POA: Diagnosis not present

## 2022-06-04 DIAGNOSIS — Z95828 Presence of other vascular implants and grafts: Secondary | ICD-10-CM | POA: Diagnosis not present

## 2022-06-08 DIAGNOSIS — R7989 Other specified abnormal findings of blood chemistry: Secondary | ICD-10-CM | POA: Diagnosis not present

## 2022-06-08 DIAGNOSIS — Z95828 Presence of other vascular implants and grafts: Secondary | ICD-10-CM | POA: Diagnosis not present

## 2022-06-08 DIAGNOSIS — D45 Polycythemia vera: Secondary | ICD-10-CM | POA: Diagnosis not present

## 2022-06-08 DIAGNOSIS — C9200 Acute myeloblastic leukemia, not having achieved remission: Secondary | ICD-10-CM | POA: Diagnosis not present

## 2022-06-11 DIAGNOSIS — C9200 Acute myeloblastic leukemia, not having achieved remission: Secondary | ICD-10-CM | POA: Diagnosis not present

## 2022-06-11 DIAGNOSIS — R7989 Other specified abnormal findings of blood chemistry: Secondary | ICD-10-CM | POA: Diagnosis not present

## 2022-06-11 DIAGNOSIS — D45 Polycythemia vera: Secondary | ICD-10-CM | POA: Diagnosis not present

## 2022-06-11 DIAGNOSIS — Z95828 Presence of other vascular implants and grafts: Secondary | ICD-10-CM | POA: Diagnosis not present

## 2022-06-15 DIAGNOSIS — Z7969 Long term (current) use of other immunomodulators and immunosuppressants: Secondary | ICD-10-CM | POA: Diagnosis not present

## 2022-06-15 DIAGNOSIS — Z9889 Other specified postprocedural states: Secondary | ICD-10-CM | POA: Diagnosis not present

## 2022-06-15 DIAGNOSIS — Z789 Other specified health status: Secondary | ICD-10-CM | POA: Diagnosis not present

## 2022-06-15 DIAGNOSIS — Z8739 Personal history of other diseases of the musculoskeletal system and connective tissue: Secondary | ICD-10-CM | POA: Diagnosis not present

## 2022-06-15 DIAGNOSIS — D45 Polycythemia vera: Secondary | ICD-10-CM | POA: Diagnosis not present

## 2022-06-15 DIAGNOSIS — Z86718 Personal history of other venous thrombosis and embolism: Secondary | ICD-10-CM | POA: Diagnosis not present

## 2022-06-15 DIAGNOSIS — Z95828 Presence of other vascular implants and grafts: Secondary | ICD-10-CM | POA: Diagnosis not present

## 2022-06-15 DIAGNOSIS — D61818 Other pancytopenia: Secondary | ICD-10-CM | POA: Diagnosis not present

## 2022-06-15 DIAGNOSIS — K7460 Unspecified cirrhosis of liver: Secondary | ICD-10-CM | POA: Diagnosis not present

## 2022-06-15 DIAGNOSIS — C9200 Acute myeloblastic leukemia, not having achieved remission: Secondary | ICD-10-CM | POA: Diagnosis not present

## 2022-06-18 DIAGNOSIS — Z95828 Presence of other vascular implants and grafts: Secondary | ICD-10-CM | POA: Diagnosis not present

## 2022-06-18 DIAGNOSIS — C9200 Acute myeloblastic leukemia, not having achieved remission: Secondary | ICD-10-CM | POA: Diagnosis not present

## 2022-06-18 DIAGNOSIS — Z125 Encounter for screening for malignant neoplasm of prostate: Secondary | ICD-10-CM | POA: Diagnosis not present

## 2022-06-18 DIAGNOSIS — R7989 Other specified abnormal findings of blood chemistry: Secondary | ICD-10-CM | POA: Diagnosis not present

## 2022-06-18 DIAGNOSIS — D45 Polycythemia vera: Secondary | ICD-10-CM | POA: Diagnosis not present

## 2022-06-21 DIAGNOSIS — D45 Polycythemia vera: Secondary | ICD-10-CM | POA: Diagnosis not present

## 2022-06-21 DIAGNOSIS — C9200 Acute myeloblastic leukemia, not having achieved remission: Secondary | ICD-10-CM | POA: Diagnosis not present

## 2022-06-21 DIAGNOSIS — D61818 Other pancytopenia: Secondary | ICD-10-CM | POA: Diagnosis not present

## 2022-06-22 DIAGNOSIS — R7989 Other specified abnormal findings of blood chemistry: Secondary | ICD-10-CM | POA: Diagnosis not present

## 2022-06-22 DIAGNOSIS — C9200 Acute myeloblastic leukemia, not having achieved remission: Secondary | ICD-10-CM | POA: Diagnosis not present

## 2022-06-25 DIAGNOSIS — Z885 Allergy status to narcotic agent status: Secondary | ICD-10-CM | POA: Diagnosis not present

## 2022-06-25 DIAGNOSIS — K7460 Unspecified cirrhosis of liver: Secondary | ICD-10-CM | POA: Diagnosis not present

## 2022-06-25 DIAGNOSIS — D471 Chronic myeloproliferative disease: Secondary | ICD-10-CM | POA: Diagnosis not present

## 2022-06-25 DIAGNOSIS — Z86718 Personal history of other venous thrombosis and embolism: Secondary | ICD-10-CM | POA: Diagnosis not present

## 2022-06-25 DIAGNOSIS — R7989 Other specified abnormal findings of blood chemistry: Secondary | ICD-10-CM | POA: Diagnosis not present

## 2022-06-25 DIAGNOSIS — M720 Palmar fascial fibromatosis [Dupuytren]: Secondary | ICD-10-CM | POA: Diagnosis not present

## 2022-06-25 DIAGNOSIS — Z959 Presence of cardiac and vascular implant and graft, unspecified: Secondary | ICD-10-CM | POA: Diagnosis not present

## 2022-06-25 DIAGNOSIS — Z881 Allergy status to other antibiotic agents status: Secondary | ICD-10-CM | POA: Diagnosis not present

## 2022-06-25 DIAGNOSIS — R600 Localized edema: Secondary | ICD-10-CM | POA: Diagnosis not present

## 2022-06-25 DIAGNOSIS — C9200 Acute myeloblastic leukemia, not having achieved remission: Secondary | ICD-10-CM | POA: Diagnosis not present

## 2022-06-25 DIAGNOSIS — Z886 Allergy status to analgesic agent status: Secondary | ICD-10-CM | POA: Diagnosis not present

## 2022-06-29 DIAGNOSIS — C9200 Acute myeloblastic leukemia, not having achieved remission: Secondary | ICD-10-CM | POA: Diagnosis not present

## 2022-06-29 DIAGNOSIS — Z95828 Presence of other vascular implants and grafts: Secondary | ICD-10-CM | POA: Diagnosis not present

## 2022-06-29 DIAGNOSIS — Z5111 Encounter for antineoplastic chemotherapy: Secondary | ICD-10-CM | POA: Diagnosis not present

## 2022-06-29 DIAGNOSIS — D45 Polycythemia vera: Secondary | ICD-10-CM | POA: Diagnosis not present

## 2022-06-30 DIAGNOSIS — C9200 Acute myeloblastic leukemia, not having achieved remission: Secondary | ICD-10-CM | POA: Diagnosis not present

## 2022-06-30 DIAGNOSIS — Z5111 Encounter for antineoplastic chemotherapy: Secondary | ICD-10-CM | POA: Diagnosis not present

## 2022-06-30 DIAGNOSIS — Z95828 Presence of other vascular implants and grafts: Secondary | ICD-10-CM | POA: Diagnosis not present

## 2022-07-01 DIAGNOSIS — C9200 Acute myeloblastic leukemia, not having achieved remission: Secondary | ICD-10-CM | POA: Diagnosis not present

## 2022-07-01 DIAGNOSIS — Z95828 Presence of other vascular implants and grafts: Secondary | ICD-10-CM | POA: Diagnosis not present

## 2022-07-01 DIAGNOSIS — Z5111 Encounter for antineoplastic chemotherapy: Secondary | ICD-10-CM | POA: Diagnosis not present

## 2022-07-02 DIAGNOSIS — D45 Polycythemia vera: Secondary | ICD-10-CM | POA: Diagnosis not present

## 2022-07-02 DIAGNOSIS — Z95828 Presence of other vascular implants and grafts: Secondary | ICD-10-CM | POA: Diagnosis not present

## 2022-07-02 DIAGNOSIS — C9200 Acute myeloblastic leukemia, not having achieved remission: Secondary | ICD-10-CM | POA: Diagnosis not present

## 2022-07-02 DIAGNOSIS — Z5111 Encounter for antineoplastic chemotherapy: Secondary | ICD-10-CM | POA: Diagnosis not present

## 2022-07-03 DIAGNOSIS — C9200 Acute myeloblastic leukemia, not having achieved remission: Secondary | ICD-10-CM | POA: Diagnosis not present

## 2022-07-03 DIAGNOSIS — Z5111 Encounter for antineoplastic chemotherapy: Secondary | ICD-10-CM | POA: Diagnosis not present

## 2022-07-03 DIAGNOSIS — Z95828 Presence of other vascular implants and grafts: Secondary | ICD-10-CM | POA: Diagnosis not present

## 2022-07-06 DIAGNOSIS — C9200 Acute myeloblastic leukemia, not having achieved remission: Secondary | ICD-10-CM | POA: Diagnosis not present

## 2022-07-06 DIAGNOSIS — Z5111 Encounter for antineoplastic chemotherapy: Secondary | ICD-10-CM | POA: Diagnosis not present

## 2022-07-06 DIAGNOSIS — D45 Polycythemia vera: Secondary | ICD-10-CM | POA: Diagnosis not present

## 2022-07-06 DIAGNOSIS — Z95828 Presence of other vascular implants and grafts: Secondary | ICD-10-CM | POA: Diagnosis not present

## 2022-07-07 DIAGNOSIS — Z95828 Presence of other vascular implants and grafts: Secondary | ICD-10-CM | POA: Diagnosis not present

## 2022-07-07 DIAGNOSIS — C9200 Acute myeloblastic leukemia, not having achieved remission: Secondary | ICD-10-CM | POA: Diagnosis not present

## 2022-07-07 DIAGNOSIS — Z5111 Encounter for antineoplastic chemotherapy: Secondary | ICD-10-CM | POA: Diagnosis not present

## 2022-07-09 DIAGNOSIS — R7989 Other specified abnormal findings of blood chemistry: Secondary | ICD-10-CM | POA: Diagnosis not present

## 2022-07-09 DIAGNOSIS — Z95828 Presence of other vascular implants and grafts: Secondary | ICD-10-CM | POA: Diagnosis not present

## 2022-07-09 DIAGNOSIS — D45 Polycythemia vera: Secondary | ICD-10-CM | POA: Diagnosis not present

## 2022-07-09 DIAGNOSIS — C9200 Acute myeloblastic leukemia, not having achieved remission: Secondary | ICD-10-CM | POA: Diagnosis not present

## 2022-07-13 DIAGNOSIS — Z95828 Presence of other vascular implants and grafts: Secondary | ICD-10-CM | POA: Diagnosis not present

## 2022-07-13 DIAGNOSIS — R7989 Other specified abnormal findings of blood chemistry: Secondary | ICD-10-CM | POA: Diagnosis not present

## 2022-07-13 DIAGNOSIS — C6200 Malignant neoplasm of unspecified undescended testis: Secondary | ICD-10-CM | POA: Diagnosis not present

## 2022-07-13 DIAGNOSIS — D45 Polycythemia vera: Secondary | ICD-10-CM | POA: Diagnosis not present

## 2022-07-17 DIAGNOSIS — R7989 Other specified abnormal findings of blood chemistry: Secondary | ICD-10-CM | POA: Diagnosis not present

## 2022-07-17 DIAGNOSIS — Z95828 Presence of other vascular implants and grafts: Secondary | ICD-10-CM | POA: Diagnosis not present

## 2022-07-17 DIAGNOSIS — C9200 Acute myeloblastic leukemia, not having achieved remission: Secondary | ICD-10-CM | POA: Diagnosis not present

## 2022-07-17 DIAGNOSIS — D45 Polycythemia vera: Secondary | ICD-10-CM | POA: Diagnosis not present

## 2022-07-19 ENCOUNTER — Emergency Department (HOSPITAL_COMMUNITY): Payer: Medicare PPO

## 2022-07-19 ENCOUNTER — Inpatient Hospital Stay (HOSPITAL_COMMUNITY)
Admission: EM | Admit: 2022-07-19 | Discharge: 2022-07-29 | DRG: 808 | Disposition: A | Discharge status: Home Health | Payer: Medicare PPO | Attending: Internal Medicine | Admitting: Internal Medicine

## 2022-07-19 ENCOUNTER — Other Ambulatory Visit: Payer: Self-pay

## 2022-07-19 DIAGNOSIS — I959 Hypotension, unspecified: Secondary | ICD-10-CM | POA: Diagnosis not present

## 2022-07-19 DIAGNOSIS — D649 Anemia, unspecified: Secondary | ICD-10-CM

## 2022-07-19 DIAGNOSIS — A419 Sepsis, unspecified organism: Principal | ICD-10-CM | POA: Diagnosis present

## 2022-07-19 DIAGNOSIS — Z86718 Personal history of other venous thrombosis and embolism: Secondary | ICD-10-CM

## 2022-07-19 DIAGNOSIS — R7401 Elevation of levels of liver transaminase levels: Secondary | ICD-10-CM

## 2022-07-19 DIAGNOSIS — K219 Gastro-esophageal reflux disease without esophagitis: Secondary | ICD-10-CM | POA: Diagnosis present

## 2022-07-19 DIAGNOSIS — D731 Hypersplenism: Secondary | ICD-10-CM | POA: Diagnosis present

## 2022-07-19 DIAGNOSIS — Z20822 Contact with and (suspected) exposure to covid-19: Secondary | ICD-10-CM | POA: Diagnosis not present

## 2022-07-19 DIAGNOSIS — K766 Portal hypertension: Secondary | ICD-10-CM | POA: Diagnosis present

## 2022-07-19 DIAGNOSIS — D709 Neutropenia, unspecified: Principal | ICD-10-CM | POA: Diagnosis present

## 2022-07-19 DIAGNOSIS — K8010 Calculus of gallbladder with chronic cholecystitis without obstruction: Secondary | ICD-10-CM | POA: Diagnosis present

## 2022-07-19 DIAGNOSIS — Z452 Encounter for adjustment and management of vascular access device: Secondary | ICD-10-CM | POA: Diagnosis not present

## 2022-07-19 DIAGNOSIS — D696 Thrombocytopenia, unspecified: Secondary | ICD-10-CM | POA: Diagnosis not present

## 2022-07-19 DIAGNOSIS — Z885 Allergy status to narcotic agent status: Secondary | ICD-10-CM

## 2022-07-19 DIAGNOSIS — E871 Hypo-osmolality and hyponatremia: Secondary | ICD-10-CM | POA: Diagnosis present

## 2022-07-19 DIAGNOSIS — R5081 Fever presenting with conditions classified elsewhere: Secondary | ICD-10-CM | POA: Diagnosis not present

## 2022-07-19 DIAGNOSIS — Z8719 Personal history of other diseases of the digestive system: Secondary | ICD-10-CM | POA: Diagnosis not present

## 2022-07-19 DIAGNOSIS — C9201 Acute myeloblastic leukemia, in remission: Secondary | ICD-10-CM

## 2022-07-19 DIAGNOSIS — D45 Polycythemia vera: Secondary | ICD-10-CM | POA: Diagnosis not present

## 2022-07-19 DIAGNOSIS — N179 Acute kidney failure, unspecified: Secondary | ICD-10-CM | POA: Diagnosis not present

## 2022-07-19 DIAGNOSIS — Z8711 Personal history of peptic ulcer disease: Secondary | ICD-10-CM | POA: Diagnosis not present

## 2022-07-19 DIAGNOSIS — Z9079 Acquired absence of other genital organ(s): Secondary | ICD-10-CM

## 2022-07-19 DIAGNOSIS — R188 Other ascites: Secondary | ICD-10-CM | POA: Diagnosis not present

## 2022-07-19 DIAGNOSIS — C9200 Acute myeloblastic leukemia, not having achieved remission: Secondary | ICD-10-CM | POA: Diagnosis not present

## 2022-07-19 DIAGNOSIS — R000 Tachycardia, unspecified: Secondary | ICD-10-CM | POA: Diagnosis not present

## 2022-07-19 DIAGNOSIS — K7460 Unspecified cirrhosis of liver: Secondary | ICD-10-CM | POA: Diagnosis present

## 2022-07-19 DIAGNOSIS — I10 Essential (primary) hypertension: Secondary | ICD-10-CM | POA: Diagnosis not present

## 2022-07-19 DIAGNOSIS — Z79899 Other long term (current) drug therapy: Secondary | ICD-10-CM

## 2022-07-19 DIAGNOSIS — E8770 Fluid overload, unspecified: Secondary | ICD-10-CM | POA: Diagnosis not present

## 2022-07-19 DIAGNOSIS — Z881 Allergy status to other antibiotic agents status: Secondary | ICD-10-CM

## 2022-07-19 DIAGNOSIS — Z6821 Body mass index (BMI) 21.0-21.9, adult: Secondary | ICD-10-CM

## 2022-07-19 DIAGNOSIS — Z792 Long term (current) use of antibiotics: Secondary | ICD-10-CM

## 2022-07-19 DIAGNOSIS — R161 Splenomegaly, not elsewhere classified: Secondary | ICD-10-CM | POA: Diagnosis not present

## 2022-07-19 DIAGNOSIS — E440 Moderate protein-calorie malnutrition: Secondary | ICD-10-CM | POA: Diagnosis present

## 2022-07-19 DIAGNOSIS — R0689 Other abnormalities of breathing: Secondary | ICD-10-CM | POA: Diagnosis not present

## 2022-07-19 DIAGNOSIS — D7581 Myelofibrosis: Secondary | ICD-10-CM | POA: Diagnosis present

## 2022-07-19 DIAGNOSIS — D631 Anemia in chronic kidney disease: Secondary | ICD-10-CM | POA: Diagnosis not present

## 2022-07-19 DIAGNOSIS — Z7401 Bed confinement status: Secondary | ICD-10-CM | POA: Diagnosis not present

## 2022-07-19 DIAGNOSIS — I820 Budd-Chiari syndrome: Secondary | ICD-10-CM | POA: Diagnosis present

## 2022-07-19 DIAGNOSIS — R1011 Right upper quadrant pain: Secondary | ICD-10-CM | POA: Diagnosis not present

## 2022-07-19 DIAGNOSIS — E883 Tumor lysis syndrome: Secondary | ICD-10-CM | POA: Diagnosis not present

## 2022-07-19 DIAGNOSIS — E876 Hypokalemia: Secondary | ICD-10-CM | POA: Insufficient documentation

## 2022-07-19 DIAGNOSIS — K8020 Calculus of gallbladder without cholecystitis without obstruction: Secondary | ICD-10-CM | POA: Diagnosis not present

## 2022-07-19 DIAGNOSIS — I509 Heart failure, unspecified: Secondary | ICD-10-CM | POA: Diagnosis not present

## 2022-07-19 DIAGNOSIS — D61818 Other pancytopenia: Secondary | ICD-10-CM | POA: Diagnosis not present

## 2022-07-19 DIAGNOSIS — R197 Diarrhea, unspecified: Secondary | ICD-10-CM | POA: Diagnosis present

## 2022-07-19 DIAGNOSIS — N400 Enlarged prostate without lower urinary tract symptoms: Secondary | ICD-10-CM | POA: Diagnosis not present

## 2022-07-19 DIAGNOSIS — Z883 Allergy status to other anti-infective agents status: Secondary | ICD-10-CM

## 2022-07-19 DIAGNOSIS — R0902 Hypoxemia: Secondary | ICD-10-CM | POA: Diagnosis not present

## 2022-07-19 LAB — URINALYSIS, ROUTINE W REFLEX MICROSCOPIC
Bilirubin Urine: NEGATIVE
Glucose, UA: NEGATIVE mg/dL
Ketones, ur: NEGATIVE mg/dL
Leukocytes,Ua: NEGATIVE
Nitrite: NEGATIVE
Protein, ur: 30 mg/dL — AB
Specific Gravity, Urine: 1.016 (ref 1.005–1.030)
pH: 5 (ref 5.0–8.0)

## 2022-07-19 LAB — CBC WITH DIFFERENTIAL/PLATELET
Abs Immature Granulocytes: 0 10*3/uL (ref 0.00–0.07)
Basophils Absolute: 0 10*3/uL (ref 0.0–0.1)
Basophils Relative: 0 %
Eosinophils Absolute: 0 10*3/uL (ref 0.0–0.5)
Eosinophils Relative: 0 %
HCT: 21.1 % — ABNORMAL LOW (ref 39.0–52.0)
Hemoglobin: 7.3 g/dL — ABNORMAL LOW (ref 13.0–17.0)
Lymphocytes Relative: 94 %
Lymphs Abs: 0.2 10*3/uL — ABNORMAL LOW (ref 0.7–4.0)
MCH: 28.3 pg (ref 26.0–34.0)
MCHC: 34.6 g/dL (ref 30.0–36.0)
MCV: 81.8 fL (ref 80.0–100.0)
Monocytes Absolute: 0 10*3/uL — ABNORMAL LOW (ref 0.1–1.0)
Monocytes Relative: 6 %
Neutro Abs: 0 10*3/uL — CL (ref 1.7–7.7)
Neutrophils Relative %: 0 %
Platelets: <5 10*3/uL — CL (ref 150–400)
RBC: 2.58 MIL/uL — ABNORMAL LOW (ref 4.22–5.81)
RDW: 14.8 % (ref 11.5–15.5)
WBC: 0.2 10*3/uL — CL (ref 4.0–10.5)
nRBC: 0 % (ref 0.0–0.2)

## 2022-07-19 LAB — COMPREHENSIVE METABOLIC PANEL
ALT: 90 U/L — ABNORMAL HIGH (ref 0–44)
AST: 55 U/L — ABNORMAL HIGH (ref 15–41)
Albumin: 2.9 g/dL — ABNORMAL LOW (ref 3.5–5.0)
Alkaline Phosphatase: 138 U/L — ABNORMAL HIGH (ref 38–126)
Anion gap: 11 (ref 5–15)
BUN: 44 mg/dL — ABNORMAL HIGH (ref 8–23)
CO2: 20 mmol/L — ABNORMAL LOW (ref 22–32)
Calcium: 7.6 mg/dL — ABNORMAL LOW (ref 8.9–10.3)
Chloride: 102 mmol/L (ref 98–111)
Creatinine, Ser: 1.62 mg/dL — ABNORMAL HIGH (ref 0.61–1.24)
GFR, Estimated: 45 mL/min — ABNORMAL LOW (ref >60)
Glucose, Bld: 177 mg/dL — ABNORMAL HIGH (ref 70–99)
Potassium: 2.9 mmol/L — ABNORMAL LOW (ref 3.5–5.1)
Sodium: 133 mmol/L — ABNORMAL LOW (ref 135–145)
Total Bilirubin: 1.3 mg/dL — ABNORMAL HIGH (ref 0.3–1.2)
Total Protein: 5.9 g/dL — ABNORMAL LOW (ref 6.5–8.1)

## 2022-07-19 LAB — PREPARE RBC (CROSSMATCH)

## 2022-07-19 LAB — RESP PANEL BY RT-PCR (FLU A&B, COVID) ARPGX2
Influenza A by PCR: NEGATIVE
Influenza B by PCR: NEGATIVE
SARS Coronavirus 2 by RT PCR: NEGATIVE

## 2022-07-19 LAB — LACTIC ACID, PLASMA: Lactic Acid, Venous: 1.2 mmol/L (ref 0.5–1.9)

## 2022-07-19 LAB — PROTIME-INR
INR: 2 — ABNORMAL HIGH (ref 0.8–1.2)
Prothrombin Time: 22.9 seconds — ABNORMAL HIGH (ref 11.4–15.2)

## 2022-07-19 MED ORDER — ACETAMINOPHEN 650 MG RE SUPP: 650.0000 mg | Freq: Four times a day (QID) | RECTAL | Status: DC | PRN

## 2022-07-19 MED ORDER — PIPERACILLIN-TAZOBACTAM 3.375 G IVPB: 3.3750 g | Freq: Three times a day (TID) | INTRAVENOUS | Status: DC

## 2022-07-19 MED ORDER — ACETAMINOPHEN 325 MG PO TABS
650.0000 mg | ORAL_TABLET | Freq: Four times a day (QID) | ORAL | Status: DC | PRN
  Administered 2022-07-21 (×2): 650 mg via ORAL
  Filled 2022-07-19 (×2): qty 2

## 2022-07-19 MED ORDER — SODIUM CHLORIDE 0.9 % IV SOLN
10.0000 mL/h | Freq: Once | INTRAVENOUS | Status: AC
  Administered 2022-07-19: 10 mL/h via INTRAVENOUS

## 2022-07-19 MED ORDER — POLYETHYLENE GLYCOL 3350 17 G PO PACK: 17.0000 g | PACK | Freq: Every day | ORAL | Status: DC | PRN

## 2022-07-19 MED ORDER — PIPERACILLIN-TAZOBACTAM 3.375 G IVPB 30 MIN
3.3750 g | Freq: Once | INTRAVENOUS | Status: AC
  Administered 2022-07-19: 3.375 g via INTRAVENOUS
  Filled 2022-07-19: qty 50

## 2022-07-19 MED ORDER — SODIUM CHLORIDE 0.9 % IV SOLN: INTRAVENOUS | Status: DC

## 2022-07-19 MED ORDER — SODIUM CHLORIDE 0.9 % IV BOLUS
1000.0000 mL | Freq: Once | INTRAVENOUS | Status: AC
  Administered 2022-07-19: 1000 mL via INTRAVENOUS

## 2022-07-19 MED ORDER — POTASSIUM CHLORIDE CRYS ER 20 MEQ PO TBCR
60.0000 meq | EXTENDED_RELEASE_TABLET | Freq: Once | ORAL | Status: AC
  Administered 2022-07-19: 60 meq via ORAL
  Filled 2022-07-19: qty 3

## 2022-07-19 MED ORDER — POSACONAZOLE 100 MG PO TBEC
300.0000 mg | DELAYED_RELEASE_TABLET | Freq: Every day | ORAL | Status: DC
  Administered 2022-07-19 – 2022-07-29 (×10): 300 mg via ORAL
  Filled 2022-07-19 (×11): qty 3

## 2022-07-19 MED ORDER — SODIUM CHLORIDE 0.9% FLUSH
3.0000 mL | Freq: Two times a day (BID) | INTRAVENOUS | Status: DC
  Administered 2022-07-19 – 2022-07-29 (×6): 3 mL via INTRAVENOUS

## 2022-07-19 MED ORDER — ACYCLOVIR 400 MG PO TABS
400.0000 mg | ORAL_TABLET | Freq: Two times a day (BID) | ORAL | Status: DC
  Administered 2022-07-19 – 2022-07-29 (×20): 400 mg via ORAL
  Filled 2022-07-19 (×21): qty 1

## 2022-07-19 MED ORDER — ACETAMINOPHEN 325 MG PO TABS
650.0000 mg | ORAL_TABLET | Freq: Once | ORAL | Status: AC
  Administered 2022-07-19: 650 mg via ORAL
  Filled 2022-07-19: qty 2

## 2022-07-19 NOTE — Progress Notes (Signed)
A consult was received from an ED physician for Zosyn per pharmacy dosing.  The patient's profile has been reviewed for ht/wt/allergies/indication/available labs.   A one time order has been placed for Zosyn 3.375g IV x 1.  Further antibiotics/pharmacy consults should be ordered by admitting physician if indicated.                        Amilyah Nack S. Alford Highland, PharmD, BCPS Clinical Staff Pharmacist [email or web address] Thank you, Wayland Salinas 07/19/2022  5:10 PM

## 2022-07-19 NOTE — ED Provider Notes (Incomplete)
I provided a substantive portion of the care of this patient.  I personally performed the entirety of the history for this encounter. {Remember to document shared critical care using "edcritical" dot phrase:1}

## 2022-07-19 NOTE — ED Notes (Signed)
Date and time results received: 07/19/22 10:50 AM  (use smartphrase ".now" to insert current time)  Test: WBC, Plt, absolute neutrophil Critical Value: 0.2, <5, 0.0  Name of Provider Notified: Elie Confer, PA  Orders Received? Or Actions Taken?: see chart

## 2022-07-19 NOTE — ED Triage Notes (Addendum)
Pt from home via [facility] with reports of weakness and urinary frequency. Pt denies pain. Pt received 337m Nacl en route and 650 tylenol. Pt does have blood in mouth, denies vomiting. Reports a "clotting disorder that makes gums bleed when brushing my teeth."

## 2022-07-19 NOTE — ED Provider Notes (Signed)
[facility] DEPT Provider Note   CSN: [id number] Arrival date & time: 07/19/22  3086     History  Chief Complaint  Patient presents with   Weakness    Benjamin [name] is a 71 y.o. male with medical history of acute myeloid leukemia, myelofibrosis, polycythemia vera, clotting disorder, GERD.  Patient presents to ED for evaluation of generalized weakness.  Patient states that 3 to 4 days ago, he is unsure, he developed generalized weakness.  Patient reports that since then he has been mostly bedbound, unable to stand for long periods of time.  The patient reports that this morning he was unable to get off of the toilet when he was using the restroom which prompted him and his wife to call EMS to transport him for evaluation.  Patient states that recently in the last few days he had noticed that urine will be "dribbling" out of his penis mainly at night which is new for him.  Patient reports that currently he is undergoing treatment at [facility] hematology and [place] for polycythemia vera, myelofibrosis.  Patient reports that he often receives blood transfusions when his hemoglobin dips below 8, recently they have had issues controlling his platelet count.  The patient denies any falls associated with his weakness.  Patient denies any pain. The patient denies any one-sided weakness or numbness.  The patient also denies any shortness of breath, chest pain, abdominal pain, nausea, vomiting, diarrhea, fevers at home, dysuria, back pain, penile discharge, flank pain.  The patient was provided 650 mg of Tylenol with EMS, 350 cc of fluid.   Weakness Associated symptoms: no abdominal pain, no chest pain, no diarrhea, no dysuria, no fever, no nausea, no shortness of breath and no vomiting        Home Medications Prior to Admission medications   Medication Sig Start Date End Date Taking? Authorizing Provider  acyclovir (ZOVIRAX) 400 MG tablet Take  400 mg by mouth 2 (two) times daily.   Yes [provider]  OVER THE COUNTER MEDICATION Take 1 tablet by mouth daily. Calcium, vitamin D, with Zinc   Yes [provider]  venetoclax (VENCLEXTA) 100 MG tablet Take 100 mg by mouth daily. 06/25/22  Yes [provider]  allopurinol (ZYLOPRIM) 300 MG tablet Take 300 mg by mouth daily. Patient not taking: Reported on 07/19/2022 06/10/20   [provider]  Cholecalciferol (VITAMIN D) 2000 units CAPS Take by mouth. Patient not taking: Reported on 07/19/2022    [provider]  Magnesium 400 MG CAPS Take 1 capsule by mouth daily.  Patient not taking: Reported on 12/24/2020    [provider]  testosterone (ANDRODERM) 4 MG/24HR PT24 patch Place 1 patch onto the skin daily. Patient not taking: Reported on 07/19/2022 03/01/20   Volanda Napoleon, MD      Allergies    Aleve [naproxen sodium], Biaxin [clarithromycin], Cephalexin, Flagyl [metronidazole], and Morphine and related    Review of Systems   Review of Systems  Constitutional:  Negative for fever.  Respiratory:  Negative for shortness of breath.   Cardiovascular:  Negative for chest pain.  Gastrointestinal:  Negative for abdominal pain, diarrhea, nausea and vomiting.  Genitourinary:  Negative for dysuria, flank pain and penile discharge.  Musculoskeletal:  Negative for back pain.  Neurological:  Positive for weakness. Negative for numbness.  All other systems reviewed and are negative.   Physical Exam Updated Vital Signs BP (!) 115/53   Pulse 93  Temp (!) 100.8 F (38.2 C) (Oral)   Resp 18   SpO2 99%  Physical Exam Vitals and nursing note reviewed.  Constitutional:      General: He is not in acute distress.    Appearance: Normal appearance. He is not ill-appearing, toxic-appearing or diaphoretic.  HENT:     Head: Normocephalic and atraumatic.     Nose: Nose normal. No congestion.     Mouth/Throat:     Mouth: Mucous membranes are  moist.     Pharynx: Oropharynx is clear.  Eyes:     Extraocular Movements: Extraocular movements intact.     Conjunctiva/sclera: Conjunctivae normal.     Pupils: Pupils are equal, round, and reactive to light.  Cardiovascular:     Rate and Rhythm: Regular rhythm. Tachycardia present.  Pulmonary:     Effort: Pulmonary effort is normal.     Breath sounds: Normal breath sounds. No wheezing.  Abdominal:     General: Abdomen is flat. Bowel sounds are normal.     Palpations: Abdomen is soft.     Tenderness: There is no abdominal tenderness.  Musculoskeletal:     Cervical back: Normal range of motion and neck supple.  Skin:    General: Skin is warm and dry.     Capillary Refill: Capillary refill takes less than 2 seconds.  Neurological:     Mental Status: He is alert and oriented to person, place, and time.     ED Results / Procedures / Treatments   Labs (all labs ordered are listed, but only abnormal results are displayed) Labs Reviewed  COMPREHENSIVE METABOLIC PANEL - Abnormal; Notable for the following components:      Result Value   Sodium 133 (*)    Potassium 2.9 (*)    CO2 20 (*)    Glucose, Bld 177 (*)    BUN 44 (*)    Creatinine, Ser 1.62 (*)    Calcium 7.6 (*)    Total Protein 5.9 (*)    Albumin 2.9 (*)    AST 55 (*)    ALT 90 (*)    Alkaline Phosphatase 138 (*)    Total Bilirubin 1.3 (*)    GFR, Estimated 45 (*)    All other components within normal limits  CBC WITH DIFFERENTIAL/PLATELET - Abnormal; Notable for the following components:   WBC 0.2 (*)    RBC 2.58 (*)    Hemoglobin 7.3 (*)    HCT 21.1 (*)    Platelets <5 (*)    Neutro Abs 0.0 (*)    Lymphs Abs 0.2 (*)    Monocytes Absolute 0.0 (*)    All other components within normal limits  PROTIME-INR - Abnormal; Notable for the following components:   Prothrombin Time 22.9 (*)    INR 2.0 (*)    All other components within normal limits  URINALYSIS, ROUTINE W REFLEX MICROSCOPIC - Abnormal; Notable for  the following components:   Color, Urine AMBER (*)    APPearance HAZY (*)    Hgb urine dipstick MODERATE (*)    Protein, ur 30 (*)    Bacteria, UA RARE (*)    Crystals PRESENT (*)    All other components within normal limits  RESP PANEL BY RT-PCR (FLU A&B, COVID) ARPGX2  CULTURE, BLOOD (ROUTINE X 2)  CULTURE, BLOOD (ROUTINE X 2)  C DIFFICILE QUICK SCREEN W PCR REFLEX    GASTROINTESTINAL PANEL BY PCR, STOOL (REPLACES STOOL CULTURE)  LACTIC ACID, PLASMA  TYPE AND SCREEN  PREPARE RBC (CROSSMATCH)  PREPARE PLATELET PHERESIS    EKG None  Radiology US Abdomen Limited RUQ (LIVER/GB)  Result Date: 07/19/2022 CLINICAL DATA:  Right upper quadrant pain EXAM: ULTRASOUND ABDOMEN LIMITED RIGHT UPPER QUADRANT COMPARISON:  CT 07/19/2022 FINDINGS: Gallbladder: Positive for gallstones. Increased wall thickness measuring 4 mm. Pericholecystic fluid. Negative sonographic Murphy. Common bile duct: Diameter: 3 mm Liver: No focal lesion identified. Within normal limits in parenchymal echogenicity. Portal vein is patent on color Doppler imaging with normal direction of blood flow towards the liver. Other: None. IMPRESSION: 1. Cholelithiasis with diffuse gallbladder wall thickening and pericholecystic fluid, but negative sonographic Percell Miller, findings are indeterminate for acute cholecystitis. Consider correlation with nuclear medicine hepatobiliary imaging. Electronically Signed   By: Donavan Foil M.D.   On: 07/19/2022 15:31   CT Renal Stone Study  Result Date: 07/19/2022 CLINICAL DATA:  Abdominal and flank pain. EXAM: CT ABDOMEN AND PELVIS WITHOUT CONTRAST TECHNIQUE: Multidetector CT imaging of the abdomen and pelvis was performed following the standard protocol without IV contrast. RADIATION DOSE REDUCTION: This exam was performed according to the departmental dose-optimization program which includes automated exposure control, adjustment of the mA and/or kV according to patient size and/or use of iterative  reconstruction technique. COMPARISON:  CTA abdomen pelvis 12/15/2015 FINDINGS: Lower chest: Similar appearance of chronic atelectasis or scarring in the posterior right lung base. Hepatobiliary: No suspicious focal abnormality in the liver on this study without intravenous contrast. Attenuation of liver parenchyma appears mildly increased which may be related to amiodarone therapy or iron deposition disease. Tiny gallstones are seen in the gallbladder lumen with subtle pericholecystic fluid towards the fundus. No intrahepatic or extrahepatic biliary dilation. Pancreas: No focal mass lesion. No dilatation of the main duct. No intraparenchymal cyst. No peripancreatic edema. Spleen: Splenomegaly. Spleen measures 19.6 x 16.4 x 13.6 cm with estimated volume of 2566 cc. Adrenals/Urinary Tract: No adrenal nodule or mass. Kidneys unremarkable. No evidence for hydroureter. Bladder has a somewhat irregular contour anteriorly without appreciable bladder wall thickening. Stomach/Bowel: Stomach is unremarkable. No gastric wall thickening. No evidence of outlet obstruction. Duodenum is normally positioned as is the ligament of Treitz. No small bowel wall thickening. No small bowel dilatation. The terminal ileum is normal. The appendix is normal. No gross colonic mass. No colonic wall thickening. There is fluid throughout the colon extending down to the distal rectum, imaging features compatible with clinical diarrhea. Vascular/Lymphatic: There is mild atherosclerotic calcification of the abdominal aorta without aneurysm. Marked prominence of the portal vein and splenic vein suggests portal venous hypertension. There is no gastrohepatic or hepatoduodenal ligament lymphadenopathy. No retroperitoneal or mesenteric lymphadenopathy. No pelvic sidewall lymphadenopathy. Reproductive: Prostate gland is enlarged with central defect suggesting previous TURP. Other: Trace free fluid is seen adjacent to the liver and in the right paracolic  gutter. Musculoskeletal: No worrisome lytic or sclerotic osseous abnormality. IMPRESSION: 1. Fluid throughout the colon extending down to the distal rectum, imaging features compatible with clinical diarrhea. No colonic wall thickening or pericolonic edema. 2. Cholelithiasis with subtle pericholecystic fluid towards the fundus. If there is clinical concern for acute cholecystitis, right upper quadrant ultrasound could be used to further evaluate. 3. Splenomegaly with estimated volume of 2566 cc. Marked prominence of the portal vein and splenic. Findings suggest portal venous hypertension. 4. Trace free fluid adjacent to the liver and in the right paracolic gutter. 5. Prostatomegaly with central defect suggesting previous TURP. 6. Attenuation of liver parenchyma appears mildly increased which can be related to amiodarone therapy  or iron deposition disease. 7.  Aortic Atherosclerosis (ICD10-I70.0). Electronically Signed   By: Misty Stanley M.D.   On: 07/19/2022 13:06   DG Chest 2 View  Result Date: 07/19/2022 CLINICAL DATA:  Suspected sepsis. EXAM: CHEST - 2 VIEW COMPARISON:  01/01/2016 FINDINGS: The lungs are clear without focal pneumonia, edema, pneumothorax or pleural effusion. Left PICC line tip overlies the proximal SVC level. The lungs are clear without focal pneumonia, edema, pneumothorax or pleural effusion. The cardiopericardial silhouette is within normal limits for size. Telemetry leads overlie the chest. IMPRESSION: No active cardiopulmonary disease. Electronically Signed   By: Misty Stanley M.D.   On: 07/19/2022 09:43    Procedures Procedures   Medications Ordered in ED Medications  piperacillin-tazobactam (ZOSYN) IVPB 3.375 g (3.375 g Intravenous New Bag/Given 07/19/22 1721)  acetaminophen (TYLENOL) tablet 650 mg (has no administration in time range)  sodium chloride 0.9 % bolus 1,000 mL (0 mLs Intravenous Stopped 07/19/22 1152)  potassium chloride SA (KLOR-CON M) CR tablet 60 mEq (60 mEq  Oral Given 07/19/22 1152)  0.9 %  sodium chloride infusion (0 mL/hr Intravenous Stopped 07/19/22 1730)  0.9 %  sodium chloride infusion (10 mL/hr Intravenous New Bag/Given 07/19/22 1730)    ED Course/ Medical Decision Making/ A&P                           Medical Decision Making Amount and/or Complexity of Data Reviewed Labs: ordered. Radiology: ordered.  Risk Prescription drug management.   71 year old male presents to the ED for evaluation of weakness.  Please see HPI for further details.  On examination the patient is febrile, tachycardic.  Patient lung sounds clear bilaterally, he is not hypoxic.  The patient has no abdominal tenderness.  The patient neurological examination shows no focal neurodeficits.  The patient has equal strength bilateral lower extremities, equal grip strength bilateral upper extremities.  The patient has no facial droop, slurred speech.  The patient cranial nerves II through XII are intact, patient can perform rapid alternating movements.  The patient has intact finger-nose, heel-to-shin.  No pronator drift.  Due to patient complaint and vital signs we will proceed with the following labs to include CBC, CMP, lactic acid x2, blood cultures, urinalysis, PT/INR, respiratory panel, type and screen, chest x-ray  Patient CBC is significant for decreased white blood cell count of 0.2.  The patient platelets are less than 5.  The patient neutrophil count of 0.0.  Based on chart review, this is consistent with the patient baseline values.  On chart review, it appears that this patient should receive transfusions for hemoglobin less than 8.  The patient hemoglobin is 7.3.  2 units of PRBCs been ordered at this time.  Patient CMP significant for decreased sodium to 133, decreased potassium to 2.9.  Potassium was repleted with 60 mEq of oral potassium.  Patient creatinine is elevated 1.62 which is a marked change from creatinine 2 days ago which was shown to be 0.72.   Patient has AKI.  Fluids been ordered at this time.  Patient also has elevated total bilirubin 1.3, alk phos 138.  Patient lactic acid not elevated at 1.2.  Patient urinalysis shows rare bacteria, moderate hemoglobin.  Patient chest x-ray unremarkable.  Due to elevated creatinine, hemoglobin on urinalysis decision was made to CT renal stone study this patient.  CT renal stone study does not show any obstructing nephrolithiasis, ureterolithiasis.  CT renal stone study does show findings consistent  with diarrhea.  C. difficile panel has been sent off at this time along with stool study.  Patient also shown to have cholelithiasis with a subtle pericholecystic fluid towards the fundus.  Right upper quadrant ultrasound has been ordered at this time.  Patient also noted to have splenomegaly.  With portal venous hypertension.  There is also prostatomegaly.  Right upper quadrant ultrasound findings show cholelithiasis with diffuse gallbladder wall thickening and pericholecystic fluid but a negative sonographic Murphy's sign.  These findings are indeterminate for acute cholecystitis and they have suggested a HIDA scan.  With these findings, I consulted [facility] hospitalist for admission.  Dr. Grandville Silos has requested that I reach out to oncology team to ensure that this patient is suitable to stay here and receive treatment.  Dr. Lorenso Courier, hematology oncology, has returned my call and stated that this patient is suitable to remain here and can receive care.  Dr. Lorenso Courier has requested that I start this patient on cefepime 2 g every 8 hours.  The patient does have an allergy to Keflex, hives and rash.  Patient has no allergies to penicillin so I will start the patient on Zosyn per pharmacy consult.  Dr. Grandville Silos, [facility] hospitalist, has agreed to admit the patient.  Dr. Grandville Silos has requested I order platelets for this patient.  This has been done.  Antibiotics have been ordered.  Patient amenable to plan.  Patient stable  for admission.   Final Clinical Impression(s) / ED Diagnoses Final diagnoses:  AKI (acute kidney injury) ([place])  Anemia, unspecified type  Neutropenic fever [facility])    Rx / DC Orders ED Discharge Orders     None         Lawana Chambers 07/19/22 1732    Charlesetta Shanks, MD 07/20/22 1836

## 2022-07-19 NOTE — H&P (Signed)
History and Physical   Benjamin [name] KPV:[id number] DOB: [birth year] DOA: 07/19/2022  PCP: Burnard Bunting, MD   Patient coming from: Home  Chief Complaint: Weakness  HPI: Benjamin [name] is a 71 y.o. 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  Patient reports 3 to 4 days of generalized weakness.  Since that time he has been largely bedbound and has been unable to stand for extended periods of time.  This morning he was unable to get off the toilet which is what prompted his wife to call EMS.  He was transported to the ED for further evaluation.  He states he follows with [facility] for his hematology/oncology care.  He receives transfusions at times and has had issues with his platelets recently.  He does report a few days of urinary dribbling which is new for him and diarrhea with very watery stools.  He denies fevers, chills, chest pain, shortness of breath, abdominal pain, constipation, diarrhea, nausea, vomiting.  ED Course: Vital signs in the ED significant for fever to 101.3, heart rate in the 80s to 100s, blood pressure in the 67J to 449 systolic, respiratory rate in the teens to 20s saturating well on room air.  Lab workup included CMP with sodium 133, potassium 2.9, bicarb 20, creatinine elevated to 1.62 from baseline of 0.8, BUN 44, glucose 177, calcium 7.6 which corrects to 8.5 when considering albumin of 2.9, protein 5.9, AST 55 which has been intermittently elevated in the past, ALT 90 which has been intermittently elevated in past, ALP 138 which has been intermittently elevated in the past.  T. bili stable at 1.7.  CBC showed hemoglobin stable at around 7.3, platelets less than 5, white count of 0.1 with ANC of 0.0.  PT of 22.4 and INR of 2.  Lactic acid  normal with repeat pending.  RVP for flu and COVID-negative.  Patient typed and screened in the ED.  Urinalysis with hemoglobin, protein, rare bacteria.  C. difficile panel and blood cultures pending.  GI pathogen panel pending.  Imaging studies included chest x-ray which showed no acute normality.  CT renal stone study which showed fluid in the colon consistent with diarrhea, no wall thickening nor paracolic edema.  There was evidence of gallbladder stone and subtle pericholecystic fluid, splenomegaly noted as well as trace free fluid around the liver, prostatomegaly with evidence of prior TURP noted right upper quadrant ultrasound performed which showed gallbladder wall thickening and pericholecystic fluid however negative sonographic Murphy sign's.  Patient started on Zosyn in the ED.  Received Tylenol a liter of fluids and 60 mEq of p.o. potassium.  1 unit of platelets and 2 units of packed red blood cells have been ordered.  Case was discussed with hematology/oncology here who states patient is appropriate to remain in our hospital system and request cefepime be started, they will see the patient while admitted.  Review of Systems: As per HPI otherwise all other systems reviewed and are negative.  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 He has never used smokeless tobacco. He reports current alcohol use. He reports that he does not use drugs.  Allergies  Allergen Reactions   Aleve [Naproxen Sodium] Other (See Comments)    Pt thinks it might be all NSAIDs but is unsure. Caused him to have emergency stomach surgery.   Biaxin [Clarithromycin] Other (See Comments)    Joint pains   Cephalexin Hives and Rash   Flagyl [Metronidazole] Other (See Comments)    Joint pain   Morphine And Related Hives    Family History  Problem Relation Age of Onset   Heart disease Father    Hyperlipidemia Father    Hypertension Father    Diabetes Father    Diabetes Brother    Hyperlipidemia Brother    Prostate cancer Brother        oldest brother   Colon cancer Neg Hx   Reviewed on admission  Prior to Admission medications   Medication Sig Start Date End Date Taking? Authorizing Provider  acyclovir (ZOVIRAX) 400 MG tablet Take 400 mg by mouth 2 (two) times daily.   Yes [provider]  OVER THE COUNTER MEDICATION Take 1 tablet by mouth daily. Calcium, vitamin D, with Zinc   Yes [provider]  venetoclax (VENCLEXTA) 100 MG tablet Take 100 mg by mouth daily. 06/25/22  Yes [provider]  allopurinol (ZYLOPRIM) 300 MG tablet Take 300 mg by mouth daily. Patient not taking: Reported on 07/19/2022 06/10/20   [provider]  Cholecalciferol (VITAMIN D) 2000 units CAPS Take by  mouth. Patient not taking: Reported on 07/19/2022    [provider]  Magnesium 400 MG CAPS Take 1 capsule by mouth daily.  Patient not taking: Reported on 12/24/2020    [provider]  testosterone (ANDRODERM) 4 MG/24HR PT24 patch Place 1 patch onto the skin daily. Patient not taking: Reported on 07/19/2022 03/01/20   Volanda Napoleon, MD    Physical Exam: Vitals:   07/19/22 1706 07/19/22 1722 07/19/22 1745 07/19/22 1800  BP: (!) 124/59 (!) 115/53 (!) 120/53 (!) 112/52  Pulse: 94 93 91 95  Resp: 18 18 (!) 23 20  Temp: (!) 100.5 F (38.1 C) (!) 100.8 F (38.2 C)    TempSrc: Oral Oral    SpO2:  99% 97% 97%    Physical Exam Constitutional:      General: He is not in acute distress.  Appearance: Normal appearance.  HENT:     Head: Normocephalic and atraumatic.     Mouth/Throat:     Mouth: Mucous membranes are moist.     Pharynx: Oropharynx is clear.  Eyes:     Extraocular Movements: Extraocular movements intact.     Pupils: Pupils are equal, round, and reactive to light.  Cardiovascular:     Rate and Rhythm: Regular rhythm. Tachycardia present.     Pulses: Normal pulses.     Heart sounds: Normal heart sounds.  Pulmonary:     Effort: Pulmonary effort is normal. No respiratory distress.     Breath sounds: Normal breath sounds.  Abdominal:     General: Bowel sounds are normal. There is no distension.     Palpations: Abdomen is soft.     Tenderness: There is no abdominal tenderness.  Musculoskeletal:        General: No swelling or deformity.  Skin:    General: Skin is warm and dry.  Neurological:     General: No focal deficit present.     Mental Status: Mental status is at baseline.    Labs on Admission: I have personally reviewed following labs and imaging studies  CBC: Recent Labs  Lab 07/19/22 0905  WBC 0.2*  NEUTROABS 0.0*  HGB 7.3*  HCT 21.1*  MCV 81.8  PLT <5*    Basic Metabolic Panel: Recent Labs  Lab 07/19/22 0905  NA 133*  K  2.9*  CL 102  CO2 20*  GLUCOSE 177*  BUN 44*  CREATININE 1.62*  CALCIUM 7.6*    GFR: CrCl cannot be calculated (Unknown ideal weight.).  Liver Function Tests: Recent Labs  Lab 07/19/22 0905  AST 55*  ALT 90*  ALKPHOS 138*  BILITOT 1.3*  PROT 5.9*  ALBUMIN 2.9*    Urine analysis:    Component Value Date/Time   COLORURINE AMBER (A) 07/19/2022 1247   APPEARANCEUR HAZY (A) 07/19/2022 1247   LABSPEC 1.016 07/19/2022 1247   PHURINE 5.0 07/19/2022 1247   GLUCOSEU NEGATIVE 07/19/2022 1247   HGBUR MODERATE (A) 07/19/2022 1247   BILIRUBINUR NEGATIVE 07/19/2022 [place] 07/19/2022 1247   PROTEINUR 30 (A) 07/19/2022 1247   NITRITE NEGATIVE 07/19/2022 1247   LEUKOCYTESUR NEGATIVE 07/19/2022 1247    Radiological Exams on Admission: US Abdomen Limited RUQ (LIVER/GB)  Result Date: 07/19/2022 CLINICAL DATA:  Right upper quadrant pain EXAM: ULTRASOUND ABDOMEN LIMITED RIGHT UPPER QUADRANT COMPARISON:  CT 07/19/2022 FINDINGS: Gallbladder: Positive for gallstones. Increased wall thickness measuring 4 mm. Pericholecystic fluid. Negative sonographic Murphy. Common bile duct: Diameter: 3 mm Liver: No focal lesion identified. Within normal limits in parenchymal echogenicity. Portal vein is patent on color Doppler imaging with normal direction of blood flow towards the liver. Other: None. IMPRESSION: 1. Cholelithiasis with diffuse gallbladder wall thickening and pericholecystic fluid, but negative sonographic Percell Miller, findings are indeterminate for acute cholecystitis. Consider correlation with nuclear medicine hepatobiliary imaging. Electronically Signed   By: Donavan Foil M.D.   On: 07/19/2022 15:31   CT Renal Stone Study  Result Date: 07/19/2022 CLINICAL DATA:  Abdominal and flank pain. EXAM: CT ABDOMEN AND PELVIS WITHOUT CONTRAST TECHNIQUE: Multidetector CT imaging of the abdomen and pelvis was performed following the standard protocol without IV contrast. RADIATION DOSE  REDUCTION: This exam was performed according to the departmental dose-optimization program which includes automated exposure control, adjustment of the mA and/or kV according to patient size and/or use of iterative reconstruction technique. COMPARISON:  CTA abdomen pelvis 12/15/2015  FINDINGS: Lower chest: Similar appearance of chronic atelectasis or scarring in the posterior right lung base. Hepatobiliary: No suspicious focal abnormality in the liver on this study without intravenous contrast. Attenuation of liver parenchyma appears mildly increased which may be related to amiodarone therapy or iron deposition disease. Tiny gallstones are seen in the gallbladder lumen with subtle pericholecystic fluid towards the fundus. No intrahepatic or extrahepatic biliary dilation. Pancreas: No focal mass lesion. No dilatation of the main duct. No intraparenchymal cyst. No peripancreatic edema. Spleen: Splenomegaly. Spleen measures 19.6 x 16.4 x 13.6 cm with estimated volume of 2566 cc. Adrenals/Urinary Tract: No adrenal nodule or mass. Kidneys unremarkable. No evidence for hydroureter. Bladder has a somewhat irregular contour anteriorly without appreciable bladder wall thickening. Stomach/Bowel: Stomach is unremarkable. No gastric wall thickening. No evidence of outlet obstruction. Duodenum is normally positioned as is the ligament of Treitz. No small bowel wall thickening. No small bowel dilatation. The terminal ileum is normal. The appendix is normal. No gross colonic mass. No colonic wall thickening. There is fluid throughout the colon extending down to the distal rectum, imaging features compatible with clinical diarrhea. Vascular/Lymphatic: There is mild atherosclerotic calcification of the abdominal aorta without aneurysm. Marked prominence of the portal vein and splenic vein suggests portal venous hypertension. There is no gastrohepatic or hepatoduodenal ligament lymphadenopathy. No retroperitoneal or mesenteric  lymphadenopathy. No pelvic sidewall lymphadenopathy. Reproductive: Prostate gland is enlarged with central defect suggesting previous TURP. Other: Trace free fluid is seen adjacent to the liver and in the right paracolic gutter. Musculoskeletal: No worrisome lytic or sclerotic osseous abnormality. IMPRESSION: 1. Fluid throughout the colon extending down to the distal rectum, imaging features compatible with clinical diarrhea. No colonic wall thickening or pericolonic edema. 2. Cholelithiasis with subtle pericholecystic fluid towards the fundus. If there is clinical concern for acute cholecystitis, right upper quadrant ultrasound could be used to further evaluate. 3. Splenomegaly with estimated volume of 2566 cc. Marked prominence of the portal vein and splenic. Findings suggest portal venous hypertension. 4. Trace free fluid adjacent to the liver and in the right paracolic gutter. 5. Prostatomegaly with central defect suggesting previous TURP. 6. Attenuation of liver parenchyma appears mildly increased which can be related to amiodarone therapy or iron deposition disease. 7.  Aortic Atherosclerosis (ICD10-I70.0). Electronically Signed   By: Misty Stanley M.D.   On: 07/19/2022 13:06   DG Chest 2 View  Result Date: 07/19/2022 CLINICAL DATA:  Suspected sepsis. EXAM: CHEST - 2 VIEW COMPARISON:  01/01/2016 FINDINGS: The lungs are clear without focal pneumonia, edema, pneumothorax or pleural effusion. Left PICC line tip overlies the proximal SVC level. The lungs are clear without focal pneumonia, edema, pneumothorax or pleural effusion. The cardiopericardial silhouette is within normal limits for size. Telemetry leads overlie the chest. IMPRESSION: No active cardiopulmonary disease. Electronically Signed   By: Misty Stanley M.D.   On: 07/19/2022 09:43    EKG: Not performed in the emergency department.  Assessment/Plan Principal Problem:   Febrile neutropenia ([name]) Active Problems:   P. vera ([name])    Erythropoietin deficiency anemia   Acute myeloid leukemia ([name])   Thrombocytopenia ([name])   Hypokalemia   AKI (acute kidney injury) ([place])   Transaminitis   Febrile neutropenia AML with myelofibrosis and myeloid metaplasia > Patient presenting with ongoing weakness for the past 3 to 4 days. > Found to be anemic as below but not far from his baseline. > Additionally found to have fever to 101.3 and ANC of 0.0 with WBC  of 0.1. > This is in the setting of known AML with mild fibrosis and mild metaplasia followed at [facility].  Per chart review this is being treated as AML with decitabine and venetoclax.  Patient has history of tumor lysis syndrome. > Possible sources include cholecystitis given imaging changes however there is no Murphy sign.  There is bacteria in urine and there is evidence of diarrhea in his abdominal imaging. > Blood cultures, C. difficile panel, GI pathogen panel are pending.  Chest x-ray is clear. > Case discussed with oncology in ED who stated case can be managed here and they will see the patient. Recommended cefepime, but Zosyn was started due to cephalosporin allergy. - Monitor on stepdown unit with telemetry - Appreciate hematology/oncology recommendations - Continue with IV Zosyn - Trend fever curve and WBC - Follow-up blood cultures, C. difficile panel, GI pathogen panel - Hold home venetoclax - Continue home acyclovir and posaconazole - Will check uric acid for completeness  Thrombocytopenia > Patient additionally noted to have severe thrombocytopenia in the ED.  Platelets of less than 5. > In the setting of AML with myelofibrosis and myeloid metaplasia as above. > Platelet transfusion ordered in the ED.  Will need to continue to trend and transfuse as needed. - Appreciate hematology/oncology recommendations - Trend CBC - If any altered mentation will need CT head to evaluate for possible bleeding - If any significant change in hemodynamic stability  will need to look closely for bleed, no retroperitoneal bleed on CT renal stone study.  Hemoglobin currently stable.  Anemia > Patient has known history of anemia in the setting of his hematologic issues above. > Hemoglobin currently 7.3 which is near his baseline.  He states he previously received transfusions for hemoglobin less than 8. > Transfusion has been ordered in the ED. - Continue with transfusion - Trend CBC  Hypokalemia AKI > Creatinine elevated to 1.62 from baseline of 0.8.  Potassium noted to be 2.9. > Decreased p.o. intake with decreased mobility this week. > Received a liter of fluids in the ED.  Also received 60 mill equivalents p.o. potassium. - Continue with IV fluids - Trend renal function and electrolytes - Check magnesium - Checking uric acid as above  Transaminitis > History of this previously but had improved.  Thought that it could be due to his chemotherapy agents. > Less elevated than previously currently AST 55, ALT 90, ALP 138. 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  Had been managed on Hydrea since then.  With his current anemia issues no longer on Hydrea. > Previously had DVT associated with this.  History of GERD and pud with history of perforation > Perforated gastric ulcer was H. pylori positive (years ago) and has been treated, not currently on PPI.  DVT prophylaxis: SCDs Code Status:   Full Family Communication:  Attempted to contact patient's wife by phone but there was no answer, call went straight to voicemail.  Patient told me that his wife was with him earlier and was aware of his admission and that he would be staying here at [facility].   Disposition Plan:   Patient is from:  Home  Anticipated DC to:  Home  Anticipated DC date:  Pending clinical course  Anticipated DC barriers: Complex hematology/oncology  needs  Consults called:  Hematology/oncology, Dr. Lorenso Courier. Admission status:  Inpatient, stepdown  Severity of Illness: The appropriate patient status for this patient is INPATIENT. Inpatient status is judged to be reasonable and necessary in order to provide the required intensity of service to ensure the patient's safety. The patient's presenting symptoms, physical exam findings, and initial radiographic and laboratory data in the context of their chronic comorbidities is felt to place them at high risk for further clinical deterioration. Furthermore, it is not anticipated that the patient will be medically stable for discharge from the hospital within 2 midnights of admission.   * I certify that at the point of admission it is my clinical judgment that the patient will require inpatient hospital care spanning beyond 2 midnights from the point of admission due to high intensity of service, high risk for further deterioration and high frequency of surveillance required.Marcelyn Bruins MD [facility]  How to contact the [facility] Attending or Consulting provider [place] or covering provider during after hours [place], for this patient?   Check the care team in [facility] and look for a) attending/consulting [facility] provider listed and b) the [facility] team listed Log into [email or web address] and use [facility]'s universal password to access. If you do not have the password, please contact the hospital operator. Locate the [facility] provider you are looking for under [facility] and page to a number that you can be directly reached. If you still have difficulty reaching the provider, please page the [facility] (Director on Call) for the Hospitalists listed on amion for assistance.  07/19/2022, 6:25 PM

## 2022-07-20 DIAGNOSIS — R7401 Elevation of levels of liver transaminase levels: Secondary | ICD-10-CM | POA: Insufficient documentation

## 2022-07-20 DIAGNOSIS — K219 Gastro-esophageal reflux disease without esophagitis: Secondary | ICD-10-CM | POA: Insufficient documentation

## 2022-07-20 DIAGNOSIS — Z86718 Personal history of other venous thrombosis and embolism: Secondary | ICD-10-CM

## 2022-07-20 DIAGNOSIS — D649 Anemia, unspecified: Secondary | ICD-10-CM | POA: Diagnosis not present

## 2022-07-20 DIAGNOSIS — Z792 Long term (current) use of antibiotics: Secondary | ICD-10-CM

## 2022-07-20 DIAGNOSIS — I820 Budd-Chiari syndrome: Secondary | ICD-10-CM

## 2022-07-20 DIAGNOSIS — C9200 Acute myeloblastic leukemia, not having achieved remission: Secondary | ICD-10-CM | POA: Diagnosis not present

## 2022-07-20 DIAGNOSIS — N179 Acute kidney failure, unspecified: Secondary | ICD-10-CM | POA: Diagnosis not present

## 2022-07-20 DIAGNOSIS — D709 Neutropenia, unspecified: Secondary | ICD-10-CM | POA: Diagnosis not present

## 2022-07-20 LAB — CBC WITH DIFFERENTIAL/PLATELET
Abs Immature Granulocytes: 0 10*3/uL (ref 0.00–0.07)
Basophils Absolute: 0 10*3/uL (ref 0.0–0.1)
Basophils Relative: 0 %
Eosinophils Absolute: 0 10*3/uL (ref 0.0–0.5)
Eosinophils Relative: 0 %
HCT: 23.9 % — ABNORMAL LOW (ref 39.0–52.0)
Hemoglobin: 8.4 g/dL — ABNORMAL LOW (ref 13.0–17.0)
Immature Granulocytes: 0 %
Lymphocytes Relative: 80 %
Lymphs Abs: 0.1 10*3/uL — ABNORMAL LOW (ref 0.7–4.0)
MCH: 28.3 pg (ref 26.0–34.0)
MCHC: 35.1 g/dL (ref 30.0–36.0)
MCV: 80.5 fL (ref 80.0–100.0)
Monocytes Absolute: 0 10*3/uL — ABNORMAL LOW (ref 0.1–1.0)
Monocytes Relative: 13 %
Neutro Abs: 0 10*3/uL — CL (ref 1.7–7.7)
Neutrophils Relative %: 7 %
Platelets: 9 10*3/uL — CL (ref 150–400)
RBC: 2.97 MIL/uL — ABNORMAL LOW (ref 4.22–5.81)
RDW: 15 % (ref 11.5–15.5)
WBC: 0.2 10*3/uL — CL (ref 4.0–10.5)
nRBC: 0 % (ref 0.0–0.2)

## 2022-07-20 LAB — MRSA NEXT GEN BY PCR, NASAL: MRSA by PCR Next Gen: NOT DETECTED

## 2022-07-20 LAB — CBC
HCT: 24.6 % — ABNORMAL LOW (ref 39.0–52.0)
Hemoglobin: 8.6 g/dL — ABNORMAL LOW (ref 13.0–17.0)
MCH: 28.2 pg (ref 26.0–34.0)
MCHC: 35 g/dL (ref 30.0–36.0)
MCV: 80.7 fL (ref 80.0–100.0)
Platelets: 5 10*3/uL — CL (ref 150–400)
RBC: 3.05 MIL/uL — ABNORMAL LOW (ref 4.22–5.81)
RDW: 15.1 % (ref 11.5–15.5)
WBC: 0.2 10*3/uL — CL (ref 4.0–10.5)
nRBC: 0 % (ref 0.0–0.2)

## 2022-07-20 LAB — COMPREHENSIVE METABOLIC PANEL
ALT: 326 U/L — ABNORMAL HIGH (ref 0–44)
AST: 210 U/L — ABNORMAL HIGH (ref 15–41)
Albumin: 2.4 g/dL — ABNORMAL LOW (ref 3.5–5.0)
Alkaline Phosphatase: 105 U/L (ref 38–126)
Anion gap: 10 (ref 5–15)
BUN: 30 mg/dL — ABNORMAL HIGH (ref 8–23)
CO2: 20 mmol/L — ABNORMAL LOW (ref 22–32)
Calcium: 7.6 mg/dL — ABNORMAL LOW (ref 8.9–10.3)
Chloride: 103 mmol/L (ref 98–111)
Creatinine, Ser: 1.23 mg/dL (ref 0.61–1.24)
GFR, Estimated: >60 mL/min (ref >60)
Glucose, Bld: 111 mg/dL — ABNORMAL HIGH (ref 70–99)
Potassium: 2.9 mmol/L — ABNORMAL LOW (ref 3.5–5.1)
Sodium: 133 mmol/L — ABNORMAL LOW (ref 135–145)
Total Bilirubin: 1.6 mg/dL — ABNORMAL HIGH (ref 0.3–1.2)
Total Protein: 5.2 g/dL — ABNORMAL LOW (ref 6.5–8.1)

## 2022-07-20 LAB — PREPARE PLATELET PHERESIS: Unit division: 0

## 2022-07-20 LAB — C DIFFICILE QUICK SCREEN W PCR REFLEX
C Diff antigen: NEGATIVE
C Diff interpretation: NOT DETECTED
C Diff toxin: NEGATIVE

## 2022-07-20 LAB — TYPE AND SCREEN
ABO/RH(D): B POS
Antibody Screen: NEGATIVE
Unit division: 0
Unit division: 0

## 2022-07-20 LAB — BPAM RBC
Blood Product Expiration Date: 202311292359
Blood Product Expiration Date: 202311302359
ISSUE DATE / TIME: 202311261447
ISSUE DATE / TIME: 202311261652
Unit Type and Rh: 7300
Unit Type and Rh: 7300

## 2022-07-20 LAB — DIFFERENTIAL
Abs Immature Granulocytes: 0 10*3/uL (ref 0.00–0.07)
Basophils Absolute: 0 10*3/uL (ref 0.0–0.1)
Basophils Relative: 0 %
Eosinophils Absolute: 0 10*3/uL (ref 0.0–0.5)
Eosinophils Relative: 0 %
Immature Granulocytes: 0 %
Lymphocytes Relative: 64 %
Lymphs Abs: 0.1 10*3/uL — ABNORMAL LOW (ref 0.7–4.0)
Monocytes Absolute: 0 10*3/uL — ABNORMAL LOW (ref 0.1–1.0)
Monocytes Relative: 24 %
Neutro Abs: 0 10*3/uL — CL (ref 1.7–7.7)
Neutrophils Relative %: 12 %

## 2022-07-20 LAB — BPAM PLATELET PHERESIS
Blood Product Expiration Date: 202311272359
ISSUE DATE / TIME: 202311262308
Unit Type and Rh: 5100

## 2022-07-20 LAB — MAGNESIUM: Magnesium: 1.9 mg/dL (ref 1.7–2.4)

## 2022-07-20 LAB — URIC ACID: Uric Acid, Serum: 4.9 mg/dL (ref 3.7–8.6)

## 2022-07-20 MED ORDER — MAGNESIUM SULFATE 2 GM/50ML IV SOLN
2.0000 g | Freq: Once | INTRAVENOUS | Status: AC
  Administered 2022-07-20: 2 g via INTRAVENOUS
  Filled 2022-07-20: qty 50

## 2022-07-20 MED ORDER — PIPERACILLIN-TAZOBACTAM 3.375 G IVPB
3.3750 g | Freq: Three times a day (TID) | INTRAVENOUS | Status: DC
  Administered 2022-07-20 – 2022-07-23 (×10): 3.375 g via INTRAVENOUS
  Filled 2022-07-20 (×10): qty 50

## 2022-07-20 MED ORDER — SODIUM CHLORIDE 0.9% IV SOLUTION: Freq: Once | INTRAVENOUS | Status: AC

## 2022-07-20 MED ORDER — CHLORHEXIDINE GLUCONATE CLOTH 2 % EX PADS
6.0000 | MEDICATED_PAD | Freq: Every day | CUTANEOUS | Status: DC
  Administered 2022-07-20 – 2022-07-29 (×10): 6 via TOPICAL

## 2022-07-20 MED ORDER — ORAL CARE MOUTH RINSE: 15.0000 mL | OROMUCOSAL | Status: DC | PRN

## 2022-07-20 MED ORDER — SODIUM CHLORIDE 0.9% IV SOLUTION: Freq: Once | INTRAVENOUS | Status: DC

## 2022-07-20 MED ORDER — POTASSIUM CHLORIDE CRYS ER 20 MEQ PO TBCR
40.0000 meq | EXTENDED_RELEASE_TABLET | ORAL | Status: AC
  Administered 2022-07-20 (×3): 40 meq via ORAL
  Filled 2022-07-20 (×3): qty 2

## 2022-07-20 MED ORDER — CALCIUM GLUCONATE-NACL 1-0.675 GM/50ML-% IV SOLN
1.0000 g | Freq: Once | INTRAVENOUS | Status: AC
  Administered 2022-07-20: 1000 mg via INTRAVENOUS
  Filled 2022-07-20: qty 50

## 2022-07-20 NOTE — Assessment & Plan Note (Addendum)
-   Follows closely with oncology at [facility].  Last office note reviewed from 06/25/2022 - Last marrow 01/26/22 negative by morphology and cytogenetics but JAK2 positive. -Oncology consulted on admission.  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  Uric acid on admission normal, 4.9 mg/dL - Continue trending CBC and will transfuse blood and platelets as indicated

## 2022-07-20 NOTE — ED Notes (Signed)
Pt given lunch tray.

## 2022-07-20 NOTE — ED Notes (Signed)
ED TO INPATIENT HANDOFF REPORT  Name/Age/Gender Benjamin [name] 71 y.o. male  Code Status    Code Status Orders  (From admission, onward)           Start     Ordered   07/19/22 1806  Full code  Continuous        07/19/22 1808           Code Status History     Date Active Date Inactive Code Status Order ID Comments User Context   01/02/2016 0052 01/04/2016 1904 Full Code [id number]  Velvet Bathe, MD ED   12/15/2015 0933 12/20/2015 1758 Full Code [id number]  Leighton Ruff, MD Inpatient       Home/SNF/Other Home  Chief Complaint Febrile neutropenia ([place]) [D70.9, R50.81]  Level of Care/Admitting Diagnosis ED Disposition     ED Disposition  Admit   Condition  --   Comment  Hospital Area: [place] [[id number]]  Level of Care: Stepdown [14]  Admit to SDU based on following criteria: Severe physiological/psychological symptoms:  Any diagnosis requiring assessment & intervention at least every 4 hours on an ongoing basis to obtain desired patient outcomes including stability and rehabilitation  Admit to SDU based on following criteria: Other see comments  Comments: Neutropenia with ANC of 0.0 and severe thrombocytopenia need for close monitoring due to high risk of compensation and bleeding.  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)    Perforated stomach ([place])    denies perforated bowel   Polycythemia    Sepsis ([place])     Allergies Allergies  Allergen Reactions   Aleve [Naproxen Sodium] Other (See Comments)    Pt thinks it might be all NSAIDs but is unsure. Caused him to have emergency stomach surgery.   Biaxin [Clarithromycin] Other (See Comments)    Joint pains   Cephalexin Hives and Rash   Flagyl [Metronidazole] Other (See Comments)    Joint pain   Morphine And Related Hives    IV Location/Drains/Wounds Patient Lines/Drains/Airways Status     Active Line/Drains/Airways     Name Placement date Placement time Site Days   Peripheral IV 07/19/22 20 G Right Antecubital 07/19/22  0922  Antecubital  1   PICC Double Lumen 07/15/20 PICC Left Other (Comment) 41 cm 0.5 cm 07/15/20  --  -- 735   Flatus Tube/Pouch 07/20/22  1254  --  less than 1            Labs/Imaging Results for orders placed or performed during the hospital encounter of 07/19/22 (from the past 48 hour(s))  Comprehensive metabolic  panel     Status: Abnormal   Collection Time: 07/19/22  9:05 AM  Result Value Ref Range   Sodium 133 (L) 135 - 145 mmol/L   Potassium 2.9 (L) 3.5 - 5.1 mmol/L   Chloride 102 98 - 111 mmol/L   CO2 20 (L) 22 - 32 mmol/L   Glucose, Bld 177 (H) 70 - 99 mg/dL    Comment: Glucose reference range applies only to samples taken after fasting for at least 8 hours.   BUN 44 (H) 8 - 23 mg/dL   Creatinine, Ser 1.62 (H) 0.61 - 1.24 mg/dL   Calcium 7.6 (L) 8.9 - 10.3 mg/dL   Total Protein 5.9 (L) 6.5 - 8.1 g/dL   Albumin 2.9 (L) 3.5 - 5.0 g/dL   AST 55 (H) 15 - 41 U/L   ALT 90 (H) 0 - 44 U/L   Alkaline Phosphatase 138 (H) 38 - 126 U/L   Total Bilirubin 1.3 (H) 0.3 - 1.2 mg/dL    GFR, Estimated 45 (L) >60 mL/min    Comment: (NOTE) Calculated using the CKD-EPI Creatinine Equation (2021)    Anion gap 11 5 - 15    Comment: Performed at [facility], [place] [address] [place], [place] [address]  Lactic acid, plasma     Status: None   Collection Time: 07/19/22  9:05 AM  Result Value Ref Range   Lactic Acid, Venous 1.2 0.5 - 1.9 mmol/L    Comment: Performed at [facility], [place] [address], [place], [place] [address]  CBC with Differential     Status: Abnormal   Collection Time: 07/19/22  9:05 AM  Result Value Ref Range   WBC 0.2 (LL) 4.0 - 10.5 K/uL    Comment: REPEATED TO VERIFY WHITE COUNT CONFIRMED ON SMEAR THIS CRITICAL RESULT HAS VERIFIED AND BEEN CALLED TO DOSS, D RN BY XIONG,KONG ON 11 26 2023 AT 1042, AND HAS BEEN READ BACK.  THIS CRITICAL RESULT HAS VERIFIED AND BEEN CALLED TO DOSS, D RN BY XIONG,KONG ON 11 26 2023 AT 1044, AND HAS BEEN READ BACK.     RBC 2.58 (L) 4.22 - 5.81 MIL/uL   Hemoglobin 7.3 (L) 13.0 - 17.0 g/dL   HCT 21.1 (L) 39.0 - 52.0 %   MCV 81.8 80.0 - 100.0 fL   MCH 28.3 26.0 - 34.0 pg   MCHC 34.6 30.0 - 36.0 g/dL   RDW 14.8 11.5 - 15.5 %   Platelets <5 (LL) 150 - 400 K/uL    Comment: SPECIMEN CHECKED FOR CLOTS REPEATED TO VERIFY PLATELET COUNT CONFIRMED BY SMEAR THIS CRITICAL RESULT HAS VERIFIED AND BEEN CALLED TO DOSS, D RN BY XIONG,KONG ON 11 26 2023 AT 1042, AND HAS BEEN READ BACK.     nRBC 0.0 0.0 - 0.2 %   Neutrophils Relative % 0 %   Neutro Abs 0.0 (LL) 1.7 - 7.7 K/uL    Comment: This critical result has verified and been called to DOSS, D RN by Sheryn Bison on 11 26 2023 at 1043, and has been read back.    Lymphocytes Relative 94 %   Lymphs Abs 0.2 (L) 0.7 - 4.0 K/uL   Monocytes Relative 6 %   Monocytes Absolute 0.0 (L) 0.1 - 1.0 K/uL   Eosinophils Relative 0 %   Eosinophils Absolute 0.0 0.0 - 0.5 K/uL   Basophils Relative 0 %   Basophils Absolute 0.0 0.0 - 0.1 K/uL   Abs Immature  Granulocytes 0.00 0.00 -  0.07 K/uL    Comment: Performed at [facility], [place] [address] [place], [place] [address]  Protime-INR     Status: Abnormal   Collection Time: 07/19/22  9:05 AM  Result Value Ref Range   Prothrombin Time 22.9 (H) 11.4 - 15.2 seconds   INR 2.0 (H) 0.8 - 1.2    Comment: (NOTE) INR goal varies based on device and disease states. Performed at [facility], [place] [address], [place], [place] [address]   Culture, blood (Routine x 2)     Status: None (Preliminary result)   Collection Time: 07/19/22  9:05 AM   Specimen: BLOOD  Result Value Ref Range   Specimen Description      BLOOD SITE NOT SPECIFIED Performed at [place] [address], [place], [place] [address]    Special Requests      BOTTLES DRAWN AEROBIC AND ANAEROBIC Blood Culture adequate volume Performed at [place] [address], [place], [place] [address]    Culture      NO GROWTH < 24 HOURS Performed at [place] [address], [place], [place] [address]    Report Status PENDING   Culture, blood (Routine x 2)     Status: None (Preliminary result)   Collection Time: 07/19/22  9:05 AM   Specimen: BLOOD  Result Value Ref Range   Specimen Description      BLOOD SITE NOT SPECIFIED Performed at [facility] [address], [place], [place] [address]    Special Requests      BOTTLES DRAWN AEROBIC AND ANAEROBIC Blood Culture adequate volume Performed at [place] [address], [place], [place] [address]    Culture      NO GROWTH < 24 HOURS Performed at [facility] [address] [place], [place] [address]    Report Status PENDING   Type and screen Magazine     Status: None   Collection Time: 07/19/22  9:05 AM  Result Value Ref Range   ABO/RH(D) B POS    Antibody Screen NEG    Sample Expiration 07/22/2022,2359    Unit  Number [id number]    Blood Component Type RBC, LR IRR    Unit division 00    Status of Unit ISSUED,FINAL    Transfusion Status OK TO TRANSFUSE    Crossmatch Result Compatible    Unit Number [id number]    Blood Component Type RBC, LR IRR    Unit division 00    Status of Unit ISSUED,FINAL    Transfusion Status OK TO TRANSFUSE    Crossmatch Result      Compatible Performed at [facility], [place] [address], [place],  [address]   Resp Panel by RT-PCR (Flu A&B, Covid) Anterior Nasal Swab     Status: None   Collection Time: 07/19/22  9:15 AM   Specimen: Anterior Nasal Swab  Result Value Ref Range   SARS Coronavirus 2 by RT PCR NEGATIVE NEGATIVE    Comment: (NOTE) SARS-CoV-2 target nucleic acids are NOT DETECTED.  The SARS-CoV-2 RNA is generally detectable in upper respiratory specimens during the acute phase of infection. The lowest concentration of SARS-CoV-2 viral copies this assay can detect is 138 copies/mL. A negative result does not preclude SARS-Cov-2 infection and should not be used as the sole basis for treatment or other patient management decisions. A negative result may occur with  improper specimen collection/handling, submission of specimen other than nasopharyngeal swab, presence of viral mutation(s) within the areas targeted by this assay, and inadequate number of viral copies(<138 copies/mL). A negative result must be combined with clinical observations, patient history, and epidemiological information. The expected result is Negative.  Fact Sheet for Patients:  EntrepreneurPulse.com.au  Fact Sheet for Healthcare Providers:  IncredibleEmployment.be  This test is no t yet approved or cleared by the [place] FDA and  has been authorized for detection and/or diagnosis of SARS-CoV-2 by FDA under an Emergency Use Authorization (EUA). This EUA will remain  in effect (meaning this test can be used)  for the duration of the COVID-19 declaration under Section 564(b)(1) of the Act, 21 U.S.C.section 360bbb-3(b)(1), unless the authorization is terminated  or revoked sooner.       Influenza A by PCR NEGATIVE NEGATIVE   Influenza B by PCR NEGATIVE NEGATIVE    Comment: (NOTE) The Xpert Xpress SARS-CoV-2/FLU/RSV plus assay is intended as an aid in the diagnosis of influenza from Nasopharyngeal swab specimens and should not be used as a sole basis for treatment. Nasal washings and aspirates are unacceptable for Xpert Xpress SARS-CoV-2/FLU/RSV testing.  Fact Sheet for Patients: EntrepreneurPulse.com.au  Fact Sheet for Healthcare Providers: IncredibleEmployment.be  This test is not yet approved or cleared by the [place] FDA and has been authorized for detection and/or diagnosis of SARS-CoV-2 by FDA under an Emergency Use Authorization (EUA). This EUA will remain in effect (meaning this test can be used) for the duration of the COVID-19 declaration under Section 564(b)(1) of the Act, 21 U.S.C. section 360bbb-3(b)(1), unless the authorization is terminated or revoked.  Performed at [facility], [place] [address], [place], [place] [address]   Urinalysis, Routine w reflex microscopic Urine, Clean Catch     Status: Abnormal   Collection Time: 07/19/22 12:47 PM  Result Value Ref Range   Color, Urine AMBER (A) YELLOW    Comment: BIOCHEMICALS MAY BE AFFECTED BY COLOR   APPearance HAZY (A) CLEAR   Specific Gravity, Urine 1.016 1.005 - 1.030   pH 5.0 5.0 - 8.0   Glucose, UA NEGATIVE NEGATIVE mg/dL   Hgb urine dipstick MODERATE (A) NEGATIVE   Bilirubin Urine NEGATIVE NEGATIVE   Ketones, ur NEGATIVE NEGATIVE mg/dL   Protein, ur 30 (A) NEGATIVE mg/dL   Nitrite NEGATIVE NEGATIVE   Leukocytes,Ua NEGATIVE NEGATIVE   RBC / HPF 0-5 0 - 5 RBC/hpf   WBC, UA 0-5 0 - 5 WBC/hpf   Bacteria, UA RARE (A) NONE SEEN   Squamous Epithelial / LPF  0-5 0 - 5   Mucus PRESENT    Hyaline Casts, UA PRESENT    Crystals PRESENT (A) NEGATIVE    Comment: Performed at [facility], [place] [address], [place], [place] [address]  Prepare RBC (crossmatch)     Status: None   Collection Time: 07/19/22  1:07 PM  Result Value Ref Range   Order Confirmation      ORDER PROCESSED BY BLOOD BANK Performed at [facility] [address] [place], [place] [address]   C Difficile Quick Screen w PCR reflex     Status: None   Collection Time: 07/19/22  1:52 PM   Specimen: STOOL  Result Value Ref Range   C Diff antigen NEGATIVE NEGATIVE   C Diff toxin NEGATIVE NEGATIVE   C Diff interpretation No C. difficile detected.     Comment: Performed at [facility], [address] [place], [place] [address]  Prepare platelet pheresis     Status: None   Collection Time: 07/19/22  5:21 PM  Result Value Ref Range   Unit Number [id number]    Blood Component Type PLTP1 PSORALEN TREATED    Unit division 00    Status of Unit ISSUED,FINAL    Transfusion Status      OK TO TRANSFUSE Performed at [facility] [address] [place], [place] [address]   Comprehensive metabolic panel     Status: Abnormal   Collection Time: 07/20/22  7:37 AM  Result Value Ref Range   Sodium 133 (L) 135 - 145 mmol/L   Potassium 2.9 (L) 3.5 - 5.1 mmol/L   Chloride 103 98 - 111 mmol/L   CO2 20 (L) 22 - 32 mmol/L   Glucose, Bld 111 (H) 70 - 99 mg/dL    Comment: Glucose reference range applies only to samples taken after fasting for at least 8 hours.   BUN 30 (H) 8 - 23 mg/dL   Creatinine, Ser 1.23 0.61 - 1.24 mg/dL   Calcium 7.6 (L) 8.9 - 10.3 mg/dL   Total Protein 5.2 (L) 6.5 - 8.1 g/dL   Albumin 2.4 (L) 3.5 - 5.0 g/dL   AST 210 (H) 15 - 41 U/L   ALT 326 (H) 0 - 44 U/L   Alkaline Phosphatase 105 38 - 126 U/L   Total Bilirubin 1.6 (H) 0.3 - 1.2 mg/dL   GFR, Estimated >60 >60 mL/min    Comment:  (NOTE) Calculated using the CKD-EPI Creatinine Equation (2021)    Anion gap 10 5 - 15    Comment: Performed at [facility], [place] [address] [place], [place] [address]  CBC     Status: Abnormal   Collection Time: 07/20/22  7:37 AM  Result Value Ref Range   WBC 0.2 (LL) 4.0 - 10.5 K/uL    Comment: CRITICAL VALUE NOTED.  VALUE IS CONSISTENT WITH PREVIOUSLY REPORTED AND CALLED VALUE. REPEATED TO VERIFY    RBC 3.05 (L) 4.22 - 5.81 MIL/uL   Hemoglobin 8.6 (L) 13.0 - 17.0 g/dL   HCT 24.6 (L) 39.0 - 52.0 %   MCV 80.7 80.0 - 100.0 fL   MCH 28.2 26.0 - 34.0 pg   MCHC 35.0 30.0 - 36.0 g/dL   RDW 15.1 11.5 - 15.5 %   Platelets 5 (LL) 150 - 400 K/uL    Comment: Immature Platelet Fraction may be clinically indicated, consider ordering this additional test [id number] CRITICAL VALUE NOTED.  VALUE IS CONSISTENT WITH PREVIOUSLY REPORTED AND CALLED VALUE. REPEATED TO VERIFY    nRBC 0.0 0.0 - 0.2 %    Comment: Performed at [facility], [place] [address] [place], [place] [address]  Magnesium     Status: None   Collection Time: 07/20/22  7:37 AM  Result Value Ref Range   Magnesium 1.9 1.7 - 2.4 mg/dL    Comment: Performed at [facility], [place] [address] [place], [place] [address]  Uric acid     Status: None   Collection Time: 07/20/22  7:37 AM  Result Value Ref Range   Uric Acid, Serum 4.9 3.7 - 8.6 mg/dL    Comment: Performed at [facility], [place] [address] [place], [place] [address]  Differential     Status: Abnormal   Collection Time: 07/20/22  7:37 AM  Result Value Ref Range   Neutrophils Relative % 12 %  Neutro Abs 0.0 (LL) 1.7 - 7.7 K/uL    Comment: REPEATED TO VERIFY CRITICAL RESULT CALLED TO, READ BACK BY AND VERIFIED WITH: SHAW,S. RN '@[id number]'$  ON 11.27.2023 BY NMCCOY    Lymphocytes Relative 64 %   Lymphs Abs 0.1 (L) 0.7 - 4.0 K/uL   Monocytes Relative 24 %   Monocytes Absolute 0.0 (L) 0.1 - 1.0 K/uL    Eosinophils Relative 0 %   Eosinophils Absolute 0.0 0.0 - 0.5 K/uL   Basophils Relative 0 %   Basophils Absolute 0.0 0.0 - 0.1 K/uL   Immature Granulocytes 0 %   Abs Immature Granulocytes 0.00 0.00 - 0.07 K/uL    Comment: Performed at [facility], [place] [address] [place], [place] [address]  Prepare platelet pheresis     Status: None (Preliminary result)   Collection Time: 07/20/22  9:01 AM  Result Value Ref Range   Unit Number [id number]    Blood Component Type PLTP2 PSORALEN TREATED    Unit division 00    Status of Unit ISSUED    Transfusion Status OK TO TRANSFUSE    Unit Number [id number]    Blood Component Type PLTP3 PSORALEN TREATED    Unit division 00    Status of Unit ISSUED    Transfusion Status      OK TO TRANSFUSE Performed at [facility] [address], [place], [place] [address]    US Abdomen Limited RUQ (LIVER/GB)  Result Date: 07/19/2022 CLINICAL DATA:  Right upper quadrant pain EXAM: ULTRASOUND ABDOMEN LIMITED RIGHT UPPER QUADRANT COMPARISON:  CT 07/19/2022 FINDINGS: Gallbladder: Positive for gallstones. Increased wall thickness measuring 4 mm. Pericholecystic fluid. Negative sonographic Murphy. Common bile duct: Diameter: 3 mm Liver: No focal lesion identified. Within normal limits in parenchymal echogenicity. Portal vein is patent on color Doppler imaging with normal direction of blood flow towards the liver. Other: None. IMPRESSION: 1. Cholelithiasis with diffuse gallbladder wall thickening and pericholecystic fluid, but negative sonographic Percell Miller, findings are indeterminate for acute cholecystitis. Consider correlation with nuclear medicine hepatobiliary imaging. Electronically Signed   By: Donavan Foil M.D.   On: 07/19/2022 15:31   CT Renal Stone Study  Result Date: 07/19/2022 CLINICAL DATA:  Abdominal and flank pain. EXAM: CT ABDOMEN AND PELVIS WITHOUT CONTRAST TECHNIQUE: Multidetector CT imaging of the abdomen and  pelvis was performed following the standard protocol without IV contrast. RADIATION DOSE REDUCTION: This exam was performed according to the departmental dose-optimization program which includes automated exposure control, adjustment of the mA and/or kV according to patient size and/or use of iterative reconstruction technique. COMPARISON:  CTA abdomen pelvis 12/15/2015 FINDINGS: Lower chest: Similar appearance of chronic atelectasis or scarring in the posterior right lung base. Hepatobiliary: No suspicious focal abnormality in the liver on this study without intravenous contrast. Attenuation of liver parenchyma appears mildly increased which may be related to amiodarone therapy or iron deposition disease. Tiny gallstones are seen in the gallbladder lumen with subtle pericholecystic fluid towards the fundus. No intrahepatic or extrahepatic biliary dilation. Pancreas: No focal mass lesion. No dilatation of the main duct. No intraparenchymal cyst. No peripancreatic edema. Spleen: Splenomegaly. Spleen measures 19.6 x 16.4 x 13.6 cm with estimated volume of 2566 cc. Adrenals/Urinary Tract: No adrenal nodule or mass. Kidneys unremarkable. No evidence for hydroureter. Bladder has a somewhat irregular contour anteriorly without appreciable bladder wall thickening. Stomach/Bowel: Stomach is unremarkable. No gastric wall thickening. No evidence of outlet obstruction. Duodenum is normally positioned as is the ligament of Treitz.  No small bowel wall thickening. No small bowel dilatation. The terminal ileum is normal. The appendix is normal. No gross colonic mass. No colonic wall thickening. There is fluid throughout the colon extending down to the distal rectum, imaging features compatible with clinical diarrhea. Vascular/Lymphatic: There is mild atherosclerotic calcification of the abdominal aorta without aneurysm. Marked prominence of the portal vein and splenic vein suggests portal venous hypertension. There is no  gastrohepatic or hepatoduodenal ligament lymphadenopathy. No retroperitoneal or mesenteric lymphadenopathy. No pelvic sidewall lymphadenopathy. Reproductive: Prostate gland is enlarged with central defect suggesting previous TURP. Other: Trace free fluid is seen adjacent to the liver and in the right paracolic gutter. Musculoskeletal: No worrisome lytic or sclerotic osseous abnormality. IMPRESSION: 1. Fluid throughout the colon extending down to the distal rectum, imaging features compatible with clinical diarrhea. No colonic wall thickening or pericolonic edema. 2. Cholelithiasis with subtle pericholecystic fluid towards the fundus. If there is clinical concern for acute cholecystitis, right upper quadrant ultrasound could be used to further evaluate. 3. Splenomegaly with estimated volume of 2566 cc. Marked prominence of the portal vein and splenic. Findings suggest portal venous hypertension. 4. Trace free fluid adjacent to the liver and in the right paracolic gutter. 5. Prostatomegaly with central defect suggesting previous TURP. 6. Attenuation of liver parenchyma appears mildly increased which can be related to amiodarone therapy or iron deposition disease. 7.  Aortic Atherosclerosis (ICD10-I70.0). Electronically Signed   By: Misty Stanley M.D.   On: 07/19/2022 13:06   DG Chest 2 View  Result Date: 07/19/2022 CLINICAL DATA:  Suspected sepsis. EXAM: CHEST - 2 VIEW COMPARISON:  01/01/2016 FINDINGS: The lungs are clear without focal pneumonia, edema, pneumothorax or pleural effusion. Left PICC line tip overlies the proximal SVC level. The lungs are clear without focal pneumonia, edema, pneumothorax or pleural effusion. The cardiopericardial silhouette is within normal limits for size. Telemetry leads overlie the chest. IMPRESSION: No active cardiopulmonary disease. Electronically Signed   By: Misty Stanley M.D.   On: 07/19/2022 09:43    Pending Labs Unresulted Labs (From admission, onward)     Start      Ordered   07/21/22 0500  CBC with Differential/Platelet  Daily at 5am,   R     Question:  Specimen collection method  Answer:  Lab=Lab collect   07/20/22 1742   07/21/22 0500  Magnesium  Daily at 5am,   R     Question:  Specimen collection method  Answer:  Lab=Lab collect   07/20/22 1742   07/21/22 0500  Comprehensive metabolic panel  Daily at 5am,   R      07/20/22 1742   07/20/22 2000  CBC with Differential/Platelet  Once-Timed,   TIMED        07/20/22 1820   07/19/22 1556  Gastrointestinal Panel by PCR , Stool  (Gastrointestinal Panel by PCR, Stool                                                                                                                                                     **  Does Not include CLOSTRIDIUM DIFFICILE testing. **If CDIFF testing is needed, place order from the "C Difficile Testing" order set.**)  Once,   URGENT        07/19/22 1555            Vitals/Pain Today's Vitals   07/20/22 1500 07/20/22 1545 07/20/22 1546 07/20/22 1800  BP: (!) 122/57 (!) 119/56 (!) 119/56 (!) 119/56  Pulse: 96 97 97 98  Resp: (!) '31 16 16 16  '$ Temp: 98 F (36.7 C) 98 F (36.7 C) 98 F (36.7 C) 98 F (36.7 C)  TempSrc:      SpO2: 97% 99% 99% 99%  PainSc:        Isolation Precautions Contact and Enteric precautions (UV disinfection)  Medications Medications  acyclovir (ZOVIRAX) tablet 400 mg (400 mg Oral Given 07/20/22 1042)  sodium chloride flush (NS) 0.9 % injection 3 mL (3 mLs Intravenous Given 07/20/22 1051)  acetaminophen (TYLENOL) tablet 650 mg (has no administration in time range)    Or  acetaminophen (TYLENOL) suppository 650 mg (has no administration in time range)  polyethylene glycol (MIRALAX / GLYCOLAX) packet 17 g (has no administration in time range)  posaconazole (NOXAFIL) delayed-release tablet 300 mg (300 mg Oral Given 07/19/22 2051)  piperacillin-tazobactam (ZOSYN) IVPB 3.375 g (3.375 g Intravenous New Bag/Given 07/20/22 1800)  0.9 %  sodium  chloride infusion (Manually program via Guardrails IV Fluids) (0 mLs Intravenous Hold 07/20/22 0953)  sodium chloride 0.9 % bolus 1,000 mL (0 mLs Intravenous Stopped 07/19/22 1152)  potassium chloride SA (KLOR-CON M) CR tablet 60 mEq (60 mEq Oral Given 07/19/22 1152)  0.9 %  sodium chloride infusion (0 mL/hr Intravenous Stopped 07/19/22 1730)  piperacillin-tazobactam (ZOSYN) IVPB 3.375 g (0 g Intravenous Stopped 07/19/22 1751)  acetaminophen (TYLENOL) tablet 650 mg (650 mg Oral Given 07/19/22 1732)  0.9 %  sodium chloride infusion (0 mL/hr Intravenous Stopped 07/19/22 1821)  calcium gluconate 1 g/ 50 mL sodium chloride IVPB (0 mg Intravenous Stopped 07/20/22 1147)  potassium chloride SA (KLOR-CON M) CR tablet 40 mEq (40 mEq Oral Given 07/20/22 1900)  magnesium sulfate IVPB 2 g 50 mL (0 g Intravenous Stopped 07/20/22 1146)    Mobility walks with person assist

## 2022-07-20 NOTE — Assessment & Plan Note (Signed)
-   Diagnosed around 2005.  Treated with therapeutic phlebotomy in the past

## 2022-07-20 NOTE — Assessment & Plan Note (Addendum)
-   Patient treated with prophylactic posaconazole, acyclovir, Levaquin per outpatient oncology notes - Continue posaconazole and acyclovir - Hold Levaquin in setting of profuse diarrhea - Follow-up stool studies

## 2022-07-20 NOTE — Assessment & Plan Note (Addendum)
-   PLTC < 5 on admission - continue trending PLTC and transfuse as necessary  - no evidence of bleeding at this time

## 2022-07-20 NOTE — Progress Notes (Signed)
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  Pharmacy has been consulted for Zosyn dosing.  Most recent weight documented 73kg 07/17/22 SCr 1.62, CrCl ~40 ml/min ANC = 0  Plan: Zosyn 3.375gm IV q8h (4hr extended infusions) No dose adjustments anticipated.  Pharmacy will sign off and monitor peripherally via electronic surveillance software for any changes in renal function or micro data.     Temp (24hrs), Avg:99.5 F (37.5 C), Min:98.2 F (36.8 C), Max:101.3 F (38.5 C)  Recent Labs  Lab 07/19/22 0905  WBC 0.2*  CREATININE 1.62*  LATICACIDVEN 1.2    CrCl cannot be calculated (Unknown ideal weight.).    Allergies  Allergen Reactions   Aleve [Naproxen Sodium] Other (See Comments)    Pt thinks it might be all NSAIDs but is unsure. Caused him to have emergency stomach surgery.   Biaxin [Clarithromycin] Other (See Comments)    Joint pains   Cephalexin Hives and Rash   Flagyl [Metronidazole] Other (See Comments)    Joint pain   Morphine And Related Hives    Antimicrobials this admission: 11/26 Zosyn x 1, continue 11/27 >>  Dose adjustments this admission:  Microbiology results: 11/26 BCx: 11/27 C.diff: 11/27 GI panel:  Thank you for allowing pharmacy to be a part of this patient's care.  Peggyann Juba, PharmD, BCPS Pharmacy: [phone] 07/20/2022 8:25 AM

## 2022-07-20 NOTE — Assessment & Plan Note (Addendum)
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   -cannot safely anticoagulate due to significant thrombocytopenia

## 2022-07-20 NOTE — Assessment & Plan Note (Signed)
- -   baseline creatinine ~ 0.8 - patient presents with increase in creat >0.3 mg/dL above baseline, creat increase >1.5x baseline presumed to have occurred within past 7 days PTA -Suspected due to GI losses and hypovolemia - Creatinine 1.62 on admission - Patient receiving multiple blood products which will provide volume expansion - Creatinine improving - Continue trending BMP

## 2022-07-20 NOTE — Hospital Course (Signed)
Benjamin [name] is a 71-year-old male with PMH Jak-2 positive MPN with MF (diagnosed 02/20/20, managed with hydrea) then found to have blast crisis (15% blasts 07/11/20) so treated like AML. 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  He follows with [facility] oncology, last office visit reviewed from 06/25/2022. Other PMH includes hx DVT, hepatic thrombosis, GERD, PUD.   He presented to the hospital due to generalized weakness for approximately 3 to 4 days prior to hospitalization.  He also developed diarrhea prior to admission; he had reported that he was on the toilet and unable to stand up due to weakness thus prompting call for EMS. He met criteria for sepsis on admission and was placed on zosyn.  Stool studies were ordered due to diarrhea.  CXR obtained on admission and was unremarkable.  CT renal stone protocol showed fluid throughout colon extending to distal rectum compatible with diarrhea (no colonic wall thickening or pericolonic edema).  There was cholelithiasis with subtle pericholecystic fluid towards the fundus.  Splenomegaly was also appreciated with prominence of the portal and splenic veins. Right upper quadrant ultrasound was then obtained which again showed cholelithiasis with diffuse gallbladder wall thickening and pericholecystic fluid but no sonographic Murphy sign and findings were considered indeterminate for acute cholecystitis.  Lab workup revealed pancytopenia.  ANC 0 on admission. Hgb 7.3 g/dL, PLTC < 5.  He was started on blood and platelet transfusions.  Oncology was also consulted on admission. 

## 2022-07-20 NOTE — Assessment & Plan Note (Signed)
-   Hgb 7.3 g/dL on admission; patient transfused 2 units PRBC - Repeat hemoglobin, 8.6 g/dL this morning - Continue trending CBC

## 2022-07-20 NOTE — Assessment & Plan Note (Signed)
-   Replete as needed 

## 2022-07-20 NOTE — Assessment & Plan Note (Addendum)
-   no PPI noted on med rec; hold regardless for now

## 2022-07-20 NOTE — Assessment & Plan Note (Signed)
-   Imaging workup mostly notable for equivocal cholecystitis as noted by cholelithiasis with diffuse gallbladder wall thickening and pericholecystic fluid but negative sonographic Murphy sign - Would be nonsurgical candidate and still probably a poor IR candidate due to severe pancytopenia -C. difficile negative.  GI panel detected norovirus - has been on Zosyn; will discuss with oncology regarding continuing vs d/c

## 2022-07-20 NOTE — Assessment & Plan Note (Signed)
-   History of this previously but had improved.  Thought that it could be due to his chemotherapy agents. - Less elevated than previously; on admission AST 55, ALT 90, ALP 138. - Holding chemotherapy medications - Continue to trend

## 2022-07-20 NOTE — Assessment & Plan Note (Addendum)
-   History of LLE DVT at time of PV diagnosis (~ 2005) -See hepatic vein thrombosis

## 2022-07-20 NOTE — Assessment & Plan Note (Addendum)
-   Cirrhosis by abdominal U/S performed 07/23/20.  Repeat U/S 09/13/20 revealed cirrhotic liver morphology with sequela portal hypertension; mural based thrombus noted in right hepatic vein and moderate volume complex ascites noted.  Fibroscan 09/30/20 c/w advanced fibrosis/cirrhosis.  Liver biopsy deferred as risks outweigh the benefits. Repeat abdominal U/S 02/11/21 overall stable with improved ascites.  Saw GI 08/01/21 with no new recommendations. Abdominal U/S 08/13/21 c/w cirrhosis with small amount of ascites in upper abdomen.  Fibroscan performed 09/30/21.  Saw GI 01/30/22 with recommendations for low sodium diet, abdominal U/S (performed 02/17/22), AFP (done 02/02/22 and normal), fibroscan in 09/2022 and 3-monthfollow up (scheduled for 07/31/22).

## 2022-07-20 NOTE — Progress Notes (Signed)
    OVERNIGHT PROGRESS REPORT  Notified by RN for critical lab of   Platelets of 9 K/uL  Patient has a know history and current diagnosis/problem of :  Thrombocytopenia ([place]) - PLTC < 5 on admission - will continue trending PLTC and transfuse as necessary  - There is still no evidence of bleeding at this time    Transfusion of platelets ordered with RN to time recheck after.    Gershon Cull MSNA MSN ACNPC-AG Acute Care Nurse Practitioner [place]

## 2022-07-20 NOTE — Progress Notes (Signed)
Progress Note    Benjamin [name]   LEX:[id number]  DOB: 07/11/1951  DOA: 07/19/2022     1 PCP: Burnard Bunting, MD  Initial CC: diarrhea, weakness  Hospital Course: Benjamin [name] is a 71 year old male with PMH Jak-2 positive MPN with MF (diagnosed 02/20/20, managed with hydrea) then found to have blast crisis (15% blasts 07/11/20) so treated like AML. 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  He follows with [facility] oncology, last office visit reviewed from 06/25/2022. Other PMH includes hx DVT, hepatic thrombosis, GERD, PUD.   He presented to the hospital due to generalized weakness for approximately 3 to 4 days prior to hospitalization.  He also developed diarrhea prior to admission; he had reported that he was on the toilet and unable to stand up due to weakness thus prompting call for EMS. He met criteria for sepsis on admission and was placed on zosyn.  Stool studies were ordered due to diarrhea.  CXR obtained on admission and was unremarkable.  CT renal stone protocol showed fluid throughout colon extending to distal rectum compatible with diarrhea (no colonic wall thickening or pericolonic edema).  There was cholelithiasis with subtle pericholecystic fluid towards the fundus.  Splenomegaly was also appreciated with prominence of the portal and splenic veins. Right upper quadrant ultrasound was then obtained which again showed cholelithiasis with diffuse gallbladder wall thickening and pericholecystic fluid but no sonographic Murphy sign and findings were considered indeterminate for acute cholecystitis.  Lab workup revealed pancytopenia.  ANC 0 on admission. Hgb 7.3 g/dL, PLTC < 5.  He was started on blood and platelet transfusions.  Oncology was also consulted on admission.  Interval History:  Seen in the ER this morning.  Patient slightly confused appearing and was laying in a puddle of profuse diarrhea  unaware, with diarrhea noted on floor as well.  No blood appreciated. Denies any abdominal pain nor nausea/vomiting.  Assessment and Plan: * Febrile neutropenia ([place]) - Imaging workup mostly notable for equivocal cholecystitis as noted by cholelithiasis with diffuse gallbladder wall thickening and pericholecystic fluid but negative sonographic Murphy sign - Would be nonsurgical candidate and still probably a poor IR candidate due to severe pancytopenia - Continue Zosyn and monitor clinically for now.  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 -Oncology consulted on admission.  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  Uric acid on admission normal, 4.9 mg/dL - Continue trending CBC and will transfuse blood and platelets as indicated  Normocytic anemia - Hgb 7.3 g/dL on admission; patient transfused 2 units PRBC - Repeat hemoglobin, 8.6 g/dL this morning - Continue trending CBC  Thrombocytopenia ([name]) - PLTC < 5 on admission - continue trending PLTC and transfuse as necessary  - no evidence of bleeding at this time   AKI (acute kidney injury) ([place]) - - baseline creatinine ~ 0.8 - patient presents with increase in creat >0.3 mg/dL above baseline, creat increase >1.5x baseline presumed to have occurred within past 7 days PTA -Suspected due to GI losses and hypovolemia - Creatinine 1.62 on admission - Patient receiving multiple blood products which will provide volume expansion - Creatinine improving - Continue trending BMP  Cirrhosis ([place]) - Cirrhosis by abdominal U/S performed 07/23/20.  Repeat U/S 09/13/20 revealed cirrhotic liver morphology with sequela  portal hypertension; mural based thrombus noted in right hepatic vein and moderate volume complex ascites noted.  Fibroscan 09/30/20 c/w advanced fibrosis/cirrhosis.  Liver biopsy deferred as risks outweigh the benefits. Repeat abdominal U/S 02/11/21 overall stable with improved ascites.  Saw GI 08/01/21 with no new recommendations. Abdominal U/S 08/13/21 c/w cirrhosis with small amount of ascites in upper abdomen.  Fibroscan performed 09/30/21.  Saw GI 01/30/22 with recommendations for low sodium diet, abdominal U/S (performed 02/17/22), AFP (done 02/02/22 and normal), fibroscan in 09/2022 and 5-monthfollow up (scheduled for 07/31/22).  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   -cannot safely anticoagulate due to significant thrombocytopenia   History of DVT (deep vein thrombosis) - History of LLE DVT at time of PV diagnosis (~ 2005) -See hepatic vein thrombosis  Prophylactic antibiotic - Patient treated with prophylactic posaconazole, acyclovir, Levaquin per outpatient oncology notes - Continue posaconazole and acyclovir - Hold Levaquin in setting of profuse diarrhea - Follow-up stool studies  GERD (gastroesophageal reflux disease) - no PPI noted on med rec; hold regardless for now  Transaminitis - History of this previously but had improved.  Thought that it could be due to his chemotherapy agents. - Less elevated than previously; on admission AST 55, ALT 90, ALP 138. 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  Treated with therapeutic phlebotomy in the past   Old records reviewed in assessment of this patient  Antimicrobials: Zosyn 07/19/2022 >> current  DVT prophylaxis:  SCDs Start: 07/19/22 1805   Code Status:   Code Status: Full Code  Mobility Assessment (last 72 hours)     Mobility Assessment     Row  Name 07/20/22 07:37:20           Does patient have an order for bedrest or is patient medically unstable No - Continue assessment                Barriers to discharge:  Disposition Plan: Home Status is: Inpatient  Objective: Blood pressure 128/64, pulse 88, temperature 100.2 F (37.9 C), temperature source Oral, resp. rate (!) 26, SpO2 95 %.  Examination:  Physical Exam Constitutional:      Comments: Chronically ill-appearing elderly gentleman lying in bed in no distress.  Appears somewhat confused and dazed  HENT:     Head: Normocephalic and atraumatic.     Mouth/Throat:     Mouth: Mucous membranes are moist.  Eyes:     Extraocular Movements: Extraocular movements intact.  Cardiovascular:     Rate and Rhythm: Normal rate and regular rhythm.  Pulmonary:     Effort: Pulmonary effort is normal.     Breath sounds: Normal breath sounds.  Abdominal:     General: Bowel sounds are normal. There is no distension.     Palpations: Abdomen is soft.     Tenderness: There is no abdominal tenderness.  Musculoskeletal:        General: Normal range of motion.     Cervical back: Normal range of motion and neck supple.  Skin:    General: Skin is warm.  Neurological:     Comments: Some confusion appreciated.  Moves all 4 extremities and follows commands      Consultants:  Oncology  Procedures:    Data Reviewed: Results for orders placed or performed during the hospital encounter of 07/19/22 (from the past 24 hour(s))  Prepare platelet pheresis     Status: None   Collection Time: 07/19/22  5:21 PM  Result Value Ref Range   Unit Number [id number]    Blood Component Type PLTP1 PSORALEN TREATED    Unit division 00    Status of Unit ISSUED,FINAL    Transfusion Status      OK TO TRANSFUSE Performed at [facility] [address], [place], [place] [address]   Comprehensive metabolic panel     Status: Abnormal   Collection Time: 07/20/22  7:37 AM   Result Value Ref Range   Sodium 133 (L) 135 - 145 mmol/L   Potassium 2.9 (L) 3.5 - 5.1 mmol/L   Chloride 103 98 - 111 mmol/L   CO2 20 (L) 22 - 32 mmol/L   Glucose, Bld 111 (H) 70 - 99 mg/dL   BUN 30 (H) 8 - 23 mg/dL   Creatinine, Ser 1.23 0.61 - 1.24 mg/dL   Calcium 7.6 (L) 8.9 - 10.3 mg/dL   Total Protein 5.2 (L) 6.5 - 8.1 g/dL   Albumin 2.4 (L) 3.5 - 5.0 g/dL   AST 210 (H) 15 - 41 U/L   ALT 326 (H) 0 - 44 U/L   Alkaline Phosphatase 105 38 - 126 U/L   Total Bilirubin 1.6 (H) 0.3 - 1.2 mg/dL   GFR, Estimated >60 >60 mL/min   Anion gap 10 5 - 15  CBC     Status: Abnormal   Collection Time: 07/20/22  7:37 AM  Result Value Ref Range   WBC 0.2 (LL) 4.0 - 10.5 K/uL   RBC 3.05 (L) 4.22 - 5.81 MIL/uL   Hemoglobin 8.6 (L) 13.0 - 17.0 g/dL   HCT 24.6 (L) 39.0 - 52.0 %   MCV 80.7 80.0 - 100.0 fL   MCH 28.2 26.0 - 34.0 pg   MCHC 35.0 30.0 - 36.0 g/dL   RDW 15.1 11.5 - 15.5 %   Platelets 5 (LL) 150 - 400 K/uL   nRBC 0.0 0.0 - 0.2 %  Magnesium     Status: None   Collection Time: 07/20/22  7:37 AM  Result Value Ref Range   Magnesium 1.9 1.7 - 2.4 mg/dL  Uric acid     Status: None   Collection Time: 07/20/22  7:37 AM  Result Value Ref Range   Uric Acid, Serum 4.9 3.7 - 8.6 mg/dL  Differential     Status: Abnormal   Collection Time: 07/20/22  7:37 AM  Result Value Ref Range   Neutrophils Relative % 12 %   Neutro Abs 0.0 (LL) 1.7 - 7.7 K/uL   Lymphocytes Relative 64 %   Lymphs Abs 0.1 (L) 0.7 - 4.0 K/uL   Monocytes Relative 24 %   Monocytes Absolute 0.0 (L) 0.1 - 1.0 K/uL   Eosinophils Relative 0 %   Eosinophils Absolute 0.0 0.0 - 0.5 K/uL   Basophils Relative 0 %   Basophils Absolute 0.0 0.0 - 0.1 K/uL   Immature Granulocytes 0 %   Abs Immature Granulocytes 0.00 0.00 - 0.07 K/uL  Prepare platelet pheresis     Status: None (Preliminary result)   Collection Time: 07/20/22  9:01 AM  Result Value Ref Range   Unit Number [id number]    Blood Component Type PLTP2 PSORALEN  TREATED    Unit division 00  Status of Unit ISSUED    Transfusion Status      OK TO TRANSFUSE Performed at [facility] [address] [place], [place] [address]    Unit Number [id number]    Blood Component Type PLTP3 PSORALEN TREATED    Unit division 00    Status of Unit ALLOCATED    Transfusion Status OK TO TRANSFUSE   BLOOD TRANSFUSION REPORT - SCANNED     Status: None   Collection Time: 07/20/22  9:26 AM   Narrative   Ordered by an unspecified provider.    I have Reviewed nursing notes, Vitals, and Lab results since pt's last encounter. Pertinent lab results : see above I have ordered test including BMP, CBC, Mg I have reviewed the last note from staff over past 24 hours I have discussed pt's care plan and test results with nursing staff, case manager   LOS: 1 day   Dwyane Dee, MD [facility] 07/20/2022, 1:42 PM

## 2022-07-21 ENCOUNTER — Encounter (HOSPITAL_COMMUNITY): Payer: Self-pay | Admitting: Internal Medicine

## 2022-07-21 DIAGNOSIS — D709 Neutropenia, unspecified: Secondary | ICD-10-CM | POA: Diagnosis not present

## 2022-07-21 DIAGNOSIS — R5081 Fever presenting with conditions classified elsewhere: Secondary | ICD-10-CM | POA: Diagnosis not present

## 2022-07-21 DIAGNOSIS — N179 Acute kidney failure, unspecified: Secondary | ICD-10-CM | POA: Diagnosis not present

## 2022-07-21 DIAGNOSIS — C9200 Acute myeloblastic leukemia, not having achieved remission: Secondary | ICD-10-CM | POA: Diagnosis not present

## 2022-07-21 DIAGNOSIS — D649 Anemia, unspecified: Secondary | ICD-10-CM | POA: Diagnosis not present

## 2022-07-21 LAB — COMPREHENSIVE METABOLIC PANEL
ALT: 231 U/L — ABNORMAL HIGH (ref 0–44)
AST: 86 U/L — ABNORMAL HIGH (ref 15–41)
Albumin: 2.3 g/dL — ABNORMAL LOW (ref 3.5–5.0)
Alkaline Phosphatase: 129 U/L — ABNORMAL HIGH (ref 38–126)
Anion gap: 8 (ref 5–15)
BUN: 25 mg/dL — ABNORMAL HIGH (ref 8–23)
CO2: 20 mmol/L — ABNORMAL LOW (ref 22–32)
Calcium: 7.6 mg/dL — ABNORMAL LOW (ref 8.9–10.3)
Chloride: 105 mmol/L (ref 98–111)
Creatinine, Ser: 0.93 mg/dL (ref 0.61–1.24)
GFR, Estimated: >60 mL/min (ref >60)
Glucose, Bld: 124 mg/dL — ABNORMAL HIGH (ref 70–99)
Potassium: 3.5 mmol/L (ref 3.5–5.1)
Sodium: 133 mmol/L — ABNORMAL LOW (ref 135–145)
Total Bilirubin: 1.4 mg/dL — ABNORMAL HIGH (ref 0.3–1.2)
Total Protein: 5.3 g/dL — ABNORMAL LOW (ref 6.5–8.1)

## 2022-07-21 LAB — GASTROINTESTINAL PANEL BY PCR, STOOL (REPLACES STOOL CULTURE)

## 2022-07-21 LAB — CBC WITH DIFFERENTIAL/PLATELET
Abs Immature Granulocytes: 0 10*3/uL (ref 0.00–0.07)
Basophils Absolute: 0 10*3/uL (ref 0.0–0.1)
Basophils Relative: 0 %
Eosinophils Absolute: 0 10*3/uL (ref 0.0–0.5)
Eosinophils Relative: 0 %
HCT: 23.4 % — ABNORMAL LOW (ref 39.0–52.0)
Hemoglobin: 8.1 g/dL — ABNORMAL LOW (ref 13.0–17.0)
Immature Granulocytes: 0 %
Lymphocytes Relative: 75 %
Lymphs Abs: 0.1 10*3/uL — ABNORMAL LOW (ref 0.7–4.0)
MCH: 28.5 pg (ref 26.0–34.0)
MCHC: 34.6 g/dL (ref 30.0–36.0)
MCV: 82.4 fL (ref 80.0–100.0)
Monocytes Absolute: 0 10*3/uL — ABNORMAL LOW (ref 0.1–1.0)
Monocytes Relative: 17 %
Neutro Abs: 0 10*3/uL — CL (ref 1.7–7.7)
Neutrophils Relative %: 8 %
Platelets: 11 10*3/uL — CL (ref 150–400)
RBC: 2.84 MIL/uL — ABNORMAL LOW (ref 4.22–5.81)
RDW: 15.3 % (ref 11.5–15.5)
WBC: 0.1 10*3/uL — CL (ref 4.0–10.5)
nRBC: 0 % (ref 0.0–0.2)

## 2022-07-21 LAB — PREPARE PLATELET PHERESIS
Unit division: 0
Unit division: 0

## 2022-07-21 LAB — BPAM PLATELET PHERESIS
Blood Product Expiration Date: 202311292359
Blood Product Expiration Date: 202311292359
ISSUE DATE / TIME: 202311271341
ISSUE DATE / TIME: 202311271552
Unit Type and Rh: 5100
Unit Type and Rh: 5100

## 2022-07-21 LAB — MAGNESIUM: Magnesium: 2.2 mg/dL (ref 1.7–2.4)

## 2022-07-21 MED ORDER — INSULIN STARTER KIT- PEN NEEDLES (ENGLISH)
1.0000 | Freq: Once | Status: DC
  Filled 2022-07-21: qty 1

## 2022-07-21 NOTE — Consult Note (Signed)
Mr. Revonda Standard is well-known to me.  He is a very nice 71 year old white male.  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  He has been on treatment for this.  About 2 years ago or so, he began to transform.  We did a bone marrow biopsy on him which showed acute myeloid leukemia.  He has been treated at [facility].  He has been getting chemotherapy with decitabine (IV) and venetoclax (p.o.).  He has had a good response today.  It sounded and look like from his last bone marrow biopsy that he did not have any increase in blast.  He had his last treatment he said a couple weeks ago.  He has been on a transfusion protocol at [facility].  He has been getting red cells and platelets.  He had a good Thanksgiving.  However, afterwards, he began to feel poorly.  I think he was transfused at [facility] on the 24th.  He ended up on the floor at home.  He had the ambulance called.  He was brought to the ICU at [facility].  He was admitted on 07/19/2022.  So far, all cultures have been negative.  He tested negative for COVID and influenza.  When he was admitted, his white cell count was 200.  Hemoglobin 7.3.  Platelet count less than 5000.  He has been getting platelets and red blood cells.  Today, his white cell count is 100.  Hemoglobin 8.1.  Platelet count 11,000.  His electrolytes show sodium 133.  Potassium 3.5.  BUN 25 creatinine 0.93.  Calcium is 7.6 with an albumin of 2.3.  His SGPT is 231 SGOT 86.  Bilirubin is 1.4.  He has had problems with chronic diarrhea.  He had a CT scan done on the 26.  This was the abdomen.  This showed fluid throughout the colon extending down to the distal rectum.  There is no colonic wall thickening.  He did have some cholelithiasis.  With subtle [facility] cholecystic fluid.  There is some splenomegaly.  He has some change of portal venous hypertension.  He is on broad-spectrum antibiotic coverage.  He is on Zosyn.  He is also on  Noxafil and acyclovir.  He sounds pretty good today.  He denies any pain.  There is no bleeding.  He has had no rashes.  Currently, I would say his performance status is probably ECOG 2.  His vital signs show temperature of 97.8.  Pulse 88.  Blood pressure 120/60.  His head neck exam shows no ocular or oral lesions.  He has no mucositis.  There is no adenopathy in the neck.  Lung sound clear bilaterally.  He has no wheezing bilaterally.  He has good air movement bilaterally.  Cardiac exam is regular rate and rhythm.  He has no murmurs, rubs or bruits.  Abdominal exam is soft.  He does have splenomegaly.  The spleen tip is about 4 cm below the left costal margin.  There is no hepatomegaly.  There is no obvious fluid wave.  Extremity shows a PICC line in the left upper extremity.  The PICC line site looks intact.  Lower extremities shows some muscle atrophy bilaterally.  Neurological exam is nonfocal.  Skin exam does show some scattered ecchymoses.    Mr. Tavella is a 71 year old white male.  He has AML.  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  He has been on treatment for quite a  while.  It has been apparent that treatment has been working for him.  He has been taking decitabine and venetoclax.  He has profound pancytopenia.  Because of the history of AML, we really cannot give him colony-stimulating factor as this might stimulate his AML.  It is certainly possible that he may have pancytopenia for a while.  I know he has been getting quite a few transfusions.  It is also possible that he may be [place] immunized to platelets.  We will have to watch this carefully.  If he spikes a temperature, his antibiotics will have to be changed.  I will consider him for meropenem as this is very broad-spectrum coverage.  I probably would transfuse him if his hemoglobin gets below 7.  He is typically used to a hemoglobin around 8-9.  He had his last chemotherapy couple weeks ago so  I would like to hope that he will be recovering his blood counts soon.  I know that he will get incredible care from everybody in the ICU.  I suspect that he probably will be hospitalized for a week or so.  I spoke to one of his leukemia specialist at [facility].  He says that if there are any issues that we need to transfer him, that we could certainly do this.   Lattie Haw, MD  Numbers [phone]

## 2022-07-21 NOTE — Progress Notes (Signed)
Progress Note    KAEGAN HETTICH   QMG:[id number]  DOB: 07-12-1951  DOA: 07/19/2022     2 PCP: Burnard Bunting, MD  Initial CC: diarrhea, weakness  Hospital Course: Mr. Swander is a 71 year old male with PMH Jak-2 positive MPN with MF (diagnosed 02/20/20, managed with hydrea) then found to have blast crisis (15% blasts 07/11/20) so treated like AML. 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  He follows with [facility] oncology, last office visit reviewed from 06/25/2022. Other PMH includes hx DVT, hepatic thrombosis, GERD, PUD.   He presented to the hospital due to generalized weakness for approximately 3 to 4 days prior to hospitalization.  He also developed diarrhea prior to admission; he had reported that he was on the toilet and unable to stand up due to weakness thus prompting call for EMS. He met criteria for sepsis on admission and was placed on zosyn.  Stool studies were ordered due to diarrhea.  CXR obtained on admission and was unremarkable.  CT renal stone protocol showed fluid throughout colon extending to distal rectum compatible with diarrhea (no colonic wall thickening or pericolonic edema).  There was cholelithiasis with subtle pericholecystic fluid towards the fundus.  Splenomegaly was also appreciated with prominence of the portal and splenic veins. Right upper quadrant ultrasound was then obtained which again showed cholelithiasis with diffuse gallbladder wall thickening and pericholecystic fluid but no sonographic Murphy sign and findings were considered indeterminate for acute cholecystitis.  Lab workup revealed pancytopenia.  ANC 0 on admission. Hgb 7.3 g/dL, PLTC < 5.  He was started on blood and platelet transfusions.  Oncology was also consulted on admission.  Interval History:  No events overnight.  Denies any abdominal pain. Seems to be improved. Output into rectal tube seems to have decreased  since yesterday.   Assessment and Plan: * Febrile neutropenia ([place]) - Imaging workup mostly notable for equivocal cholecystitis as noted by cholelithiasis with diffuse gallbladder wall thickening and pericholecystic fluid but negative sonographic Murphy sign - Would be nonsurgical candidate and still probably a poor IR candidate due to severe pancytopenia -C. difficile negative.  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 -Oncology consulted on admission.  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  Uric acid on admission normal, 4.9 mg/dL - Continue trending CBC and will transfuse blood and platelets as indicated  Normocytic anemia - Hgb 7.3 g/dL on admission; patient transfused 2 units PRBC - Repeat hemoglobin, 8.6 g/dL this morning - Continue trending CBC  Thrombocytopenia ([name]) - PLTC < 5 on admission - continue trending PLTC and transfuse as necessary  - no evidence of bleeding at this time   AKI (acute kidney injury) ([name])-resolved as of 07/21/2022 - - baseline creatinine ~ 0.8 - patient presents with increase in creat >0.3 mg/dL above baseline, creat increase >1.5x baseline presumed to have occurred within past 7 days PTA -Suspected due to GI losses and hypovolemia - Creatinine 1.62 on admission - Patient receiving multiple blood products which will provide volume expansion - Creatinine improving - Continue trending BMP   Cirrhosis ([place]) - Cirrhosis by abdominal U/S performed 07/23/20.  Repeat U/S 09/13/20 revealed cirrhotic liver morphology with sequela portal hypertension; mural based thrombus noted in right hepatic vein and  moderate volume complex ascites noted.  Fibroscan 09/30/20 c/w advanced fibrosis/cirrhosis.  Liver biopsy deferred as risks outweigh the benefits. Repeat abdominal U/S 02/11/21 overall stable with improved ascites.  Saw GI 08/01/21 with no new recommendations. Abdominal U/S 08/13/21 c/w cirrhosis with small amount of ascites in upper abdomen.  Fibroscan performed 09/30/21.  Saw GI 01/30/22 with recommendations for low sodium diet, abdominal U/S (performed 02/17/22), AFP (done 02/02/22 and normal), fibroscan in 09/2022 and 26-monthfollow up (scheduled for 07/31/22).  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   -cannot safely anticoagulate due to significant thrombocytopenia   History of DVT (deep vein thrombosis) - History of LLE DVT at time of PV diagnosis (~ 2005) -See hepatic vein thrombosis  Prophylactic antibiotic - Patient treated with prophylactic posaconazole, acyclovir, Levaquin per outpatient oncology notes - Continue posaconazole and acyclovir - Hold Levaquin in setting of profuse diarrhea - Follow-up stool studies  Transaminitis - History of this previously but had improved.  Thought that it could be due to his chemotherapy agents. - Less elevated than previously; on admission AST 55, ALT 90, ALP 138. 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  Treated with therapeutic phlebotomy in the past   Old records reviewed in assessment of this patient  Antimicrobials: Zosyn 07/19/2022 >> current  DVT prophylaxis:  SCDs Start: 07/19/22 1805   Code Status:   Code Status: Full Code  Mobility Assessment (last 72 hours)     Mobility Assessment     Row Name 07/20/22 07:37:20           Does patient have an order for  bedrest or is patient medically unstable No - Continue assessment                Barriers to discharge:  Disposition Plan: Home Status is: Inpatient  Objective: Blood pressure 122/65, pulse 83, temperature (!) 100.8 F (38.2 C), temperature source Axillary, resp. rate (!) 24, SpO2 97 %.  Examination:  Physical Exam Constitutional:      Comments: Chronically ill-appearing elderly gentleman lying in bed in no distress.  Appears somewhat confused and dazed  HENT:     Head: Normocephalic and atraumatic.     Mouth/Throat:     Mouth: Mucous membranes are moist.  Eyes:     Extraocular Movements: Extraocular movements intact.  Cardiovascular:     Rate and Rhythm: Normal rate and regular rhythm.  Pulmonary:     Effort: Pulmonary effort is normal.     Breath sounds: Normal breath sounds.  Abdominal:     General: Bowel sounds are normal. There is no distension.     Palpations: Abdomen is soft.     Tenderness: There is no abdominal tenderness.  Musculoskeletal:        General: Normal range of motion.     Cervical back: Normal range of motion and neck supple.  Skin:    General: Skin is warm.  Neurological:     Comments: Some confusion appreciated.  Moves all 4 extremities and follows commands      Consultants:  Oncology  Procedures:    Data Reviewed:  Results for orders placed or performed during the hospital encounter of 07/19/22 (from the past 24 hour(s))  CBC with Differential/Platelet     Status: Abnormal   Collection Time: 07/20/22 10:07 PM  Result Value Ref Range   WBC 0.2 (LL) 4.0 - 10.5 K/uL   RBC 2.97 (L) 4.22 - 5.81 MIL/uL   Hemoglobin 8.4 (L) 13.0 - 17.0 g/dL   HCT 23.9 (L) 39.0 - 52.0 %   MCV 80.5 80.0 - 100.0 fL   MCH 28.3 26.0 - 34.0 pg   MCHC 35.1 30.0 - 36.0 g/dL   RDW 15.0 11.5 - 15.5 %   Platelets 9 (LL) 150 - 400 K/uL   nRBC 0.0 0.0 - 0.2 %   Neutrophils Relative % 7 %   Neutro Abs 0.0 (LL) 1.7 - 7.7 K/uL   Lymphocytes Relative 80 %   Lymphs  Abs 0.1 (L) 0.7 - 4.0 K/uL   Monocytes Relative 13 %   Monocytes Absolute 0.0 (L) 0.1 - 1.0 K/uL   Eosinophils Relative 0 %   Eosinophils Absolute 0.0 0.0 - 0.5 K/uL   Basophils Relative 0 %   Basophils Absolute 0.0 0.0 - 0.1 K/uL   Immature Granulocytes 0 %   Abs Immature Granulocytes 0.00 0.00 - 0.07 K/uL  MRSA Next Gen by PCR, Nasal     Status: None   Collection Time: 07/20/22 10:08 PM   Specimen: Nasal Mucosa; Nasal Swab  Result Value Ref Range   MRSA by PCR Next Gen NOT DETECTED NOT DETECTED  Prepare platelet pheresis     Status: None (Preliminary result)   Collection Time: 07/20/22 11:19 PM  Result Value Ref Range   Unit Number [id number]    Blood Component Type PLTP2 PSORALEN TREATED    Unit division 00    Status of Unit ISSUED    Transfusion Status      OK TO TRANSFUSE Performed at [facility] [address] [place], [place] [address]   CBC with Differential/Platelet     Status: Abnormal   Collection Time: 07/21/22  5:36 AM  Result Value Ref Range   WBC 0.1 (LL) 4.0 - 10.5 K/uL   RBC 2.84 (L) 4.22 - 5.81 MIL/uL   Hemoglobin 8.1 (L) 13.0 - 17.0 g/dL   HCT 23.4 (L) 39.0 - 52.0 %   MCV 82.4 80.0 - 100.0 fL   MCH 28.5 26.0 - 34.0 pg   MCHC 34.6 30.0 - 36.0 g/dL   RDW 15.3 11.5 - 15.5 %   Platelets 11 (LL) 150 - 400 K/uL   nRBC 0.0 0.0 - 0.2 %   Neutrophils Relative % 8 %   Neutro Abs 0.0 (LL) 1.7 - 7.7 K/uL   Lymphocytes Relative 75 %   Lymphs Abs 0.1 (L) 0.7 - 4.0 K/uL   Monocytes Relative 17 %   Monocytes Absolute 0.0 (L) 0.1 - 1.0 K/uL   Eosinophils Relative 0 %   Eosinophils Absolute 0.0 0.0 - 0.5 K/uL   Basophils Relative 0 %   Basophils Absolute 0.0 0.0 - 0.1 K/uL   Immature Granulocytes 0 %   Abs Immature Granulocytes 0.00 0.00 - 0.07 K/uL  Comprehensive metabolic panel     Status: Abnormal   Collection Time: 07/21/22  5:36 AM  Result Value Ref Range   Sodium 133 (L) 135 - 145 mmol/L   Potassium 3.5 3.5 - 5.1 mmol/L   Chloride  105 98 - 111 mmol/L   CO2 20 (L) 22 -  32 mmol/L   Glucose, Bld 124 (H) 70 - 99 mg/dL   BUN 25 (H) 8 - 23 mg/dL   Creatinine, Ser 0.93 0.61 - 1.24 mg/dL   Calcium 7.6 (L) 8.9 - 10.3 mg/dL   Total Protein 5.3 (L) 6.5 - 8.1 g/dL   Albumin 2.3 (L) 3.5 - 5.0 g/dL   AST 86 (H) 15 - 41 U/L   ALT 231 (H) 0 - 44 U/L   Alkaline Phosphatase 129 (H) 38 - 126 U/L   Total Bilirubin 1.4 (H) 0.3 - 1.2 mg/dL   GFR, Estimated >60 >60 mL/min   Anion gap 8 5 - 15  Magnesium     Status: None   Collection Time: 07/21/22  5:36 AM  Result Value Ref Range   Magnesium 2.2 1.7 - 2.4 mg/dL    I have Reviewed nursing notes, Vitals, and Lab results since pt's last encounter. Pertinent lab results : see above I have ordered test including BMP, CBC, Mg I have reviewed the last note from staff over past 24 hours I have discussed pt's care plan and test results with nursing staff, case manager  Time spent: Greater than 50% of the 55 minute visit was spent in counseling/coordination of care for the patient as laid out in the A&P.    LOS: 2 days   Dwyane Dee, MD [facility] 07/21/2022, 1:26 PM

## 2022-07-22 DIAGNOSIS — R5081 Fever presenting with conditions classified elsewhere: Secondary | ICD-10-CM | POA: Diagnosis not present

## 2022-07-22 DIAGNOSIS — N179 Acute kidney failure, unspecified: Secondary | ICD-10-CM | POA: Diagnosis not present

## 2022-07-22 DIAGNOSIS — D649 Anemia, unspecified: Secondary | ICD-10-CM | POA: Diagnosis not present

## 2022-07-22 DIAGNOSIS — D709 Neutropenia, unspecified: Secondary | ICD-10-CM | POA: Diagnosis not present

## 2022-07-22 LAB — CBC WITH DIFFERENTIAL/PLATELET
Abs Immature Granulocytes: 0 10*3/uL (ref 0.00–0.07)
Basophils Absolute: 0 10*3/uL (ref 0.0–0.1)
Basophils Relative: 0 %
Eosinophils Absolute: 0 10*3/uL (ref 0.0–0.5)
Eosinophils Relative: 0 %
HCT: 22.8 % — ABNORMAL LOW (ref 39.0–52.0)
Hemoglobin: 7.9 g/dL — ABNORMAL LOW (ref 13.0–17.0)
Immature Granulocytes: 0 %
Lymphocytes Relative: 82 %
Lymphs Abs: 0.1 10*3/uL — ABNORMAL LOW (ref 0.7–4.0)
MCH: 28.3 pg (ref 26.0–34.0)
MCHC: 34.6 g/dL (ref 30.0–36.0)
MCV: 81.7 fL (ref 80.0–100.0)
Monocytes Absolute: 0 10*3/uL — ABNORMAL LOW (ref 0.1–1.0)
Monocytes Relative: 12 %
Neutro Abs: 0 10*3/uL — CL (ref 1.7–7.7)
Neutrophils Relative %: 6 %
Platelets: 7 10*3/uL — CL (ref 150–400)
RBC: 2.79 MIL/uL — ABNORMAL LOW (ref 4.22–5.81)
RDW: 15.4 % (ref 11.5–15.5)
WBC: 0.2 10*3/uL — CL (ref 4.0–10.5)
nRBC: 0 % (ref 0.0–0.2)

## 2022-07-22 LAB — COMPREHENSIVE METABOLIC PANEL
ALT: 163 U/L — ABNORMAL HIGH (ref 0–44)
AST: 52 U/L — ABNORMAL HIGH (ref 15–41)
Albumin: 2.2 g/dL — ABNORMAL LOW (ref 3.5–5.0)
Alkaline Phosphatase: 157 U/L — ABNORMAL HIGH (ref 38–126)
Anion gap: 6 (ref 5–15)
BUN: 27 mg/dL — ABNORMAL HIGH (ref 8–23)
CO2: 23 mmol/L (ref 22–32)
Calcium: 7.4 mg/dL — ABNORMAL LOW (ref 8.9–10.3)
Chloride: 102 mmol/L (ref 98–111)
Creatinine, Ser: 1 mg/dL (ref 0.61–1.24)
GFR, Estimated: >60 mL/min (ref >60)
Glucose, Bld: 121 mg/dL — ABNORMAL HIGH (ref 70–99)
Potassium: 3.3 mmol/L — ABNORMAL LOW (ref 3.5–5.1)
Sodium: 131 mmol/L — ABNORMAL LOW (ref 135–145)
Total Bilirubin: 1.1 mg/dL (ref 0.3–1.2)
Total Protein: 5.2 g/dL — ABNORMAL LOW (ref 6.5–8.1)

## 2022-07-22 LAB — BPAM PLATELET PHERESIS
Blood Product Expiration Date: 202312012359
ISSUE DATE / TIME: 202311280059
Unit Type and Rh: 5100

## 2022-07-22 LAB — PREPARE PLATELET PHERESIS: Unit division: 0

## 2022-07-22 LAB — MAGNESIUM: Magnesium: 2 mg/dL (ref 1.7–2.4)

## 2022-07-22 MED ORDER — SODIUM CHLORIDE 0.9% FLUSH: 10.0000 mL | INTRAVENOUS | Status: DC | PRN

## 2022-07-22 MED ORDER — SODIUM CHLORIDE 0.9% FLUSH
10.0000 mL | Freq: Two times a day (BID) | INTRAVENOUS | Status: DC
  Administered 2022-07-27 – 2022-07-29 (×2): 10 mL

## 2022-07-22 MED ORDER — SODIUM CHLORIDE 0.9% IV SOLUTION: Freq: Once | INTRAVENOUS | Status: AC

## 2022-07-22 MED ORDER — ACETAMINOPHEN 325 MG PO TABS
650.0000 mg | ORAL_TABLET | Freq: Once | ORAL | Status: AC
  Administered 2022-07-22: 650 mg via ORAL
  Filled 2022-07-22: qty 2

## 2022-07-22 MED ORDER — DIPHENHYDRAMINE HCL 25 MG PO CAPS
25.0000 mg | ORAL_CAPSULE | Freq: Once | ORAL | Status: AC
  Administered 2022-07-22: 25 mg via ORAL
  Filled 2022-07-22: qty 1

## 2022-07-22 MED ORDER — POTASSIUM CHLORIDE CRYS ER 20 MEQ PO TBCR
40.0000 meq | EXTENDED_RELEASE_TABLET | ORAL | Status: AC
  Administered 2022-07-22 (×2): 40 meq via ORAL
  Filled 2022-07-22 (×2): qty 2

## 2022-07-22 MED ORDER — SODIUM BICARBONATE/SODIUM CHLORIDE MOUTHWASH
OROMUCOSAL | Status: DC
  Administered 2022-07-22 – 2022-07-26 (×5): 1 via OROMUCOSAL
  Filled 2022-07-22 (×2): qty 1000

## 2022-07-22 NOTE — Progress Notes (Signed)
Benjamin [name] is now on 4 E.  He was moved out of the ICU.  He was found to be positive for the Norovirus.  This certainly would explain the profuse diarrhea.  However, he says the diarrhea has gotten better.  I really would prefer to have the rectal tube taken out of him.  He has had no problems with bleeding.  He will need platelets today.  Platelet count today is 7000.  White cell count is 200.  Hemoglobin 7.9.  The SGPT is 163 SGOT 52.  These are getting better.  His BUN is 27 creatinine 1.0.  Sodium 131.  Potassium 3.3.  He is still quite weak.  He is on IV antibiotics.  His blood cultures have been negative.  I will continue him on the IV antibiotic until his white cell count improves.  I totally agree that he is not a candidate for any invasive procedure for this cholelithiasis.  He is not symptomatic with it.  He did have a low-grade temperature yesterday 100.8.  Will have to watch this.  If he has a temperature spike, he will need to have cultures done, and switch antibiotic probably to meropenem.  His vital signs show temperature of 98.2.  Pulse 78.  Blood pressure 117/61.  Oral exam does not show any mucositis.  His lungs are clear bilaterally.  He has good air movement bilaterally.  Cardiac exam regular rate and rhythm.  He has no murmurs.  Abdomen is soft.  Bowel sounds are present.  His spleen tip is just below the left costal margin.  There is no hepatomegaly.  Extremity shows some symmetric atrophy of muscles in upper and lower extremities.  Skin exam does show some scattered ecchymoses.  Neurological exam is nonfocal.  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  He has been getting treatment at [facility] with decitabine/venetoclax.  We will transfuse him platelets today.  Hopefully, he is not immunized the platelets.  Hopefully his white cell count is starting to come up a little bit.  He has norovirus.  This was all explained the diarrhea and him being so  weak.  We will have to get the rectal tube out.  I still suspect that he probably will be hospitalized for several more days.  It would not surprise me if he is still here through the weekend.  I know that he has gotten incredible care down in the ICU.  I know that the staff on 4 E. will also do a tremendous job.  Lattie Haw, MD  Jeneen Rinks 1:17

## 2022-07-22 NOTE — Progress Notes (Signed)
  Transition of Care [facility]) Screening Note   Patient Details  Name: Benjamin [name] Date of Birth: [birth year]   Transition of Care [facility]) CM/SW Contact:    Dessa Phi, RN Phone Number: 07/22/2022, 2:31 PM    Transition of Care Department [facility]) has reviewed patient and no TOC needs have been identified at this time. We will continue to monitor patient advancement through interdisciplinary progression rounds. If new patient transition needs arise, please place a TOC consult.

## 2022-07-22 NOTE — Care Management Important Message (Signed)
Important Message  Patient Details IM Letter given. Name: Benjamin [name] MRN: [id number] Date of Birth: 02-19-1951   Medicare Important Message Given:  Yes     Kerin Salen 07/22/2022, 12:27 PM

## 2022-07-22 NOTE — Progress Notes (Signed)
Progress Note   Patient: Benjamin [name] AOZ:[id number] DOB: 1951-07-20 DOA: 07/19/2022     3 DOS: the patient was seen and examined on 07/22/2022   Brief hospital course: Benjamin [name] is a 71 year old male with PMH Jak-2 positive MPN with MF (diagnosed 02/20/20, managed with hydrea) then found to have blast crisis (15% blasts 07/11/20) so treated like AML. 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  He follows with [facility] oncology, last office visit reviewed from 06/25/2022. Other PMH includes hx DVT, hepatic thrombosis, GERD, PUD.   He presented to the hospital due to generalized weakness for approximately 3 to 4 days prior to hospitalization.  He also developed diarrhea prior to admission; he had reported that he was on the toilet and unable to stand up due to weakness thus prompting call for EMS. He met criteria for sepsis on admission and was placed on zosyn.  Stool studies were ordered due to diarrhea.  CXR obtained on admission and was unremarkable.  CT renal stone protocol showed fluid throughout colon extending to distal rectum compatible with diarrhea (no colonic wall thickening or pericolonic edema).  There was cholelithiasis with subtle pericholecystic fluid towards the fundus.  Splenomegaly was also appreciated with prominence of the portal and splenic veins. Right upper quadrant ultrasound was then obtained which again showed cholelithiasis with diffuse gallbladder wall thickening and pericholecystic fluid but no sonographic Murphy sign and findings were considered indeterminate for acute cholecystitis.  Lab workup revealed pancytopenia.  ANC 0 on admission. Hgb 7.3 g/dL, PLTC < 5.  He was started on blood and platelet transfusions.  Oncology was also consulted on admission.  Assessment and Plan: * Febrile neutropenia (Benjamin [name]) - Imaging workup mostly notable for equivocal cholecystitis as noted by cholelithiasis  with diffuse gallbladder wall thickening and pericholecystic fluid but negative sonographic Murphy sign - Would be nonsurgical candidate and still probably a poor IR candidate due to severe pancytopenia -C. difficile negative.  GI panel detected norovirus. - has been on Zosyn; per Oncology, recs t to continue abx for now -Pt reports clinical improvement with decreased stool output   Acute myeloid leukemia (Benjamin [name]) - Follows closely with oncology at [place] office note reviewed from 06/25/2022 - Last marrow 01/26/22 negative by morphology and cytogenetics but JAK2 positive. -Oncology consulted on admission.  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  Uric acid on admission normal, 4.9 mg/dL - Continue trending CBC and will transfuse blood and platelets as indicated. Receiving plts this AM per Hematology   Normocytic anemia - Hgb 7.3 g/dL on admission; patient transfused 2 units PRBC - Hgb 7.9 this AM - Continue trending CBC   Thrombocytopenia ([name]) - PLTC < 5 on admission - Plts down to 7 this AM, receive plt transfusion per Hematololgy - no evidence of bleeding at this time    AKI (acute kidney injury) ([name])-resolved as of 07/21/2022 - - baseline creatinine ~ 0.8 -Suspected due to GI losses and hypovolemia - Creatinine 1.62 on admission, Cr normalized - f/u with bmet in AM     Cirrhosis ([place]) - Cirrhosis by abdominal U/S performed 07/23/20.  Repeat U/S 09/13/20 revealed cirrhotic liver morphology with sequela portal hypertension; mural based thrombus noted in right hepatic vein and moderate volume complex ascites noted.  Fibroscan 09/30/20 c/w advanced fibrosis/cirrhosis.  Liver biopsy deferred as risks outweigh the benefits.  Repeat abdominal U/S 02/11/21 overall stable with improved ascites.  Saw GI 08/01/21 with no new recommendations. Abdominal U/S 08/13/21 c/w cirrhosis with small amount of  ascites in upper abdomen.  Fibroscan performed 09/30/21.  Saw GI 01/30/22 with recommendations for low sodium diet, abdominal U/S (performed 02/17/22), AFP (done 02/02/22 and normal), fibroscan in 09/2022 and 87-monthfollow up (scheduled for 07/31/22).   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   -cannot safely anticoagulate due to significant thrombocytopenia     History of DVT (deep vein thrombosis) - History of LLE DVT at time of PV diagnosis (~ 2005) -See hepatic vein thrombosis   Prophylactic antibiotic - Patient treated with prophylactic posaconazole, acyclovir, Levaquin per outpatient oncology notes - Continue posaconazole and acyclovir - Held Levaquin in setting of profuse diarrhea - stool studies pos for norovirus above   Transaminitis - History of this previously but had improved.  Thought that it could be due to his chemotherapy agents. 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  Treated with therapeutic phlebotomy in the past      Subjective: Reports feeling better today  Physical Exam: Vitals:   07/22/22 0957 07/22/22 1029 07/22/22 1215 07/22/22 1358  BP: 110/60 (!) 111/57 (!) 101/53 129/65  Pulse: 89 80 71 78  Resp: 20 (!) 25 (!) 22 (!) 24  Temp: 98.8 F (37.1 C) 98.1 F (36.7 C) 98.1 F (36.7 C) 98 F (36.7 C)  TempSrc:  Oral Oral   SpO2: 98% 98% 98% 100%  Weight:      Height:       General exam: Awake, laying in bed, in nad Respiratory system: Normal respiratory effort, no wheezing Cardiovascular system: regular rate, s1, s2 Gastrointestinal system: Soft, nondistended, positive BS Central nervous system: CN2-12 grossly intact, strength intact Extremities: Perfused, no clubbing Skin:  Normal skin turgor, no notable skin lesions seen Psychiatry: Mood normal // no visual hallucinations   Data Reviewed:  Labs reviewed: Na 131, K 3.3, Cr 1.00, WBC 0.2, Hgb 7.9, Plts 7  Family Communication: Pt in room, family not at bedside  Disposition: Status is: Inpatient Remains inpatient appropriate because: Severity of illness  Planned Discharge Destination: Home     Author: SMarylu Lund MD 07/22/2022 4:14 PM  For on call review [email or web address]

## 2022-07-23 DIAGNOSIS — R5081 Fever presenting with conditions classified elsewhere: Secondary | ICD-10-CM | POA: Diagnosis not present

## 2022-07-23 DIAGNOSIS — C9201 Acute myeloblastic leukemia, in remission: Secondary | ICD-10-CM

## 2022-07-23 DIAGNOSIS — D649 Anemia, unspecified: Secondary | ICD-10-CM | POA: Diagnosis not present

## 2022-07-23 DIAGNOSIS — N179 Acute kidney failure, unspecified: Secondary | ICD-10-CM | POA: Diagnosis not present

## 2022-07-23 DIAGNOSIS — D709 Neutropenia, unspecified: Secondary | ICD-10-CM | POA: Diagnosis not present

## 2022-07-23 LAB — CBC WITH DIFFERENTIAL/PLATELET
Abs Immature Granulocytes: 0.01 10*3/uL (ref 0.00–0.07)
Basophils Absolute: 0 10*3/uL (ref 0.0–0.1)
Basophils Relative: 0 %
Eosinophils Absolute: 0 10*3/uL (ref 0.0–0.5)
Eosinophils Relative: 0 %
HCT: 23 % — ABNORMAL LOW (ref 39.0–52.0)
Hemoglobin: 7.8 g/dL — ABNORMAL LOW (ref 13.0–17.0)
Immature Granulocytes: 6 %
Lymphocytes Relative: 71 %
Lymphs Abs: 0.1 10*3/uL — ABNORMAL LOW (ref 0.7–4.0)
MCH: 28.1 pg (ref 26.0–34.0)
MCHC: 33.9 g/dL (ref 30.0–36.0)
MCV: 82.7 fL (ref 80.0–100.0)
Monocytes Absolute: 0 10*3/uL — ABNORMAL LOW (ref 0.1–1.0)
Monocytes Relative: 17 %
Neutro Abs: 0 10*3/uL — CL (ref 1.7–7.7)
Neutrophils Relative %: 6 %
Platelets: 7 10*3/uL — CL (ref 150–400)
RBC: 2.78 MIL/uL — ABNORMAL LOW (ref 4.22–5.81)
RDW: 15.6 % — ABNORMAL HIGH (ref 11.5–15.5)
WBC: 0.2 10*3/uL — CL (ref 4.0–10.5)
nRBC: 0 % (ref 0.0–0.2)

## 2022-07-23 LAB — COMPREHENSIVE METABOLIC PANEL
ALT: 109 U/L — ABNORMAL HIGH (ref 0–44)
AST: 31 U/L (ref 15–41)
Albumin: 2.2 g/dL — ABNORMAL LOW (ref 3.5–5.0)
Alkaline Phosphatase: 178 U/L — ABNORMAL HIGH (ref 38–126)
Anion gap: 8 (ref 5–15)
BUN: 28 mg/dL — ABNORMAL HIGH (ref 8–23)
CO2: 21 mmol/L — ABNORMAL LOW (ref 22–32)
Calcium: 7.6 mg/dL — ABNORMAL LOW (ref 8.9–10.3)
Chloride: 102 mmol/L (ref 98–111)
Creatinine, Ser: 0.94 mg/dL (ref 0.61–1.24)
GFR, Estimated: >60 mL/min (ref >60)
Glucose, Bld: 122 mg/dL — ABNORMAL HIGH (ref 70–99)
Potassium: 3.8 mmol/L (ref 3.5–5.1)
Sodium: 131 mmol/L — ABNORMAL LOW (ref 135–145)
Total Bilirubin: 1.1 mg/dL (ref 0.3–1.2)
Total Protein: 5.2 g/dL — ABNORMAL LOW (ref 6.5–8.1)

## 2022-07-23 LAB — PREPARE PLATELET PHERESIS: Unit division: 0

## 2022-07-23 LAB — MAGNESIUM: Magnesium: 2 mg/dL (ref 1.7–2.4)

## 2022-07-23 LAB — BPAM PLATELET PHERESIS
Blood Product Expiration Date: 202312012359
ISSUE DATE / TIME: 202311290944
Unit Type and Rh: 7300

## 2022-07-23 MED ORDER — AMINOCAPROIC ACID 500 MG PO TABS
1.0000 g | ORAL_TABLET | Freq: Three times a day (TID) | ORAL | Status: DC
  Administered 2022-07-23 – 2022-07-29 (×19): 1 g via ORAL
  Filled 2022-07-23 (×20): qty 2

## 2022-07-23 MED ORDER — AMINOCAPROIC ACID SOLUTION 5% (50 MG/ML)
10.0000 mL | ORAL | Status: DC
  Administered 2022-07-23 – 2022-07-29 (×37): 10 mL via ORAL
  Filled 2022-07-23 (×5): qty 100

## 2022-07-23 MED ORDER — ACETAMINOPHEN 325 MG PO TABS: 650.0000 mg | ORAL_TABLET | Freq: Four times a day (QID) | ORAL | Status: DC | PRN

## 2022-07-23 MED ORDER — ENSURE ENLIVE PO LIQD
237.0000 mL | Freq: Two times a day (BID) | ORAL | Status: DC
  Administered 2022-07-23 – 2022-07-26 (×3): 237 mL via ORAL

## 2022-07-23 MED ORDER — SODIUM CHLORIDE 0.9 % IV SOLN
1.0000 g | Freq: Three times a day (TID) | INTRAVENOUS | Status: DC
  Administered 2022-07-23 – 2022-07-28 (×15): 1 g via INTRAVENOUS
  Filled 2022-07-23 (×15): qty 20

## 2022-07-23 MED ORDER — ACETAMINOPHEN 325 MG PO TABS
650.0000 mg | ORAL_TABLET | Freq: Once | ORAL | Status: AC
  Administered 2022-07-23: 650 mg via ORAL
  Filled 2022-07-23: qty 2

## 2022-07-23 NOTE — Progress Notes (Signed)
Progress Note   Patient: Benjamin [name] UTM:[id number] DOB: [birth year] DOA: 07/19/2022     4 DOS: the patient was seen and examined on 07/23/2022   Brief hospital course: Mr. Brumley is a 71 year old male with PMH Jak-2 positive MPN with MF (diagnosed 02/20/20, managed with hydrea) then found to have blast crisis (15% blasts 07/11/20) so treated like AML. 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  He follows with [facility] oncology, last office visit reviewed from 06/25/2022. Other PMH includes hx DVT, hepatic thrombosis, GERD, PUD.   He presented to the hospital due to generalized weakness for approximately 3 to 4 days prior to hospitalization.  He also developed diarrhea prior to admission; he had reported that he was on the toilet and unable to stand up due to weakness thus prompting call for EMS. He met criteria for sepsis on admission and was placed on zosyn.  Stool studies were ordered due to diarrhea.  CXR obtained on admission and was unremarkable.  CT renal stone protocol showed fluid throughout colon extending to distal rectum compatible with diarrhea (no colonic wall thickening or pericolonic edema).  There was cholelithiasis with subtle pericholecystic fluid towards the fundus.  Splenomegaly was also appreciated with prominence of the portal and splenic veins. Right upper quadrant ultrasound was then obtained which again showed cholelithiasis with diffuse gallbladder wall thickening and pericholecystic fluid but no sonographic Murphy sign and findings were considered indeterminate for acute cholecystitis.  Lab workup revealed pancytopenia.  ANC 0 on admission. Hgb 7.3 g/dL, PLTC < 5.  He was started on blood and platelet transfusions.  Oncology was also consulted on admission.  Assessment and Plan: * Febrile neutropenia ([place]) - Imaging workup mostly notable for equivocal cholecystitis as noted by cholelithiasis  with diffuse gallbladder wall thickening and pericholecystic fluid but negative sonographic Murphy sign - Would be nonsurgical candidate and still probably a poor IR candidate due to severe pancytopenia -C. difficile negative.  GI panel detected norovirus. - has been on Zosyn; per Oncology, noted to be febrile overnight. 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 -Oncology consulted on admission.  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  Uric acid on admission normal, 4.9 mg/dL - Continue trending CBC and will transfuse blood and platelets as indicated. Received plts per Hematology on 11/29   Normocytic anemia - Hgb 7.3 g/dL on admission; patient transfused 2 units PRBC - Hgb 7.8 this AM - Continue trending CBC   Thrombocytopenia ([name]) - PLTC < 5 on admission - Plts down to 7 on 11/29, remains 7 this AM - no evidence of bleeding at this time  -Per Hematology, plan trial of Amicar to make plts more functional   AKI (acute kidney injury) ([name])-resolved as of 07/21/2022 - - baseline creatinine ~ 0.8 -Suspected due to GI losses and hypovolemia - Creatinine 1.62 on admission, Cr normalized - f/u with bmet in AM     Cirrhosis ([place]) - Cirrhosis by abdominal U/S performed 07/23/20.  Repeat U/S 09/13/20 revealed cirrhotic liver morphology with sequela portal hypertension; mural based thrombus noted in right hepatic vein and moderate volume complex ascites noted.  Fibroscan 09/30/20 c/w advanced fibrosis/cirrhosis.  Liver biopsy deferred as risks outweigh the benefits. Repeat abdominal U/S 02/11/21 overall stable with improved ascites.  Saw GI 08/01/21  with no new recommendations. Abdominal U/S 08/13/21 c/w cirrhosis with small amount of ascites in upper abdomen.  Fibroscan performed 09/30/21.  Saw GI 01/30/22 with recommendations for low sodium diet, abdominal U/S (performed 02/17/22), AFP (done 02/02/22 and normal), fibroscan in 09/2022 and 78-monthfollow up (scheduled for 07/31/22).   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   -cannot safely anticoagulate due to significant thrombocytopenia     History of DVT (deep vein thrombosis) - History of LLE DVT at time of PV diagnosis (~ 2005) -See hepatic vein thrombosis   Prophylactic antibiotic - Patient treated with prophylactic posaconazole, acyclovir, Levaquin per outpatient oncology notes - Continue posaconazole and acyclovir - Held Levaquin in setting of profuse diarrhea - stool studies pos for norovirus above   Transaminitis - History of this previously but had improved.  Thought that it could be due to his chemotherapy agents. 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  Treated with therapeutic phlebotomy in the past      Subjective: States feeling better today  Physical Exam: Vitals:   07/22/22 2000 07/23/22 0443 07/23/22 0609 07/23/22 1309  BP: 113/65 115/61  133/63  Pulse: 87 85  76  Resp: _0 Temp: 99 F (37.2 C) (!) 100.4 F (38 C) 99.5 F (37.5 C) 98.1 F (36.7 C)  TempSrc: Oral Oral Oral Oral  SpO2:  96%  100%  Weight:      Height:       General exam: Conversant, in no acute distress Respiratory system: normal chest rise, clear, no audible wheezing Cardiovascular system: regular rhythm, s1-s2 Gastrointestinal system: Nondistended, nontender, pos BS Central nervous system: No  seizures, no tremors Extremities: No cyanosis, no joint deformities Skin: No rashes, no pallor Psychiatry: Affect normal // no auditory hallucinations   Data Reviewed:  Labs reviewed: Na 131, K 3.8, Cr 0.94, WBC 0.2, Hgb 7.8, Plts 7  Family Communication: Pt in room, family not at bedside  Disposition: Status is: Inpatient Remains inpatient appropriate because: Severity of illness  Planned Discharge Destination: Home     Author: SMarylu Lund MD 07/23/2022 3:48 PM  For on call review [email or web address]

## 2022-07-23 NOTE — Progress Notes (Signed)
Initial Nutrition Assessment  DOCUMENTATION CODES:   Non-severe (moderate) malnutrition in context of chronic illness  INTERVENTION:   -Ensure Plus High Protein po BID, each supplement provides 350 kcal and 20 grams of protein.   NUTRITION DIAGNOSIS:   Moderate Malnutrition related to chronic illness, cancer and cancer related treatments as evidenced by moderate muscle depletion, mild fat depletion.  GOAL:   Patient will meet greater than or equal to 90% of their needs  MONITOR:   PO intake, Supplement acceptance, Labs, I & O's, Weight trends  REASON FOR ASSESSMENT:   Malnutrition Screening Tool    ASSESSMENT:   71 year old male with PMH Jak-2 positive MPN with MF (diagnosed 02/20/20, managed with hydrea) then found to have blast crisis (15% blasts 07/11/20) so treated like AML. 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 Admitted for generalized weakness and diarrhea.  Patient reports he was not eating well PTA for a few days. Was having diarrhea which today has ceased. Pt states he feels better, ate some eggs, toast, fruit and coffee this morning with no issues. Denies any issue with swallowing or chewing. States typically he eats 2-3 meals a day depending on if he has appointments. Pt eats yogurt, chicken, fish, shrimp and other proteins with meals. Pt is agreeable to trying Ensure for additional protein, likes vanilla.  Per patient, UBW ~160 lbs.  Current weight recorded as 161 lbs. Pt believes he weighs lower than this now.   Medications reviewed.  Labs reviewed: Low Na   NUTRITION - FOCUSED PHYSICAL EXAM:  Flowsheet Row Most Recent Value  Orbital Region Mild depletion  Upper Arm Region Severe depletion  Thoracic and Lumbar Region Unable to assess  Buccal Region Mild depletion  Temple Region Moderate depletion  Clavicle Bone Region Moderate depletion  Clavicle and Acromion Bone Region Moderate depletion   Scapular Bone Region Moderate depletion  Dorsal Hand Mild depletion  Patellar Region Moderate depletion  Anterior Thigh Region Moderate depletion  Posterior Calf Region Moderate depletion  Edema (RD Assessment) Mild  [BLEs]  Hair Reviewed  [thinning]  Eyes Reviewed  Mouth Reviewed  Skin Reviewed  Nails Reviewed       Diet Order:   Diet Order             Diet regular Room service appropriate? Yes; Fluid consistency: Thin  Diet effective now                   EDUCATION NEEDS:   No education needs have been identified at this time  Skin:  Skin Assessment: Reviewed RN Assessment  Last BM:  11/29 -type 7  Height:   Ht Readings from Last 1 Encounters:  07/21/22 '6\' 1"'$  (1.854 m)    Weight:   Wt Readings from Last 1 Encounters:  07/21/22 73 kg    BMI:  Body mass index is 21.24 kg/m.  Estimated Nutritional Needs:   Kcal:  2100-2300  Protein:  85-100g  Fluid:  2.1L/day   Clayton Bibles, MS, RD, LDN Inpatient Clinical Dietitian Contact information available via Amion

## 2022-07-23 NOTE — Plan of Care (Signed)
  Problem: Education: Goal: Knowledge of General Education information will improve Description: Including pain rating scale, medication(s)/side effects and non-pharmacologic comfort measures Outcome: Progressing   Problem: Clinical Measurements: Goal: Respiratory complications will improve Outcome: Progressing Goal: Cardiovascular complication will be avoided Outcome: Progressing   Problem: Activity: Goal: Risk for activity intolerance will decrease Outcome: Progressing   Problem: Nutrition: Goal: Adequate nutrition will be maintained Outcome: Progressing   Problem: Coping: Goal: Level of anxiety will decrease Outcome: Progressing   Problem: Elimination: Goal: Will not experience complications related to bowel motility Outcome: Progressing Goal: Will not experience complications related to urinary retention Outcome: Progressing   Problem: Pain Managment: Goal: General experience of comfort will improve Outcome: Progressing   Problem: Safety: Goal: Ability to remain free from injury will improve Outcome: Progressing   Problem: Skin Integrity: Goal: Risk for impaired skin integrity will decrease Outcome: Progressing   

## 2022-07-23 NOTE — Progress Notes (Signed)
Pharmacy Antibiotic Note  Benjamin [name] is a 71 y.o. male admitted on 07/19/2022 with  febrile neutropenia .  PMH significant for AML. Pt was started on piperacillin/tazobactam on admission. Blood cultures remain negative, GI panel + for norovirus. Oncology broadening antibiotics to meropenem for continued fevers on piperacillin/tazobactam. Repeat BCx ordered.  Pharmacy has been consulted for meropenem dosing.  Today, 07/23/22 Pt remains pancytopenic. WBC 0.2, ANC 0 SCr WNL Tmax 100.4  Plan: Meropenem 1 g IV q8h Monitor renal function, culture data  Height: '6\' 1"'$  (185.4 cm) Weight: 73 kg (161 lb) (stated) IBW/kg (Calculated) : 79.9  Temp (24hrs), Avg:98.8 F (37.1 C), Min:98 F (36.7 C), Max:100.4 F (38 C)  Recent Labs  Lab 07/19/22 0905 07/20/22 0737 07/20/22 2207 07/21/22 0536 07/22/22 0500 07/23/22 0306  WBC 0.2* 0.2* 0.2* 0.1* 0.2* 0.2*  CREATININE 1.62* 1.23  --  0.93 1.00 0.94  LATICACIDVEN 1.2  --   --   --   --   --     Estimated Creatinine Clearance: 72.3 mL/min (by C-G formula based on SCr of 0.94 mg/dL).    Allergies  Allergen Reactions   Aleve [Naproxen Sodium] Other (See Comments)    Pt thinks it might be all NSAIDs but is unsure. Caused him to have emergency stomach surgery.   Biaxin [Clarithromycin] Other (See Comments)    Joint pains   Cephalexin Hives and Rash   Flagyl [Metronidazole] Other (See Comments)    Joint pain   Morphine And Related Hives    Antimicrobials this admission: Piperacillin/tazobactam 11/26 >> 11/30 meropenem 11/30 >>   Dose adjustments this admission:  Microbiology results: 11/26 BCx: ngtd 11/30 BCx:  11/26 GI panel: Norovirus  [name], PharmD 07/23/2022 7:33 AM

## 2022-07-23 NOTE — Progress Notes (Addendum)
Thankfully, the diarrhea stopped.  This certainly would be consistent with a Norovirus.  Also, the rectal tube has been removed.  I think the real problem that we may have is at he did not respond to platelet transfusions.  His platelet count this morning is 7000.  Hemoglobin 7.8.  White cell count is 200.  His BUN is 28 creatinine 0.94.  Calcium is 7.6 with an albumin of 2.2.  His LFTs are little bit better.  Still has had a low-grade temperature.  Again is profoundly neutropenic.  I will start him on some Amicar for the low platelets.  Since there is no bleeding, I would like to try to hold off on transfusing him right now.  Given that he has had this temperature spike, I probably would change his antibiotics.  Given that he has a PICC line, I think we probably need to add vancomycin to his antibiotic protocol.  He is having no abdominal pain.  He is having no obvious cough or shortness of breath.  He has had a lot of bruising.  There is been no bleeding.  He has had no mouth sores.  His appetite might be a little bit better.  His vital signs show temperature 99.5.  Pulse 85.  Blood pressure 115/61.  His oral exam does not show any mucositis.  There is no petechial hemorrhages in the oral cavity.  Neck is supple with no adenopathy.  Lungs are clear bilaterally.  He has good air movement bilaterally.  Cardiac exam regular rate and rhythm without a murmur.  Abdomen is soft.  Bowel sounds are present.  There is no guarding or rebound tenderness.  He has no palpable hepatomegaly.  Spleen tip is palpable just below the left costal margin.  Extremity shows symmetric muscle atrophy in upper and lower extremities.  Neurological exam is nonfocal.  Skin exam does show scattered ecchymoses.  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  He is on treatment for this.  He gets decitabine/venetoclax.  I think his last cycle was a couple weeks ago.  He now has pancytopenia.  I would like to think this is from his treatment and  not from the AML cell progressing.  He still has these low-grade temperatures.  Again I think would be worthwhile making a change in antibiotics.  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  I will hold off on platelets right now since he is not bleeding.  I will start him on some Amicar to see if this may help make the platelets more functional.  Hopefully, we will start to see a rise in his counts.  I know this is an incredibly complicated.  I know that the staff on [address] are doing a tremendous job with him.   Lattie Haw, MD  Colossians 3:23

## 2022-07-24 DIAGNOSIS — N179 Acute kidney failure, unspecified: Secondary | ICD-10-CM | POA: Diagnosis not present

## 2022-07-24 DIAGNOSIS — E440 Moderate protein-calorie malnutrition: Secondary | ICD-10-CM | POA: Insufficient documentation

## 2022-07-24 DIAGNOSIS — D649 Anemia, unspecified: Secondary | ICD-10-CM | POA: Diagnosis not present

## 2022-07-24 DIAGNOSIS — C9201 Acute myeloblastic leukemia, in remission: Secondary | ICD-10-CM | POA: Diagnosis not present

## 2022-07-24 DIAGNOSIS — D709 Neutropenia, unspecified: Secondary | ICD-10-CM | POA: Diagnosis not present

## 2022-07-24 DIAGNOSIS — R5081 Fever presenting with conditions classified elsewhere: Secondary | ICD-10-CM | POA: Diagnosis not present

## 2022-07-24 LAB — COMPREHENSIVE METABOLIC PANEL
ALT: 77 U/L — ABNORMAL HIGH (ref 0–44)
AST: 25 U/L (ref 15–41)
Albumin: 2 g/dL — ABNORMAL LOW (ref 3.5–5.0)
Alkaline Phosphatase: 181 U/L — ABNORMAL HIGH (ref 38–126)
Anion gap: 7 (ref 5–15)
BUN: 27 mg/dL — ABNORMAL HIGH (ref 8–23)
CO2: 21 mmol/L — ABNORMAL LOW (ref 22–32)
Calcium: 7.5 mg/dL — ABNORMAL LOW (ref 8.9–10.3)
Chloride: 101 mmol/L (ref 98–111)
Creatinine, Ser: 0.99 mg/dL (ref 0.61–1.24)
GFR, Estimated: >60 mL/min (ref >60)
Glucose, Bld: 130 mg/dL — ABNORMAL HIGH (ref 70–99)
Potassium: 3.9 mmol/L (ref 3.5–5.1)
Sodium: 129 mmol/L — ABNORMAL LOW (ref 135–145)
Total Bilirubin: 1.2 mg/dL (ref 0.3–1.2)
Total Protein: 5.1 g/dL — ABNORMAL LOW (ref 6.5–8.1)

## 2022-07-24 LAB — CBC WITH DIFFERENTIAL/PLATELET
Abs Immature Granulocytes: 0.01 10*3/uL (ref 0.00–0.07)
Basophils Absolute: 0 10*3/uL (ref 0.0–0.1)
Basophils Relative: 0 %
Eosinophils Absolute: 0 10*3/uL (ref 0.0–0.5)
Eosinophils Relative: 0 %
HCT: 22.5 % — ABNORMAL LOW (ref 39.0–52.0)
Hemoglobin: 7.5 g/dL — ABNORMAL LOW (ref 13.0–17.0)
Immature Granulocytes: 4 %
Lymphocytes Relative: 68 %
Lymphs Abs: 0.2 10*3/uL — ABNORMAL LOW (ref 0.7–4.0)
MCH: 27.8 pg (ref 26.0–34.0)
MCHC: 33.3 g/dL (ref 30.0–36.0)
MCV: 83.3 fL (ref 80.0–100.0)
Monocytes Absolute: 0.1 10*3/uL (ref 0.1–1.0)
Monocytes Relative: 24 %
Neutro Abs: 0 10*3/uL — CL (ref 1.7–7.7)
Neutrophils Relative %: 4 %
Platelets: 5 10*3/uL — CL (ref 150–400)
RBC: 2.7 MIL/uL — ABNORMAL LOW (ref 4.22–5.81)
RDW: 15.8 % — ABNORMAL HIGH (ref 11.5–15.5)
WBC: 0.3 10*3/uL — CL (ref 4.0–10.5)
nRBC: 0 % (ref 0.0–0.2)

## 2022-07-24 LAB — CULTURE, BLOOD (ROUTINE X 2)
Culture: NO GROWTH
Culture: NO GROWTH
Special Requests: ADEQUATE
Special Requests: ADEQUATE

## 2022-07-24 LAB — MAGNESIUM: Magnesium: 1.8 mg/dL (ref 1.7–2.4)

## 2022-07-24 MED ORDER — ZINC OXIDE 40 % EX OINT
TOPICAL_OINTMENT | Freq: Every day | CUTANEOUS | Status: DC
  Administered 2022-07-29: 1 via TOPICAL
  Filled 2022-07-24 (×2): qty 57

## 2022-07-24 NOTE — Progress Notes (Signed)
Mobility Specialist - Progress Note   07/24/22 1403  Mobility  Activity Ambulated with assistance in hallway  Level of Assistance Standby assist, set-up cues, supervision of patient - no hands on  Assistive Device Front wheel walker  Distance Ambulated (ft) 460 ft  Activity Response Tolerated well  Mobility Referral Yes  $Mobility charge 1 Mobility   Pt received in bed and agreeable to mobility. No complaints during session. Pt to bed after session with all needs met & call bell in reach.    Maya Johnson Mobility Specialist  

## 2022-07-24 NOTE — Plan of Care (Signed)
  Problem: Activity: Goal: Risk for activity intolerance will decrease Outcome: Progressing   Problem: Coping: Goal: Level of anxiety will decrease Outcome: Progressing   Problem: Elimination: Goal: Will not experience complications related to bowel motility Outcome: Progressing Goal: Will not experience complications related to urinary retention Outcome: Progressing   Problem: Pain Managment: Goal: General experience of comfort will improve Outcome: Progressing   

## 2022-07-24 NOTE — Progress Notes (Signed)
Progress Note   Patient: Benjamin [name] NUU:[id number] DOB: Feb 23, 1951 DOA: 07/19/2022     5 DOS: the patient was seen and examined on 07/24/2022   Brief hospital course: [name] is a 71 year old male with PMH Jak-2 positive MPN with MF (diagnosed 02/20/20, managed with hydrea) then found to have blast crisis (15% blasts 07/11/20) so treated like AML. 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  He follows with [facility] oncology, last office visit reviewed from 06/25/2022. Other PMH includes hx DVT, hepatic thrombosis, GERD, PUD.   He presented to the hospital due to generalized weakness for approximately 3 to 4 days prior to hospitalization.  He also developed diarrhea prior to admission; he had reported that he was on the toilet and unable to stand up due to weakness thus prompting call for EMS. He met criteria for sepsis on admission and was placed on zosyn.  Stool studies were ordered due to diarrhea.  CXR obtained on admission and was unremarkable.  CT renal stone protocol showed fluid throughout colon extending to distal rectum compatible with diarrhea (no colonic wall thickening or pericolonic edema).  There was cholelithiasis with subtle pericholecystic fluid towards the fundus.  Splenomegaly was also appreciated with prominence of the portal and splenic veins. Right upper quadrant ultrasound was then obtained which again showed cholelithiasis with diffuse gallbladder wall thickening and pericholecystic fluid but no sonographic Murphy sign and findings were considered indeterminate for acute cholecystitis.  Lab workup revealed pancytopenia.  ANC 0 on admission. Hgb 7.3 g/dL, PLTC < 5.  He was started on blood and platelet transfusions.  Oncology was also consulted on admission.  Assessment and Plan: * Febrile neutropenia ([place]) - Imaging workup mostly notable for equivocal cholecystitis as noted by cholelithiasis  with diffuse gallbladder wall thickening and pericholecystic fluid but negative sonographic Murphy sign - Would be nonsurgical candidate and still probably a poor IR candidate due to severe pancytopenia -C. difficile negative.  GI panel detected norovirus. - has been on Zosyn; per Oncology, noted to be febrile recently. Pt  meropenem per Oncology -Pt reports clinical improvement with decreased stool output. 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 -Oncology consulted on admission.  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  Uric acid on admission normal, 4.9 mg/dL - Continue trending CBC and will transfuse blood and platelets as indicated. Received plts per Hematology on 11/29   Normocytic anemia - Hgb 7.3 g/dL on admission; patient transfused 2 units PRBC - Hgb 7.5 this AM - Continue trending CBC   Thrombocytopenia ([name]) - PLTC < 5 on admission - Plts down to [place] today - no evidence of bleeding at this time  -Per Hematology, plan trial of Amicar to make plts more functional   AKI (acute kidney injury) ([name])-resolved as of 07/21/2022 - - baseline creatinine ~ 0.8 -Suspected due to GI losses and hypovolemia - Creatinine 1.62 on admission, Cr normalized - f/u with bmet in AM     Cirrhosis ([place]) - Cirrhosis by abdominal U/S performed 07/23/20.  Repeat U/S 09/13/20 revealed cirrhotic liver morphology with sequela portal hypertension; mural based thrombus noted in right hepatic vein and moderate volume complex ascites noted.  Fibroscan 09/30/20 c/w advanced fibrosis/cirrhosis.  Liver biopsy deferred as risks outweigh the benefits. Repeat abdominal U/S 02/11/21 overall stable with improved ascites.  Saw GI  08/01/21 with no new recommendations. Abdominal U/S 08/13/21 c/w cirrhosis with small amount of ascites in upper abdomen.  Fibroscan performed 09/30/21.  Saw GI 01/30/22 with recommendations for low sodium diet, abdominal U/S (performed 02/17/22), AFP (done 02/02/22 and normal), fibroscan in 09/2022 and 79-monthfollow up (scheduled for 07/31/22).   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   -cannot safely anticoagulate due to significant thrombocytopenia     History of DVT (deep vein thrombosis) - History of LLE DVT at time of PV diagnosis (~ 2005) -See hepatic vein thrombosis   Prophylactic antibiotic - Patient treated with prophylactic posaconazole, acyclovir, Levaquin per outpatient oncology notes - Continue posaconazole and acyclovir - Held Levaquin in setting of recent diarrhea - stool studies pos for norovirus above   Transaminitis - History of this previously but had improved.  Thought that it could be due to his chemotherapy agents. - Holding chemotherapy medications - Continue to trend     GERD (gastroesophageal reflux disease) - no PPI noted on med rec; hold regardless for now   Hypokalemia - now corrected -recheck bmet in AM   P. vera [place] Diagn[place] 2005.  Treated with therapeutic phlebotomy in the past      Subjective: states feeling better  Physical Exam: Vitals:   07/23/22 1309 07/23/22 2116 07/24/22 0525 07/24/22 1214  BP: 133/63 (!) 122/58 (!) 120/49 (!) 125/57  Pulse: 76 83 87 86  Resp: _0 Temp: 98.1 F (36.7 C) 98.5 F (36.9 C) 100.3 F (37.9 C) 98 F (36.7 C)  TempSrc: Oral Oral Oral Oral  SpO2: 100% 100% 96% 100%  Weight:      Height:       General exam: Conversant, in no acute distress Respiratory system: normal chest rise, clear, no audible wheezing Cardiovascular system: regular rhythm, s1-s2 Gastrointestinal system: Nondistended, nontender, pos  BS Central nervous system: No seizures, no tremors Extremities: No cyanosis, no joint deformities Skin: No rashes, no pallor Psychiatry: Affect normal // no auditory hallucinations   Data Reviewed:  Labs reviewed: Na 129, K 3.9, Cr 0.99, WBC 0.3, Hgb 7.5, Plts 5  Family Communication: Pt in room, family not at bedside  Disposition: Status is: Inpatient Remains inpatient appropriate because: Severity of illness  Planned Discharge Destination: Home     Author: SMarylu Lund MD 07/24/2022 4:11 PM  For on call review [email or web address]

## 2022-07-24 NOTE — Progress Notes (Signed)
Benjamin [name] looks little bit better this morning.  His white cell count is 300.  His platelet count went out of 5000.  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  He is on Amicar.  He is not having any bleeding.  There may be a little bit of bruising.  His appetite might be a little bit better.  He has had no nausea or vomiting.  He now is on Merrem.  He might also be on vancomycin because of the PICC line which is in the left arm.  So far, cultures that were taken yesterday are negative.  His labs show a BUN of 27 creatinine 0.99.  His calcium is 7.5.  His albumin is 2.0.  This might be a problem.  Hopefully, we can get his nutritional state up better.  He is getting some physical therapy.  I think this is incredibly helpful for him.  He has not had any more diarrhea.  He had the Norovirus.  I am sure this probably initiated the hospitalization with him being so weak and dehydrated.  His vital signs show temperature of 98.  Pulse 86.  Blood pressure 125/57.  His lungs sound clear bilaterally.  He has good air movement bilaterally.  Cardiac exam regular rate and rhythm.  He has no murmurs.  Abdomen is soft.  He has good bowel sounds.  There is no guarding or rebound tenderness.  Oral exam does not show any mucositis or petechia.  Extremities shows some muscle atrophy in upper lower extremities.  He has some scattered ecchymoses.  I do not see anything that is new.  Neurological exam is nonfocal.  I think the problem that were going to have is his platelet count.  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  I want to try to hold off on transfusing as much as I can.  It is hard to say when he will be able to go home.  I think the white cell count has come up a little bit better.  Thankfully, cultures are all negative.  I do appreciate the incredible care he is getting from the staff on 4 E.  I know this is quite complicated.   Lattie Haw,  MD  Psalm 41:3

## 2022-07-25 DIAGNOSIS — N179 Acute kidney failure, unspecified: Secondary | ICD-10-CM | POA: Diagnosis not present

## 2022-07-25 DIAGNOSIS — D709 Neutropenia, unspecified: Secondary | ICD-10-CM | POA: Diagnosis not present

## 2022-07-25 DIAGNOSIS — C9200 Acute myeloblastic leukemia, not having achieved remission: Secondary | ICD-10-CM | POA: Diagnosis not present

## 2022-07-25 DIAGNOSIS — C9201 Acute myeloblastic leukemia, in remission: Secondary | ICD-10-CM | POA: Diagnosis not present

## 2022-07-25 DIAGNOSIS — R5081 Fever presenting with conditions classified elsewhere: Secondary | ICD-10-CM | POA: Diagnosis not present

## 2022-07-25 LAB — CBC WITH DIFFERENTIAL/PLATELET
Abs Immature Granulocytes: 0.01 10*3/uL (ref 0.00–0.07)
Basophils Absolute: 0 10*3/uL (ref 0.0–0.1)
Basophils Relative: 0 %
Eosinophils Absolute: 0 10*3/uL (ref 0.0–0.5)
Eosinophils Relative: 0 %
HCT: 21.8 % — ABNORMAL LOW (ref 39.0–52.0)
Hemoglobin: 7.2 g/dL — ABNORMAL LOW (ref 13.0–17.0)
Immature Granulocytes: 4 %
Lymphocytes Relative: 64 %
Lymphs Abs: 0.2 10*3/uL — ABNORMAL LOW (ref 0.7–4.0)
MCH: 27.6 pg (ref 26.0–34.0)
MCHC: 33 g/dL (ref 30.0–36.0)
MCV: 83.5 fL (ref 80.0–100.0)
Monocytes Absolute: 0.1 10*3/uL (ref 0.1–1.0)
Monocytes Relative: 24 %
Neutro Abs: 0 10*3/uL — CL (ref 1.7–7.7)
Neutrophils Relative %: 8 %
Platelets: <5 10*3/uL — CL (ref 150–400)
RBC: 2.61 MIL/uL — ABNORMAL LOW (ref 4.22–5.81)
RDW: 15.5 % (ref 11.5–15.5)
WBC: 0.3 10*3/uL — CL (ref 4.0–10.5)
nRBC: 0 % (ref 0.0–0.2)

## 2022-07-25 LAB — URINALYSIS, ROUTINE W REFLEX MICROSCOPIC
Bilirubin Urine: NEGATIVE
Glucose, UA: NEGATIVE mg/dL
Ketones, ur: NEGATIVE mg/dL
Leukocytes,Ua: NEGATIVE
Nitrite: NEGATIVE
Protein, ur: 100 mg/dL — AB
Specific Gravity, Urine: 1.025 (ref 1.005–1.030)
pH: 5.5 (ref 5.0–8.0)

## 2022-07-25 LAB — COMPREHENSIVE METABOLIC PANEL
ALT: 55 U/L — ABNORMAL HIGH (ref 0–44)
AST: 25 U/L (ref 15–41)
Albumin: 2.2 g/dL — ABNORMAL LOW (ref 3.5–5.0)
Alkaline Phosphatase: 168 U/L — ABNORMAL HIGH (ref 38–126)
Anion gap: 6 (ref 5–15)
BUN: 20 mg/dL (ref 8–23)
CO2: 22 mmol/L (ref 22–32)
Calcium: 7.3 mg/dL — ABNORMAL LOW (ref 8.9–10.3)
Chloride: 102 mmol/L (ref 98–111)
Creatinine, Ser: 0.98 mg/dL (ref 0.61–1.24)
GFR, Estimated: >60 mL/min (ref >60)
Glucose, Bld: 129 mg/dL — ABNORMAL HIGH (ref 70–99)
Potassium: 3.8 mmol/L (ref 3.5–5.1)
Sodium: 130 mmol/L — ABNORMAL LOW (ref 135–145)
Total Bilirubin: 1.1 mg/dL (ref 0.3–1.2)
Total Protein: 5.1 g/dL — ABNORMAL LOW (ref 6.5–8.1)

## 2022-07-25 LAB — URINALYSIS, MICROSCOPIC (REFLEX)

## 2022-07-25 LAB — MAGNESIUM: Magnesium: 2 mg/dL (ref 1.7–2.4)

## 2022-07-25 MED ORDER — SODIUM CHLORIDE 0.9% FLUSH: 10.0000 mL | INTRAVENOUS | Status: DC | PRN

## 2022-07-25 MED ORDER — HEPARIN SOD (PORK) LOCK FLUSH 100 UNIT/ML IV SOLN
250.0000 [IU] | INTRAVENOUS | Status: AC | PRN
  Administered 2022-07-29: 250 [IU]

## 2022-07-25 MED ORDER — ACETAMINOPHEN 325 MG PO TABS
325.0000 mg | ORAL_TABLET | Freq: Four times a day (QID) | ORAL | Status: DC | PRN
  Administered 2022-07-25: 325 mg via ORAL
  Filled 2022-07-25: qty 1

## 2022-07-25 MED ORDER — SODIUM CHLORIDE 0.9% IV SOLUTION: Freq: Once | INTRAVENOUS | Status: AC

## 2022-07-25 MED ORDER — SODIUM CHLORIDE 0.9% FLUSH: 3.0000 mL | INTRAVENOUS | Status: DC | PRN

## 2022-07-25 MED ORDER — SODIUM CHLORIDE 0.9% IV SOLUTION: 250.0000 mL | Freq: Once | INTRAVENOUS | Status: AC

## 2022-07-25 MED ORDER — HEPARIN SOD (PORK) LOCK FLUSH 100 UNIT/ML IV SOLN: 500.0000 [IU] | Freq: Every day | INTRAVENOUS | Status: DC | PRN

## 2022-07-25 NOTE — Plan of Care (Signed)
  Problem: Education: Goal: Knowledge of General Education information will improve Description: Including pain rating scale, medication(s)/side effects and non-pharmacologic comfort measures Outcome: Progressing   Problem: Health Behavior/Discharge Planning: Goal: Ability to manage health-related needs will improve Outcome: Progressing   Problem: Clinical Measurements: Goal: Ability to maintain clinical measurements within normal limits will improve Outcome: Progressing Goal: Will remain free from infection Outcome: Progressing Contact and enteric precautions maintained. No signs of worsening infection.  Goal: Diagnostic test results will improve Outcome: Progressing Goal: Respiratory complications will improve Outcome: Progressing No signs of respiratory distress. O2 sat within acceptable limits.  Goal: Cardiovascular complication will be avoided Outcome: Progressing   Problem: Activity: Goal: Risk for activity intolerance will decrease Outcome: Progressing   Problem: Nutrition: Goal: Adequate nutrition will be maintained Outcome: Progressing   Problem: Coping: Goal: Level of anxiety will decrease Outcome: Progressing Patient denies anxiety, appears comfortable. Resting well.    Problem: Elimination: Goal: Will not experience complications related to bowel motility Outcome: Progressing Patient reports the frequency of loose stools has decreased.  Goal: Will not experience complications related to urinary retention Outcome: Progressing   Problem: Pain Managment: Goal: General experience of comfort will improve Outcome: Progressing Patient denies pain, appears comfortable.    Problem: Safety: Goal: Ability to remain free from injury will improve Outcome: Progressing Patient a moderate fall risk. Fall protocol maintained. No falls or injury this shift.    Problem: Skin Integrity: Goal: Risk for impaired skin integrity will decrease Outcome: Progressing Assisted  to reposition Q2H and PRN. Incontinence care provided with each incontinent episode. Bony prominences protected.

## 2022-07-25 NOTE — Progress Notes (Signed)
Progress Note   Patient: Benjamin [name] AUQ:[id number] DOB: 03/13/1951 DOA: 07/19/2022     6 DOS: the patient was seen and examined on 07/25/2022   Brief hospital course: Benjamin [name] is a 72 year old male with PMH Jak-2 positive MPN with MF (diagnosed 02/20/20, managed with hydrea) then found to have blast crisis (15% blasts 07/11/20) so treated like AML. 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  He follows with [facility] oncology, last office visit reviewed from 06/25/2022. Other PMH includes hx DVT, hepatic thrombosis, GERD, PUD.   He presented to the hospital due to generalized weakness for approximately 3 to 4 days prior to hospitalization.  He also developed diarrhea prior to admission; he had reported that he was on the toilet and unable to stand up due to weakness thus prompting call for EMS. He met criteria for sepsis on admission and was placed on zosyn.  Stool studies were ordered due to diarrhea.  CXR obtained on admission and was unremarkable.  CT renal stone protocol showed fluid throughout colon extending to distal rectum compatible with diarrhea (no colonic wall thickening or pericolonic edema).  There was cholelithiasis with subtle pericholecystic fluid towards the fundus.  Splenomegaly was also appreciated with prominence of the portal and splenic veins. Right upper quadrant ultrasound was then obtained which again showed cholelithiasis with diffuse gallbladder wall thickening and pericholecystic fluid but no sonographic Murphy sign and findings were considered indeterminate for acute cholecystitis.  Lab workup revealed pancytopenia.  ANC 0 on admission. Hgb 7.3 g/dL, PLTC < 5.  He was started on blood and platelet transfusions.  Oncology was also consulted on admission.  Assessment and Plan: * Febrile neutropenia (Benjamin [name]) - Imaging workup mostly notable for equivocal cholecystitis as noted by cholelithiasis  with diffuse gallbladder wall thickening and pericholecystic fluid but negative sonographic Murphy sign - Would be nonsurgical candidate and still probably a poor IR candidate due to severe pancytopenia -Benjamin [name] negative.  GI panel detected norovirus. - has been on Zosyn; per Oncology, noted to be febrile recently. Pt  meropenem per Oncology -Pt reports clinical improvement with decreased stool output. Had transient loose stool earlier this AM   Acute myeloid leukemia (Benjamin [name]) - Follows closely with oncology at [name] office note reviewed from 06/25/2022 - Last marrow 01/26/22 negative by morphology and cytogenetics but JAK2 positive. -Oncology consulted on admission.  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  Uric acid on admission normal, 4.9 mg/dL - Plts down to <5, receiving plt transfusion per Hematology   Normocytic anemia - Hgb 7.3 g/dL on admission; patient transfused 2 units PRBC - Hgb 7.2 this AM - Recheck cbc in AM   Thrombocytopenia ([name]) - PLTC < 5 on admission - Plts down to <5k today - no evidence of bleeding at this time  -Per Hematology, given trial of Amicar to make plts more functional   AKI (acute kidney injury) ([name])-resolved as of 07/21/2022 - - baseline creatinine ~ 0.8 -Suspected due to GI losses and hypovolemia - Creatinine 1.62 on admission, Cr normalized - f/u with bmet in AM     Cirrhosis ([place]) - Cirrhosis by abdominal U/S performed 07/23/20.  Repeat U/S 09/13/20 revealed cirrhotic liver morphology with sequela portal hypertension; mural based thrombus noted in right hepatic vein and moderate volume complex ascites noted.  Fibroscan 09/30/20 c/w advanced fibrosis/cirrhosis.  Liver biopsy deferred as risks outweigh the benefits. Repeat abdominal U/S 02/11/21 overall stable with improved ascites.  Saw GI 08/01/21 with no new recommendations. Abdominal U/S  08/13/21 c/w cirrhosis with small amount of ascites in upper abdomen.  Fibroscan performed 09/30/21.  Saw GI 01/30/22 with recommendations for low sodium diet, abdominal U/S (performed 02/17/22), AFP (done 02/02/22 and normal), fibroscan in 09/2022 and 57-monthfollow up (scheduled for 07/31/22).   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   -cannot safely anticoagulate due to significant thrombocytopenia     History of DVT (deep vein thrombosis) - History of LLE DVT at time of PV diagnosis (~ 2005) -See hepatic vein thrombosis   Prophylactic antibiotic - Patient treated with prophylactic posaconazole, acyclovir, Levaquin per outpatient oncology notes - Continue posaconazole and acyclovir - Held Levaquin in setting of recent diarrhea - stool studies pos for norovirus above   Transaminitis - History of this previously but had improved.  Thought that it could be due to his chemotherapy agents. 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  Treated with therapeutic phlebotomy in the past      Subjective: Without complaints  Physical Exam: Vitals:   07/25/22 1059 07/25/22 1133 07/25/22 1321 07/25/22 1517  BP: 114/62 123/61 (!) 135/58   Pulse: 92 88    Resp: 20 20    Temp: 98.3 F (36.8 C) 98.7 F (37.1 C) (!) 100.7 F (38.2 C) 97.7 F (36.5 C)  TempSrc: Oral  Oral Oral  SpO2: 98%  100%   Weight:      Height:       General exam: Awake, laying in bed, in nad Respiratory system: Normal respiratory effort, no wheezing Cardiovascular system: regular rate, s1, s2 Gastrointestinal system: Soft, nondistended, positive BS Central nervous system: CN2-12 grossly intact, strength intact Extremities: Perfused, no  clubbing Skin: Normal skin turgor, no notable skin lesions seen Psychiatry: Mood normal // no visual hallucinations   Data Reviewed:  Labs reviewed: Na 130, K 3.8, Cr 0.98, WBC 0.3, Hgb 7.2, Plts <5  Family Communication: Pt in room, family not at bedside  Disposition: Status is: Inpatient Remains inpatient appropriate because: Severity of illness  Planned Discharge Destination: Home     Author: SMarylu Lund MD 07/25/2022 5:47 PM  For on call review [email or web address]

## 2022-07-25 NOTE — Progress Notes (Signed)
Overall, Benjamin [name] is about the same.  He did have an episode of diarrhea this morning.  This might be from his antibiotics.  His platelet count is less than 5000.  Will have to transfuse him today.  His white cell count is stable at 300.  Hemoglobin is 7.2.  I will not give red blood cells.  He has had no fever.  This morning, temperature is 99.9.  He is on broad-spectrum antibiotic coverage with Merrem.  He is also on posaconazole and acyclovir.  We do have him on Amicar to help with any bleeding.  His appetite is marginal.  He has had no problems with mouth sores.  He has had no dysphagia or odynophagia.  He is out of bed a little bit.  His vital signs show temperature of 99.9.  Pulse 84.  Blood pressure 117/53.  His oral exam does not show any mucositis or petechia.  Lungs are clear bilaterally.  He has good air movement bilaterally.  Cardiac exam regular rate and rhythm.  He has no murmurs.  Abdomen is soft.  Bowel sounds are present.  There is no hepatomegaly.  Spleen some IV at the right costal margin.  Extremities shows the PICC line in the left upper arm.  He has some symmetric muscle atrophy in lower legs.  Neurological exam is nonfocal.  We still are in the throes of pancytopenia secondary to his chemotherapy.  At least, I had to believe that this pancytopenia is from the chemotherapy.  Will we will transfuse platelets today.  Hopefully, he will have a "bump" in the platelet count.  I will continue him on the antibiotics given his profound neutropenia.  I do appreciate the incredible care that he is getting from all the staff upon 4 E.  This is incredibly complicated.  Lattie Haw, MD  Jeneen Rinks 1:5

## 2022-07-26 DIAGNOSIS — N179 Acute kidney failure, unspecified: Secondary | ICD-10-CM | POA: Diagnosis not present

## 2022-07-26 DIAGNOSIS — C9201 Acute myeloblastic leukemia, in remission: Secondary | ICD-10-CM | POA: Diagnosis not present

## 2022-07-26 DIAGNOSIS — C9200 Acute myeloblastic leukemia, not having achieved remission: Secondary | ICD-10-CM | POA: Diagnosis not present

## 2022-07-26 DIAGNOSIS — R5081 Fever presenting with conditions classified elsewhere: Secondary | ICD-10-CM | POA: Diagnosis not present

## 2022-07-26 DIAGNOSIS — D709 Neutropenia, unspecified: Secondary | ICD-10-CM | POA: Diagnosis not present

## 2022-07-26 LAB — COMPREHENSIVE METABOLIC PANEL
ALT: 46 U/L — ABNORMAL HIGH (ref 0–44)
AST: 23 U/L (ref 15–41)
Albumin: 2.2 g/dL — ABNORMAL LOW (ref 3.5–5.0)
Alkaline Phosphatase: 149 U/L — ABNORMAL HIGH (ref 38–126)
Anion gap: 7 (ref 5–15)
BUN: 27 mg/dL — ABNORMAL HIGH (ref 8–23)
CO2: 22 mmol/L (ref 22–32)
Calcium: 7.3 mg/dL — ABNORMAL LOW (ref 8.9–10.3)
Chloride: 101 mmol/L (ref 98–111)
Creatinine, Ser: 0.95 mg/dL (ref 0.61–1.24)
GFR, Estimated: >60 mL/min (ref >60)
Glucose, Bld: 132 mg/dL — ABNORMAL HIGH (ref 70–99)
Potassium: 3.8 mmol/L (ref 3.5–5.1)
Sodium: 130 mmol/L — ABNORMAL LOW (ref 135–145)
Total Bilirubin: 1 mg/dL (ref 0.3–1.2)
Total Protein: 5.6 g/dL — ABNORMAL LOW (ref 6.5–8.1)

## 2022-07-26 LAB — CBC
HCT: 24.4 % — ABNORMAL LOW (ref 39.0–52.0)
Hemoglobin: 8.3 g/dL — ABNORMAL LOW (ref 13.0–17.0)
MCH: 28.3 pg (ref 26.0–34.0)
MCHC: 34 g/dL (ref 30.0–36.0)
MCV: 83.3 fL (ref 80.0–100.0)
Platelets: 8 10*3/uL — CL (ref 150–400)
RBC: 2.93 MIL/uL — ABNORMAL LOW (ref 4.22–5.81)
RDW: 15.3 % (ref 11.5–15.5)
WBC: 0.3 10*3/uL — CL (ref 4.0–10.5)
nRBC: 0 % (ref 0.0–0.2)

## 2022-07-26 LAB — CBC WITH DIFFERENTIAL/PLATELET
Abs Immature Granulocytes: 0.01 10*3/uL (ref 0.00–0.07)
Basophils Absolute: 0 10*3/uL (ref 0.0–0.1)
Basophils Relative: 0 %
Eosinophils Absolute: 0 10*3/uL (ref 0.0–0.5)
Eosinophils Relative: 0 %
HCT: 21.2 % — ABNORMAL LOW (ref 39.0–52.0)
Hemoglobin: 6.9 g/dL — CL (ref 13.0–17.0)
Immature Granulocytes: 5 %
Lymphocytes Relative: 66 %
Lymphs Abs: 0.1 10*3/uL — ABNORMAL LOW (ref 0.7–4.0)
MCH: 27.6 pg (ref 26.0–34.0)
MCHC: 32.5 g/dL (ref 30.0–36.0)
MCV: 84.8 fL (ref 80.0–100.0)
Monocytes Absolute: 0 10*3/uL — ABNORMAL LOW (ref 0.1–1.0)
Monocytes Relative: 19 %
Neutro Abs: 0 10*3/uL — CL (ref 1.7–7.7)
Neutrophils Relative %: 10 %
Platelets: 5 10*3/uL — CL (ref 150–400)
RBC: 2.5 MIL/uL — ABNORMAL LOW (ref 4.22–5.81)
RDW: 15.4 % (ref 11.5–15.5)
WBC: 0.2 10*3/uL — CL (ref 4.0–10.5)
nRBC: 0 % (ref 0.0–0.2)

## 2022-07-26 LAB — PREPARE RBC (CROSSMATCH)

## 2022-07-26 LAB — URINE CULTURE: Culture: NO GROWTH

## 2022-07-26 LAB — LACTATE DEHYDROGENASE: LDH: 206 U/L — ABNORMAL HIGH (ref 98–192)

## 2022-07-26 MED ORDER — SODIUM CHLORIDE 0.9% IV SOLUTION: Freq: Once | INTRAVENOUS | Status: AC

## 2022-07-26 MED ORDER — FUROSEMIDE 10 MG/ML IJ SOLN
20.0000 mg | Freq: Once | INTRAMUSCULAR | Status: AC
  Administered 2022-07-26: 20 mg via INTRAVENOUS
  Filled 2022-07-26: qty 2

## 2022-07-26 NOTE — Progress Notes (Signed)
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  We can have to try for HLA or single donor platelets.  His platelet count is 5000.  He is not bleeding.  He is on Amicar.  His hemoglobin 6.9.  I will give him 1 unit of packed red blood cells.  His white cell count is 200.  This is stable.  He says he feels okay.  He walked a little bit yesterday.  He has had no abdominal pain.  He has had no obvious bleeding.  There is been no diarrhea.  He may have had a low-grade temperature yesterday of 100.7.  This may have been when he was getting his platelets.  He has had no cough.  There is no shortness of breath.  He has had no nausea or vomiting.  His appetite seems to be doing okay.  His vital signs show temperature of 98.8.  Pulse 95.  Blood pressure 124/75.  Oral exam does not show any mucositis.  There is no palatal petechia.  Lungs are clear bilaterally.  He has good air movement bilaterally.  Cardiac exam regular rate and rhythm.  He has no murmurs.  Abdomen is soft.  Bowel sounds are present.  There is no palpable hepatomegaly.  Spleen tip might be palpable at the left costal margin.  Extremity shows some symmetric muscle atrophy in lower extremities.  Skin exam does show some scattered ecchymoses.  Neurological exam is nonfocal.  Again, I think our real issue going forward is going to be his thrombocytopenia.  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  We will see if we can find any HLA matched or single donor platelets to try to help with his thrombocytopenia.  I suppose it is possible that we just may not be able to get his white cell count up.  He will continue on the antibiotics.  Thankfully, cultures are still negative.  This is incredibly complicated.  I do appreciate the wonderful help from all the staff upon 4 E.  Pete Ennever, MD  Philippians 4:13 

## 2022-07-26 NOTE — Progress Notes (Signed)
Progress Note   Patient: Benjamin [name] NGE:[id number] DOB: 1951/03/20 DOA: 07/19/2022     7 DOS: the patient was seen and examined on 07/26/2022   Brief hospital course: Benjamin [name] is a 71 year old male with PMH Jak-2 positive MPN with MF (diagnosed 02/20/20, managed with hydrea) then found to have blast crisis (15% blasts 07/11/20) so treated like AML. 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  He follows with [facility] oncology, last office visit reviewed from 06/25/2022. Other PMH includes hx DVT, hepatic thrombosis, GERD, PUD.   He presented to the hospital due to generalized weakness for approximately 3 to 4 days prior to hospitalization.  He also developed diarrhea prior to admission; he had reported that he was on the toilet and unable to stand up due to weakness thus prompting call for EMS. He met criteria for sepsis on admission and was placed on zosyn.  Stool studies were ordered due to diarrhea.  CXR obtained on admission and was unremarkable.  CT renal stone protocol showed fluid throughout colon extending to distal rectum compatible with diarrhea (no colonic wall thickening or pericolonic edema).  There was cholelithiasis with subtle pericholecystic fluid towards the fundus.  Splenomegaly was also appreciated with prominence of the portal and splenic veins. Right upper quadrant ultrasound was then obtained which again showed cholelithiasis with diffuse gallbladder wall thickening and pericholecystic fluid but no sonographic Murphy sign and findings were considered indeterminate for acute cholecystitis.  Lab workup revealed pancytopenia.  ANC 0 on admission. Hgb 7.3 g/dL, PLTC < 5.  He was started on blood and platelet transfusions.  Oncology was also consulted on admission.  Assessment and Plan: * Febrile neutropenia ([name]) - Imaging workup mostly notable for equivocal cholecystitis as noted by cholelithiasis  with diffuse gallbladder wall thickening and pericholecystic fluid but negative sonographic Murphy sign - Would be nonsurgical candidate and still probably a poor IR candidate due to severe pancytopenia -C. difficile negative.  GI panel detected norovirus. -Now on empiric meropenem per Oncology -Pt reports clinical improvement with decreased stool output.    Acute myeloid leukemia ([name]) - Follows closely with oncology at [name] office note reviewed from 06/25/2022 - Last marrow 01/26/22 negative by morphology and cytogenetics but JAK2 positive. -Oncology consulted on admission.  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, can try for HLA or single donor platelets   Normocytic anemia - Hgb 7.3 g/dL on admission; patient transfused 2 units PRBC - Hgb 6.9 this AM, getting PRBC per Hematology - Recheck cbc in AM   Thrombocytopenia ([name]) - PLTC < 5 on admission - Plts down to <5k today - no evidence of bleeding at this time  -Per Hematology, given trial of Amicar to make plts more functional   AKI (acute kidney injury) ([name])-resolved as of 07/21/2022 - - baseline creatinine ~ 0.8 -Suspected due to GI losses and hypovolemia - Creatinine 1.62 on admission, Cr normalized - f/u with bmet in AM     Cirrhosis ([place]) - Cirrhosis by abdominal U/S performed 07/23/20.  Repeat U/S 09/13/20 revealed cirrhotic liver morphology with sequela portal hypertension; mural based thrombus noted in right hepatic vein and moderate volume complex ascites noted.  Fibroscan 09/30/20 c/w advanced fibrosis/cirrhosis.  Liver biopsy deferred  as risks outweigh the benefits. Repeat abdominal U/S 02/11/21 overall stable with improved ascites.  Saw GI 08/01/21 with no new recommendations. Abdominal U/S  08/13/21 c/w cirrhosis with small amount of ascites in upper abdomen.  Fibroscan performed 09/30/21.  Saw GI 01/30/22 with recommendations for low sodium diet, abdominal U/S (performed 02/17/22), AFP (done 02/02/22 and normal), fibroscan in 09/2022 and 56-monthfollow up (scheduled for 07/31/22).   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   -cannot safely anticoagulate due to significant thrombocytopenia     History of DVT (deep vein thrombosis) - History of LLE DVT at time of PV diagnosis (~ 2005) -See hepatic vein thrombosis   Prophylactic antibiotic - Patient treated with prophylactic posaconazole, acyclovir, Levaquin per outpatient oncology notes - Continue posaconazole and acyclovir - Held Levaquin in setting of recent diarrhea - stool studies pos for norovirus above   Transaminitis - History of this previously but had improved.  Thought that it could be due to his chemotherapy agents. 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  Treated with therapeutic phlebotomy in the past      Subjective: No complaints this AM  Physical Exam: Vitals:   07/26/22 1145 07/26/22 1215 07/26/22 1230 07/26/22 1523  BP:  132/65 (!) 151/63 (!) 130/58  Pulse:  93  85  Resp:  20 (!) 22 20  Temp: 98.2 F (36.8 C) 97.6 F (36.4 C) 98.6 F (37 C) 98.5 F (36.9 C)  TempSrc:  Oral Oral Oral  SpO2:  97% 97% 97%  Weight:      Height:       General exam: Conversant, in no acute distress Respiratory system: normal chest rise, clear, no audible wheezing Cardiovascular system: regular rhythm, s1-s2 Gastrointestinal system: Nondistended, nontender, pos BS Central nervous system: No seizures, no tremors Extremities: No  cyanosis, no joint deformities, BLE edema Skin: No rashes, no pallor Psychiatry: Affect normal // no auditory hallucinations    Data Reviewed:  Labs reviewed: Na 130, K 3.8, Cr 0.95, WBC 0.2, Hgb 6.9, Plts 5  Family Communication: Pt in room, family not at bedside  Disposition: Status is: Inpatient Remains inpatient appropriate because: Severity of illness  Planned Discharge Destination: Home     Author: SMarylu Lund MD 07/26/2022 4:17 PM  For on call review [email or web address]

## 2022-07-27 DIAGNOSIS — C9200 Acute myeloblastic leukemia, not having achieved remission: Secondary | ICD-10-CM | POA: Diagnosis not present

## 2022-07-27 DIAGNOSIS — N179 Acute kidney failure, unspecified: Secondary | ICD-10-CM | POA: Diagnosis not present

## 2022-07-27 DIAGNOSIS — R5081 Fever presenting with conditions classified elsewhere: Secondary | ICD-10-CM | POA: Diagnosis not present

## 2022-07-27 DIAGNOSIS — D709 Neutropenia, unspecified: Secondary | ICD-10-CM | POA: Diagnosis not present

## 2022-07-27 DIAGNOSIS — C9201 Acute myeloblastic leukemia, in remission: Secondary | ICD-10-CM | POA: Diagnosis not present

## 2022-07-27 LAB — COMPREHENSIVE METABOLIC PANEL
ALT: 39 U/L (ref 0–44)
AST: 26 U/L (ref 15–41)
Albumin: 2 g/dL — ABNORMAL LOW (ref 3.5–5.0)
Alkaline Phosphatase: 126 U/L (ref 38–126)
Anion gap: 6 (ref 5–15)
BUN: 31 mg/dL — ABNORMAL HIGH (ref 8–23)
CO2: 23 mmol/L (ref 22–32)
Calcium: 7.1 mg/dL — ABNORMAL LOW (ref 8.9–10.3)
Chloride: 97 mmol/L — ABNORMAL LOW (ref 98–111)
Creatinine, Ser: 1.05 mg/dL (ref 0.61–1.24)
GFR, Estimated: >60 mL/min (ref >60)
Glucose, Bld: 126 mg/dL — ABNORMAL HIGH (ref 70–99)
Potassium: 3.4 mmol/L — ABNORMAL LOW (ref 3.5–5.1)
Sodium: 126 mmol/L — ABNORMAL LOW (ref 135–145)
Total Bilirubin: 1 mg/dL (ref 0.3–1.2)
Total Protein: 5.2 g/dL — ABNORMAL LOW (ref 6.5–8.1)

## 2022-07-27 LAB — CBC WITH DIFFERENTIAL/PLATELET
Abs Immature Granulocytes: 0 10*3/uL (ref 0.00–0.07)
Basophils Absolute: 0 10*3/uL (ref 0.0–0.1)
Basophils Relative: 0 %
Eosinophils Absolute: 0 10*3/uL (ref 0.0–0.5)
Eosinophils Relative: 0 %
HCT: 20.5 % — ABNORMAL LOW (ref 39.0–52.0)
Hemoglobin: 6.9 g/dL — CL (ref 13.0–17.0)
Immature Granulocytes: 0 %
Lymphocytes Relative: 70 %
Lymphs Abs: 0.1 10*3/uL — ABNORMAL LOW (ref 0.7–4.0)
MCH: 28 pg (ref 26.0–34.0)
MCHC: 33.7 g/dL (ref 30.0–36.0)
MCV: 83.3 fL (ref 80.0–100.0)
Monocytes Absolute: 0 10*3/uL — ABNORMAL LOW (ref 0.1–1.0)
Monocytes Relative: 20 %
Neutro Abs: 0 10*3/uL — CL (ref 1.7–7.7)
Neutrophils Relative %: 10 %
Platelets: 7 10*3/uL — CL (ref 150–400)
RBC: 2.46 MIL/uL — ABNORMAL LOW (ref 4.22–5.81)
RDW: 15.3 % (ref 11.5–15.5)
WBC: 0.2 10*3/uL — CL (ref 4.0–10.5)
nRBC: 0 % (ref 0.0–0.2)

## 2022-07-27 LAB — BPAM PLATELET PHERESIS
Blood Product Expiration Date: 202312042359
Blood Product Expiration Date: 202312042359
ISSUE DATE / TIME: 202312021107
ISSUE DATE / TIME: 202312031203
Unit Type and Rh: 5100
Unit Type and Rh: 5100

## 2022-07-27 LAB — PREPARE PLATELET PHERESIS
Unit division: 0
Unit division: 0

## 2022-07-27 MED ORDER — FUROSEMIDE 10 MG/ML IJ SOLN
40.0000 mg | Freq: Two times a day (BID) | INTRAMUSCULAR | Status: DC
  Administered 2022-07-27 (×2): 40 mg via INTRAVENOUS
  Filled 2022-07-27 (×3): qty 4

## 2022-07-27 MED ORDER — ALBUMIN HUMAN 25 % IV SOLN
25.0000 g | Freq: Once | INTRAVENOUS | Status: AC
  Administered 2022-07-27: 25 g via INTRAVENOUS
  Filled 2022-07-27: qty 100

## 2022-07-27 MED ORDER — EPOETIN ALFA 40000 UNIT/ML IJ SOLN
40000.0000 [IU] | INTRAMUSCULAR | Status: DC
  Administered 2022-07-27: 40000 [IU] via SUBCUTANEOUS
  Filled 2022-07-27 (×2): qty 1

## 2022-07-27 MED ORDER — POTASSIUM CHLORIDE CRYS ER 20 MEQ PO TBCR
60.0000 meq | EXTENDED_RELEASE_TABLET | Freq: Once | ORAL | Status: AC
  Administered 2022-07-27: 60 meq via ORAL
  Filled 2022-07-27: qty 3

## 2022-07-27 NOTE — Progress Notes (Signed)
Progress Note   Patient: Benjamin [name] DOB: [birth year] DOA: 07/19/2022     8 DOS: the patient was seen and examined on 07/27/2022   Brief hospital course: Benjamin [name] is a 71 year old male with PMH Jak-2 positive MPN with MF (diagnosed 02/20/20, managed with hydrea) then found to have blast crisis (15% blasts 07/11/20) so treated like AML. 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  He follows with [facility] oncology, last office visit reviewed from 06/25/2022. Other PMH includes hx DVT, hepatic thrombosis, GERD, PUD.   He presented to the hospital due to generalized weakness for approximately 3 to 4 days prior to hospitalization.  He also developed diarrhea prior to admission; he had reported that he was on the toilet and unable to stand up due to weakness thus prompting call for EMS. He met criteria for sepsis on admission and was placed on zosyn.  Stool studies were ordered due to diarrhea.  CXR obtained on admission and was unremarkable.  CT renal stone protocol showed fluid throughout colon extending to distal rectum compatible with diarrhea (no colonic wall thickening or pericolonic edema).  There was cholelithiasis with subtle pericholecystic fluid towards the fundus.  Splenomegaly was also appreciated with prominence of the portal and splenic veins. Right upper quadrant ultrasound was then obtained which again showed cholelithiasis with diffuse gallbladder wall thickening and pericholecystic fluid but no sonographic Murphy sign and findings were considered indeterminate for acute cholecystitis.  Lab workup revealed pancytopenia.  ANC 0 on admission. Hgb 7.3 g/dL, PLTC < 5.  He was started on blood and platelet transfusions.  Oncology was also consulted on admission.  Assessment and Plan: * Febrile neutropenia ([place]) - Imaging workup mostly notable for equivocal cholecystitis as noted by cholelithiasis  with diffuse gallbladder wall thickening and pericholecystic fluid but negative sonographic Murphy sign - Would be nonsurgical candidate and still probably a poor IR candidate due to severe pancytopenia -C. difficile negative.  GI panel detected norovirus. 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  - Plts down to 6.9 despite recent PRBC transfusion -Dr. Marin Olp [facility]    Normocytic anemia - Hgb 7.3 g/dL on admission - Hgb 6.9 this AM -Total this visit, required total 3 units PRBC transfusion -trial of Procrit per Hematology - Recheck cbc in AM   Thrombocytopenia ([name]) - PLTC < 5 on admission - Plts down to 7k today - no evidence of bleeding at this time  -Per Hematology, given trial of Amicar to make plts more functional -Has required total 7 units platelets this visit thus far   AKI (acute kidney injury) ([name])-resolved as of 07/21/2022 - - baseline creatinine ~ 0.8 -Suspected due to GI losses and hypovolemia - Creatinine 1.62 on admission, Cr normalized - f/u with bmet in AM     Cirrhosis ([place]) - Cirrhosis by abdominal U/S performed 07/23/20.  Repeat U/S 09/13/20 revealed cirrhotic liver morphology with sequela portal hypertension; mural based thrombus noted in right hepatic vein and moderate volume complex ascites noted.  Fibroscan 09/30/20 c/w advanced fibrosis/cirrhosis.  Liver biopsy deferred as risks outweigh the benefits. Repeat abdominal U/S 02/11/21 overall stable with improved  ascites.  Saw GI 08/01/21 with no new recommendations. Abdominal U/S 08/13/21 c/w cirrhosis with small amount of ascites in upper abdomen.  Fibroscan performed  09/30/21.  Saw GI 01/30/22 with recommendations for low sodium diet, abdominal U/S (performed 02/17/22), AFP (done 02/02/22 and normal), fibroscan in 09/2022 and 50-monthfollow up (scheduled for 07/31/22).   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   -cannot safely anticoagulate due to significant thrombocytopenia     History of DVT (deep vein thrombosis) - History of LLE DVT at time of PV diagnosis (~ 2005) -See hepatic vein thrombosis   Prophylactic antibiotic - Patient treated with prophylactic posaconazole, acyclovir, Levaquin per outpatient oncology notes - Continue posaconazole and acyclovir - Held Levaquin in setting of recent diarrhea - stool studies pos for norovirus above   Transaminitis - History of this previously but had improved.  Thought that it could be due to his chemotherapy agents. 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  Treated with therapeutic phlebotomy in the past  Hypervolemic hyponatremia -grossly overloaded on exam with markedly edematous LE -Suspect related to known cirrhosis hx -Will check 2d echo -Start IV lasix with albumin -follow I/o -Recheck bmet in AM      Subjective: complaining of LE swelling  Physical Exam: Vitals:   07/26/22 1523 07/26/22 2021 07/27/22 0450 07/27/22 1245  BP: (!) 130/58 130/62 (!) 121/50 (!) 109/50  Pulse: 85 93 84 82  Resp: _0 Temp: 98.5 F (36.9 C) 99 F (37.2 C) 98 F (36.7 C) 98.5 F (36.9 C)  TempSrc: Oral Oral Oral Oral  SpO2: 97% 98% 96% 98%  Weight:      Height:       General exam: Awake, laying in bed, in nad Respiratory system: Normal respiratory effort, no wheezing Cardiovascular system: regular rate, s1,  s2 Gastrointestinal system: Soft, nondistended, positive BS Central nervous system: CN2-12 grossly intact, strength intact Extremities: Perfused, no clubbing, BLE edema, pitting Skin: Normal skin turgor, no notable skin lesions seen Psychiatry: Mood normal // no visual hallucinations    Data Reviewed:  Labs reviewed: Na 126, K 3.4, Cr 1.05, WBC 0.2, Hgb 6.9, Plts 7  Family Communication: Pt in room, family not at bedside  Disposition: Status is: Inpatient Remains inpatient appropriate because: Severity of illness  Planned Discharge Destination: Home     Author: SMarylu Lund MD 07/27/2022 3:15 PM  For on call review [email or web address]

## 2022-07-27 NOTE — Care Management Important Message (Signed)
Important Message  Patient Details IM Letter given. Name: Benjamin [name] MRN: [id number] Date of Birth: [birth year]   Medicare Important Message Given:  Yes     Kerin Salen 07/27/2022, 11:20 AM

## 2022-07-27 NOTE — Progress Notes (Signed)
Pharmacy Antibiotic Note  Benjamin [name] is a 71 y.o. male admitted on 07/19/2022 with  febrile neutropenia .  PMH significant for AML. Pt was started on piperacillin/tazobactam on admission. GI panel + for norovirus.   Oncology broadened antibiotics to meropenem on 11/30 for continued fevers on piperacillin/tazobactam and pancytopenia. Pharmacy has been consulted for meropenem dosing.  Today, 07/27/22 Pt remains pancytopenic. WBC 0.2, ANC 0 SCr WNL and stable Afebrile since 12/2  Today is day #5 of meropenem. Cultures remain negative.  Plan: Continue meropenem 1 g IV q8h Monitor renal function, culture data  Height: '6\' 1"'$  (185.4 cm) Weight: 73 kg (161 lb) (stated) IBW/kg (Calculated) : 79.9  Temp (24hrs), Avg:98.3 F (36.8 C), Min:97.6 F (36.4 C), Max:99 F (37.2 C)  Recent Labs  Lab 07/23/22 0306 07/24/22 0240 07/25/22 0302 07/26/22 0035 07/26/22 2229 07/27/22 0208  WBC 0.2* 0.3* 0.3* 0.2* 0.3* 0.2*  CREATININE 0.94 0.99 0.98 0.95  --  1.05     Estimated Creatinine Clearance: 64.7 mL/min (by C-G formula based on SCr of 1.05 mg/dL).    Allergies  Allergen Reactions   Aleve [Naproxen Sodium] Other (See Comments)    Pt thinks it might be all NSAIDs but is unsure. Caused him to have emergency stomach surgery.   Biaxin [Clarithromycin] Other (See Comments)    Joint pains   Cephalexin Hives and Rash   Flagyl [Metronidazole] Other (See Comments)    Joint pain   Morphine And Related Hives    Antimicrobials this admission: Piperacillin/tazobactam 11/26 >> 11/30 meropenem 11/30 >>   Dose adjustments this admission:  Microbiology results: 12/2 UCx: ngF 11/26 BCx: ngF 11/30 repeat BCx: ngtd  11/26 GI panel: Norovirus 11/26 C.diff : Negative  [name], PharmD 07/27/2022 10:21 AM

## 2022-07-27 NOTE — Progress Notes (Signed)
He really had no problems yesterday.  Unfortunately, I think we are still having problems with his blood counts.  I really need to call [facility] and find that when his last bone marrow biopsy was.  He got a unit of blood yesterday.  He got some platelets yesterday.  Unfortunately, he really has had no bump in either.  This morning, his white count is 200.  Hemoglobin 6.9.  Platelet count 7000.  He has had no obvious bleeding.  His sodium is 126.  Potassium 3.4.  Chloride 97.  I am sure that he may have a little bit of dehydration.  It is hard to say how much he is really eating.  He has had no problems with nausea or vomiting.  There has been no diarrhea.  He came in with the norovirus.  I am sure this is what triggered the weakness and fatigue.  This has resolved now.  Again, I really need to see when his last bone marrow biopsy was.  Vital signs show temperature of 98.  Pulse 84.  Blood pressure 121/58.  I am unsure how much she is really is getting out of bed.  I think he may have walked a little bit this weekend.  He just looks more frail.  His albumin is only 2.  Again, I will try to hold off and transfuse him today.  His labs look pretty stable.  I would like to see where his blood counts trend.  We could try is Procrit.  His erythropoietin level which was done a couple years ago was 49 which is on the lower side for his condition.  I do not see a downside of trying Procrit.  We may have to think about using GI for CSF despite him having this leukemia to try to get his white cell count up.  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  I know that this is incredibly complicated.  I also know that he is getting wonderful care from everybody upon 4 E.  Lattie Haw, MD  Oswaldo Milian 9:6

## 2022-07-28 ENCOUNTER — Inpatient Hospital Stay (HOSPITAL_COMMUNITY): Payer: Medicare PPO

## 2022-07-28 DIAGNOSIS — C9201 Acute myeloblastic leukemia, in remission: Secondary | ICD-10-CM

## 2022-07-28 DIAGNOSIS — R5081 Fever presenting with conditions classified elsewhere: Secondary | ICD-10-CM | POA: Diagnosis not present

## 2022-07-28 DIAGNOSIS — D709 Neutropenia, unspecified: Secondary | ICD-10-CM | POA: Diagnosis not present

## 2022-07-28 DIAGNOSIS — I509 Heart failure, unspecified: Secondary | ICD-10-CM

## 2022-07-28 DIAGNOSIS — C9200 Acute myeloblastic leukemia, not having achieved remission: Secondary | ICD-10-CM | POA: Diagnosis not present

## 2022-07-28 DIAGNOSIS — N179 Acute kidney failure, unspecified: Secondary | ICD-10-CM | POA: Diagnosis not present

## 2022-07-28 LAB — CULTURE, BLOOD (ROUTINE X 2)
Culture: NO GROWTH
Culture: NO GROWTH
Special Requests: ADEQUATE

## 2022-07-28 LAB — COMPREHENSIVE METABOLIC PANEL
ALT: 37 U/L (ref 0–44)
AST: 31 U/L (ref 15–41)
Albumin: 1.9 g/dL — ABNORMAL LOW (ref 3.5–5.0)
Alkaline Phosphatase: 147 U/L — ABNORMAL HIGH (ref 38–126)
Anion gap: 7 (ref 5–15)
BUN: 27 mg/dL — ABNORMAL HIGH (ref 8–23)
CO2: 24 mmol/L (ref 22–32)
Calcium: 7.2 mg/dL — ABNORMAL LOW (ref 8.9–10.3)
Chloride: 98 mmol/L (ref 98–111)
Creatinine, Ser: 0.98 mg/dL (ref 0.61–1.24)
GFR, Estimated: >60 mL/min (ref >60)
Glucose, Bld: 118 mg/dL — ABNORMAL HIGH (ref 70–99)
Potassium: 4 mmol/L (ref 3.5–5.1)
Sodium: 129 mmol/L — ABNORMAL LOW (ref 135–145)
Total Bilirubin: 1.2 mg/dL (ref 0.3–1.2)
Total Protein: 5.2 g/dL — ABNORMAL LOW (ref 6.5–8.1)

## 2022-07-28 LAB — CBC WITH DIFFERENTIAL/PLATELET
Abs Immature Granulocytes: 0 10*3/uL (ref 0.00–0.07)
Basophils Absolute: 0 10*3/uL (ref 0.0–0.1)
Basophils Relative: 0 %
Eosinophils Absolute: 0 10*3/uL (ref 0.0–0.5)
Eosinophils Relative: 0 %
HCT: 20 % — ABNORMAL LOW (ref 39.0–52.0)
Hemoglobin: 6.8 g/dL — CL (ref 13.0–17.0)
Immature Granulocytes: 0 %
Lymphocytes Relative: 84 %
Lymphs Abs: 0.2 10*3/uL — ABNORMAL LOW (ref 0.7–4.0)
MCH: 28.5 pg (ref 26.0–34.0)
MCHC: 34 g/dL (ref 30.0–36.0)
MCV: 83.7 fL (ref 80.0–100.0)
Monocytes Absolute: 0 10*3/uL — ABNORMAL LOW (ref 0.1–1.0)
Monocytes Relative: 11 %
Neutro Abs: 0 10*3/uL — CL (ref 1.7–7.7)
Neutrophils Relative %: 5 %
Platelets: 5 10*3/uL — CL (ref 150–400)
RBC: 2.39 MIL/uL — ABNORMAL LOW (ref 4.22–5.81)
RDW: 15.1 % (ref 11.5–15.5)
WBC: 0.2 10*3/uL — CL (ref 4.0–10.5)
nRBC: 0 % (ref 0.0–0.2)

## 2022-07-28 LAB — ECHOCARDIOGRAM COMPLETE
AR max vel: 2.76 cm2
AV Area VTI: 2.85 cm2
AV Area mean vel: 2.64 cm2
AV Mean grad: 4 mmHg
AV Peak grad: 6.3 mmHg
Ao pk vel: 1.25 m/s
Area-P 1/2: 4.31 cm2
Calc EF: 52.7 %
Height: 73 in
MV M vel: 3.01 m/s
MV Peak grad: 36.2 mmHg
S' Lateral: 3.1 cm
Single Plane A2C EF: 57 %
Single Plane A4C EF: 50.3 %
Weight: 2576 oz

## 2022-07-28 LAB — PREPARE RBC (CROSSMATCH)

## 2022-07-28 MED ORDER — FUROSEMIDE 10 MG/ML IJ SOLN
40.0000 mg | Freq: Once | INTRAMUSCULAR | Status: AC
  Administered 2022-07-28: 40 mg via INTRAVENOUS
  Filled 2022-07-28: qty 4

## 2022-07-28 MED ORDER — SODIUM CHLORIDE 0.9% IV SOLUTION: Freq: Once | INTRAVENOUS | Status: AC

## 2022-07-28 MED ORDER — LEVOFLOXACIN 500 MG PO TABS
500.0000 mg | ORAL_TABLET | Freq: Every day | ORAL | Status: DC
  Administered 2022-07-28 – 2022-07-29 (×2): 500 mg via ORAL
  Filled 2022-07-28 (×2): qty 1

## 2022-07-28 NOTE — Progress Notes (Signed)
His blood counts really no better.  This morning, his white count 200.  Hemoglobin 6.8.  Platelet count 5000.  Will go ahead and give him 1 unit of blood and a unit of platelets.  He has been afebrile.  I really would like to try to get him home.  Again I still think that his blood counts are really going get much better.  His cultures have all been negative.  Will stop the meropenem.  I will put him on Levaquin as a prophylactic type measure.  He says he is ready to go home.  I think he will follow-up with [facility] on Thursday.  Again I do not know if his blood counts low because of the leukemia or because of his treatments.  His chemistry studies look okay.  Sodium 129.  Potassium 4.  BUN 27 creatinine 0.98.  Platelet count 7.2 with an albumin of 1.9.  Benjamin [name] says he is eating okay.  I am sure he will eat better at home.  He has had no diarrhea.  The Norovirus which brought him in here is resolved.  He has had no obvious bleeding.  I have him on Amicar tablets and Amicar mouth rinse.  He has had no cough.  Has had no nausea or vomiting.  His vital signs are temperature 98.2.  Pulse 80.  Blood pressure 123/49.  His head and neck exam does not show any mucositis.  He does have some small petechia in the oral cavity.  His lungs are clear bilaterally.  Cardiac exam regular rate and rhythm.  Abdomen is soft.  Bowel sounds are present.  His spleen tip is at the left costal margin.  Extremity shows no clubbing, cyanosis or edema.  Neurological exam shows no focal neurological deficits.  Again, I think Benjamin [name] is able to go home.  He may need to go home tomorrow depending on when he can get his transfusions.  He clearly will need the Levaquin.  He will need the Amicar.  Again, he should be all set up at [facility] for lab work on Thursday.  I do appreciate the help he had on 4 E.  This is incredibly complicated.  He does have a bad problem.  Thankfully, cultures were all negative.  I suspect  that the Norovirus is what really brought him into the hospital.  Benjamin Haw, MD  [name] 5:2

## 2022-07-28 NOTE — Progress Notes (Signed)
Progress Note   Patient: Benjamin [name] KPQ:[id number] DOB: [birth year] DOA: 07/19/2022     9 DOS: the patient was seen and examined on 07/28/2022   Brief hospital course: Benjamin [name] is a 71 year old male with PMH Jak-2 positive MPN with MF (diagnosed 02/20/20, managed with hydrea) then found to have blast crisis (15% blasts 07/11/20) so treated like AML. 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  He follows with [facility] oncology, last office visit reviewed from 06/25/2022. Other PMH includes hx DVT, hepatic thrombosis, GERD, PUD.   He presented to the hospital due to generalized weakness for approximately 3 to 4 days prior to hospitalization.  He also developed diarrhea prior to admission; he had reported that he was on the toilet and unable to stand up due to weakness thus prompting call for EMS. He met criteria for sepsis on admission and was placed on zosyn.  Stool studies were ordered due to diarrhea.  CXR obtained on admission and was unremarkable.  CT renal stone protocol showed fluid throughout colon extending to distal rectum compatible with diarrhea (no colonic wall thickening or pericolonic edema).  There was cholelithiasis with subtle pericholecystic fluid towards the fundus.  Splenomegaly was also appreciated with prominence of the portal and splenic veins. Right upper quadrant ultrasound was then obtained which again showed cholelithiasis with diffuse gallbladder wall thickening and pericholecystic fluid but no sonographic Murphy sign and findings were considered indeterminate for acute cholecystitis.  Lab workup revealed pancytopenia.  ANC 0 on admission. Hgb 7.3 g/dL, PLTC < 5.  He was started on blood and platelet transfusions.  Oncology was also consulted on admission.  Assessment and Plan: * Febrile neutropenia ([place]) - Imaging workup mostly notable for equivocal cholecystitis as noted by cholelithiasis  with diffuse gallbladder wall thickening and pericholecystic fluid but negative sonographic Murphy sign -C. difficile negative.  GI panel detected norovirus. 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  - Last marrow 01/26/22 negative by morphology and cytogenetics but JAK2 positive. 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  - Plts down to 6.8 despite recent PRBC transfusion -has required total 4 units PRBC's this admission, receiving PRBC tx currently per Hematology   Normocytic anemia - Hgb 7.3 g/dL on admission - Hgb 6.8 this AM -Total this visit, required total 4 units PRBC transfusion -trial of Procrit given per Hematology - receiving prbc tx today -recheck cbc in AM   Thrombocytopenia ([place]) - PLTC < 5 on admission - Plts down to [place] today without bleeding -Per Hematology, given trial of Amicar to make plts more functional -Has required total 8 units platelets this admission, including one given today   AKI (acute kidney injury) ([name])-resolved as of 07/21/2022 - - baseline creatinine ~ 0.8 -Suspected due to GI losses and hypovolemia - Creatinine 1.62 on admission, Cr normalized - f/u with bmet in AM     Cirrhosis ([place]) - Cirrhosis by abdominal U/S performed 07/23/20.  Repeat U/S 09/13/20 revealed cirrhotic liver morphology with sequela portal hypertension; mural based thrombus noted in right hepatic vein and moderate volume complex ascites noted.  Fibroscan 09/30/20 c/w advanced fibrosis/cirrhosis.  Liver biopsy deferred as risks outweigh the benefits. Repeat abdominal U/S 02/11/21 overall stable with improved ascites.  Saw GI 08/01/21 with no new recommendations. Abdominal U/S 08/13/21 c/w cirrhosis with small amount of ascites in upper  abdomen.  Fibroscan performed 09/30/21.  Saw GI 01/30/22 with recommendations for low sodium diet, abdominal U/S (performed  02/17/22), AFP (done 02/02/22 and normal), fibroscan in 09/2022 and 85-monthfollow up (scheduled for 07/31/22).   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   -cannot safely anticoagulate due to significant thrombocytopenia     History of DVT (deep vein thrombosis) - History of LLE DVT at time of PV diagnosis (~ 2005) -See hepatic vein thrombosis   Prophylactic antibiotic - Patient treated with prophylactic posaconazole, acyclovir, Levaquin per outpatient oncology notes - Continue posaconazole and acyclovir - Held Levaquin in setting of recent diarrhea - stool studies pos for norovirus above   Transaminitis - History of this previously but had improved.  Thought that it could be due to his chemotherapy agents. 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  Treated with therapeutic phlebotomy in the past  Hypervolemic hyponatremia -grossly overloaded on exam with markedly edematous LE -Suspect related to multiple transfusions in setting of above cirrhosis -2d echo reviewed, normal LVEF and normal LV diastolic parameters -receiving lasix with improvement -Recheck bmet in AM      Subjective: Initially declined lasix because "it makes me pee too much" but now agreeable  Physical Exam: Vitals:   07/28/22 1113 07/28/22 1146 07/28/22 1430 07/28/22 1459  BP: (!) 116/54 (!) 121/57 (!) 131/57 (!) 128/56  Pulse: 76 77 78 77  Resp: _0 Temp: 97.7 F (36.5 C) 98.5 F (36.9 C) 98.3 F (36.8 C) 98.3 F (36.8 C)  TempSrc: Oral Axillary Axillary Axillary  SpO2: 97% 96% 98%   Weight:      Height:       General exam: Conversant, in no acute distress Respiratory system: normal chest rise, clear, no audible wheezing Cardiovascular system:  regular rhythm, s1-s2 Gastrointestinal system: Nondistended, nontender, pos BS Central nervous system: No seizures, no tremors Extremities: No cyanosis, no joint deformities, BLE edema, pitting Skin: No rashes, no pallor Psychiatry: Affect normal // no auditory hallucinations   Data Reviewed:  Labs reviewed: Na 129, K 4.0, Cr 0.98, WBC 0.2, Hgb 6.8, Plts 5  Family Communication: Pt in room, pt's wife at bedside  Disposition: Status is: Inpatient Remains inpatient appropriate because: Severity of illness  Planned Discharge Destination: Home     Author: SMarylu Lund MD 07/28/2022 3:07 PM  For on call review [email or web address]

## 2022-07-28 NOTE — Progress Notes (Signed)
  Echocardiogram 2D Echocardiogram has been performed.  Benjamin [name] 07/28/2022, 10:46 AM

## 2022-07-29 ENCOUNTER — Other Ambulatory Visit (HOSPITAL_COMMUNITY): Payer: Self-pay

## 2022-07-29 DIAGNOSIS — D696 Thrombocytopenia, unspecified: Secondary | ICD-10-CM | POA: Diagnosis not present

## 2022-07-29 DIAGNOSIS — Z86718 Personal history of other venous thrombosis and embolism: Secondary | ICD-10-CM

## 2022-07-29 DIAGNOSIS — D709 Neutropenia, unspecified: Secondary | ICD-10-CM

## 2022-07-29 DIAGNOSIS — N179 Acute kidney failure, unspecified: Secondary | ICD-10-CM | POA: Diagnosis not present

## 2022-07-29 DIAGNOSIS — R5081 Fever presenting with conditions classified elsewhere: Secondary | ICD-10-CM | POA: Diagnosis not present

## 2022-07-29 DIAGNOSIS — K219 Gastro-esophageal reflux disease without esophagitis: Secondary | ICD-10-CM

## 2022-07-29 DIAGNOSIS — D649 Anemia, unspecified: Secondary | ICD-10-CM

## 2022-07-29 DIAGNOSIS — C9201 Acute myeloblastic leukemia, in remission: Secondary | ICD-10-CM | POA: Diagnosis not present

## 2022-07-29 DIAGNOSIS — I820 Budd-Chiari syndrome: Secondary | ICD-10-CM

## 2022-07-29 LAB — CBC WITH DIFFERENTIAL/PLATELET
Abs Immature Granulocytes: 0 10*3/uL (ref 0.00–0.07)
Abs Immature Granulocytes: 0.01 10*3/uL (ref 0.00–0.07)
Basophils Absolute: 0 10*3/uL (ref 0.0–0.1)
Basophils Absolute: 0 10*3/uL (ref 0.0–0.1)
Basophils Relative: 0 %
Basophils Relative: 0 %
Eosinophils Absolute: 0 10*3/uL (ref 0.0–0.5)
Eosinophils Absolute: 0 10*3/uL (ref 0.0–0.5)
Eosinophils Relative: 0 %
Eosinophils Relative: 0 %
HCT: 24.1 % — ABNORMAL LOW (ref 39.0–52.0)
HCT: 23 % — ABNORMAL LOW (ref 39.0–52.0)
Hemoglobin: 8 g/dL — ABNORMAL LOW (ref 13.0–17.0)
Hemoglobin: 7.8 g/dL — ABNORMAL LOW (ref 13.0–17.0)
Immature Granulocytes: 0 %
Immature Granulocytes: 4 %
Lymphocytes Relative: 72 %
Lymphocytes Relative: 79 %
Lymphs Abs: 0.2 10*3/uL — ABNORMAL LOW (ref 0.7–4.0)
Lymphs Abs: 0.2 10*3/uL — ABNORMAL LOW (ref 0.7–4.0)
MCH: 27.7 pg (ref 26.0–34.0)
MCH: 28.6 pg (ref 26.0–34.0)
MCHC: 33.2 g/dL (ref 30.0–36.0)
MCHC: 33.9 g/dL (ref 30.0–36.0)
MCV: 83.4 fL (ref 80.0–100.0)
MCV: 84.2 fL (ref 80.0–100.0)
Monocytes Absolute: 0 10*3/uL — ABNORMAL LOW (ref 0.1–1.0)
Monocytes Absolute: 0 10*3/uL — ABNORMAL LOW (ref 0.1–1.0)
Monocytes Relative: 14 %
Monocytes Relative: 13 %
Neutro Abs: 0 10*3/uL — CL (ref 1.7–7.7)
Neutro Abs: 0 10*3/uL — CL (ref 1.7–7.7)
Neutrophils Relative %: 14 %
Neutrophils Relative %: 4 %
Platelets: 7 10*3/uL — CL (ref 150–400)
Platelets: 7 10*3/uL — CL (ref 150–400)
RBC: 2.89 MIL/uL — ABNORMAL LOW (ref 4.22–5.81)
RBC: 2.73 MIL/uL — ABNORMAL LOW (ref 4.22–5.81)
RDW: 15 % (ref 11.5–15.5)
RDW: 15 % (ref 11.5–15.5)
WBC: 0.2 10*3/uL — CL (ref 4.0–10.5)
WBC: 0.2 10*3/uL — CL (ref 4.0–10.5)
nRBC: 0 % (ref 0.0–0.2)
nRBC: 0 % (ref 0.0–0.2)

## 2022-07-29 LAB — TYPE AND SCREEN
ABO/RH(D): B POS
Antibody Screen: NEGATIVE
Unit division: 0
Unit division: 0

## 2022-07-29 LAB — BPAM RBC
Blood Product Expiration Date: 202312222359
Blood Product Expiration Date: 202312292359
ISSUE DATE / TIME: 202312030840
ISSUE DATE / TIME: 202312051446
Unit Type and Rh: 7300
Unit Type and Rh: 7300

## 2022-07-29 LAB — COMPREHENSIVE METABOLIC PANEL
ALT: 38 U/L (ref 0–44)
AST: 35 U/L (ref 15–41)
Albumin: 1.9 g/dL — ABNORMAL LOW (ref 3.5–5.0)
Alkaline Phosphatase: 197 U/L — ABNORMAL HIGH (ref 38–126)
Anion gap: 7 (ref 5–15)
BUN: 25 mg/dL — ABNORMAL HIGH (ref 8–23)
CO2: 25 mmol/L (ref 22–32)
Calcium: 7.3 mg/dL — ABNORMAL LOW (ref 8.9–10.3)
Chloride: 96 mmol/L — ABNORMAL LOW (ref 98–111)
Creatinine, Ser: 0.94 mg/dL (ref 0.61–1.24)
GFR, Estimated: >60 mL/min (ref >60)
Glucose, Bld: 120 mg/dL — ABNORMAL HIGH (ref 70–99)
Potassium: 3.7 mmol/L (ref 3.5–5.1)
Sodium: 128 mmol/L — ABNORMAL LOW (ref 135–145)
Total Bilirubin: 1.7 mg/dL — ABNORMAL HIGH (ref 0.3–1.2)
Total Protein: 4.8 g/dL — ABNORMAL LOW (ref 6.5–8.1)

## 2022-07-29 LAB — BPAM PLATELET PHERESIS
Blood Product Expiration Date: 202312082359
ISSUE DATE / TIME: 202312051123
Unit Type and Rh: 7300

## 2022-07-29 LAB — PREPARE PLATELET PHERESIS: Unit division: 0

## 2022-07-29 MED ORDER — POSACONAZOLE 100 MG PO TBEC
300.0000 mg | DELAYED_RELEASE_TABLET | Freq: Every day | ORAL | 0 refills | Status: DC
  Filled 2022-07-29: qty 60, 20d supply, fill #0
  Filled 2022-07-29: qty 30, 10d supply, fill #0

## 2022-07-29 MED ORDER — LEVOFLOXACIN 500 MG PO TABS
500.0000 mg | ORAL_TABLET | Freq: Every day | ORAL | 0 refills | Status: AC
  Filled 2022-07-29: qty 30, 30d supply, fill #0

## 2022-07-29 MED ORDER — SODIUM BICARBONATE/SODIUM CHLORIDE MOUTHWASH
OROMUCOSAL | Status: DC
  Administered 2022-07-29: 1 via OROMUCOSAL
  Filled 2022-07-29: qty 1000

## 2022-07-29 MED ORDER — EPOETIN ALFA 20000 UNIT/ML IJ SOLN
40000.0000 [IU] | INTRAMUSCULAR | Status: DC
  Filled 2022-07-29: qty 2

## 2022-07-29 MED ORDER — POSACONAZOLE 100 MG PO TBEC
300.0000 mg | DELAYED_RELEASE_TABLET | Freq: Every day | ORAL | 0 refills | Status: DC
  Filled 2022-07-29: qty 42, 14d supply, fill #0

## 2022-07-29 MED ORDER — AMINOCAPROIC ACID SOLUTION 5% (50 MG/ML)
10.0000 mL | ORAL | 1 refills | Status: DC
  Filled 2022-07-29: qty 1800, 30d supply, fill #0

## 2022-07-29 MED ORDER — AMINOCAPROIC ACID 1000 MG PO TABS
1000.0000 mg | ORAL_TABLET | Freq: Three times a day (TID) | ORAL | 0 refills | Status: DC
  Filled 2022-07-29 (×2): qty 60, 20d supply, fill #0
  Filled 2022-07-29 (×2): qty 30, 10d supply, fill #0

## 2022-07-29 MED ORDER — SACCHAROMYCES BOULARDII 250 MG PO CAPS
250.0000 mg | ORAL_CAPSULE | Freq: Two times a day (BID) | ORAL | Status: DC
  Administered 2022-07-29: 250 mg via ORAL
  Filled 2022-07-29: qty 1

## 2022-07-29 MED ORDER — SACCHAROMYCES BOULARDII 250 MG PO CAPS: 250.0000 mg | ORAL_CAPSULE | Freq: Two times a day (BID) | ORAL | Status: DC

## 2022-07-29 MED ORDER — ACETAMINOPHEN 325 MG PO TABS: 325.0000 mg | ORAL_TABLET | Freq: Four times a day (QID) | ORAL | Status: DC | PRN

## 2022-07-29 MED ORDER — ENSURE ENLIVE PO LIQD: 237.0000 mL | Freq: Two times a day (BID) | ORAL | 12 refills | Status: DC

## 2022-07-29 MED ORDER — HEPARIN SOD (PORK) LOCK FLUSH 100 UNIT/ML IV SOLN
250.0000 [IU] | INTRAVENOUS | Status: AC | PRN
  Administered 2022-07-29: 250 [IU]

## 2022-07-29 MED ORDER — AMINOCAPROIC ACID SOLUTION 5% (50 MG/ML): 10.0000 mL | ORAL | 1 refills | Status: DC

## 2022-07-29 NOTE — Progress Notes (Signed)
CSW dropped off the walker at the patient's room.

## 2022-07-29 NOTE — Discharge Summary (Signed)
Physician Discharge Summary  Benjamin [name] MWU:[id number] DOB: 14-Jan-1951 DOA: 07/19/2022  PCP: Benjamin Bunting, MD  Admit date: 07/19/2022 Discharge date: 07/29/2022  Time spent: 60 minutes  Recommendations for Outpatient Follow-up:  Follow-up with Benjamin [name], hematology/oncology [facility] as previously scheduled within 1 week.  On follow-up patient will need CBC with differential done to follow-up on counts as well as basic metabolic profile done to follow-up with electrolytes and renal function. Follow-up with Benjamin Bunting, MD in 2 weeks.  On follow-up patient need a comprehensive metabolic profile done to follow-up on electrolytes and renal function.  Patient need a CBC done to follow-up on counts.   Discharge Diagnoses:  Principal Problem:   Febrile neutropenia ([name]) Active Problems:   Acute myeloid leukemia ([name])   Thrombocytopenia ([name])   Normocytic anemia   Cirrhosis ([name])   Hepatic vein thrombosis ([name])   History of DVT (deep vein thrombosis)   Prophylactic antibiotic   Transaminitis   P. vera ([name])   Erythropoietin deficiency anemia   Hypokalemia   GERD (gastroesophageal reflux disease)   Malnutrition of moderate degree   Discharge Condition: Stable and improved.  Diet recommendation: Regular  Filed Weights   07/21/22 1628  Weight: 73 kg    History of present illness:  HPI per Benjamin. Theodis Blaze MORRISON [name] is a 71 y.o. 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   Patient reports 3 to 4 days of generalized weakness.  Since that time he has been largely bedbound and has been unable to stand for extended periods of time.  This morning he was unable to get off the toilet which is what prompted his wife to call EMS.  He was transported to the  ED for further evaluation.   He states he follows with [facility] for his hematology/oncology care.  He receives transfusions at times and has had issues with his platelets recently.   He does report a few days of urinary dribbling which is new for him and diarrhea with very watery stools.  He denies fevers, chills, chest pain, shortness of breath, abdominal pain, constipation, diarrhea, nausea, vomiting.   ED Course: Vital signs in the ED significant for fever to 101.3, heart rate in the 80s to 100s, blood pressure in the 72Z to 366 systolic, respiratory rate in the teens to 20s saturating well on room air.  Lab workup included CMP with sodium 133, potassium 2.9, bicarb 20, creatinine elevated to 1.62 from baseline of 0.8, BUN 44, glucose 177, calcium 7.6 which corrects to 8.5 when considering albumin of 2.9, protein 5.9, AST 55 which has been intermittently elevated in the past, ALT 90 which has been intermittently elevated in past, ALP 138 which has been intermittently elevated in the past.  T. bili stable at 1.7.  CBC showed hemoglobin stable at around 7.3, platelets less than 5, white count of 0.1 with ANC of 0.0.  PT of 22.4 and INR of 2.  Lactic acid normal with repeat pending.  RVP for flu and COVID-negative.  Patient typed and screened in the ED.  Urinalysis with hemoglobin, protein, rare bacteria.  C. difficile panel and blood cultures pending.  GI pathogen panel pending.  Imaging studies included chest x-ray which showed no acute normality.  CT renal stone study which showed fluid in the  colon consistent with diarrhea, no wall thickening nor paracolic edema.  There was evidence of gallbladder stone and subtle pericholecystic fluid, splenomegaly noted as well as trace free fluid around the liver, prostatomegaly with evidence of prior TURP noted right upper quadrant ultrasound performed which showed gallbladder wall thickening and pericholecystic fluid however negative sonographic Murphy  sign's.  Patient started on Zosyn in the ED.  Received Tylenol a liter of fluids and 60 mEq of p.o. potassium.  1 unit of platelets and 2 units of packed red blood cells have been ordered.  Case was discussed with hematology/oncology here who states patient is appropriate to remain in our hospital system and request cefepime be started, they will see the patient while admitted.   Hospital Course:   Febrile neutropenia ([place]) - Imaging workup mostly notable for equivocal cholecystitis as noted by cholelithiasis with diffuse gallbladder wall thickening and pericholecystic fluid but negative sonographic Murphy sign -C. difficile negative.  GI panel detected norovirus. GI symptoms resolved -Completed empiric abx per Oncology -Patient remained afebrile, counts remained stable. -Patient cleared by hematology/oncology for discharge. -Close outpatient follow-up with hematologist as previously scheduled.   Acute myeloid leukemia ([place]) - Follows closely with oncology at [facility].  - Last marrow 01/26/22 negative by morphology and cytogenetics but JAK2 positive. 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  - Hemoglobin noted to have gone down down to 6.8 despite recent PRBC transfusion -has required total 4 units PRBC's this admission, and hemoglobin noted to have stabilized at 7.8 by day of discharge.   -Patient seen by hematology during this hospitalization will follow-up with primary hematologist in the outpatient setting.    Normocytic anemia - Hgb 7.3 g/dL on admission - Hgb 6.8 trended down during the hospitalization. -Status post 4 units packed red blood cells during his hospitalization, trial of Procrit which was given by hematology. -Hemoglobin had stabilized at 7.8 by day of discharge. -Patient was followed by hematology throughout the hospitalization will follow-up with primary hematologist/oncologist in the outpatient setting.   Thrombocytopenia ([place]) - PLTC < 5 on  admission -Per Hematology, given trial of Amicar to make plts more functional -Has required total 8 units platelets this admission. -Patient also placed on some Procrit to help with his anemia per hematology. -Platelet count was down to 7 by day of discharge. -Outpatient follow-up with primary hematology/oncologist.   AKI (acute kidney injury) ([name])-resolved as of 07/21/2022 - - baseline creatinine ~ 0.8 -Suspected due to GI losses and hypovolemia - Creatinine 1.62 on admission, Cr normalized and was down to 0.94 by day of discharge.     Cirrhosis ([place]) - Cirrhosis by abdominal U/S performed 07/23/20.  Repeat U/S 09/13/20 revealed cirrhotic liver morphology with sequela portal hypertension; mural based thrombus noted in right hepatic vein and moderate volume complex ascites noted.  Fibroscan 09/30/20 c/w advanced fibrosis/cirrhosis.  Liver biopsy deferred as risks outweigh the benefits. Repeat abdominal U/S 02/11/21 overall stable with improved ascites.  Saw GI 08/01/21 with no new recommendations. Abdominal U/S 08/13/21 c/w cirrhosis with small amount of ascites in upper abdomen.  Fibroscan performed 09/30/21.  Saw GI 01/30/22 with recommendations for low sodium diet, abdominal U/S (performed 02/17/22), AFP (done 02/02/22 and normal), fibroscan in 09/2022 and 22-monthfollow up (scheduled for 07/31/22) -Outpatient follow-up with GI as previously scheduled..   Hepatic vein thrombosis ([place] based thrombus in R hepatic vein:  Seen on abd u/s with dopplers done 09/13/20.   -cannot safely anticoagulate due to significant  thrombocytopenia -Outpatient follow-up with primary hematologist/oncologist.     History of DVT (deep vein thrombosis) - History of LLE DVT at time of PV diagnosis (~ 2005) -See hepatic vein thrombosis   Prophylactic antibiotic - Patient treated with prophylactic posaconazole, acyclovir, Levaquin per outpatient oncology notes - Continued on posaconazole and acyclovir -  Held Levaquin in setting of recent diarrhea - Once diarrhea had improved/resolved Levaquin was resumed by hematology/oncology.   -Outpatient follow-up.    Transaminitis - History of this previously but had improved.  Thought that it could be due to his chemotherapy agents. - Holding chemotherapy medications during the hospitalization. -LFTs trended down and transaminitis had resolved by day of discharge. -Outpatient follow-up.   GERD (gastroesophageal reflux disease) - no PPI noted on med rec; PPI held during the hospitalization.  -Outpatient follow-up.     Hypokalemia -Repleted during the hospitalization.   -Potassium noted at 3.7 by day of discharge.  -Outpatient follow-up.   P. vera ([place]) - Diagnosed around 2005.  Treated with therapeutic phlebotomy in the past -Was seen by hematology/oncology during the hospitalization. -Outpatient follow-up.   Hypervolemic hyponatremia -grossly overloaded on exam with markedly edematous LE noted during the hospitalization. -Suspect related to multiple transfusions in setting of above cirrhosis -2d echo reviewed, normal LVEF and normal LV diastolic parameters -receiving lasix with improvement with hyponatremia. -On day of discharge sodium had stabilized at 128. -Outpatient follow-up.      Procedures: CT renal stone protocol 07/19/2022 Chest x-ray 07/19/2022 Right upper quadrant ultrasound 07/19/2022 2D echo 07/28/2022 Transfused 4 units packed red blood cells Transfused 8 units platelets.    Consultations: Oncology: Benjamin. Marin Olp 07/21/2022  Discharge Exam: Vitals:   07/29/22 0643 07/29/22 1523  BP: 132/60 106/62  Pulse: 81 81  Resp: 20 19  Temp: 98 F (36.7 C) (!) 97.5 F (36.4 C)  SpO2: 97% 100%    General: Pallor.  NAD. Cardiovascular: RRR no murmurs rubs or gallops.  No JVD.  1-2+ bilateral lower extremity edema. Respiratory: Clear to auscultation bilaterally.  No wheezes, no crackles, no rhonchi.  Fair air movement.   Speaking in full sentences.  Discharge Instructions   Discharge Instructions     Care order/instruction   Complete by: As directed    Transfuse Parameters   Diet - low sodium heart healthy   Complete by: As directed    Diet general   Complete by: As directed    Increase activity slowly   Complete by: As directed    No wound care   Complete by: As directed    Type and screen   Complete by: As directed    Novi       Allergies as of 07/29/2022       Reactions   Aleve [naproxen Sodium] Other (See Comments)   Pt thinks it might be all NSAIDs but is unsure. Caused him to have emergency stomach surgery.   Biaxin [clarithromycin] Other (See Comments)   Joint pains   Cephalexin Hives, Rash   Flagyl [metronidazole] Other (See Comments)   Joint pain   Morphine And Related Hives        Medication List     STOP taking these medications    allopurinol 300 MG tablet Commonly known as: ZYLOPRIM   Magnesium 400 MG Caps   testosterone 4 MG/24HR Pt24 patch Commonly known as: ANDRODERM   Vitamin D 50 MCG (2000 UT) Caps       TAKE these medications  acetaminophen 325 MG tablet Commonly known as: TYLENOL Take 1 tablet (325 mg total) by mouth every 6 (six) hours as needed for mild pain, fever or headache.   acyclovir 400 MG tablet Commonly known as: ZOVIRAX Take 400 mg by mouth 2 (two) times daily.   Aminocaproic Acid 1000 MG Tabs Take 1 tablet (1,000 mg total) by mouth every 8 (eight) hours.   aminocaproic acid 5 % Soln Commonly known as: AMICAR Take 10 mLs by mouth every 4 (four) hours.   feeding supplement Liqd Take 237 mLs by mouth 2 (two) times daily between meals. Start taking on: July 30, 2022   levofloxacin 500 MG tablet Commonly known as: LEVAQUIN Take 1 tablet (500 mg total) by mouth daily. Start taking on: July 30, 2022   OVER THE COUNTER MEDICATION Take 1 tablet by mouth daily. Calcium, vitamin D, with Zinc    posaconazole 100 MG Tbec delayed-release tablet Commonly known as: NOXAFIL Take 3 tablets (300 mg total) by mouth daily with breakfast. Start taking on: July 30, 2022   saccharomyces boulardii 250 MG capsule Commonly known as: FLORASTOR Take 1 capsule (250 mg total) by mouth 2 (two) times daily.   venetoclax 100 MG tablet Commonly known as: VENCLEXTA Take 100 mg by mouth daily.               Durable Medical Equipment  (From admission, onward)           Start     Ordered   07/29/22 1520  For home use only DME 4 wheeled rolling walker with seat  Once       Question:  Patient needs a walker to treat with the following condition  Answer:  Debility   07/29/22 1519           Allergies  Allergen Reactions   Aleve [Naproxen Sodium] Other (See Comments)    Pt thinks it might be all NSAIDs but is unsure. Caused him to have emergency stomach surgery.   Biaxin [Clarithromycin] Other (See Comments)    Joint pains   Cephalexin Hives and Rash   Flagyl [Metronidazole] Other (See Comments)    Joint pain   Morphine And Related Hives    Follow-up Information     Benjamin Bunting, MD. Schedule an appointment as soon as possible for a visit in 2 week(s).   Specialty: Internal Medicine Contact information: [address] [place] [address] [phone]         Janyth Contes, MD. Schedule an appointment as soon as possible for a visit in 1 week(s).   Specialty: Hematology and Oncology Why: Follow-up as scheduled or within 1 week. Contact information: [place] [address] [phone]                  The results of significant diagnostics from this hospitalization (including imaging, microbiology, ancillary and laboratory) are listed below for reference.    Significant Diagnostic Studies: ECHOCARDIOGRAM COMPLETE  Result Date: 07/28/2022    ECHOCARDIOGRAM REPORT   Patient Name:   DANGELO GUZZETTA Date of Exam: 07/28/2022  Medical Rec #:  [id number]         Height:       73.0 in Accession #:    [id number]        Weight:       161.0 lb Date of Birth:  [birth year]        BSA:          1.962  m Patient Age:    22 years          BP:           123/49 mmHg Patient Gender: M                 HR:           77 bpm. Exam Location:  Inpatient Procedure: 2D Echo Indications:    CHF  History:        Patient has no prior history of Echocardiogram examinations.  Sonographer:    Harvie Junior Referring Phys: [phone] STEPHEN K CHIU  Sonographer Comments: Technically difficult study due to poor echo windows. IMPRESSIONS  1. Left ventricular ejection fraction, by estimation, is 50 to 55%. The left ventricle has low normal function. The left ventricle has no regional wall motion abnormalities. Left ventricular diastolic parameters were normal.  2. Right ventricular systolic function is normal. The right ventricular size is normal. There is normal pulmonary artery systolic pressure. The estimated right ventricular systolic pressure is 96.0 mmHg.  3. The mitral valve is grossly normal. Trivial mitral valve regurgitation. No evidence of mitral stenosis.  4. The aortic valve was not well visualized. Aortic valve regurgitation is not visualized. No aortic stenosis is present.  5. The inferior vena cava is normal in size with greater than 50% respiratory variability, suggesting right atrial pressure of 3 mmHg. FINDINGS  Left Ventricle: Left ventricular ejection fraction, by estimation, is 50 to 55%. The left ventricle has low normal function. The left ventricle has no regional wall motion abnormalities. The left ventricular internal cavity size was normal in size. There is no left ventricular hypertrophy. Left ventricular diastolic parameters were normal. Right Ventricle: The right ventricular size is normal. No increase in right ventricular wall thickness. Right ventricular systolic function is normal. There is normal pulmonary artery systolic pressure. The  tricuspid regurgitant velocity is 1.41 m/s, and  with an assumed right atrial pressure of 3 mmHg, the estimated right ventricular systolic pressure is 45.4 mmHg. Left [facility]: Left atrial size was normal in size. Right [facility]: Right atrial size was normal in size. Pericardium: Trivial pericardial effusion is present. Mitral Valve: The mitral valve is grossly normal. Trivial mitral valve regurgitation. No evidence of mitral valve stenosis. Tricuspid Valve: The tricuspid valve is grossly normal. Tricuspid valve regurgitation is trivial. No evidence of tricuspid stenosis. Aortic Valve: The aortic valve was not well visualized. Aortic valve regurgitation is not visualized. No aortic stenosis is present. Aortic valve mean gradient measures 4.0 mmHg. Aortic valve peak gradient measures 6.2 mmHg. Aortic valve area, by VTI measures 2.85 cm. Pulmonic Valve: The pulmonic valve was not well visualized. Pulmonic valve regurgitation is not visualized. Aorta: The aortic root and ascending aorta are structurally normal, with no evidence of dilitation. Venous: The inferior vena cava is normal in size with greater than 50% respiratory variability, suggesting right atrial pressure of 3 mmHg. IAS/Shunts: The atrial septum is grossly normal.  LEFT VENTRICLE PLAX 2D LVIDd:         4.10 cm     Diastology LVIDs:         3.10 cm     LV e' medial:    10.70 cm/s LV PW:         0.90 cm     LV E/e' medial:  7.3 LV IVS:        0.90 cm     LV e' lateral:   13.30 cm/s LVOT diam:  2.00 cm     LV E/e' lateral: 5.9 LV SV:         65 LV SV Index:   33 LVOT Area:     3.14 cm  LV Volumes (MOD) LV vol d, MOD A2C: 67.9 ml LV vol d, MOD A4C: 81.5 ml LV vol s, MOD A2C: 29.2 ml LV vol s, MOD A4C: 40.5 ml LV SV MOD A2C:     38.7 ml LV SV MOD A4C:     81.5 ml LV SV MOD BP:      39.2 ml RIGHT VENTRICLE RV Basal diam:  3.40 cm RV Mid diam:    2.70 cm TAPSE (M-mode): 1.8 cm LEFT ATRIUM             Index        RIGHT ATRIUM           Index LA diam:         3.40 cm 1.73 cm/m   RA Area:     12.80 cm LA Vol (A2C):   37.4 ml 19.06 ml/m  RA Volume:   30.50 ml  15.54 ml/m LA Vol (A4C):   25.7 ml 13.10 ml/m LA Biplane Vol: 31.2 ml 15.90 ml/m  AORTIC VALVE                    PULMONIC VALVE AV Area (Vmax):    2.76 cm     PV Vmax:       0.95 m/s AV Area (Vmean):   2.64 cm     PV Peak grad:  3.6 mmHg AV Area (VTI):     2.85 cm AV Vmax:           125.00 cm/s AV Vmean:          89.700 cm/s AV VTI:            0.229 m AV Peak Grad:      6.2 mmHg AV Mean Grad:      4.0 mmHg LVOT Vmax:         110.00 cm/s LVOT Vmean:        75.400 cm/s LVOT VTI:          0.208 m LVOT/AV VTI ratio: 0.91  AORTA Ao Root diam: 3.40 cm Ao Asc diam:  3.40 cm MITRAL VALVE               TRICUSPID VALVE MV Area (PHT): 4.31 cm    TR Peak grad:   8.0 mmHg MV Decel Time: 176 msec    TR Vmax:        141.00 cm/s MR Peak grad: 36.2 mmHg MR Vmax:      301.00 cm/s  SHUNTS MV E velocity: 78.40 cm/s  Systemic VTI:  0.21 m MV A velocity: 45.80 cm/s  Systemic Diam: 2.00 cm MV E/A ratio:  1.71 Eleonore Chiquito MD Electronically signed by Eleonore Chiquito MD Signature Date/Time: 07/28/2022/10:53:14 AM    Final    US Abdomen Limited RUQ (LIVER/GB)  Result Date: 07/19/2022 CLINICAL DATA:  Right upper quadrant pain EXAM: ULTRASOUND ABDOMEN LIMITED RIGHT UPPER QUADRANT COMPARISON:  CT 07/19/2022 FINDINGS: Gallbladder: Positive for gallstones. Increased wall thickness measuring 4 mm. Pericholecystic fluid. Negative sonographic Murphy. Common bile duct: Diameter: 3 mm Liver: No focal lesion identified. Within normal limits in parenchymal echogenicity. Portal vein is patent on color Doppler imaging with normal direction of blood flow towards the liver. Other: None. IMPRESSION: 1. Cholelithiasis with diffuse gallbladder wall thickening  and pericholecystic fluid, but negative sonographic Percell Miller, findings are indeterminate for acute cholecystitis. Consider correlation with nuclear medicine hepatobiliary imaging.  Electronically Signed   By: Donavan Foil M.D.   On: 07/19/2022 15:31   CT Renal Stone Study  Result Date: 07/19/2022 CLINICAL DATA:  Abdominal and flank pain. EXAM: CT ABDOMEN AND PELVIS WITHOUT CONTRAST TECHNIQUE: Multidetector CT imaging of the abdomen and pelvis was performed following the standard protocol without IV contrast. RADIATION DOSE REDUCTION: This exam was performed according to the departmental dose-optimization program which includes automated exposure control, adjustment of the mA and/or kV according to patient size and/or use of iterative reconstruction technique. COMPARISON:  CTA abdomen pelvis 12/15/2015 FINDINGS: Lower chest: Similar appearance of chronic atelectasis or scarring in the posterior right lung base. Hepatobiliary: No suspicious focal abnormality in the liver on this study without intravenous contrast. Attenuation of liver parenchyma appears mildly increased which may be related to amiodarone therapy or iron deposition disease. Tiny gallstones are seen in the gallbladder lumen with subtle pericholecystic fluid towards the fundus. No intrahepatic or extrahepatic biliary dilation. Pancreas: No focal mass lesion. No dilatation of the main duct. No intraparenchymal cyst. No peripancreatic edema. Spleen: Splenomegaly. Spleen measures 19.6 x 16.4 x 13.6 cm with estimated volume of 2566 cc. Adrenals/Urinary Tract: No adrenal nodule or mass. Kidneys unremarkable. No evidence for hydroureter. Bladder has a somewhat irregular contour anteriorly without appreciable bladder wall thickening. Stomach/Bowel: Stomach is unremarkable. No gastric wall thickening. No evidence of outlet obstruction. Duodenum is normally positioned as is the ligament of Treitz. No small bowel wall thickening. No small bowel dilatation. The terminal ileum is normal. The appendix is normal. No gross colonic mass. No colonic wall thickening. There is fluid throughout the colon extending down to the distal rectum,  imaging features compatible with clinical diarrhea. Vascular/Lymphatic: There is mild atherosclerotic calcification of the abdominal aorta without aneurysm. Marked prominence of the portal vein and splenic vein suggests portal venous hypertension. There is no gastrohepatic or hepatoduodenal ligament lymphadenopathy. No retroperitoneal or mesenteric lymphadenopathy. No pelvic sidewall lymphadenopathy. Reproductive: Prostate gland is enlarged with central defect suggesting previous TURP. Other: Trace free fluid is seen adjacent to the liver and in the right paracolic gutter. Musculoskeletal: No worrisome lytic or sclerotic osseous abnormality. IMPRESSION: 1. Fluid throughout the colon extending down to the distal rectum, imaging features compatible with clinical diarrhea. No colonic wall thickening or pericolonic edema. 2. Cholelithiasis with subtle pericholecystic fluid towards the fundus. If there is clinical concern for acute cholecystitis, right upper quadrant ultrasound could be used to further evaluate. 3. Splenomegaly with estimated volume of 2566 cc. Marked prominence of the portal vein and splenic. Findings suggest portal venous hypertension. 4. Trace free fluid adjacent to the liver and in the right paracolic gutter. 5. Prostatomegaly with central defect suggesting previous TURP. 6. Attenuation of liver parenchyma appears mildly increased which can be related to amiodarone therapy or iron deposition disease. 7.  Aortic Atherosclerosis (ICD10-I70.0). Electronically Signed   By: Misty Stanley M.D.   On: 07/19/2022 13:06   DG Chest 2 View  Result Date: 07/19/2022 CLINICAL DATA:  Suspected sepsis. EXAM: CHEST - 2 VIEW COMPARISON:  01/01/2016 FINDINGS: The lungs are clear without focal pneumonia, edema, pneumothorax or pleural effusion. Left PICC line tip overlies the proximal SVC level. The lungs are clear without focal pneumonia, edema, pneumothorax or pleural effusion. The cardiopericardial silhouette is  within normal limits for size. Telemetry leads overlie the chest. IMPRESSION: No active cardiopulmonary disease. Electronically  Signed   By: Misty Stanley M.D.   On: 07/19/2022 09:43    Microbiology: Recent Results (from the past 240 hour(s))  MRSA Next Gen by PCR, Nasal     Status: None   Collection Time: 07/20/22 10:08 PM   Specimen: Nasal Mucosa; Nasal Swab  Result Value Ref Range Status   MRSA by PCR Next Gen NOT DETECTED NOT DETECTED Final    Comment: (NOTE) The GeneXpert MRSA Assay (FDA approved for NASAL specimens only), is one component of a comprehensive MRSA colonization surveillance program. It is not intended to diagnose MRSA infection nor to guide or monitor treatment for MRSA infections. Test performance is not FDA approved in patients less than 82 years old. Performed at [facility], [place] [address] [place], [place] [address]   Culture, blood (Routine X 2) w Reflex to ID Panel     Status: None   Collection Time: 07/23/22  8:40 AM   Specimen: BLOOD RIGHT ARM  Result Value Ref Range Status   Specimen Description   Final    BLOOD RIGHT ARM Performed at [facility] [address] [place], [place] [address]    Special Requests   Final    BOTTLES DRAWN AEROBIC ONLY Blood Culture results may not be optimal due to an inadequate volume of blood received in culture bottles Performed at [facility] [address], [place], [place] [address]    Culture   Final    NO GROWTH 5 DAYS Performed at [facility], [place] [address], [place], [place] [address]    Report Status 07/28/2022 FINAL  Final  Culture, blood (Routine X 2) w Reflex to ID Panel     Status: None   Collection Time: 07/23/22  8:40 AM   Specimen: BLOOD RIGHT HAND  Result Value Ref Range Status   Specimen Description   Final    BLOOD RIGHT HAND Performed at [facility] [address], [place], [place] [address]    Special  Requests   Final    BOTTLES DRAWN AEROBIC ONLY Blood Culture adequate volume Performed at [facility] [address], [place], [place] [address]    Culture   Final    NO GROWTH 5 DAYS Performed at [facility], [place] [address] [place], [place] [address]    Report Status 07/28/2022 FINAL  Final  Urine Culture     Status: None   Collection Time: 07/25/22 12:37 PM   Specimen: Urine, Clean Catch  Result Value Ref Range Status   Specimen Description   Final    URINE, CLEAN CATCH Performed at [facility], [place] [address] [place], [place] [address]    Special Requests   Final    NONE Performed at [facility], [place] [address], [place], [place] [address]    Culture   Final    NO GROWTH Performed at [facility], [place] [address] [place], [place] [address]    Report Status 07/26/2022 FINAL  Final     Labs: Basic Metabolic Panel: Recent Labs  Lab 07/23/22 0306 07/24/22 0240 07/25/22 0302 07/26/22 0035 07/27/22 0208 07/28/22 0242 07/29/22 0409  NA 131* 129* 130* 130* 126* 129* 128*  K 3.8 3.9 3.8 3.8 3.4* 4.0 3.7  CL 102 101 102 101 97* 98 96*  CO2 21* 21* '22 22 23 24 25  '$ GLUCOSE 122* 130* 129* 132* 126* 118* 120*  BUN 28* 27* 20 27* 31* 27* 25*  CREATININE 0.94 0.99 0.98 0.95 1.05 0.98 0.94  CALCIUM 7.6* 7.5* 7.3* 7.3* 7.1* 7.2* 7.3*  MG 2.0 1.8 2.0  --   --   --   --    Liver Function Tests: Recent Labs  Lab 07/25/22 0302 07/26/22 0035 07/27/22 0208 07/28/22 0242 07/29/22 0409  AST '25 23 26 31 '$ 35  ALT 55* 46* 39 37 38  ALKPHOS 168* 149* 126 147* 197*  BILITOT 1.1 1.0 1.0 1.2 1.7*  PROT 5.1* 5.6* 5.2* 5.2* 4.8*  ALBUMIN 2.2* 2.2* 2.0* 1.9* 1.9*   No results for input(s): "LIPASE", "AMYLASE" in the last 168 hours. No results for input(s): "AMMONIA" in the last 168 hours. CBC: Recent Labs  Lab 07/26/22 0035 07/26/22 2229 07/27/22 0208 07/28/22 0242 07/29/22 0006 07/29/22 0409  WBC 0.2*  0.3* 0.2* 0.2* 0.2* 0.2*  NEUTROABS 0.0*  --  0.0* 0.0* 0.0* 0.0*  HGB 6.9* 8.3* 6.9* 6.8* 8.0* 7.8*  HCT 21.2* 24.4* 20.5* 20.0* 24.1* 23.0*  MCV 84.8 83.3 83.3 83.7 83.4 84.2  PLT 5* 8* 7* 5* 7* 7*   Cardiac Enzymes: No results for input(s): "CKTOTAL", "CKMB", "CKMBINDEX", "TROPONINI" in the last 168 hours. BNP: BNP (last 3 results) No results for input(s): "BNP" in the last 8760 hours.  ProBNP (last 3 results) No results for input(s): "PROBNP" in the last 8760 hours.  CBG: No results for input(s): "GLUCAP" in the last 168 hours.     Signed:  Irine Seal MD.  [facility] 07/29/2022, 3:27 PM

## 2022-07-29 NOTE — Progress Notes (Signed)
Mr. Maya had a busy day yesterday.  He was given a unit of blood.  He was given a unit of platelets.  He has had no fever.  He is off IV antibiotics now.  He is on empiric Levaquin.  His labs today show white cell count 200.  Hemoglobin 7.8.  Platelet count 7000.  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  His electrolytes show sodium 128.  Potassium is 3.7.  BUN 25 creatinine 0.94.  His calcium is 7.3 with an albumin of 1.9.  He has had no bleeding.  He is on Amicar.  He has had no fever.  All cultures have been negative.  He has had no rashes.  He does have some ecchymoses.  There is been no cough or shortness of breath.  He did have an echocardiogram done yesterday.  This showed an ejection fraction of 50-55%.  There is no obvious valvular abnormalities.  His appetite is okay.  There is been no nausea or vomiting.  He has had no problems with diarrhea.  I think but does need some probiotic.  We do have him on some Procrit.  Hopefully, this may help with the anemia.  At this point, I really think that he could be discharged.  I just do not expect that his blood counts will improve much.  He sees his physicians out at [facility] Monday and Thursday.  His lab work done.  He would like to go home.  He is up.  He is walking.  From my point of view, I do not see a problem with him going home.  He does need to have Levaquin at home.  He will need the Amicar.  I know that he has had incredible care from everybody on [address].  I really appreciate all of the compassion that the staff has shown.  Lattie Haw, MD  Oswaldo Milian 7:14

## 2022-07-29 NOTE — Progress Notes (Signed)
Mobility Specialist - Progress Note   07/29/22 1156  Mobility  Activity Stood at bedside  Level of Assistance Standby assist, set-up cues, supervision of patient - no hands on  Assistive Device Front wheel walker  Distance Ambulated (ft) 0 ft  Activity Response Tolerated well  Mobility Referral Yes  $Mobility charge 1 Mobility   Pt received in recliner needing assistance w/ clean-up from BM in recliner. No complaints during clean up. Assisted pt w/ ADLs as well. Pt reported being too tired to ambulate, so will return in the afternoon. Pt left in recliner w/ all needs met & call bell in reach.  [facility]

## 2022-07-29 NOTE — Progress Notes (Signed)
Mobility Specialist - Progress Note   07/29/22 1515  Mobility  Activity Ambulated with assistance in hallway  Level of Assistance Standby assist, set-up cues, supervision of patient - no hands on  Assistive Device Front wheel walker  Distance Ambulated (ft) 260 ft  Activity Response Tolerated well  Mobility Referral Yes  $Mobility charge 1 Mobility   Pt received in recliner and agreeable to mobility. No complaints during mobility. Pt to recliner after session with all needs met & family in room.   [facility]

## 2022-07-29 NOTE — Social Work (Signed)
CSW got [place] for patient,'s [facility] PT/ OT  TOC at this time has no other concerns for the patients DC. TOC will continue to follow at this time.

## 2022-07-30 ENCOUNTER — Other Ambulatory Visit (HOSPITAL_COMMUNITY): Payer: Self-pay

## 2022-07-31 ENCOUNTER — Other Ambulatory Visit (HOSPITAL_COMMUNITY): Payer: Self-pay

## 2022-08-03 ENCOUNTER — Inpatient Hospital Stay: Payer: Medicare PPO | Attending: Hematology & Oncology

## 2022-08-03 ENCOUNTER — Other Ambulatory Visit: Payer: Self-pay

## 2022-08-03 ENCOUNTER — Telehealth: Payer: Self-pay

## 2022-08-03 ENCOUNTER — Other Ambulatory Visit: Payer: Self-pay | Admitting: Family

## 2022-08-03 DIAGNOSIS — C9200 Acute myeloblastic leukemia, not having achieved remission: Secondary | ICD-10-CM

## 2022-08-03 LAB — CMP (CANCER CENTER ONLY)
ALT: 48 U/L — ABNORMAL HIGH (ref 0–44)
AST: 33 U/L (ref 15–41)
Albumin: 2.8 g/dL — ABNORMAL LOW (ref 3.5–5.0)
Alkaline Phosphatase: 221 U/L — ABNORMAL HIGH (ref 38–126)
Anion gap: 6 (ref 5–15)
BUN: 20 mg/dL (ref 8–23)
CO2: 28 mmol/L (ref 22–32)
Calcium: 8.2 mg/dL — ABNORMAL LOW (ref 8.9–10.3)
Chloride: 95 mmol/L — ABNORMAL LOW (ref 98–111)
Creatinine: 0.73 mg/dL (ref 0.61–1.24)
GFR, Estimated: >60 mL/min (ref >60)
Glucose, Bld: 127 mg/dL — ABNORMAL HIGH (ref 70–99)
Potassium: 4.5 mmol/L (ref 3.5–5.1)
Sodium: 129 mmol/L — ABNORMAL LOW (ref 135–145)
Total Bilirubin: 1.4 mg/dL — ABNORMAL HIGH (ref 0.3–1.2)
Total Protein: 5.7 g/dL — ABNORMAL LOW (ref 6.5–8.1)

## 2022-08-03 LAB — CBC WITH DIFFERENTIAL (CANCER CENTER ONLY)
Abs Immature Granulocytes: 0.01 10*3/uL (ref 0.00–0.07)
Basophils Absolute: 0 10*3/uL (ref 0.0–0.1)
Basophils Relative: 0 %
Eosinophils Absolute: 0 10*3/uL (ref 0.0–0.5)
Eosinophils Relative: 0 %
HCT: 22.6 % — ABNORMAL LOW (ref 39.0–52.0)
Hemoglobin: 7.5 g/dL — ABNORMAL LOW (ref 13.0–17.0)
Immature Granulocytes: 5 %
Lymphocytes Relative: 54 %
Lymphs Abs: 0.1 10*3/uL — ABNORMAL LOW (ref 0.7–4.0)
MCH: 27.7 pg (ref 26.0–34.0)
MCHC: 33.2 g/dL (ref 30.0–36.0)
MCV: 83.4 fL (ref 80.0–100.0)
Monocytes Absolute: 0.1 10*3/uL (ref 0.1–1.0)
Monocytes Relative: 27 %
Neutro Abs: 0 10*3/uL — CL (ref 1.7–7.7)
Neutrophils Relative %: 14 %
Platelet Count: <5 10*3/uL — CL (ref 150–400)
RBC: 2.71 MIL/uL — ABNORMAL LOW (ref 4.22–5.81)
RDW: 14.2 % (ref 11.5–15.5)
Smear Review: NORMAL
WBC Count: 0.2 10*3/uL — CL (ref 4.0–10.5)
nRBC: 0 % (ref 0.0–0.2)

## 2022-08-03 LAB — SAMPLE TO BLOOD BANK

## 2022-08-03 NOTE — Telephone Encounter (Signed)
Dr Marin Olp notified of critical labs. WBC - 0.2 ANC - 0.0 Platelets - less than 5.   Patient to receive platelets tomorrow per Dr Marin Olp. dph

## 2022-08-04 ENCOUNTER — Inpatient Hospital Stay: Payer: Medicare PPO

## 2022-08-04 ENCOUNTER — Telehealth: Payer: Self-pay

## 2022-08-04 DIAGNOSIS — C9200 Acute myeloblastic leukemia, not having achieved remission: Secondary | ICD-10-CM

## 2022-08-04 MED ORDER — HEPARIN SOD (PORK) LOCK FLUSH 100 UNIT/ML IV SOLN
250.0000 [IU] | INTRAVENOUS | Status: AC | PRN
  Administered 2022-08-04: 250 [IU]

## 2022-08-04 MED ORDER — SODIUM CHLORIDE 0.9% FLUSH
3.0000 mL | INTRAVENOUS | Status: AC | PRN
  Administered 2022-08-04: 3 mL

## 2022-08-04 MED ORDER — SODIUM CHLORIDE 0.9% IV SOLUTION
250.0000 mL | Freq: Once | INTRAVENOUS | Status: AC
  Administered 2022-08-04: 250 mL via INTRAVENOUS

## 2022-08-04 NOTE — Telephone Encounter (Signed)
Called patient to find out why he didn't come for his 09:30 appointment. He stated that he was not aware of it, and that he can come today at 13:00.

## 2022-08-05 LAB — PREPARE PLATELET PHERESIS: Unit division: 0

## 2022-08-05 LAB — BPAM PLATELET PHERESIS
Blood Product Expiration Date: 202312122359
ISSUE DATE / TIME: 202312120907
Unit Type and Rh: 5100

## 2022-08-06 DIAGNOSIS — E871 Hypo-osmolality and hyponatremia: Secondary | ICD-10-CM | POA: Diagnosis not present

## 2022-08-06 DIAGNOSIS — C9200 Acute myeloblastic leukemia, not having achieved remission: Secondary | ICD-10-CM | POA: Diagnosis not present

## 2022-08-06 DIAGNOSIS — D471 Chronic myeloproliferative disease: Secondary | ICD-10-CM | POA: Diagnosis not present

## 2022-08-06 DIAGNOSIS — I272 Pulmonary hypertension, unspecified: Secondary | ICD-10-CM | POA: Diagnosis not present

## 2022-08-06 DIAGNOSIS — D61818 Other pancytopenia: Secondary | ICD-10-CM | POA: Diagnosis not present

## 2022-08-06 DIAGNOSIS — R197 Diarrhea, unspecified: Secondary | ICD-10-CM | POA: Diagnosis not present

## 2022-08-06 DIAGNOSIS — R600 Localized edema: Secondary | ICD-10-CM | POA: Diagnosis not present

## 2022-08-06 DIAGNOSIS — D45 Polycythemia vera: Secondary | ICD-10-CM | POA: Diagnosis not present

## 2022-08-06 DIAGNOSIS — R109 Unspecified abdominal pain: Secondary | ICD-10-CM | POA: Diagnosis not present

## 2022-08-06 DIAGNOSIS — Z959 Presence of cardiac and vascular implant and graft, unspecified: Secondary | ICD-10-CM | POA: Diagnosis not present

## 2022-08-06 DIAGNOSIS — K7460 Unspecified cirrhosis of liver: Secondary | ICD-10-CM | POA: Diagnosis not present

## 2022-08-06 DIAGNOSIS — N179 Acute kidney failure, unspecified: Secondary | ICD-10-CM | POA: Diagnosis not present

## 2022-08-06 DIAGNOSIS — D709 Neutropenia, unspecified: Secondary | ICD-10-CM | POA: Diagnosis not present

## 2022-08-08 ENCOUNTER — Encounter: Payer: Self-pay | Admitting: Family

## 2022-08-08 ENCOUNTER — Other Ambulatory Visit (HOSPITAL_COMMUNITY): Payer: Self-pay

## 2022-08-10 DIAGNOSIS — Z66 Do not resuscitate: Secondary | ICD-10-CM | POA: Diagnosis not present

## 2022-08-10 DIAGNOSIS — J101 Influenza due to other identified influenza virus with other respiratory manifestations: Secondary | ICD-10-CM | POA: Diagnosis not present

## 2022-08-10 DIAGNOSIS — K7460 Unspecified cirrhosis of liver: Secondary | ICD-10-CM | POA: Diagnosis not present

## 2022-08-10 DIAGNOSIS — K8020 Calculus of gallbladder without cholecystitis without obstruction: Secondary | ICD-10-CM | POA: Diagnosis not present

## 2022-08-10 DIAGNOSIS — J111 Influenza due to unidentified influenza virus with other respiratory manifestations: Secondary | ICD-10-CM | POA: Diagnosis not present

## 2022-08-10 DIAGNOSIS — M7989 Other specified soft tissue disorders: Secondary | ICD-10-CM | POA: Diagnosis not present

## 2022-08-10 DIAGNOSIS — E871 Hypo-osmolality and hyponatremia: Secondary | ICD-10-CM | POA: Diagnosis not present

## 2022-08-10 DIAGNOSIS — R6339 Other feeding difficulties: Secondary | ICD-10-CM | POA: Diagnosis not present

## 2022-08-10 DIAGNOSIS — D471 Chronic myeloproliferative disease: Secondary | ICD-10-CM | POA: Diagnosis not present

## 2022-08-10 DIAGNOSIS — C9202 Acute myeloblastic leukemia, in relapse: Secondary | ICD-10-CM | POA: Diagnosis not present

## 2022-08-10 DIAGNOSIS — R5081 Fever presenting with conditions classified elsewhere: Secondary | ICD-10-CM | POA: Diagnosis not present

## 2022-08-10 DIAGNOSIS — I4510 Unspecified right bundle-branch block: Secondary | ICD-10-CM | POA: Diagnosis not present

## 2022-08-10 DIAGNOSIS — R1319 Other dysphagia: Secondary | ICD-10-CM | POA: Diagnosis not present

## 2022-08-10 DIAGNOSIS — J9601 Acute respiratory failure with hypoxia: Secondary | ICD-10-CM | POA: Diagnosis not present

## 2022-08-10 DIAGNOSIS — J09X2 Influenza due to identified novel influenza A virus with other respiratory manifestations: Secondary | ICD-10-CM | POA: Diagnosis not present

## 2022-08-10 DIAGNOSIS — R600 Localized edema: Secondary | ICD-10-CM | POA: Diagnosis not present

## 2022-08-10 DIAGNOSIS — R627 Adult failure to thrive: Secondary | ICD-10-CM | POA: Diagnosis not present

## 2022-08-10 DIAGNOSIS — D709 Neutropenia, unspecified: Secondary | ICD-10-CM | POA: Diagnosis not present

## 2022-08-10 DIAGNOSIS — D45 Polycythemia vera: Secondary | ICD-10-CM | POA: Diagnosis not present

## 2022-08-10 DIAGNOSIS — J810 Acute pulmonary edema: Secondary | ICD-10-CM | POA: Diagnosis not present

## 2022-08-10 DIAGNOSIS — E8770 Fluid overload, unspecified: Secondary | ICD-10-CM | POA: Diagnosis not present

## 2022-08-10 DIAGNOSIS — D61810 Antineoplastic chemotherapy induced pancytopenia: Secondary | ICD-10-CM | POA: Diagnosis not present

## 2022-08-10 DIAGNOSIS — C9200 Acute myeloblastic leukemia, not having achieved remission: Secondary | ICD-10-CM | POA: Diagnosis not present

## 2022-08-10 DIAGNOSIS — R509 Fever, unspecified: Secondary | ICD-10-CM | POA: Diagnosis not present

## 2022-08-10 DIAGNOSIS — D61818 Other pancytopenia: Secondary | ICD-10-CM | POA: Diagnosis not present

## 2022-08-10 DIAGNOSIS — J09X9 Influenza due to identified novel influenza A virus with other manifestations: Secondary | ICD-10-CM | POA: Diagnosis not present

## 2022-08-10 DIAGNOSIS — A0811 Acute gastroenteropathy due to Norwalk agent: Secondary | ICD-10-CM | POA: Diagnosis not present

## 2022-08-10 DIAGNOSIS — L989 Disorder of the skin and subcutaneous tissue, unspecified: Secondary | ICD-10-CM | POA: Diagnosis not present

## 2022-08-10 DIAGNOSIS — R918 Other nonspecific abnormal finding of lung field: Secondary | ICD-10-CM | POA: Diagnosis not present

## 2022-08-10 DIAGNOSIS — Z20822 Contact with and (suspected) exposure to covid-19: Secondary | ICD-10-CM | POA: Diagnosis not present

## 2022-08-10 DIAGNOSIS — Z8619 Personal history of other infectious and parasitic diseases: Secondary | ICD-10-CM | POA: Diagnosis not present

## 2022-08-10 DIAGNOSIS — J90 Pleural effusion, not elsewhere classified: Secondary | ICD-10-CM | POA: Diagnosis not present

## 2022-08-10 DIAGNOSIS — I472 Ventricular tachycardia, unspecified: Secondary | ICD-10-CM | POA: Diagnosis not present

## 2022-08-10 DIAGNOSIS — D696 Thrombocytopenia, unspecified: Secondary | ICD-10-CM | POA: Diagnosis not present

## 2022-08-12 ENCOUNTER — Other Ambulatory Visit (HOSPITAL_COMMUNITY): Payer: Self-pay

## 2022-08-31 DIAGNOSIS — I272 Pulmonary hypertension, unspecified: Secondary | ICD-10-CM | POA: Diagnosis not present

## 2022-08-31 DIAGNOSIS — D61818 Other pancytopenia: Secondary | ICD-10-CM | POA: Diagnosis not present

## 2022-08-31 DIAGNOSIS — R600 Localized edema: Secondary | ICD-10-CM | POA: Diagnosis not present

## 2022-08-31 DIAGNOSIS — R5383 Other fatigue: Secondary | ICD-10-CM | POA: Diagnosis not present

## 2022-08-31 DIAGNOSIS — Z955 Presence of coronary angioplasty implant and graft: Secondary | ICD-10-CM | POA: Diagnosis not present

## 2022-08-31 DIAGNOSIS — K219 Gastro-esophageal reflux disease without esophagitis: Secondary | ICD-10-CM | POA: Diagnosis not present

## 2022-08-31 DIAGNOSIS — K7460 Unspecified cirrhosis of liver: Secondary | ICD-10-CM | POA: Diagnosis not present

## 2022-08-31 DIAGNOSIS — R531 Weakness: Secondary | ICD-10-CM | POA: Diagnosis not present

## 2022-08-31 DIAGNOSIS — Z86718 Personal history of other venous thrombosis and embolism: Secondary | ICD-10-CM | POA: Diagnosis not present

## 2022-08-31 DIAGNOSIS — C9200 Acute myeloblastic leukemia, not having achieved remission: Secondary | ICD-10-CM | POA: Diagnosis not present

## 2022-08-31 DIAGNOSIS — D45 Polycythemia vera: Secondary | ICD-10-CM | POA: Diagnosis not present

## 2022-08-31 DIAGNOSIS — D7581 Myelofibrosis: Secondary | ICD-10-CM | POA: Diagnosis not present

## 2022-09-03 DIAGNOSIS — R600 Localized edema: Secondary | ICD-10-CM | POA: Diagnosis not present

## 2022-09-03 DIAGNOSIS — K7460 Unspecified cirrhosis of liver: Secondary | ICD-10-CM | POA: Diagnosis not present

## 2022-09-03 DIAGNOSIS — C9200 Acute myeloblastic leukemia, not having achieved remission: Secondary | ICD-10-CM | POA: Diagnosis not present

## 2022-09-03 DIAGNOSIS — D471 Chronic myeloproliferative disease: Secondary | ICD-10-CM | POA: Diagnosis not present

## 2022-09-03 DIAGNOSIS — J90 Pleural effusion, not elsewhere classified: Secondary | ICD-10-CM | POA: Diagnosis not present

## 2022-09-03 DIAGNOSIS — D709 Neutropenia, unspecified: Secondary | ICD-10-CM | POA: Diagnosis not present

## 2022-09-03 DIAGNOSIS — D630 Anemia in neoplastic disease: Secondary | ICD-10-CM | POA: Diagnosis not present

## 2022-09-03 DIAGNOSIS — R197 Diarrhea, unspecified: Secondary | ICD-10-CM | POA: Diagnosis not present

## 2022-09-03 DIAGNOSIS — D61818 Other pancytopenia: Secondary | ICD-10-CM | POA: Diagnosis not present

## 2022-09-03 DIAGNOSIS — Q8509 Other neurofibromatosis: Secondary | ICD-10-CM | POA: Diagnosis not present

## 2022-09-03 DIAGNOSIS — J9601 Acute respiratory failure with hypoxia: Secondary | ICD-10-CM | POA: Diagnosis not present

## 2022-09-03 DIAGNOSIS — Z9981 Dependence on supplemental oxygen: Secondary | ICD-10-CM | POA: Diagnosis not present

## 2022-09-03 DIAGNOSIS — I272 Pulmonary hypertension, unspecified: Secondary | ICD-10-CM | POA: Diagnosis not present

## 2022-09-05 DIAGNOSIS — C9200 Acute myeloblastic leukemia, not having achieved remission: Secondary | ICD-10-CM | POA: Diagnosis not present

## 2022-09-05 DIAGNOSIS — D45 Polycythemia vera: Secondary | ICD-10-CM | POA: Diagnosis not present

## 2022-09-05 DIAGNOSIS — Z95828 Presence of other vascular implants and grafts: Secondary | ICD-10-CM | POA: Diagnosis not present

## 2022-09-08 DIAGNOSIS — Z79899 Other long term (current) drug therapy: Secondary | ICD-10-CM | POA: Diagnosis not present

## 2022-09-08 DIAGNOSIS — Z95828 Presence of other vascular implants and grafts: Secondary | ICD-10-CM | POA: Diagnosis not present

## 2022-09-08 DIAGNOSIS — D45 Polycythemia vera: Secondary | ICD-10-CM | POA: Diagnosis not present

## 2022-09-08 DIAGNOSIS — C9200 Acute myeloblastic leukemia, not having achieved remission: Secondary | ICD-10-CM | POA: Diagnosis not present

## 2022-09-11 DIAGNOSIS — C9200 Acute myeloblastic leukemia, not having achieved remission: Secondary | ICD-10-CM | POA: Diagnosis not present

## 2022-09-11 DIAGNOSIS — J90 Pleural effusion, not elsewhere classified: Secondary | ICD-10-CM | POA: Diagnosis not present

## 2022-09-11 DIAGNOSIS — D45 Polycythemia vera: Secondary | ICD-10-CM | POA: Diagnosis not present

## 2022-09-14 DIAGNOSIS — Z955 Presence of coronary angioplasty implant and graft: Secondary | ICD-10-CM | POA: Diagnosis not present

## 2022-09-14 DIAGNOSIS — I272 Pulmonary hypertension, unspecified: Secondary | ICD-10-CM | POA: Diagnosis not present

## 2022-09-14 DIAGNOSIS — D630 Anemia in neoplastic disease: Secondary | ICD-10-CM | POA: Diagnosis not present

## 2022-09-14 DIAGNOSIS — C9200 Acute myeloblastic leukemia, not having achieved remission: Secondary | ICD-10-CM | POA: Diagnosis not present

## 2022-09-14 DIAGNOSIS — J9601 Acute respiratory failure with hypoxia: Secondary | ICD-10-CM | POA: Diagnosis not present

## 2022-09-14 DIAGNOSIS — Q8500 Neurofibromatosis, unspecified: Secondary | ICD-10-CM | POA: Diagnosis not present

## 2022-09-14 DIAGNOSIS — R600 Localized edema: Secondary | ICD-10-CM | POA: Diagnosis not present

## 2022-09-14 DIAGNOSIS — D61818 Other pancytopenia: Secondary | ICD-10-CM | POA: Diagnosis not present

## 2022-09-14 DIAGNOSIS — Z86718 Personal history of other venous thrombosis and embolism: Secondary | ICD-10-CM | POA: Diagnosis not present

## 2022-09-14 DIAGNOSIS — A0811 Acute gastroenteropathy due to Norwalk agent: Secondary | ICD-10-CM | POA: Diagnosis not present

## 2022-09-14 DIAGNOSIS — D709 Neutropenia, unspecified: Secondary | ICD-10-CM | POA: Diagnosis not present

## 2022-09-14 DIAGNOSIS — D45 Polycythemia vera: Secondary | ICD-10-CM | POA: Diagnosis not present

## 2022-09-14 DIAGNOSIS — R197 Diarrhea, unspecified: Secondary | ICD-10-CM | POA: Diagnosis not present

## 2022-09-17 DIAGNOSIS — Z79899 Other long term (current) drug therapy: Secondary | ICD-10-CM | POA: Diagnosis not present

## 2022-09-17 DIAGNOSIS — C9200 Acute myeloblastic leukemia, not having achieved remission: Secondary | ICD-10-CM | POA: Diagnosis not present

## 2022-09-21 DIAGNOSIS — R7989 Other specified abnormal findings of blood chemistry: Secondary | ICD-10-CM | POA: Diagnosis not present

## 2022-09-21 DIAGNOSIS — C9200 Acute myeloblastic leukemia, not having achieved remission: Secondary | ICD-10-CM | POA: Diagnosis not present

## 2022-09-24 DIAGNOSIS — D45 Polycythemia vera: Secondary | ICD-10-CM | POA: Diagnosis not present

## 2022-09-24 DIAGNOSIS — C9200 Acute myeloblastic leukemia, not having achieved remission: Secondary | ICD-10-CM | POA: Diagnosis not present

## 2022-09-24 DIAGNOSIS — R7989 Other specified abnormal findings of blood chemistry: Secondary | ICD-10-CM | POA: Diagnosis not present

## 2022-09-28 DIAGNOSIS — D709 Neutropenia, unspecified: Secondary | ICD-10-CM | POA: Diagnosis not present

## 2022-09-28 DIAGNOSIS — I272 Pulmonary hypertension, unspecified: Secondary | ICD-10-CM | POA: Diagnosis not present

## 2022-09-28 DIAGNOSIS — Z955 Presence of coronary angioplasty implant and graft: Secondary | ICD-10-CM | POA: Diagnosis not present

## 2022-09-28 DIAGNOSIS — N179 Acute kidney failure, unspecified: Secondary | ICD-10-CM | POA: Diagnosis not present

## 2022-09-28 DIAGNOSIS — D61818 Other pancytopenia: Secondary | ICD-10-CM | POA: Diagnosis not present

## 2022-09-28 DIAGNOSIS — R600 Localized edema: Secondary | ICD-10-CM | POA: Diagnosis not present

## 2022-09-28 DIAGNOSIS — C9200 Acute myeloblastic leukemia, not having achieved remission: Secondary | ICD-10-CM | POA: Diagnosis not present

## 2022-09-28 DIAGNOSIS — E871 Hypo-osmolality and hyponatremia: Secondary | ICD-10-CM | POA: Diagnosis not present

## 2022-09-28 DIAGNOSIS — D45 Polycythemia vera: Secondary | ICD-10-CM | POA: Diagnosis not present

## 2022-09-28 DIAGNOSIS — K7460 Unspecified cirrhosis of liver: Secondary | ICD-10-CM | POA: Diagnosis not present

## 2022-09-28 DIAGNOSIS — K219 Gastro-esophageal reflux disease without esophagitis: Secondary | ICD-10-CM | POA: Diagnosis not present

## 2022-09-28 DIAGNOSIS — J90 Pleural effusion, not elsewhere classified: Secondary | ICD-10-CM | POA: Diagnosis not present

## 2022-09-28 DIAGNOSIS — R531 Weakness: Secondary | ICD-10-CM | POA: Diagnosis not present

## 2022-10-01 DIAGNOSIS — C9200 Acute myeloblastic leukemia, not having achieved remission: Secondary | ICD-10-CM | POA: Diagnosis not present

## 2022-10-01 DIAGNOSIS — D45 Polycythemia vera: Secondary | ICD-10-CM | POA: Diagnosis not present

## 2022-10-01 DIAGNOSIS — Z79899 Other long term (current) drug therapy: Secondary | ICD-10-CM | POA: Diagnosis not present

## 2022-10-05 DIAGNOSIS — N179 Acute kidney failure, unspecified: Secondary | ICD-10-CM | POA: Diagnosis not present

## 2022-10-05 DIAGNOSIS — R7989 Other specified abnormal findings of blood chemistry: Secondary | ICD-10-CM | POA: Diagnosis not present

## 2022-10-05 DIAGNOSIS — E871 Hypo-osmolality and hyponatremia: Secondary | ICD-10-CM | POA: Diagnosis not present

## 2022-10-05 DIAGNOSIS — Z8619 Personal history of other infectious and parasitic diseases: Secondary | ICD-10-CM | POA: Diagnosis not present

## 2022-10-05 DIAGNOSIS — L565 Disseminated superficial actinic porokeratosis (DSAP): Secondary | ICD-10-CM | POA: Diagnosis not present

## 2022-10-05 DIAGNOSIS — E861 Hypovolemia: Secondary | ICD-10-CM | POA: Diagnosis not present

## 2022-10-05 DIAGNOSIS — C9200 Acute myeloblastic leukemia, not having achieved remission: Secondary | ICD-10-CM | POA: Diagnosis not present

## 2022-10-05 DIAGNOSIS — Z87898 Personal history of other specified conditions: Secondary | ICD-10-CM | POA: Diagnosis not present

## 2022-10-05 DIAGNOSIS — J9601 Acute respiratory failure with hypoxia: Secondary | ICD-10-CM | POA: Diagnosis not present

## 2022-10-08 DIAGNOSIS — C9200 Acute myeloblastic leukemia, not having achieved remission: Secondary | ICD-10-CM | POA: Diagnosis not present

## 2022-10-08 DIAGNOSIS — D45 Polycythemia vera: Secondary | ICD-10-CM | POA: Diagnosis not present

## 2022-10-08 DIAGNOSIS — Z79899 Other long term (current) drug therapy: Secondary | ICD-10-CM | POA: Diagnosis not present

## 2022-10-12 DIAGNOSIS — D709 Neutropenia, unspecified: Secondary | ICD-10-CM | POA: Diagnosis not present

## 2022-10-12 DIAGNOSIS — K7460 Unspecified cirrhosis of liver: Secondary | ICD-10-CM | POA: Diagnosis not present

## 2022-10-12 DIAGNOSIS — R600 Localized edema: Secondary | ICD-10-CM | POA: Diagnosis not present

## 2022-10-12 DIAGNOSIS — N179 Acute kidney failure, unspecified: Secondary | ICD-10-CM | POA: Diagnosis not present

## 2022-10-12 DIAGNOSIS — R7989 Other specified abnormal findings of blood chemistry: Secondary | ICD-10-CM | POA: Diagnosis not present

## 2022-10-12 DIAGNOSIS — I272 Pulmonary hypertension, unspecified: Secondary | ICD-10-CM | POA: Diagnosis not present

## 2022-10-12 DIAGNOSIS — J9601 Acute respiratory failure with hypoxia: Secondary | ICD-10-CM | POA: Diagnosis not present

## 2022-10-12 DIAGNOSIS — R197 Diarrhea, unspecified: Secondary | ICD-10-CM | POA: Diagnosis not present

## 2022-10-12 DIAGNOSIS — J90 Pleural effusion, not elsewhere classified: Secondary | ICD-10-CM | POA: Diagnosis not present

## 2022-10-12 DIAGNOSIS — Z5111 Encounter for antineoplastic chemotherapy: Secondary | ICD-10-CM | POA: Diagnosis not present

## 2022-10-12 DIAGNOSIS — D61818 Other pancytopenia: Secondary | ICD-10-CM | POA: Diagnosis not present

## 2022-10-12 DIAGNOSIS — K766 Portal hypertension: Secondary | ICD-10-CM | POA: Diagnosis not present

## 2022-10-12 DIAGNOSIS — Z955 Presence of coronary angioplasty implant and graft: Secondary | ICD-10-CM | POA: Diagnosis not present

## 2022-10-12 DIAGNOSIS — C9200 Acute myeloblastic leukemia, not having achieved remission: Secondary | ICD-10-CM | POA: Diagnosis not present

## 2022-10-13 DIAGNOSIS — C9200 Acute myeloblastic leukemia, not having achieved remission: Secondary | ICD-10-CM | POA: Diagnosis not present

## 2022-10-13 DIAGNOSIS — Z5111 Encounter for antineoplastic chemotherapy: Secondary | ICD-10-CM | POA: Diagnosis not present

## 2022-10-14 DIAGNOSIS — C9200 Acute myeloblastic leukemia, not having achieved remission: Secondary | ICD-10-CM | POA: Diagnosis not present

## 2022-10-14 DIAGNOSIS — Z5111 Encounter for antineoplastic chemotherapy: Secondary | ICD-10-CM | POA: Diagnosis not present

## 2022-10-15 DIAGNOSIS — C9200 Acute myeloblastic leukemia, not having achieved remission: Secondary | ICD-10-CM | POA: Diagnosis not present

## 2022-10-15 DIAGNOSIS — D61818 Other pancytopenia: Secondary | ICD-10-CM | POA: Diagnosis not present

## 2022-10-15 DIAGNOSIS — Z5111 Encounter for antineoplastic chemotherapy: Secondary | ICD-10-CM | POA: Diagnosis not present

## 2022-10-16 DIAGNOSIS — Z5111 Encounter for antineoplastic chemotherapy: Secondary | ICD-10-CM | POA: Diagnosis not present

## 2022-10-16 DIAGNOSIS — C9200 Acute myeloblastic leukemia, not having achieved remission: Secondary | ICD-10-CM | POA: Diagnosis not present

## 2022-10-19 DIAGNOSIS — R7989 Other specified abnormal findings of blood chemistry: Secondary | ICD-10-CM | POA: Diagnosis not present

## 2022-10-19 DIAGNOSIS — C9200 Acute myeloblastic leukemia, not having achieved remission: Secondary | ICD-10-CM | POA: Diagnosis not present

## 2022-10-19 DIAGNOSIS — D45 Polycythemia vera: Secondary | ICD-10-CM | POA: Diagnosis not present

## 2022-10-22 DIAGNOSIS — C9200 Acute myeloblastic leukemia, not having achieved remission: Secondary | ICD-10-CM | POA: Diagnosis not present

## 2022-10-22 DIAGNOSIS — D45 Polycythemia vera: Secondary | ICD-10-CM | POA: Diagnosis not present

## 2022-10-26 DIAGNOSIS — D709 Neutropenia, unspecified: Secondary | ICD-10-CM | POA: Diagnosis not present

## 2022-10-26 DIAGNOSIS — J9601 Acute respiratory failure with hypoxia: Secondary | ICD-10-CM | POA: Diagnosis not present

## 2022-10-26 DIAGNOSIS — D61818 Other pancytopenia: Secondary | ICD-10-CM | POA: Diagnosis not present

## 2022-10-26 DIAGNOSIS — Z79899 Other long term (current) drug therapy: Secondary | ICD-10-CM | POA: Diagnosis not present

## 2022-10-26 DIAGNOSIS — K7460 Unspecified cirrhosis of liver: Secondary | ICD-10-CM | POA: Diagnosis not present

## 2022-10-26 DIAGNOSIS — C9000 Multiple myeloma not having achieved remission: Secondary | ICD-10-CM | POA: Diagnosis not present

## 2022-10-26 DIAGNOSIS — Z86718 Personal history of other venous thrombosis and embolism: Secondary | ICD-10-CM | POA: Diagnosis not present

## 2022-10-26 DIAGNOSIS — C9200 Acute myeloblastic leukemia, not having achieved remission: Secondary | ICD-10-CM | POA: Diagnosis not present

## 2022-10-26 DIAGNOSIS — D471 Chronic myeloproliferative disease: Secondary | ICD-10-CM | POA: Diagnosis not present

## 2022-10-29 DIAGNOSIS — Z79899 Other long term (current) drug therapy: Secondary | ICD-10-CM | POA: Diagnosis not present

## 2022-10-29 DIAGNOSIS — C9000 Multiple myeloma not having achieved remission: Secondary | ICD-10-CM | POA: Diagnosis not present

## 2022-10-29 DIAGNOSIS — D709 Neutropenia, unspecified: Secondary | ICD-10-CM | POA: Diagnosis not present

## 2022-10-29 DIAGNOSIS — D471 Chronic myeloproliferative disease: Secondary | ICD-10-CM | POA: Diagnosis not present

## 2022-10-29 DIAGNOSIS — Z86718 Personal history of other venous thrombosis and embolism: Secondary | ICD-10-CM | POA: Diagnosis not present

## 2022-10-29 DIAGNOSIS — J9601 Acute respiratory failure with hypoxia: Secondary | ICD-10-CM | POA: Diagnosis not present

## 2022-10-29 DIAGNOSIS — D61818 Other pancytopenia: Secondary | ICD-10-CM | POA: Diagnosis not present

## 2022-10-29 DIAGNOSIS — C9200 Acute myeloblastic leukemia, not having achieved remission: Secondary | ICD-10-CM | POA: Diagnosis not present

## 2022-10-29 DIAGNOSIS — K7460 Unspecified cirrhosis of liver: Secondary | ICD-10-CM | POA: Diagnosis not present

## 2022-11-02 DIAGNOSIS — C9000 Multiple myeloma not having achieved remission: Secondary | ICD-10-CM | POA: Diagnosis not present

## 2022-11-02 DIAGNOSIS — Z86718 Personal history of other venous thrombosis and embolism: Secondary | ICD-10-CM | POA: Diagnosis not present

## 2022-11-02 DIAGNOSIS — J9601 Acute respiratory failure with hypoxia: Secondary | ICD-10-CM | POA: Diagnosis not present

## 2022-11-02 DIAGNOSIS — D471 Chronic myeloproliferative disease: Secondary | ICD-10-CM | POA: Diagnosis not present

## 2022-11-02 DIAGNOSIS — K7460 Unspecified cirrhosis of liver: Secondary | ICD-10-CM | POA: Diagnosis not present

## 2022-11-02 DIAGNOSIS — Z79899 Other long term (current) drug therapy: Secondary | ICD-10-CM | POA: Diagnosis not present

## 2022-11-02 DIAGNOSIS — D709 Neutropenia, unspecified: Secondary | ICD-10-CM | POA: Diagnosis not present

## 2022-11-02 DIAGNOSIS — C9200 Acute myeloblastic leukemia, not having achieved remission: Secondary | ICD-10-CM | POA: Diagnosis not present

## 2022-11-02 DIAGNOSIS — D61818 Other pancytopenia: Secondary | ICD-10-CM | POA: Diagnosis not present

## 2022-11-05 DIAGNOSIS — D709 Neutropenia, unspecified: Secondary | ICD-10-CM | POA: Diagnosis not present

## 2022-11-05 DIAGNOSIS — D61818 Other pancytopenia: Secondary | ICD-10-CM | POA: Diagnosis not present

## 2022-11-05 DIAGNOSIS — C9200 Acute myeloblastic leukemia, not having achieved remission: Secondary | ICD-10-CM | POA: Diagnosis not present

## 2022-11-05 DIAGNOSIS — K7460 Unspecified cirrhosis of liver: Secondary | ICD-10-CM | POA: Diagnosis not present

## 2022-11-05 DIAGNOSIS — Z79899 Other long term (current) drug therapy: Secondary | ICD-10-CM | POA: Diagnosis not present

## 2022-11-05 DIAGNOSIS — D471 Chronic myeloproliferative disease: Secondary | ICD-10-CM | POA: Diagnosis not present

## 2022-11-05 DIAGNOSIS — J9601 Acute respiratory failure with hypoxia: Secondary | ICD-10-CM | POA: Diagnosis not present

## 2022-11-05 DIAGNOSIS — C9000 Multiple myeloma not having achieved remission: Secondary | ICD-10-CM | POA: Diagnosis not present

## 2022-11-05 DIAGNOSIS — Z86718 Personal history of other venous thrombosis and embolism: Secondary | ICD-10-CM | POA: Diagnosis not present

## 2022-11-09 DIAGNOSIS — Z86718 Personal history of other venous thrombosis and embolism: Secondary | ICD-10-CM | POA: Diagnosis not present

## 2022-11-09 DIAGNOSIS — C9200 Acute myeloblastic leukemia, not having achieved remission: Secondary | ICD-10-CM | POA: Diagnosis not present

## 2022-11-09 DIAGNOSIS — Z79899 Other long term (current) drug therapy: Secondary | ICD-10-CM | POA: Diagnosis not present

## 2022-11-09 DIAGNOSIS — D61818 Other pancytopenia: Secondary | ICD-10-CM | POA: Diagnosis not present

## 2022-11-09 DIAGNOSIS — D471 Chronic myeloproliferative disease: Secondary | ICD-10-CM | POA: Diagnosis not present

## 2022-11-09 DIAGNOSIS — D709 Neutropenia, unspecified: Secondary | ICD-10-CM | POA: Diagnosis not present

## 2022-11-09 DIAGNOSIS — K7460 Unspecified cirrhosis of liver: Secondary | ICD-10-CM | POA: Diagnosis not present

## 2022-11-09 DIAGNOSIS — C9000 Multiple myeloma not having achieved remission: Secondary | ICD-10-CM | POA: Diagnosis not present

## 2022-11-09 DIAGNOSIS — J9601 Acute respiratory failure with hypoxia: Secondary | ICD-10-CM | POA: Diagnosis not present

## 2022-11-12 DIAGNOSIS — D61818 Other pancytopenia: Secondary | ICD-10-CM | POA: Diagnosis not present

## 2022-11-12 DIAGNOSIS — D709 Neutropenia, unspecified: Secondary | ICD-10-CM | POA: Diagnosis not present

## 2022-11-12 DIAGNOSIS — Z86718 Personal history of other venous thrombosis and embolism: Secondary | ICD-10-CM | POA: Diagnosis not present

## 2022-11-12 DIAGNOSIS — J9601 Acute respiratory failure with hypoxia: Secondary | ICD-10-CM | POA: Diagnosis not present

## 2022-11-12 DIAGNOSIS — C9000 Multiple myeloma not having achieved remission: Secondary | ICD-10-CM | POA: Diagnosis not present

## 2022-11-12 DIAGNOSIS — D471 Chronic myeloproliferative disease: Secondary | ICD-10-CM | POA: Diagnosis not present

## 2022-11-12 DIAGNOSIS — Z79899 Other long term (current) drug therapy: Secondary | ICD-10-CM | POA: Diagnosis not present

## 2022-11-12 DIAGNOSIS — K7460 Unspecified cirrhosis of liver: Secondary | ICD-10-CM | POA: Diagnosis not present

## 2022-11-12 DIAGNOSIS — C9200 Acute myeloblastic leukemia, not having achieved remission: Secondary | ICD-10-CM | POA: Diagnosis not present

## 2022-11-16 DIAGNOSIS — D709 Neutropenia, unspecified: Secondary | ICD-10-CM | POA: Diagnosis not present

## 2022-11-16 DIAGNOSIS — Z86718 Personal history of other venous thrombosis and embolism: Secondary | ICD-10-CM | POA: Diagnosis not present

## 2022-11-16 DIAGNOSIS — C9200 Acute myeloblastic leukemia, not having achieved remission: Secondary | ICD-10-CM | POA: Diagnosis not present

## 2022-11-16 DIAGNOSIS — D471 Chronic myeloproliferative disease: Secondary | ICD-10-CM | POA: Diagnosis not present

## 2022-11-16 DIAGNOSIS — Z79899 Other long term (current) drug therapy: Secondary | ICD-10-CM | POA: Diagnosis not present

## 2022-11-16 DIAGNOSIS — J9601 Acute respiratory failure with hypoxia: Secondary | ICD-10-CM | POA: Diagnosis not present

## 2022-11-16 DIAGNOSIS — K7460 Unspecified cirrhosis of liver: Secondary | ICD-10-CM | POA: Diagnosis not present

## 2022-11-16 DIAGNOSIS — D61818 Other pancytopenia: Secondary | ICD-10-CM | POA: Diagnosis not present

## 2022-11-16 DIAGNOSIS — C9000 Multiple myeloma not having achieved remission: Secondary | ICD-10-CM | POA: Diagnosis not present

## 2022-11-19 DIAGNOSIS — Z86718 Personal history of other venous thrombosis and embolism: Secondary | ICD-10-CM | POA: Diagnosis not present

## 2022-11-19 DIAGNOSIS — D709 Neutropenia, unspecified: Secondary | ICD-10-CM | POA: Diagnosis not present

## 2022-11-19 DIAGNOSIS — Z79899 Other long term (current) drug therapy: Secondary | ICD-10-CM | POA: Diagnosis not present

## 2022-11-19 DIAGNOSIS — J9601 Acute respiratory failure with hypoxia: Secondary | ICD-10-CM | POA: Diagnosis not present

## 2022-11-19 DIAGNOSIS — C9000 Multiple myeloma not having achieved remission: Secondary | ICD-10-CM | POA: Diagnosis not present

## 2022-11-19 DIAGNOSIS — C9200 Acute myeloblastic leukemia, not having achieved remission: Secondary | ICD-10-CM | POA: Diagnosis not present

## 2022-11-19 DIAGNOSIS — K7460 Unspecified cirrhosis of liver: Secondary | ICD-10-CM | POA: Diagnosis not present

## 2022-11-19 DIAGNOSIS — D61818 Other pancytopenia: Secondary | ICD-10-CM | POA: Diagnosis not present

## 2022-11-19 DIAGNOSIS — D471 Chronic myeloproliferative disease: Secondary | ICD-10-CM | POA: Diagnosis not present

## 2022-11-23 DIAGNOSIS — Z79899 Other long term (current) drug therapy: Secondary | ICD-10-CM | POA: Diagnosis not present

## 2022-11-23 DIAGNOSIS — R5383 Other fatigue: Secondary | ICD-10-CM | POA: Diagnosis not present

## 2022-11-23 DIAGNOSIS — C9200 Acute myeloblastic leukemia, not having achieved remission: Secondary | ICD-10-CM | POA: Diagnosis not present

## 2022-11-23 DIAGNOSIS — D61818 Other pancytopenia: Secondary | ICD-10-CM | POA: Diagnosis not present

## 2022-11-23 DIAGNOSIS — T451X5A Adverse effect of antineoplastic and immunosuppressive drugs, initial encounter: Secondary | ICD-10-CM | POA: Diagnosis not present

## 2022-11-23 DIAGNOSIS — K769 Liver disease, unspecified: Secondary | ICD-10-CM | POA: Diagnosis not present

## 2022-11-23 DIAGNOSIS — Z86718 Personal history of other venous thrombosis and embolism: Secondary | ICD-10-CM | POA: Diagnosis not present

## 2022-11-23 DIAGNOSIS — Z5111 Encounter for antineoplastic chemotherapy: Secondary | ICD-10-CM | POA: Diagnosis not present

## 2022-11-26 DIAGNOSIS — D61818 Other pancytopenia: Secondary | ICD-10-CM | POA: Diagnosis not present

## 2022-11-26 DIAGNOSIS — C9200 Acute myeloblastic leukemia, not having achieved remission: Secondary | ICD-10-CM | POA: Diagnosis not present

## 2022-11-26 DIAGNOSIS — Z79899 Other long term (current) drug therapy: Secondary | ICD-10-CM | POA: Diagnosis not present

## 2022-11-26 DIAGNOSIS — Z5111 Encounter for antineoplastic chemotherapy: Secondary | ICD-10-CM | POA: Diagnosis not present

## 2022-11-30 DIAGNOSIS — Z79899 Other long term (current) drug therapy: Secondary | ICD-10-CM | POA: Diagnosis not present

## 2022-11-30 DIAGNOSIS — Z5111 Encounter for antineoplastic chemotherapy: Secondary | ICD-10-CM | POA: Diagnosis not present

## 2022-11-30 DIAGNOSIS — C9200 Acute myeloblastic leukemia, not having achieved remission: Secondary | ICD-10-CM | POA: Diagnosis not present

## 2022-11-30 DIAGNOSIS — D61818 Other pancytopenia: Secondary | ICD-10-CM | POA: Diagnosis not present

## 2022-12-03 DIAGNOSIS — C9200 Acute myeloblastic leukemia, not having achieved remission: Secondary | ICD-10-CM | POA: Diagnosis not present

## 2022-12-03 DIAGNOSIS — Z79899 Other long term (current) drug therapy: Secondary | ICD-10-CM | POA: Diagnosis not present

## 2022-12-03 DIAGNOSIS — Z5111 Encounter for antineoplastic chemotherapy: Secondary | ICD-10-CM | POA: Diagnosis not present

## 2022-12-03 DIAGNOSIS — D61818 Other pancytopenia: Secondary | ICD-10-CM | POA: Diagnosis not present

## 2022-12-07 DIAGNOSIS — C9200 Acute myeloblastic leukemia, not having achieved remission: Secondary | ICD-10-CM | POA: Diagnosis not present

## 2022-12-07 DIAGNOSIS — Z79899 Other long term (current) drug therapy: Secondary | ICD-10-CM | POA: Diagnosis not present

## 2022-12-07 DIAGNOSIS — Z5111 Encounter for antineoplastic chemotherapy: Secondary | ICD-10-CM | POA: Diagnosis not present

## 2022-12-07 DIAGNOSIS — Z8719 Personal history of other diseases of the digestive system: Secondary | ICD-10-CM | POA: Diagnosis not present

## 2022-12-07 DIAGNOSIS — D61818 Other pancytopenia: Secondary | ICD-10-CM | POA: Diagnosis not present

## 2022-12-07 DIAGNOSIS — Z86718 Personal history of other venous thrombosis and embolism: Secondary | ICD-10-CM | POA: Diagnosis not present

## 2022-12-10 DIAGNOSIS — Z79899 Other long term (current) drug therapy: Secondary | ICD-10-CM | POA: Diagnosis not present

## 2022-12-10 DIAGNOSIS — D61818 Other pancytopenia: Secondary | ICD-10-CM | POA: Diagnosis not present

## 2022-12-10 DIAGNOSIS — Z5111 Encounter for antineoplastic chemotherapy: Secondary | ICD-10-CM | POA: Diagnosis not present

## 2022-12-10 DIAGNOSIS — C9200 Acute myeloblastic leukemia, not having achieved remission: Secondary | ICD-10-CM | POA: Diagnosis not present

## 2022-12-14 DIAGNOSIS — Z5111 Encounter for antineoplastic chemotherapy: Secondary | ICD-10-CM | POA: Diagnosis not present

## 2022-12-14 DIAGNOSIS — Z79899 Other long term (current) drug therapy: Secondary | ICD-10-CM | POA: Diagnosis not present

## 2022-12-14 DIAGNOSIS — C9200 Acute myeloblastic leukemia, not having achieved remission: Secondary | ICD-10-CM | POA: Diagnosis not present

## 2022-12-14 DIAGNOSIS — D61818 Other pancytopenia: Secondary | ICD-10-CM | POA: Diagnosis not present

## 2022-12-17 DIAGNOSIS — C9200 Acute myeloblastic leukemia, not having achieved remission: Secondary | ICD-10-CM | POA: Diagnosis not present

## 2022-12-17 DIAGNOSIS — Z79899 Other long term (current) drug therapy: Secondary | ICD-10-CM | POA: Diagnosis not present

## 2022-12-17 DIAGNOSIS — Z5111 Encounter for antineoplastic chemotherapy: Secondary | ICD-10-CM | POA: Diagnosis not present

## 2022-12-17 DIAGNOSIS — D61818 Other pancytopenia: Secondary | ICD-10-CM | POA: Diagnosis not present

## 2022-12-21 DIAGNOSIS — Z5111 Encounter for antineoplastic chemotherapy: Secondary | ICD-10-CM | POA: Diagnosis not present

## 2022-12-21 DIAGNOSIS — D61818 Other pancytopenia: Secondary | ICD-10-CM | POA: Diagnosis not present

## 2022-12-21 DIAGNOSIS — C9200 Acute myeloblastic leukemia, not having achieved remission: Secondary | ICD-10-CM | POA: Diagnosis not present

## 2022-12-21 DIAGNOSIS — Z79899 Other long term (current) drug therapy: Secondary | ICD-10-CM | POA: Diagnosis not present

## 2022-12-22 DIAGNOSIS — Z79899 Other long term (current) drug therapy: Secondary | ICD-10-CM | POA: Diagnosis not present

## 2022-12-22 DIAGNOSIS — C9200 Acute myeloblastic leukemia, not having achieved remission: Secondary | ICD-10-CM | POA: Diagnosis not present

## 2022-12-22 DIAGNOSIS — D61818 Other pancytopenia: Secondary | ICD-10-CM | POA: Diagnosis not present

## 2022-12-22 DIAGNOSIS — Z5111 Encounter for antineoplastic chemotherapy: Secondary | ICD-10-CM | POA: Diagnosis not present

## 2022-12-23 DIAGNOSIS — D61818 Other pancytopenia: Secondary | ICD-10-CM | POA: Diagnosis not present

## 2022-12-23 DIAGNOSIS — C9200 Acute myeloblastic leukemia, not having achieved remission: Secondary | ICD-10-CM | POA: Diagnosis not present

## 2022-12-23 DIAGNOSIS — Z5111 Encounter for antineoplastic chemotherapy: Secondary | ICD-10-CM | POA: Diagnosis not present

## 2022-12-23 DIAGNOSIS — Z79899 Other long term (current) drug therapy: Secondary | ICD-10-CM | POA: Diagnosis not present

## 2022-12-24 DIAGNOSIS — Z5111 Encounter for antineoplastic chemotherapy: Secondary | ICD-10-CM | POA: Diagnosis not present

## 2022-12-24 DIAGNOSIS — Z79899 Other long term (current) drug therapy: Secondary | ICD-10-CM | POA: Diagnosis not present

## 2022-12-24 DIAGNOSIS — C9200 Acute myeloblastic leukemia, not having achieved remission: Secondary | ICD-10-CM | POA: Diagnosis not present

## 2022-12-24 DIAGNOSIS — D61818 Other pancytopenia: Secondary | ICD-10-CM | POA: Diagnosis not present

## 2022-12-25 DIAGNOSIS — D61818 Other pancytopenia: Secondary | ICD-10-CM | POA: Diagnosis not present

## 2022-12-25 DIAGNOSIS — C9200 Acute myeloblastic leukemia, not having achieved remission: Secondary | ICD-10-CM | POA: Diagnosis not present

## 2022-12-25 DIAGNOSIS — Z79899 Other long term (current) drug therapy: Secondary | ICD-10-CM | POA: Diagnosis not present

## 2022-12-25 DIAGNOSIS — Z5111 Encounter for antineoplastic chemotherapy: Secondary | ICD-10-CM | POA: Diagnosis not present

## 2022-12-28 DIAGNOSIS — C9200 Acute myeloblastic leukemia, not having achieved remission: Secondary | ICD-10-CM | POA: Diagnosis not present

## 2022-12-28 DIAGNOSIS — Z79899 Other long term (current) drug therapy: Secondary | ICD-10-CM | POA: Diagnosis not present

## 2022-12-28 DIAGNOSIS — D61818 Other pancytopenia: Secondary | ICD-10-CM | POA: Diagnosis not present

## 2022-12-28 DIAGNOSIS — Z5111 Encounter for antineoplastic chemotherapy: Secondary | ICD-10-CM | POA: Diagnosis not present

## 2022-12-31 DIAGNOSIS — D61818 Other pancytopenia: Secondary | ICD-10-CM | POA: Diagnosis not present

## 2022-12-31 DIAGNOSIS — C9200 Acute myeloblastic leukemia, not having achieved remission: Secondary | ICD-10-CM | POA: Diagnosis not present

## 2022-12-31 DIAGNOSIS — Z79899 Other long term (current) drug therapy: Secondary | ICD-10-CM | POA: Diagnosis not present

## 2022-12-31 DIAGNOSIS — Z5111 Encounter for antineoplastic chemotherapy: Secondary | ICD-10-CM | POA: Diagnosis not present

## 2023-01-04 DIAGNOSIS — Z79899 Other long term (current) drug therapy: Secondary | ICD-10-CM | POA: Diagnosis not present

## 2023-01-04 DIAGNOSIS — Z5111 Encounter for antineoplastic chemotherapy: Secondary | ICD-10-CM | POA: Diagnosis not present

## 2023-01-04 DIAGNOSIS — D61818 Other pancytopenia: Secondary | ICD-10-CM | POA: Diagnosis not present

## 2023-01-04 DIAGNOSIS — C9200 Acute myeloblastic leukemia, not having achieved remission: Secondary | ICD-10-CM | POA: Diagnosis not present

## 2023-01-07 DIAGNOSIS — Z5111 Encounter for antineoplastic chemotherapy: Secondary | ICD-10-CM | POA: Diagnosis not present

## 2023-01-07 DIAGNOSIS — D61818 Other pancytopenia: Secondary | ICD-10-CM | POA: Diagnosis not present

## 2023-01-07 DIAGNOSIS — Z79899 Other long term (current) drug therapy: Secondary | ICD-10-CM | POA: Diagnosis not present

## 2023-01-07 DIAGNOSIS — C9200 Acute myeloblastic leukemia, not having achieved remission: Secondary | ICD-10-CM | POA: Diagnosis not present

## 2023-01-11 DIAGNOSIS — D61818 Other pancytopenia: Secondary | ICD-10-CM | POA: Diagnosis not present

## 2023-01-11 DIAGNOSIS — Z5111 Encounter for antineoplastic chemotherapy: Secondary | ICD-10-CM | POA: Diagnosis not present

## 2023-01-11 DIAGNOSIS — Z79899 Other long term (current) drug therapy: Secondary | ICD-10-CM | POA: Diagnosis not present

## 2023-01-11 DIAGNOSIS — C9200 Acute myeloblastic leukemia, not having achieved remission: Secondary | ICD-10-CM | POA: Diagnosis not present

## 2023-01-14 DIAGNOSIS — D61818 Other pancytopenia: Secondary | ICD-10-CM | POA: Diagnosis not present

## 2023-01-14 DIAGNOSIS — Z79899 Other long term (current) drug therapy: Secondary | ICD-10-CM | POA: Diagnosis not present

## 2023-01-14 DIAGNOSIS — C9200 Acute myeloblastic leukemia, not having achieved remission: Secondary | ICD-10-CM | POA: Diagnosis not present

## 2023-01-14 DIAGNOSIS — Z5111 Encounter for antineoplastic chemotherapy: Secondary | ICD-10-CM | POA: Diagnosis not present

## 2023-01-19 DIAGNOSIS — Z79899 Other long term (current) drug therapy: Secondary | ICD-10-CM | POA: Diagnosis not present

## 2023-01-19 DIAGNOSIS — Z5111 Encounter for antineoplastic chemotherapy: Secondary | ICD-10-CM | POA: Diagnosis not present

## 2023-01-19 DIAGNOSIS — D61818 Other pancytopenia: Secondary | ICD-10-CM | POA: Diagnosis not present

## 2023-01-19 DIAGNOSIS — C9200 Acute myeloblastic leukemia, not having achieved remission: Secondary | ICD-10-CM | POA: Diagnosis not present

## 2023-01-22 DIAGNOSIS — D61818 Other pancytopenia: Secondary | ICD-10-CM | POA: Diagnosis not present

## 2023-01-22 DIAGNOSIS — C9200 Acute myeloblastic leukemia, not having achieved remission: Secondary | ICD-10-CM | POA: Diagnosis not present

## 2023-01-22 DIAGNOSIS — Z5111 Encounter for antineoplastic chemotherapy: Secondary | ICD-10-CM | POA: Diagnosis not present

## 2023-01-22 DIAGNOSIS — Z79899 Other long term (current) drug therapy: Secondary | ICD-10-CM | POA: Diagnosis not present

## 2023-01-25 DIAGNOSIS — E8779 Other fluid overload: Secondary | ICD-10-CM | POA: Diagnosis not present

## 2023-01-25 DIAGNOSIS — R059 Cough, unspecified: Secondary | ICD-10-CM | POA: Diagnosis not present

## 2023-01-25 DIAGNOSIS — D709 Neutropenia, unspecified: Secondary | ICD-10-CM | POA: Diagnosis not present

## 2023-01-25 DIAGNOSIS — Z79899 Other long term (current) drug therapy: Secondary | ICD-10-CM | POA: Diagnosis not present

## 2023-01-25 DIAGNOSIS — C9200 Acute myeloblastic leukemia, not having achieved remission: Secondary | ICD-10-CM | POA: Diagnosis not present

## 2023-01-25 DIAGNOSIS — E871 Hypo-osmolality and hyponatremia: Secondary | ICD-10-CM | POA: Diagnosis not present

## 2023-01-25 DIAGNOSIS — J189 Pneumonia, unspecified organism: Secondary | ICD-10-CM | POA: Diagnosis not present

## 2023-01-25 DIAGNOSIS — J90 Pleural effusion, not elsewhere classified: Secondary | ICD-10-CM | POA: Diagnosis not present

## 2023-01-25 DIAGNOSIS — D61818 Other pancytopenia: Secondary | ICD-10-CM | POA: Diagnosis not present

## 2023-01-28 DIAGNOSIS — D61818 Other pancytopenia: Secondary | ICD-10-CM | POA: Diagnosis not present

## 2023-01-28 DIAGNOSIS — J90 Pleural effusion, not elsewhere classified: Secondary | ICD-10-CM | POA: Diagnosis not present

## 2023-01-28 DIAGNOSIS — C9200 Acute myeloblastic leukemia, not having achieved remission: Secondary | ICD-10-CM | POA: Diagnosis not present

## 2023-01-28 DIAGNOSIS — E871 Hypo-osmolality and hyponatremia: Secondary | ICD-10-CM | POA: Diagnosis not present

## 2023-01-28 DIAGNOSIS — Z79899 Other long term (current) drug therapy: Secondary | ICD-10-CM | POA: Diagnosis not present

## 2023-01-28 DIAGNOSIS — E8779 Other fluid overload: Secondary | ICD-10-CM | POA: Diagnosis not present

## 2023-01-28 DIAGNOSIS — D709 Neutropenia, unspecified: Secondary | ICD-10-CM | POA: Diagnosis not present

## 2023-01-28 DIAGNOSIS — R059 Cough, unspecified: Secondary | ICD-10-CM | POA: Diagnosis not present

## 2023-01-28 DIAGNOSIS — J189 Pneumonia, unspecified organism: Secondary | ICD-10-CM | POA: Diagnosis not present

## 2023-01-30 ENCOUNTER — Other Ambulatory Visit (HOSPITAL_COMMUNITY): Payer: Self-pay

## 2023-02-01 ENCOUNTER — Other Ambulatory Visit (HOSPITAL_COMMUNITY): Payer: Self-pay

## 2023-02-01 DIAGNOSIS — D61818 Other pancytopenia: Secondary | ICD-10-CM | POA: Diagnosis not present

## 2023-02-01 DIAGNOSIS — J90 Pleural effusion, not elsewhere classified: Secondary | ICD-10-CM | POA: Diagnosis not present

## 2023-02-01 DIAGNOSIS — E871 Hypo-osmolality and hyponatremia: Secondary | ICD-10-CM | POA: Diagnosis not present

## 2023-02-01 DIAGNOSIS — C9200 Acute myeloblastic leukemia, not having achieved remission: Secondary | ICD-10-CM | POA: Diagnosis not present

## 2023-02-01 DIAGNOSIS — J189 Pneumonia, unspecified organism: Secondary | ICD-10-CM | POA: Diagnosis not present

## 2023-02-01 DIAGNOSIS — E8779 Other fluid overload: Secondary | ICD-10-CM | POA: Diagnosis not present

## 2023-02-01 DIAGNOSIS — R059 Cough, unspecified: Secondary | ICD-10-CM | POA: Diagnosis not present

## 2023-02-01 DIAGNOSIS — Z79899 Other long term (current) drug therapy: Secondary | ICD-10-CM | POA: Diagnosis not present

## 2023-02-01 DIAGNOSIS — D709 Neutropenia, unspecified: Secondary | ICD-10-CM | POA: Diagnosis not present

## 2023-02-02 DIAGNOSIS — C9200 Acute myeloblastic leukemia, not having achieved remission: Secondary | ICD-10-CM | POA: Diagnosis not present

## 2023-02-02 DIAGNOSIS — J189 Pneumonia, unspecified organism: Secondary | ICD-10-CM | POA: Diagnosis not present

## 2023-02-02 DIAGNOSIS — D61818 Other pancytopenia: Secondary | ICD-10-CM | POA: Diagnosis not present

## 2023-02-02 DIAGNOSIS — J90 Pleural effusion, not elsewhere classified: Secondary | ICD-10-CM | POA: Diagnosis not present

## 2023-02-04 DIAGNOSIS — D709 Neutropenia, unspecified: Secondary | ICD-10-CM | POA: Diagnosis not present

## 2023-02-04 DIAGNOSIS — J90 Pleural effusion, not elsewhere classified: Secondary | ICD-10-CM | POA: Diagnosis not present

## 2023-02-04 DIAGNOSIS — C9200 Acute myeloblastic leukemia, not having achieved remission: Secondary | ICD-10-CM | POA: Diagnosis not present

## 2023-02-04 DIAGNOSIS — R059 Cough, unspecified: Secondary | ICD-10-CM | POA: Diagnosis not present

## 2023-02-04 DIAGNOSIS — J189 Pneumonia, unspecified organism: Secondary | ICD-10-CM | POA: Diagnosis not present

## 2023-02-04 DIAGNOSIS — D61818 Other pancytopenia: Secondary | ICD-10-CM | POA: Diagnosis not present

## 2023-02-04 DIAGNOSIS — Z79899 Other long term (current) drug therapy: Secondary | ICD-10-CM | POA: Diagnosis not present

## 2023-02-04 DIAGNOSIS — E871 Hypo-osmolality and hyponatremia: Secondary | ICD-10-CM | POA: Diagnosis not present

## 2023-02-04 DIAGNOSIS — E8779 Other fluid overload: Secondary | ICD-10-CM | POA: Diagnosis not present

## 2023-02-08 DIAGNOSIS — D709 Neutropenia, unspecified: Secondary | ICD-10-CM | POA: Diagnosis not present

## 2023-02-08 DIAGNOSIS — D61818 Other pancytopenia: Secondary | ICD-10-CM | POA: Diagnosis not present

## 2023-02-08 DIAGNOSIS — J90 Pleural effusion, not elsewhere classified: Secondary | ICD-10-CM | POA: Diagnosis not present

## 2023-02-08 DIAGNOSIS — C9200 Acute myeloblastic leukemia, not having achieved remission: Secondary | ICD-10-CM | POA: Diagnosis not present

## 2023-02-08 DIAGNOSIS — Z79899 Other long term (current) drug therapy: Secondary | ICD-10-CM | POA: Diagnosis not present

## 2023-02-08 DIAGNOSIS — J189 Pneumonia, unspecified organism: Secondary | ICD-10-CM | POA: Diagnosis not present

## 2023-02-08 DIAGNOSIS — R059 Cough, unspecified: Secondary | ICD-10-CM | POA: Diagnosis not present

## 2023-02-08 DIAGNOSIS — E871 Hypo-osmolality and hyponatremia: Secondary | ICD-10-CM | POA: Diagnosis not present

## 2023-02-08 DIAGNOSIS — E8779 Other fluid overload: Secondary | ICD-10-CM | POA: Diagnosis not present

## 2023-02-11 ENCOUNTER — Other Ambulatory Visit (HOSPITAL_COMMUNITY): Payer: Self-pay

## 2023-02-11 DIAGNOSIS — J90 Pleural effusion, not elsewhere classified: Secondary | ICD-10-CM | POA: Diagnosis not present

## 2023-02-11 DIAGNOSIS — D709 Neutropenia, unspecified: Secondary | ICD-10-CM | POA: Diagnosis not present

## 2023-02-11 DIAGNOSIS — D61818 Other pancytopenia: Secondary | ICD-10-CM | POA: Diagnosis not present

## 2023-02-11 DIAGNOSIS — E8779 Other fluid overload: Secondary | ICD-10-CM | POA: Diagnosis not present

## 2023-02-11 DIAGNOSIS — E871 Hypo-osmolality and hyponatremia: Secondary | ICD-10-CM | POA: Diagnosis not present

## 2023-02-11 DIAGNOSIS — R059 Cough, unspecified: Secondary | ICD-10-CM | POA: Diagnosis not present

## 2023-02-11 DIAGNOSIS — J189 Pneumonia, unspecified organism: Secondary | ICD-10-CM | POA: Diagnosis not present

## 2023-02-11 DIAGNOSIS — Z79899 Other long term (current) drug therapy: Secondary | ICD-10-CM | POA: Diagnosis not present

## 2023-02-11 DIAGNOSIS — C9200 Acute myeloblastic leukemia, not having achieved remission: Secondary | ICD-10-CM | POA: Diagnosis not present

## 2023-02-15 DIAGNOSIS — E8770 Fluid overload, unspecified: Secondary | ICD-10-CM | POA: Diagnosis not present

## 2023-02-15 DIAGNOSIS — D61810 Antineoplastic chemotherapy induced pancytopenia: Secondary | ICD-10-CM | POA: Diagnosis not present

## 2023-02-15 DIAGNOSIS — I509 Heart failure, unspecified: Secondary | ICD-10-CM | POA: Diagnosis not present

## 2023-02-15 DIAGNOSIS — J189 Pneumonia, unspecified organism: Secondary | ICD-10-CM | POA: Diagnosis not present

## 2023-02-15 DIAGNOSIS — I5023 Acute on chronic systolic (congestive) heart failure: Secondary | ICD-10-CM | POA: Diagnosis not present

## 2023-02-15 DIAGNOSIS — C9200 Acute myeloblastic leukemia, not having achieved remission: Secondary | ICD-10-CM | POA: Diagnosis not present

## 2023-02-15 DIAGNOSIS — R627 Adult failure to thrive: Secondary | ICD-10-CM | POA: Diagnosis not present

## 2023-02-15 DIAGNOSIS — Z66 Do not resuscitate: Secondary | ICD-10-CM | POA: Diagnosis not present

## 2023-02-15 DIAGNOSIS — R531 Weakness: Secondary | ICD-10-CM | POA: Diagnosis not present

## 2023-02-15 DIAGNOSIS — J811 Chronic pulmonary edema: Secondary | ICD-10-CM | POA: Diagnosis not present

## 2023-02-15 DIAGNOSIS — J188 Other pneumonia, unspecified organism: Secondary | ICD-10-CM | POA: Diagnosis not present

## 2023-02-15 DIAGNOSIS — E871 Hypo-osmolality and hyponatremia: Secondary | ICD-10-CM | POA: Diagnosis not present

## 2023-02-15 DIAGNOSIS — D696 Thrombocytopenia, unspecified: Secondary | ICD-10-CM | POA: Diagnosis not present

## 2023-02-15 DIAGNOSIS — J90 Pleural effusion, not elsewhere classified: Secondary | ICD-10-CM | POA: Diagnosis not present

## 2023-02-15 DIAGNOSIS — C9202 Acute myeloblastic leukemia, in relapse: Secondary | ICD-10-CM | POA: Diagnosis not present

## 2023-02-15 DIAGNOSIS — K7460 Unspecified cirrhosis of liver: Secondary | ICD-10-CM | POA: Diagnosis not present

## 2023-02-15 DIAGNOSIS — R918 Other nonspecific abnormal finding of lung field: Secondary | ICD-10-CM | POA: Diagnosis not present

## 2023-02-15 DIAGNOSIS — J159 Unspecified bacterial pneumonia: Secondary | ICD-10-CM | POA: Diagnosis not present

## 2023-02-15 DIAGNOSIS — D709 Neutropenia, unspecified: Secondary | ICD-10-CM | POA: Diagnosis not present

## 2023-02-15 DIAGNOSIS — R5383 Other fatigue: Secondary | ICD-10-CM | POA: Diagnosis not present

## 2023-02-15 DIAGNOSIS — R5381 Other malaise: Secondary | ICD-10-CM | POA: Diagnosis not present

## 2023-02-15 DIAGNOSIS — I459 Conduction disorder, unspecified: Secondary | ICD-10-CM | POA: Diagnosis not present

## 2023-02-15 DIAGNOSIS — D638 Anemia in other chronic diseases classified elsewhere: Secondary | ICD-10-CM | POA: Diagnosis not present

## 2023-02-15 DIAGNOSIS — E861 Hypovolemia: Secondary | ICD-10-CM | POA: Diagnosis not present

## 2023-02-15 DIAGNOSIS — R410 Disorientation, unspecified: Secondary | ICD-10-CM | POA: Diagnosis not present

## 2023-02-26 DIAGNOSIS — D7581 Myelofibrosis: Secondary | ICD-10-CM | POA: Diagnosis not present

## 2023-02-26 DIAGNOSIS — Z86718 Personal history of other venous thrombosis and embolism: Secondary | ICD-10-CM | POA: Diagnosis not present

## 2023-02-26 DIAGNOSIS — K529 Noninfective gastroenteritis and colitis, unspecified: Secondary | ICD-10-CM | POA: Diagnosis not present

## 2023-02-26 DIAGNOSIS — R051 Acute cough: Secondary | ICD-10-CM | POA: Diagnosis not present

## 2023-02-26 DIAGNOSIS — G8911 Acute pain due to trauma: Secondary | ICD-10-CM | POA: Diagnosis not present

## 2023-02-26 DIAGNOSIS — A419 Sepsis, unspecified organism: Secondary | ICD-10-CM | POA: Diagnosis not present

## 2023-02-26 DIAGNOSIS — R0902 Hypoxemia: Secondary | ICD-10-CM | POA: Diagnosis not present

## 2023-02-26 DIAGNOSIS — N179 Acute kidney failure, unspecified: Secondary | ICD-10-CM | POA: Diagnosis not present

## 2023-02-26 DIAGNOSIS — C9202 Acute myeloblastic leukemia, in relapse: Secondary | ICD-10-CM | POA: Diagnosis not present

## 2023-02-26 DIAGNOSIS — M7989 Other specified soft tissue disorders: Secondary | ICD-10-CM | POA: Diagnosis not present

## 2023-02-26 DIAGNOSIS — A4151 Sepsis due to Escherichia coli [E. coli]: Secondary | ICD-10-CM | POA: Diagnosis not present

## 2023-02-26 DIAGNOSIS — E43 Unspecified severe protein-calorie malnutrition: Secondary | ICD-10-CM | POA: Diagnosis not present

## 2023-02-26 DIAGNOSIS — K7460 Unspecified cirrhosis of liver: Secondary | ICD-10-CM | POA: Diagnosis not present

## 2023-02-26 DIAGNOSIS — R6521 Severe sepsis with septic shock: Secondary | ICD-10-CM | POA: Diagnosis not present

## 2023-02-26 DIAGNOSIS — Z9181 History of falling: Secondary | ICD-10-CM | POA: Diagnosis not present

## 2023-02-26 DIAGNOSIS — G9341 Metabolic encephalopathy: Secondary | ICD-10-CM | POA: Diagnosis not present

## 2023-02-26 DIAGNOSIS — Z452 Encounter for adjustment and management of vascular access device: Secondary | ICD-10-CM | POA: Diagnosis not present

## 2023-02-26 DIAGNOSIS — I959 Hypotension, unspecified: Secondary | ICD-10-CM | POA: Diagnosis not present

## 2023-02-26 DIAGNOSIS — A040 Enteropathogenic Escherichia coli infection: Secondary | ICD-10-CM | POA: Diagnosis not present

## 2023-02-26 DIAGNOSIS — Z95828 Presence of other vascular implants and grafts: Secondary | ICD-10-CM | POA: Diagnosis not present

## 2023-02-26 DIAGNOSIS — R5081 Fever presenting with conditions classified elsewhere: Secondary | ICD-10-CM | POA: Diagnosis not present

## 2023-02-26 DIAGNOSIS — K219 Gastro-esophageal reflux disease without esophagitis: Secondary | ICD-10-CM | POA: Diagnosis not present

## 2023-02-26 DIAGNOSIS — E871 Hypo-osmolality and hyponatremia: Secondary | ICD-10-CM | POA: Diagnosis not present

## 2023-02-26 DIAGNOSIS — D709 Neutropenia, unspecified: Secondary | ICD-10-CM | POA: Diagnosis not present

## 2023-02-26 DIAGNOSIS — R410 Disorientation, unspecified: Secondary | ICD-10-CM | POA: Diagnosis not present

## 2023-02-26 DIAGNOSIS — R6520 Severe sepsis without septic shock: Secondary | ICD-10-CM | POA: Diagnosis not present

## 2023-02-26 DIAGNOSIS — R0689 Other abnormalities of breathing: Secondary | ICD-10-CM | POA: Diagnosis not present

## 2023-02-26 DIAGNOSIS — R531 Weakness: Secondary | ICD-10-CM | POA: Diagnosis not present

## 2023-02-26 DIAGNOSIS — R339 Retention of urine, unspecified: Secondary | ICD-10-CM | POA: Diagnosis not present

## 2023-02-26 DIAGNOSIS — R9431 Abnormal electrocardiogram [ECG] [EKG]: Secondary | ICD-10-CM | POA: Diagnosis not present

## 2023-02-26 DIAGNOSIS — C9200 Acute myeloblastic leukemia, not having achieved remission: Secondary | ICD-10-CM | POA: Diagnosis not present

## 2023-02-26 DIAGNOSIS — I493 Ventricular premature depolarization: Secondary | ICD-10-CM | POA: Diagnosis not present

## 2023-02-26 DIAGNOSIS — D61810 Antineoplastic chemotherapy induced pancytopenia: Secondary | ICD-10-CM | POA: Diagnosis not present

## 2023-02-26 DIAGNOSIS — R918 Other nonspecific abnormal finding of lung field: Secondary | ICD-10-CM | POA: Diagnosis not present

## 2023-02-26 DIAGNOSIS — D45 Polycythemia vera: Secondary | ICD-10-CM | POA: Diagnosis not present

## 2023-02-26 DIAGNOSIS — R4182 Altered mental status, unspecified: Secondary | ICD-10-CM | POA: Diagnosis not present

## 2023-02-26 DIAGNOSIS — J90 Pleural effusion, not elsewhere classified: Secondary | ICD-10-CM | POA: Diagnosis not present

## 2023-02-26 DIAGNOSIS — M542 Cervicalgia: Secondary | ICD-10-CM | POA: Diagnosis not present

## 2023-02-26 DIAGNOSIS — R600 Localized edema: Secondary | ICD-10-CM | POA: Diagnosis not present

## 2023-02-26 DIAGNOSIS — D61818 Other pancytopenia: Secondary | ICD-10-CM | POA: Diagnosis not present

## 2023-02-26 DIAGNOSIS — J810 Acute pulmonary edema: Secondary | ICD-10-CM | POA: Diagnosis not present

## 2023-02-26 DIAGNOSIS — Z743 Need for continuous supervision: Secondary | ICD-10-CM | POA: Diagnosis not present

## 2023-02-26 DIAGNOSIS — I2699 Other pulmonary embolism without acute cor pulmonale: Secondary | ICD-10-CM | POA: Diagnosis not present

## 2023-02-26 DIAGNOSIS — T451X5A Adverse effect of antineoplastic and immunosuppressive drugs, initial encounter: Secondary | ICD-10-CM | POA: Diagnosis not present

## 2023-02-26 DIAGNOSIS — Z8619 Personal history of other infectious and parasitic diseases: Secondary | ICD-10-CM | POA: Diagnosis not present

## 2023-03-02 ENCOUNTER — Other Ambulatory Visit (HOSPITAL_COMMUNITY): Payer: Self-pay

## 2023-03-10 ENCOUNTER — Other Ambulatory Visit (HOSPITAL_COMMUNITY): Payer: Self-pay

## 2023-03-15 ENCOUNTER — Telehealth: Payer: Self-pay | Admitting: *Deleted

## 2023-03-15 NOTE — Telephone Encounter (Signed)
Per scheduling message Benjamin [name] - Called again and patient is still in the ICU @ [facility] for [name].

## 2023-03-15 NOTE — Telephone Encounter (Signed)
Per scheduling message [name] - called and lvm for a callback to schedule a f/u appointment and possibly a transfusion.

## 2023-03-18 ENCOUNTER — Other Ambulatory Visit (HOSPITAL_COMMUNITY): Payer: Self-pay

## 2023-03-19 DIAGNOSIS — C9200 Acute myeloblastic leukemia, not having achieved remission: Secondary | ICD-10-CM | POA: Diagnosis not present

## 2023-03-19 DIAGNOSIS — J90 Pleural effusion, not elsewhere classified: Secondary | ICD-10-CM | POA: Diagnosis not present

## 2023-03-19 DIAGNOSIS — D45 Polycythemia vera: Secondary | ICD-10-CM | POA: Diagnosis not present

## 2023-03-19 DIAGNOSIS — A419 Sepsis, unspecified organism: Secondary | ICD-10-CM | POA: Diagnosis not present

## 2023-03-19 DIAGNOSIS — N179 Acute kidney failure, unspecified: Secondary | ICD-10-CM | POA: Diagnosis not present

## 2023-03-19 DIAGNOSIS — K219 Gastro-esophageal reflux disease without esophagitis: Secondary | ICD-10-CM | POA: Diagnosis not present

## 2023-03-19 DIAGNOSIS — E871 Hypo-osmolality and hyponatremia: Secondary | ICD-10-CM | POA: Diagnosis not present

## 2023-03-19 DIAGNOSIS — R531 Weakness: Secondary | ICD-10-CM | POA: Diagnosis not present

## 2023-03-19 DIAGNOSIS — Z743 Need for continuous supervision: Secondary | ICD-10-CM | POA: Diagnosis not present

## 2023-03-19 DIAGNOSIS — Z9181 History of falling: Secondary | ICD-10-CM | POA: Diagnosis not present

## 2023-03-19 DIAGNOSIS — I959 Hypotension, unspecified: Secondary | ICD-10-CM | POA: Diagnosis not present

## 2023-03-19 DIAGNOSIS — D7581 Myelofibrosis: Secondary | ICD-10-CM | POA: Diagnosis not present

## 2023-03-19 DIAGNOSIS — K7460 Unspecified cirrhosis of liver: Secondary | ICD-10-CM | POA: Diagnosis not present

## 2023-03-19 DIAGNOSIS — D61818 Other pancytopenia: Secondary | ICD-10-CM | POA: Diagnosis not present

## 2023-03-19 DIAGNOSIS — Z452 Encounter for adjustment and management of vascular access device: Secondary | ICD-10-CM | POA: Diagnosis not present

## 2023-03-19 DIAGNOSIS — Z86718 Personal history of other venous thrombosis and embolism: Secondary | ICD-10-CM | POA: Diagnosis not present

## 2023-03-19 DIAGNOSIS — C9202 Acute myeloblastic leukemia, in relapse: Secondary | ICD-10-CM | POA: Diagnosis not present

## 2023-03-19 DIAGNOSIS — J810 Acute pulmonary edema: Secondary | ICD-10-CM | POA: Diagnosis not present

## 2023-03-19 DIAGNOSIS — R6520 Severe sepsis without septic shock: Secondary | ICD-10-CM | POA: Diagnosis not present

## 2023-03-25 DEATH — deceased

## 2023-04-03 ENCOUNTER — Other Ambulatory Visit (HOSPITAL_COMMUNITY): Payer: Self-pay

## 2023-07-01 ENCOUNTER — Other Ambulatory Visit (HOSPITAL_COMMUNITY): Payer: Self-pay

## 2023-08-19 ENCOUNTER — Other Ambulatory Visit (HOSPITAL_COMMUNITY): Payer: Self-pay

## 2023-10-11 ENCOUNTER — Other Ambulatory Visit (HOSPITAL_COMMUNITY): Payer: Self-pay

## 2023-10-15 ENCOUNTER — Other Ambulatory Visit (HOSPITAL_COMMUNITY): Payer: Self-pay

## 2023-10-19 ENCOUNTER — Other Ambulatory Visit (HOSPITAL_COMMUNITY): Payer: Self-pay

## 2023-12-22 ENCOUNTER — Other Ambulatory Visit (HOSPITAL_COMMUNITY): Payer: Self-pay

## 2023-12-23 ENCOUNTER — Other Ambulatory Visit (HOSPITAL_COMMUNITY): Payer: Self-pay

## 2024-02-10 ENCOUNTER — Other Ambulatory Visit (HOSPITAL_COMMUNITY): Payer: Self-pay

## 2024-04-12 ENCOUNTER — Other Ambulatory Visit (HOSPITAL_COMMUNITY): Payer: Self-pay

## 2024-04-14 ENCOUNTER — Other Ambulatory Visit (HOSPITAL_COMMUNITY): Payer: Self-pay

## 2024-04-18 ENCOUNTER — Other Ambulatory Visit (HOSPITAL_COMMUNITY): Payer: Self-pay
